# Patient Record
Sex: Female | Born: 1935 | Race: White | Hispanic: No | Marital: Married | Smoking: Never smoker
Health system: Southern US, Community
[De-identification: ages and names within clinical notes are randomized; demographics above are authoritative.]

## PROBLEM LIST (undated history)

## (undated) DIAGNOSIS — R51 Headache: Secondary | ICD-10-CM

## (undated) DIAGNOSIS — K219 Gastro-esophageal reflux disease without esophagitis: Secondary | ICD-10-CM

## (undated) DIAGNOSIS — IMO0001 Reserved for inherently not codable concepts without codable children: Secondary | ICD-10-CM

## (undated) DIAGNOSIS — E872 Acidosis: Secondary | ICD-10-CM

## (undated) DIAGNOSIS — H3530 Unspecified macular degeneration: Secondary | ICD-10-CM

## (undated) DIAGNOSIS — Z8719 Personal history of other diseases of the digestive system: Secondary | ICD-10-CM

## (undated) DIAGNOSIS — E162 Hypoglycemia, unspecified: Secondary | ICD-10-CM

## (undated) DIAGNOSIS — I10 Essential (primary) hypertension: Secondary | ICD-10-CM

## (undated) DIAGNOSIS — E039 Hypothyroidism, unspecified: Secondary | ICD-10-CM

## (undated) DIAGNOSIS — C73 Malignant neoplasm of thyroid gland: Secondary | ICD-10-CM

## (undated) DIAGNOSIS — G8929 Other chronic pain: Secondary | ICD-10-CM

## (undated) DIAGNOSIS — D649 Anemia, unspecified: Secondary | ICD-10-CM

## (undated) DIAGNOSIS — Z5189 Encounter for other specified aftercare: Secondary | ICD-10-CM

## (undated) DIAGNOSIS — I209 Angina pectoris, unspecified: Secondary | ICD-10-CM

## (undated) DIAGNOSIS — M1990 Unspecified osteoarthritis, unspecified site: Secondary | ICD-10-CM

## (undated) DIAGNOSIS — R0609 Other forms of dyspnea: Secondary | ICD-10-CM

## (undated) DIAGNOSIS — R0602 Shortness of breath: Secondary | ICD-10-CM

## (undated) DIAGNOSIS — T148XXA Other injury of unspecified body region, initial encounter: Secondary | ICD-10-CM

## (undated) DIAGNOSIS — M549 Dorsalgia, unspecified: Secondary | ICD-10-CM

## (undated) DIAGNOSIS — J189 Pneumonia, unspecified organism: Secondary | ICD-10-CM

## (undated) DIAGNOSIS — M869 Osteomyelitis, unspecified: Secondary | ICD-10-CM

## (undated) HISTORY — PX: TRIGGER FINGER RELEASE: SHX641

## (undated) HISTORY — PX: SHOULDER ARTHROSCOPY W/ ROTATOR CUFF REPAIR: SHX2400

## (undated) HISTORY — PX: BACK SURGERY: SHX140

## (undated) HISTORY — PX: FRACTURE SURGERY: SHX138

## (undated) HISTORY — PX: APPENDECTOMY: SHX54

## (undated) HISTORY — PX: DILATION AND CURETTAGE OF UTERUS: SHX78

## (undated) HISTORY — PX: CARPAL TUNNEL RELEASE: SHX101

## (undated) HISTORY — PX: CATARACT EXTRACTION W/ INTRAOCULAR LENS  IMPLANT, BILATERAL: SHX1307

## (undated) HISTORY — PX: OTHER SURGICAL HISTORY: SHX169

## (undated) HISTORY — PX: JOINT REPLACEMENT: SHX530

## (undated) HISTORY — PX: TONSILLECTOMY AND ADENOIDECTOMY: SUR1326

---

## 1961-09-03 HISTORY — PX: ABDOMINAL HYSTERECTOMY: SHX81

## 1984-09-03 DIAGNOSIS — C73 Malignant neoplasm of thyroid gland: Secondary | ICD-10-CM

## 1984-09-03 HISTORY — DX: Malignant neoplasm of thyroid gland: C73

## 1984-09-03 HISTORY — PX: THYROIDECTOMY: SHX17

## 1998-08-10 ENCOUNTER — Encounter: Admission: RE | Admit: 1998-08-10 | Discharge: 1998-09-08 | Payer: Self-pay | Admitting: Orthopedic Surgery

## 1999-03-30 ENCOUNTER — Inpatient Hospital Stay (HOSPITAL_COMMUNITY): Admission: EM | Admit: 1999-03-30 | Discharge: 1999-04-07 | Payer: Self-pay | Admitting: Internal Medicine

## 1999-04-01 ENCOUNTER — Encounter: Payer: Self-pay | Admitting: Endocrinology

## 1999-04-24 ENCOUNTER — Encounter: Admission: RE | Admit: 1999-04-24 | Discharge: 1999-07-23 | Payer: Self-pay | Admitting: Anesthesiology

## 2000-07-05 ENCOUNTER — Encounter: Admission: RE | Admit: 2000-07-05 | Discharge: 2000-07-05 | Payer: Self-pay | Admitting: Orthopedic Surgery

## 2000-07-05 ENCOUNTER — Encounter: Payer: Self-pay | Admitting: Orthopedic Surgery

## 2000-09-25 ENCOUNTER — Ambulatory Visit (HOSPITAL_COMMUNITY): Admission: RE | Admit: 2000-09-25 | Discharge: 2000-09-25 | Payer: Self-pay | Admitting: Neurosurgery

## 2000-09-25 ENCOUNTER — Encounter: Payer: Self-pay | Admitting: Neurosurgery

## 2004-03-10 ENCOUNTER — Inpatient Hospital Stay (HOSPITAL_COMMUNITY): Admission: RE | Admit: 2004-03-10 | Discharge: 2004-03-14 | Payer: Self-pay | Admitting: Orthopedic Surgery

## 2004-03-19 ENCOUNTER — Emergency Department (HOSPITAL_COMMUNITY): Admission: EM | Admit: 2004-03-19 | Discharge: 2004-03-19 | Payer: Self-pay | Admitting: Emergency Medicine

## 2004-04-24 ENCOUNTER — Encounter: Admission: RE | Admit: 2004-04-24 | Discharge: 2004-05-18 | Payer: Self-pay | Admitting: Orthopedic Surgery

## 2005-02-12 ENCOUNTER — Encounter: Admission: RE | Admit: 2005-02-12 | Discharge: 2005-02-12 | Payer: Self-pay | Admitting: Orthopedic Surgery

## 2005-03-09 ENCOUNTER — Ambulatory Visit: Payer: Self-pay | Admitting: Internal Medicine

## 2005-03-09 ENCOUNTER — Inpatient Hospital Stay (HOSPITAL_COMMUNITY): Admission: AD | Admit: 2005-03-09 | Discharge: 2005-03-13 | Payer: Self-pay | Admitting: Orthopedic Surgery

## 2005-03-10 ENCOUNTER — Encounter (INDEPENDENT_AMBULATORY_CARE_PROVIDER_SITE_OTHER): Payer: Self-pay | Admitting: *Deleted

## 2005-04-13 ENCOUNTER — Ambulatory Visit: Payer: Self-pay | Admitting: Infectious Diseases

## 2005-05-17 ENCOUNTER — Encounter: Admission: RE | Admit: 2005-05-17 | Discharge: 2005-05-17 | Payer: Self-pay | Admitting: Orthopedic Surgery

## 2005-06-06 ENCOUNTER — Encounter: Admission: RE | Admit: 2005-06-06 | Discharge: 2005-06-06 | Payer: Self-pay | Admitting: Orthopedic Surgery

## 2005-06-14 ENCOUNTER — Ambulatory Visit: Payer: Self-pay | Admitting: Infectious Diseases

## 2005-12-28 ENCOUNTER — Inpatient Hospital Stay (HOSPITAL_COMMUNITY): Admission: RE | Admit: 2005-12-28 | Discharge: 2005-12-29 | Payer: Self-pay | Admitting: Neurosurgery

## 2006-03-09 ENCOUNTER — Encounter: Admission: RE | Admit: 2006-03-09 | Discharge: 2006-03-09 | Payer: Self-pay | Admitting: Neurosurgery

## 2006-09-03 DIAGNOSIS — T148XXA Other injury of unspecified body region, initial encounter: Secondary | ICD-10-CM

## 2006-09-03 HISTORY — DX: Other injury of unspecified body region, initial encounter: T14.8XXA

## 2006-09-23 ENCOUNTER — Encounter: Admission: RE | Admit: 2006-09-23 | Discharge: 2006-09-23 | Payer: Self-pay | Admitting: Orthopedic Surgery

## 2006-11-18 ENCOUNTER — Encounter: Admission: RE | Admit: 2006-11-18 | Discharge: 2006-12-17 | Payer: Self-pay | Admitting: Orthopedic Surgery

## 2007-01-28 ENCOUNTER — Ambulatory Visit: Payer: Self-pay | Admitting: Physical Medicine & Rehabilitation

## 2007-01-28 ENCOUNTER — Encounter
Admission: RE | Admit: 2007-01-28 | Discharge: 2007-04-28 | Payer: Self-pay | Admitting: Physical Medicine & Rehabilitation

## 2007-02-10 ENCOUNTER — Encounter: Admission: RE | Admit: 2007-02-10 | Discharge: 2007-02-10 | Payer: Self-pay | Admitting: Cardiology

## 2007-02-17 ENCOUNTER — Inpatient Hospital Stay (HOSPITAL_COMMUNITY): Admission: AD | Admit: 2007-02-17 | Discharge: 2007-02-18 | Payer: Self-pay | Admitting: Cardiology

## 2007-02-20 ENCOUNTER — Encounter: Admission: RE | Admit: 2007-02-20 | Discharge: 2007-02-20 | Payer: Self-pay | Admitting: Neurosurgery

## 2007-03-14 ENCOUNTER — Ambulatory Visit: Payer: Self-pay | Admitting: Physical Medicine & Rehabilitation

## 2007-04-29 ENCOUNTER — Inpatient Hospital Stay (HOSPITAL_COMMUNITY): Admission: RE | Admit: 2007-04-29 | Discharge: 2007-05-02 | Payer: Self-pay | Admitting: Neurosurgery

## 2007-06-02 ENCOUNTER — Encounter
Admission: RE | Admit: 2007-06-02 | Discharge: 2007-06-04 | Payer: Self-pay | Admitting: Physical Medicine & Rehabilitation

## 2007-06-02 ENCOUNTER — Ambulatory Visit: Payer: Self-pay | Admitting: Physical Medicine & Rehabilitation

## 2007-08-21 ENCOUNTER — Encounter
Admission: RE | Admit: 2007-08-21 | Discharge: 2007-08-22 | Payer: Self-pay | Admitting: Physical Medicine & Rehabilitation

## 2007-08-21 ENCOUNTER — Ambulatory Visit: Payer: Self-pay | Admitting: Physical Medicine & Rehabilitation

## 2007-10-06 ENCOUNTER — Encounter: Admission: RE | Admit: 2007-10-06 | Discharge: 2007-10-06 | Payer: Self-pay | Admitting: Neurosurgery

## 2007-10-16 ENCOUNTER — Ambulatory Visit (HOSPITAL_COMMUNITY): Admission: RE | Admit: 2007-10-16 | Discharge: 2007-10-16 | Payer: Self-pay | Admitting: General Surgery

## 2007-10-22 ENCOUNTER — Ambulatory Visit (HOSPITAL_COMMUNITY): Admission: RE | Admit: 2007-10-22 | Discharge: 2007-10-22 | Payer: Self-pay | Admitting: Surgery

## 2007-11-21 ENCOUNTER — Ambulatory Visit: Payer: Self-pay | Admitting: Vascular Surgery

## 2007-12-10 ENCOUNTER — Encounter
Admission: RE | Admit: 2007-12-10 | Discharge: 2007-12-15 | Payer: Self-pay | Admitting: Physical Medicine & Rehabilitation

## 2007-12-15 ENCOUNTER — Ambulatory Visit: Payer: Self-pay | Admitting: Physical Medicine & Rehabilitation

## 2008-04-08 ENCOUNTER — Encounter
Admission: RE | Admit: 2008-04-08 | Discharge: 2008-07-05 | Payer: Self-pay | Admitting: Physical Medicine & Rehabilitation

## 2008-04-12 ENCOUNTER — Ambulatory Visit: Payer: Self-pay | Admitting: Physical Medicine & Rehabilitation

## 2008-07-05 ENCOUNTER — Ambulatory Visit: Payer: Self-pay | Admitting: Physical Medicine & Rehabilitation

## 2008-09-24 ENCOUNTER — Encounter
Admission: RE | Admit: 2008-09-24 | Discharge: 2008-10-20 | Payer: Self-pay | Admitting: Physical Medicine & Rehabilitation

## 2008-09-27 ENCOUNTER — Ambulatory Visit: Payer: Self-pay | Admitting: Physical Medicine & Rehabilitation

## 2008-10-20 ENCOUNTER — Encounter
Admission: RE | Admit: 2008-10-20 | Discharge: 2008-10-27 | Payer: Self-pay | Admitting: Physical Medicine & Rehabilitation

## 2008-10-27 ENCOUNTER — Ambulatory Visit: Payer: Self-pay | Admitting: Physical Medicine & Rehabilitation

## 2008-12-01 ENCOUNTER — Encounter: Admission: RE | Admit: 2008-12-01 | Discharge: 2008-12-01 | Payer: Self-pay | Admitting: Orthopaedic Surgery

## 2010-01-01 DIAGNOSIS — J189 Pneumonia, unspecified organism: Secondary | ICD-10-CM

## 2010-01-01 HISTORY — DX: Pneumonia, unspecified organism: J18.9

## 2011-01-01 ENCOUNTER — Telehealth: Payer: Self-pay | Admitting: Cardiology

## 2011-01-01 NOTE — Telephone Encounter (Signed)
Patient is going to have total knee replacement and her orthopaedic surgeon wants her to have pre-surgical/cardiac clearance.  Dr. Deborah Chalk does not have any openings prior to 01/12/11 and she says that she needs it sooner than that.

## 2011-01-01 NOTE — Telephone Encounter (Signed)
RN set pt up to see Dr. Deborah Chalk on 01/04/11 for cardiac clearance for surgery.  Pt notified.

## 2011-01-04 ENCOUNTER — Encounter: Payer: Self-pay | Admitting: Cardiology

## 2011-01-04 ENCOUNTER — Ambulatory Visit (INDEPENDENT_AMBULATORY_CARE_PROVIDER_SITE_OTHER): Payer: Medicare Other | Admitting: Cardiology

## 2011-01-04 VITALS — BP 116/56 | HR 58 | Ht 63.5 in | Wt 145.0 lb

## 2011-01-04 DIAGNOSIS — Z01818 Encounter for other preprocedural examination: Secondary | ICD-10-CM

## 2011-01-04 DIAGNOSIS — I10 Essential (primary) hypertension: Secondary | ICD-10-CM

## 2011-01-04 NOTE — Progress Notes (Signed)
Subjective:   [name] seen today for preoperative visit. She has a planned right knee surgery by Dr. Madelon Lips. In general she has been doing well otherwise. She had cardiac catheterization in June of 2008 which showed normal coronary arteries and normal left ventricular function. She's had a history of hypertension as been well-controlled. She has history of chronic lower extremity edema, previous thyroidectomy in 1963 and 68, and previous abdominal hysterectomy. She has had other orthopedic procedures. In general, she is doing well. She did have back surgery in 2000 at 12 and tolerated that surgery well. EKG today is normal  No current outpatient prescriptions on file.    Not on File  There is no problem list on file for this patient.   History  Smoking status  . Never Smoker   Smokeless tobacco  . Never Used    History  Alcohol Use No    No family history on file.  Review of Systems:   The patient denies any heat or cold intolerance.  No weight gain or weight loss.  The patient denies headaches or blurry vision.  There is no cough or sputum production.  The patient denies dizziness.  There is no hematuria or hematochezia.  The patient denies any muscle aches or arthritis.  The patient denies any rash.  The patient denies frequent falling or instability.  There is no history of depression or anxiety.  All other systems were reviewed and are negative.   Physical Exam:   Weight is 145. Blood pressure is 116/56 sitting, heart rate 58.The head is normocephalic and atraumatic.  Pupils are equally round and reactive to light.  Sclerae nonicteric.  Conjunctiva is clear.  Oropharynx is unremarkable.  There's adequate oral airway.  Neck is supple there are no masses.  Thyroid is not enlarged.  There is no lymphadenopathy.  Lungs are clear.  Chest is symmetric.  Heart shows a regular rate and rhythm.  S1 and S2 are normal.  There is no murmur click or gallop.  Abdomen is soft normal bowel  sounds.  There is no organomegaly.  Genital and rectal deferred.  Extremities are without edema.  Peripheral pulses are adequate.  Neurologically intact.  Full range of motion.  The patient is not depressed.  Skin is warm and dry.  Assessment / Plan:

## 2011-01-05 ENCOUNTER — Encounter: Payer: Self-pay | Admitting: Cardiology

## 2011-01-05 DIAGNOSIS — I10 Essential (primary) hypertension: Secondary | ICD-10-CM | POA: Insufficient documentation

## 2011-01-05 NOTE — Assessment & Plan Note (Signed)
Blood pressure readings are well controlled. She should be a good candidate for planned knee surgery. Forms were filled out and will be faxed to Dr. Madelon Lips . Surgical clearance for anesthesia is given.

## 2011-01-16 NOTE — Group Therapy Note (Signed)
REFERRAL:  Dr. Madelon Lips.   PURPOSE OF EVALUATION:  Evaluating to treat chronic back and leg pain.   HISTORY OF PRESENT ILLNESS:  Ms. Hilyer is a 77 year old female referred  to this office by Dr. Madelon Lips for chronic pain management, mostly  regarding her back and leg pain.  The patient has a significant history  of full thickness tear of her right rotator cuff.  She has had  arthroscopic surgery for that in March 2008 and that apparently went  well.   Her problems involving her back and legs started after a left total knee  replacement in 2005.  She reports that she has developed pain in the  back side of her left leg after surgery and a repeat surgery was done in  2006 when she had her knee cleaned out.  She reports being on  antibiotics for 4 months at that time.   The patient followed up with Dr. Channing Mutters, her neurosurgeon in 2007.  At that  time a MRI scan had shown spinal stenosis and Dr. Channing Mutters did spinal surgery  for the stenosis, but did not do a fusion and that was in 2007.  She  reports that this surgery for her back did help, but she has had  followup with Dr. Channing Mutters and has had persistent back pain along with the  left leg pain as noted above.  She reports that those are the main two  areas of her pain at the present time.   The patient reports having had extensive therapy for her back and leg  pain along with injections without improvement.  She reports that she  had been rotating Vicodin and Percocet in the past and was getting  relief with the Percocet.  She reports that she uses that occasionally  more than the Vicodin that she has had in the past.  She reports that  she takes the Percocet at approximately once in the morning and possibly  1 or 2 more times throughout the day.  She reports that no surgery is  planned for her back.  She complains of the pain medicine giving her  some relieve used approximately 2-4 tablets per day.  She complains of  spasms of her leg along with  pain across the low back.  She reports  stretching gets her no benefit.  She is unable to use muscle relaxants  related to glaucoma, especially taking Flexeril.  She reports some  constipation with the narcotic medication.   MEDICATIONS:  1. Synthroid 100 mcg daily.  2. Inderal 20 mg daily.  3. Maxzide 25 mg daily.  4. Potassium 10 mEq daily.  5. Percocet 5/325 mg 1 tablet 2-3 times a day.  6. Laxatives p.r.n.  7. Nitrostat 0.4 mg p.r.n.  8. Xalatan eye drops daily.   REVIEW OF SYSTEMS:  Positive for low blood sugar, weight gain,  constipation, limb swelling, urinary retention and abdominal pain.   PAST MEDICAL HISTORY:  1. History of left total knee replacement 2005.  2. History of lumbar spinal stenosis surgery in 2007.  3. Advanced glaucoma.  4. Hypertension.  5. Hypothyroidism after thyroid surgery for a thyroid cancer.  6. History of hypoglycemia.   ALLERGIES:  PENICILLIN, TETRACYCLINE, ATIVAN, DEMEROL.   SOCIAL HISTORY:  The patient is married with one grown daughter.  She  does not use alcohol or tobacco.  She had previously worked in a [employer] until 1969 and has been retired for several years.   PHYSICAL EXAMINATION:  A well appearing elderly adult female in mild to  moderate acute discomfort. Blood pressure 154/63 with a pulse of 63,  respiratory rate 18 and a O2 saturation 99% on room air.  Height was 5  feet 5 inches, weight 150 pounds.  The patient is able to ambulate  without any assisted device.  Upper extremity range of motion was full  with only mild complaints of pain in the right shoulder.  Examination of  her lumbar spine showed decreased lumbar flexion, extension and lateral  bending and rotation.  She has a well healed scar on her right shoulder.  Upper extremity exam showed 4+/5 strength throughout.  Bulk and tone  were normal.  Lower extremity exam showed 4+/5 strength in hip flexion,  knee extension and ankle dorsiflexion.  In the supine  position, straight  leg raise was negative bilaterally.  Hip range of motion was normal  bilaterally.   IMPRESSION:  1. History of spinal stenosis with subsequent lumbar surgery.  2. History of left total knee replacement with persistent left leg      pain.   In the office today we did decide to continue her Percocet and actually  allow her to use 10/325 mg strength one tablet q.i.d. p.r.n.  She will  continue to use it only as absolutely necessary.  Hopefully she will get  better relief with the 10 mg strength compared to the 5 mg strength.  I  feel confident that she will not abuse the medication.  She understands  that all medicines need to be prescribed through this office.  We will  plan on seeing her in followup in approximately one months time and  adjust medicines as necessary at that point.  We have also given her  samples of Lidoderm patches 5% to be applied to the back on 12 hours and  off 12 hours daily.  We will see how she responds to the samples and  then we will refill those if she gets benefit.           ______________________________  Ellwood Dense, M.D.     DC/MedQ  D:  01/30/2007 09:47:48  T:  01/30/2007 10:25:16  Job #:  [id number]   cc:   Dyke Brackett, M.D.  Fax: [phone]

## 2011-01-16 NOTE — Discharge Summary (Signed)
[name], [name]               ACCOUNT NO.:  [id number]   MEDICAL RECORD NO.:  [id number]          PATIENT TYPE:  INP   LOCATION:  5153                         FACILITY:  [facility]   PHYSICIAN:  Payton Doughty, M.D.      DATE OF BIRTH:  1925/04/19   DATE OF ADMISSION:  04/29/2007  DATE OF DISCHARGE:  05/02/2007                               DISCHARGE SUMMARY   ADMITTING DIAGNOSIS:  Spondylosis, L3-4.   DISCHARGE DIAGNOSES:  1. Spondylosis, L3-4.  2. Bilateral pars fractures at L3.   COMPLICATION:  None.   PROCEDURE:  L3-4 fusion.   SERVICE:  Neurosurgery.   BODY OF TEXT:  This is a 77 year old lady who has had a prior  decompression at 3-4, did well for a while and after a fall, had  increasing pain down both legs.  She was admitted for fusion.   MEDICAL HISTORY:  Benign.   PHYSICAL EXAMINATION:  General exam was intact.  Neurologic exam was  intact with a lot of pain when she got up.   HOSPITAL COURSE:  She was admitted after ascertainment of normal  laboratory values and underwent a lumbar fusion at 3-4.  Intraoperatively it was found that she had bilateral fracture of the  pars interarticularis.  Following complete decompression and  stabilization postoperatively, her lower extremities pain was gone.  She  had an appropriate amount of incisional back pain.  She spent 2 days on  a PCA.  Once the PCA was stopped, she was on oral medications, eating  and voiding normally, doing well in physical therapy.  She is being  discharged home to the care of her family.   FOLLOWUP:  Her followup will be [facility] in a week for sutures.   .           ______________________________  Payton Doughty, M.D.     MWR/MEDQ  D:  05/02/2007  T:  05/03/2007  Job:  [id number]

## 2011-01-16 NOTE — Assessment & Plan Note (Signed)
Ms. Bollen returns to the clinic today for followup evaluation.  She  reports that she still is not satisfied with the pain relief that she is  getting from the oxycodone.  She has been using generally 5 times per  day 2 tablets at a time.  She has talked over other pain medicines with  her pharmacist and we have discussed use of fentanyl patch in the past.  She is willing to consider a trial of fentanyl patches at 25 mcg  strength, change q.72 h., in addition to her oxycodone.  She still is  not interested in a spinal stimulator that had been discussed with Dr.  Channing Mutters in the past.  She reports that she is only able to stand  approximately 20 minutes or so before she needs to sit due to her back  pain.   MEDICATIONS:  1. Synthroid 100 mcg p.o. daily.  2. Inderal 20 mg daily.  3. Maxzide 25 mg daily.  4. Potassium chloride 10 mEq daily.  5. Oxycodone 5 mg 1-2 tablets 5 times per day p.r.n. (8-10 per day).  6. Nitrostat 0.4 mg p.r.n.  7. Xalatan eye drops daily.  8. Valium 5 mg one-half tablet 1 tablet nightly p.r.n.   REVIEW OF SYSTEMS:  Positive for constipation, limb swelling, low blood  sugar, and weight gain.   PHYSICAL EXAMINATION:  GENERAL:  Elderly, well-appearing adult female in  moderate acute discomfort.  VITAL SIGNS:  Blood pressure was 129/64 with a pulse of 60, respiratory  rate 18, and O2 saturation 97% on room air.  EXTREMITIES:  She has 4+/5 strength throughout.  She ambulates without  any assisted device.   IMPRESSION:  1. Status post recent open reduction and internal fixation of lumbar      spine for lumbar fracture x2.  2. History of spinal stenosis with subsequent lumbar surgery in 2007.  3. Left total knee replacement with persistent left leg pain.   In the office today, we did start the patient on fentanyl patch 25 mcg  per hour, change q.72 h.  She has a good understanding of how the  medicine works and how she is to change the patch.  We also refilled  her  oxycodone in the office today.  We will plan on seeing her in followup  in approximately 3 months' time and she will call this office if she is  unable to tolerate the fentanyl patch.           ______________________________  Ellwood Dense, M.D.     DC/MedQ  D:  07/05/2008 11:18:50  T:  07/06/2008 01:35:06  Job #:  [id number]

## 2011-01-16 NOTE — H&P (Signed)
[name], [name]               ACCOUNT NO.:  [id number]   MEDICAL RECORD NO.:  [id number]          PATIENT TYPE:  INP   LOCATION:  3172                         FACILITY:  [facility]   PHYSICIAN:  Payton Doughty, M.D.      DATE OF BIRTH:  1924/11/11   DATE OF ADMISSION:  04/29/2007  DATE OF DISCHARGE:                              HISTORY & PHYSICAL   ADMISSION DIAGNOSIS:  Spondylosis L3-4.   A very nice now 77 year old right-handed white lady who has had 3-4 and  4-5 laminotomy, foraminotomy done about a year and one-half ago. Did  reasonably well with that for awhile.  She has had increasing pain in  her back and down her legs.  Repeat MR shows significant degenerative  change and slip at 3-4, and she is admitted for a 3-4 facetectomy and  fusion.   MEDICAL HISTORY:  Benign.   MEDICATIONS:  She is on Synthroid, Maxzide and Vicodin.   ALLERGIES:  She is allergic to PENICILLIN, TETRACYCLINE, DEMEROL, and  ATIVAN.   SURGICAL HISTORY:  Spine operation in the past and a thyroid operation  in 1986.   SOCIAL HISTORY:  She does not smoke or drink and is retired.  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   FAMILY HISTORY:  Parents are deceased; history is not given.  She has a  brother with diabetes, bladder cancer and Parkinson's disease.   REVIEW OF SYSTEMS:  Remarkable for glasses, glaucoma, chest pain, leg  pain, back pain, arthritis, abdominal pain.   PHYSICAL EXAMINATION:  HEENT:  Exam normal limits.  NECK:  She has good range of motion of the neck.  CHEST:  Clear.  CARDIAC:  Regular rate and rhythm.  ABDOMEN:  Nontender with no hepatosplenomegaly.  EXTREMITIES:  Without clubbing, cyanosis.  GU:  Exam is deferred.  PULSES:  Peripheral pulses are good.  NEUROLOGIC:  She is awake, alert and oriented.  Cranial nerves are  intact.  Motor exam shows 5/5 strength throughout the upper and lower  extremities.  Sensory dysesthesias Described in L4-L5 distribution. Deep  tendon reflexes are  absent at knees and ankles.  Straight leg is  negative.   MR results have been reviewed above.  Basically show spondylosis at 3-4  and 4-5, much worse at 3-4 with compressive pathology at that level.   CLINICAL IMPRESSION:  L3-4 spondylosis with neurogenic claudication.   PLAN:  The plan is for laminectomy, diskectomy, posterior lumbar  interbody fusion, threaded fusion cages, and nonsegmental pedicle screws  in 3-4. The risks and benefits of this approach have been discussed with  her, and she wishes to proceed.    .           ______________________________  Payton Doughty, M.D.     MWR/MEDQ  D:  04/29/2007  T:  04/29/2007  Job:  [id number]

## 2011-01-16 NOTE — Op Note (Signed)
Sheila [name], [name]               ACCOUNT NO.:  [id number]   MEDICAL RECORD NO.:  [id number]          PATIENT TYPE:  AMB   LOCATION:  DAY                          FACILITY:  [facility]   PHYSICIAN:  Thomas A. Cornett, M.D.DATE OF BIRTH:  1925/03/04   DATE OF PROCEDURE:  10/22/2007  DATE OF DISCHARGE:  10/22/2007                               OPERATIVE REPORT   PREOPERATIVE DIAGNOSES:  Anal stenosis.   POSTOPERATIVE DIAGNOSES:  Anal stenosis.   PROCEDURE.:  1. Exam under anesthesia with dilation of anal canal.  2. Closed lateral internal sphincterotomy.   SURGEON:  Harriette Bouillon, MD.   ANESTHESIA:  LMA with 10 mL of 0.25% Sensorcaine perianal block.   ESTIMATED BLOOD LOSS:  10  mL.   SPECIMEN:  None.   INDICATIONS FOR PROCEDURE:  The patient is an 76 year old female whose  had problems with chronic constipation and anal stenosis.  She was seen  by Dr. Kendrick Ranch who had scheduled her for anal dilation in the  operating room but unfortunately he became ill, this had to be canceled  and I was asked to see her in his absence.  I saw her and concur with  his diagnosis.  She presents today for anal dilation due to chronic  constipation.  She had a history of hemorrhoid disease in the past  undergoing hemorrhoidectomy many years ago and this is apparently what  this stems from.   We discussed complications of bleeding, infection, incontinence and  recurrence.  She also takes chronic pain medicine which will make her  constipation worse. This was all discussed with her and she agreed to  proceed.   DESCRIPTION OF PROCEDURE:  The patient was brought to the operating  room, placed supine.  After LMA anesthesia, she was placed in lithotomy,  appropriately padded and perineum was prepped and draped in a sterile  fashion.  Initially I could not insert a finger on digital examination.  I then used sequential Hagar dilators and the largest dilator passed  easily through her anal canal.  I  was then able to put my index finger  through the anal much easier.  We then introduced a proctoscope and  stretched the anal canal with that. I then used a larger proctoscope,  placed the proctoscope in the anal canal and dilated this up to about 2  fingers without difficulty.  I then was able to examine her with the  proctoscope.  She had some mild internal hemorrhoids.  There was a tear  in the mucosa in the posterior midline from our stretching it. The  internal sphincter was quite rigid.  There was a large conglomerate of  stool in the distal rectum that I was able to pull out of my finger. It  was very hard and almost concrete like. Given the fact that her  sphincter was quite spastic, I performed a right lateral closed internal  sphincterotomy.  The intersphincteric groove was then identified. I was  then able to slide an 11 blade into the anterior intersphincteric  groove, turn the blade toward the internal sphincter and cut  toward the  internal opening releasing the internal sphincter. This relaxed the anal  canal even more so. I left the anoscope in for about 5 minutes to  thoroughly stretch this out. This was stretched so I could easily insert  two fingers. We then irrigated out the rectum to remove any fecal  contents.  I did not see any evidence of a stricture or tumor above the  proctoscope.  Hemostasis was achieved with pressure.  I used 10 mL of  0.25% Sensorcaine, injected in a perianal arrangement.  I placed Gelfoam  in the anal canal and dry dressings were applied.  All final counts of  sponge, needle and instruments were found to be correct at this portion  of the case.  The patient was awoke, taken out of lithotomy and taken to  recovery in satisfactory condition.      Thomas A. Cornett, M.D.  Electronically Signed     TAC/MEDQ  D:  10/22/2007  T:  10/23/2007  Job:  [id number]

## 2011-01-16 NOTE — H&P (Signed)
NAMESOMMER, SPICKARD               ACCOUNT NO.:  [id number]   MEDICAL RECORD NO.:  [id number]          PATIENT TYPE:  INP   LOCATION:  2013                         FACILITY:  [facility]   PHYSICIAN:  Colleen Can. Deborah Chalk, M.D.DATE OF BIRTH:  1924/12/09   DATE OF PROCEDURE:  DATE OF DISCHARGE:                    STAT - MUST CHANGE TO CORRECT WORK TYPE   CHIEF COMPLAINT:  Chest pain.   HISTORY OF PRESENT ILLNESS:  Ms. Counihan is a very pleasant 77 year old  female who was referred for admission and subsequent cardiac  catheterization.  She was first seen in our office towards the earlier  part of this month for evaluation of chest pain.  She has had a chest  discomfort that tends to radiate to the left jaw and has had a history  of this really over the past 25 years or so.  She had a negative  Cardiolite study in 2005 as well as a previous study in 2003 that served  as preoperative clearance.  She was referred for a repeat Cardiolite  study, and adenosine Cardiolite was carried out on June 6.  With this,  her ejection fraction was 81%.  There was no evidence of ischemia. She  had no wall motion abnormality.   Today on February 17, 2007, she comes to the office as a work-in  appointment.  She had a prolonged episode of chest discomfort last  evening described as a pressure-like sensation.  Once again, it radiates  up into the jaw.  She has had significant nausea but no actual vomiting.  She was not short of breath.  She took nitroglycerin with prompt  resolution.  Today she had recurrent episodes and has taken a total of 3  nitroglycerin.  She is now referred for admission with plans for cardiac  catheterization.   PAST MEDICAL HISTORY:  1. Longstanding history of chest pain.  2. Recent URI.  3. Hypertension.  4. Thyroidectomy in 1963 and 1966.  5. History of abdominal hysterectomy in 1960.  6. Appendectomy.  7. Hemorrhoidectomy.  8. Bilateral carpal tunnel surgery.  9. Left rotator cuff  surgery.  10.Arthroscopy of the right knee in 2300.  11.Childbirth x1.   ALLERGIES:  PENICILLIN, TETRACYCLINE, DEMEROL, ATIVAN.   CURRENT MEDICATIONS:  1. Synthroid 100 mcg a day.  2. Inderal 20 mg b.i.d.  3. Ocuvite 2 times a day.  4. Maxzide, dose unknown.  5. Nitroglycerin p.r.n.   FAMILY HISTORY:  Father died at age 37 of a stroke.  Mother died at 47  with cerebral hemorrhage.   SOCIAL HISTORY:  She is married.  She has no alcohol or tobacco use.   REVIEW OF SYSTEMS:  As noted above, otherwise unremarkable.   PHYSICAL EXAMINATION:  GENERAL:  She is a pleasant white female who  appears somewhat younger than her stated age.  VITAL SIGNS:  Blood pressure 120/68, heart rate 60.  Her weight is 151  pounds.  SKIN:  Warm and dry.  Color is unremarkable.  LUNGS: Clear.  HEART:  Regular rhythm.  ABDOMEN:  Soft.  EXTREMITIES:  Without edema.  NEUROLOGIC:  No gross focal deficits.  LABORATORY DATA:  Pertinent labs are pending.   EKG shows sinus rhythm with no acute changes.   OVERALL IMPRESSION:  1. Prolonged episode of chest pain.  2. Recent negative Cardiolite study.  3. Hypertension.  4. Hypothyroidism.   PLAN:  1. Will proceed on with admission to the hospital.  2. Cardiac panels will be drawn in a serial fashion.  3. She will be placed on IV nitroglycerin and IV heparin.  4. We will proceed on with cardiac catheterization in the morning.      Procedure, risks, and benefits have all been explained, and she is      willing to proceed on February 18, 2007.      Sharlee Blew, N.P.      Colleen Can. Deborah Chalk, M.D.  Electronically Signed    LC/MEDQ  D:  02/17/2007  T:  02/17/2007  Job:  [id number]   cc:   Alfonse Alpers. Dagoberto Ligas, M.D.

## 2011-01-16 NOTE — Assessment & Plan Note (Signed)
[name] returns to clinic today for followup evaluation.  We last saw  her in this office on July 05, 2008.  At that time, she was not  getting much relief from her oxycodone, and we decided to start her on a  fentanyl patch at 25 mcg per hour, change q.72 h., in addition, to her  p.r.n. oxycodone.  She reports that after using the patch for  approximately a week, she noticed that she could not breathe.  Her  pharmacist told her that may be a side effect as she has reported  overheat to Dilaudid and there is some type of overlap.  The patient  reports that she stopped the fentanyl completely and has been using the  oxycodone approximately 10 tablets per day at a 5 mg strength.  She  reports only fair-to-minimal relief whatsoever for approximately a  couple of hours after each dose.  She reports spasms of her legs  especially at night and reports that she does use the Valium  periodically.  She does report that it helps to lean on a grocery cart  when she goes to the store.  She has tolerated IV morphine when she was  hospitalized in the past.  Dr. Channing Mutters has discussed possible surgery to  look at the hardware to see if it is pressing on her nerve, but she is  feeling not interested in any repeat surgery at this time.  She has had  2 friends who died after a spinal cord stimulator placement and she is  reluctant to even consider that option.  Dr. Channing Mutters apparently has given  her a 50:50% chance of that would be beneficial to her.   MEDICATIONS:  1. Synthroid 100 mcg p.o. daily.  2. Inderal 20 mg daily.  3. Maxzide 25 mg daily.  4. Potassium chloride 10 mEq daily.  5. Oxycodone 5 mg 1-2 tablets 5 times per day p.r.n. (8-10 per day).  6. Nitrostat 0.4 mg p.r.n.  7. Xalatan eye drops daily.  8. Valium 5 mg one-half tablet to 1 tablet nightly p.r.n.   REVIEW OF SYSTEMS:  Positive for weight gain, low blood sugar,  constipation, nausea, urinary retention, limb swelling, and shortness of  breath.   The patient reports the pain is interfering with her homemaking,  traveling, social life, sleeping, standing, sitting, walking, lifting  and personal care.   PHYSICAL EXAMINATION:  GENERAL:  Elderly well-appearing adult female in  moderate acute discomfort involving her back and bilateral legs.  VITAL SIGNS:  Blood pressure is 138/66 with a pulse of 60, respiratory  rate 18, and O2 saturation 95% on room air.  EXTREMITIES:  She has 4+/5 strength throughout.  She ambulates without  any assistive device.   IMPRESSION:  1. Status post recent open reduction and internal fixation of lumbar      spine, lumbar fracture x2.  2. History of spinal stenosis with subsequent lumbar surgery in 2007.  3. Left total knee replacement with persistent left leg pain.   In the office today, we did have the patient discontinue her oxycodone  and instead try morphine sulfate immediate release 30 mg b.i.d.  We will  plan on seeing the patient in followup in this office in approximately 1  month's time either with myself or with the nursing staff.           ______________________________  Ellwood Dense, M.D.     DC/MedQ  D:  09/27/2008 10:21:33  T:  09/27/2008 23:31:39  Job #:  [id number]

## 2011-01-16 NOTE — Op Note (Signed)
NAMEGRACYNN, [name]               ACCOUNT NO.:  [id number]   MEDICAL RECORD NO.:  [id number]          PATIENT TYPE:  INP   LOCATION:  3315                         FACILITY:  [facility]   PHYSICIAN:  Payton Doughty, M.D.      DATE OF BIRTH:  [birth year]   DATE OF PROCEDURE:  04/29/2007  DATE OF DISCHARGE:                               OPERATIVE REPORT   PREOPERATIVE DIAGNOSIS:  Spondylosis L3-4.   POSTOPERATIVE DIAGNOSIS:  Bilateral pars fractures L3 with spondylosis  L3-4.   PROCEDURE:  L3-4 laminectomy, facetectomy, nonsegmental pedicle screw  fixation and posterolateral arthrodesis.   SURGEON:  Payton Doughty, M.D.   SERVICE:  Neurosurgery.   ANESTHESIA:  General endotracheal.   PREP:  Betadine prep with alcohol wipe.   COMPLICATIONS:  None.   ASSISTANT:  Jenkins.   This is a 77 year old lady who had a prior decompression in March 2004  and now has a lot of back pain and pain in both legs.  Taken to  operating room and smoothly anesthetized and intubated, placed prone on  the operating table.  Following shave, prep and drape in the usual  sterile fashion, skin was incised over the lamina of L3, and the lamina  of L3 was isolated in the subperiosteal plane as well as the transverse  process of L3 and L4.  Intraoperative x-ray confirmed correctness of  level.  Dissecting to the scar, it was evident that the pars were  fractured bilaterally.  The loose inferior fragment was removed from the  facet joint at 3-4, and the superior fragment was drilled down along  with the superior facet of L4 to allow decompression of the lateral  recess as well as the 3 root as it traversed the area.  The 3 root came  off at a fairly acute angle, and it was not really feasible to place an  interbody device, so wide decompression was undertaken bilaterally.  After decompression, pedicle screws were placed in 3 and 4 and attached  to the rod and capped.  The transverse processes were decorticated with  a high-speed drill and packed with BMP on the extender matrix.  Intraoperative x-ray showed good placement of pedicle screws and rods.  Successive layers of 0 Vicryl, 2-0 Vicryl and 3-0 nylon were used to  close.  Betadine and Telfa dressing were applied and made occlusive with  OpSite.  The patient returned to recovery room in good condition.    .           ______________________________  Payton Doughty, M.D.     MWR/MEDQ  D:  04/29/2007  T:  04/30/2007  Job:  [id number]

## 2011-01-16 NOTE — Discharge Summary (Signed)
[name], [name]               ACCOUNT NO.:  [id number]   MEDICAL RECORD NO.:  [id number]          PATIENT TYPE:  INP   LOCATION:  2013                         FACILITY:  [facility]   PHYSICIAN:  Colleen Can. Deborah Chalk, M.D.DATE OF BIRTH:  1925-07-02   DATE OF ADMISSION:  02/17/2007  DATE OF DISCHARGE:  02/18/2007                               DISCHARGE SUMMARY   PRIMARY DISCHARGE DIAGNOSES:  Chest pain with subsequent elective  cardiac catheterization, with normal LV function and normal coronary  arteries documented.  Her chest discomfort is not felt to be cardiac in  origin.   SECONDARY DISCHARGE DIAGNOSES:  1. Recent upper respiratory infection.  2. Hypertension.  3. History of thyroidectomy.  4. Chronic chest pain syndrome.   HISTORY OF PRESENT ILLNESS:  The patient is very pleasant 77 year old  female who was referred for admission and subsequent cardiac  catheterization.  She had been seen in our office earlier in the month  for evaluation of chest pain.  She reports a longstanding history of  chest discomfort over the past 25 years or so, and intermittently she  has had negative Cardiolite studies.  She was referred for repeat  Cardiolite study which was performed on June 6.  This was unremarkable.  On the day of admission, she came to the office as a work-in  appointment.  She once again complained of a prolonged episode of chest  pain that basically kept her up the evening before.  She was not able to  sleep in her bed.  It was described as a pressure-like sensation, and it  radiated up into the jaw.  She had no significant nausea.  She did have  significant nausea but no actual vomiting.  She took nitroglycerin with  prompt resolution.  She had recurrence of her discomfort prior to her  office visit and had already taken a total of 3 nitroglycerin, prior to  her examination.  She was subsequent admitted from the office, with  plans for cardiac catheterization.   Please  see the dictated history and physical for further patient  presentation and profile.   LABORATORY DATA:  Her troponins were all negative.  She did have a peak  MB of  7.1.  Her CBC was normal.  TSH was low at 0.153.  CMET was  basically unremarkable.  Her BUN was 9, creatinine was 0.8.  BNP was 45.   HOSPITAL COURSE:  The patient was admitted electively.  She was placed  on IV nitroglycerin and IV heparin.  We proceeded on with cardiac  catheterization the following morning.  That procedure was tolerated  well, without any known complications.  The left main coronary was  normal.  The left circumflex is a dominant system and is somewhat  tortuous but has no atherosclerotic changes.  The left circumflex is  normal.  The LAD is a large vessel the crosses the apex.  There is a  large diagonal vessel in the mid-portion.  The distal portion of the LAD  and diagonal are tortuous, but there are no atherosclerotic changes  present.  The right coronary  artery is a moderate size but non-dominant  vessel, and it was normal as well.  Her ejection fraction was 70%.  Regional wall motion was normal.  Postprocedure, she was transferred  back to 2000.  IV nitroglycerin and IV heparin were discontinued.  Bedrest was completed, and she was subsequently discharged in the  evening, with further outpatient follow-up to occur.   DISCHARGE CONDITION:  Is stable.   DISCHARGE MEDICINES:  1. Synthroid 100 mcg a day.  2. Enteral 20 mg b.i.d.  3. Ocuvite two times a day.  4. Maxzide as she was taking before.   Will plan on a follow-up visit in the office.  She is to call if any  problems would arise in the interim.      Sharlee Blew, N.P.      Colleen Can. Deborah Chalk, M.D.  Electronically Signed    LC/MEDQ  D:  02/19/2007  T:  02/19/2007  Job:  [id number]   cc:   Alfonse Alpers. Dagoberto Ligas, M.D.  Colleen Can. Deborah Chalk, M.D.

## 2011-01-16 NOTE — Assessment & Plan Note (Signed)
Ms. Meditz returns to the clinic today for followup evaluation.  I first  and last saw the patient in this office Jan 29, 2007, for evaluation of  chronic back and leg pain.  At that time, we had started her on Percocet  10/325 one tablet q.i.d.  She reports that the expense is fairly  significant compared to the 5 mg strength that she was on previously.  She was getting a total of 60 from Dr. Madelon Lips prior to our office  visit.  She also reports that she has had a followup MRI scan with Dr.  Channing Mutters and that they are planning a possible fusion surgery.  She has to  follow up with him to discuss the surgery that he is recommending.   In terms of her drug screen, she forgot to report that she was taking  Valium on a sporadic basis.  She had turned up positive for  benzodiazepines and that explains that abnormality.   The patient would like to continue using the Oxycodone but would like to  have a strength that is less costly compared to the 10 mg that she has  been prescribed through this office.  I would also like to avoid the  excess Tylenol if at all possible.   MEDICATIONS:  1. Synthroid 100 mcg daily.  2. Inderal 20 mg daily.  3. Maxzide 25 mg daily.  4. Potassium chloride 10 mEq daily.  5. Oxycodone 10/325 one tablet q.i.d.  6. Nitro-Stat 0.4 mg p.r.n.  7. Xalatan eye drops daily.  8. Valium 5 mg one-half tablet p.o. q.h.s. p.r.n.   REVIEW OF SYSTEMS:  Positive for constipation, limb swelling, weight  gain, and low blood sugar.   PHYSICAL EXAMINATION:  GENERAL:  A well-appearing, elderly, adult female  in mild acute discomfort.  VITAL SIGNS:  Blood pressure 114/68 with a pulse of 15, respiratory rate  16, and O2 saturation 96% on room air.   She is 4+/5 strength throughout.  She ambulates without any assistive  device.   IMPRESSION:  1. History of spinal stenosis with subsequent lumbar surgery, 2007.  2. History of left total knee replacement with persistent left leg  pain.   In the office today, we did try a new script for her, specifically  Oxycodone 5 mg two tablets p.o. q.i.d.  This hopefully will be less  expensive than the 10 mg Oxycodone that we had prescribed previously and  also will be avoiding the excess Tylenol.  I doubt that she gets much  benefit from the Tylenol compared to the Oxycodone.  We will see how she  does on this medication and see her in followup in approximately two  months time.  She will follow up with Dr. Channing Mutters as noted above.           ______________________________  Ellwood Dense, M.D.     DC/MedQ  D:  03/17/2007 11:42:11  T:  03/17/2007 18:06:29  Job #:  [id number]

## 2011-01-16 NOTE — Assessment & Plan Note (Signed)
[name] returns to clinic today for followup evaluation.  Unfortunately, she is still getting inadequate relief from her oxycodone  used 1-2 approximately 5 times per day.  She still reports severe pain  in the back of her legs and her low back.  She reports that Dr. Channing Mutters, her  neurosurgeon, has referred her to a psychiatrist in [facility] to  determine if she has true pain, with possible use of a spinal stimulator  in the future.  The patient has cancelled that appointment and is not  willing to undergo that procedure at this point.  She does plan to  follow up with Dr. Madelon Lips to see if the prior left total knee  replacement has any part in the ongoing pain.  She is also limited by  expense and is unable to afford some medications.  She still reports  significant medication costs from her eye drops for her glaucoma.   MEDICATIONS:  1. Synthroid 100 mcg p.o. daily.  2. Inderal 20 mg daily.  3. Maxzide 25 mg daily.  4. Potassium chloride 10 mEq daily.  5. Oxycodone 5 mg 1 to 2 tablets 5 times per day p.r.n.  6. Nitrostat 0.4 mg p.r.n.  7. Xalatan eye drops daily.  8. Valium 5 mg one-half to one tablet p.o. nightly p.r.n.   REVIEW OF SYSTEMS:  Positive for weight gain, low blood sugar, and limb  swelling.   PHYSICAL EXAMINATION:  Well-appearing elderly adult female in mild-to-  moderate acute discomfort.  Vitals were not obtained.  She has 4+/5  strength throughout.  She ambulates without any assistive device.   IMPRESSION:  1. Status post recent open reduction and internal fixation of lumbar      spine for lumbar fracture x2.  2. History of spinal stenosis with subsequent lumbar surgery in 2007.  3. Left total knee replacement with persistent left leg pain.   In the office today, we did have a discussion regarding her pain  medicines and the expense of various medications.  Unfortunately, she  would probably not be able to afford fentanyl patches.  Likewise, she  would not be  able to afford OxyContin.  We have been left with  continuation of oxycodone but increased the dose to 10 mg 1 tablet 5  times per day p.r.n.  She is comfortable with that plan at the present  time.  She has undergone vascular studies of the lower extremities,  which ruled out blood clots.  She also reports that she is not  interested in the spinal stimulator suggested by Dr. Channing Mutters.   We will plan on seeing the patient in followup in this office in  approximately 3-4 months' time, with refills prior to that appointment.           ______________________________  Ellwood Dense, M.D.     DC/MedQ  D:  04/12/2008 11:41:21  T:  04/13/2008 01:50:32  Job #:  [id number]

## 2011-01-16 NOTE — Cardiovascular Report (Signed)
NAMECRISTIAN, GRIEVES               ACCOUNT NO.:  [id number]   MEDICAL RECORD NO.:  [id number]          PATIENT TYPE:  INP   LOCATION:  2013                         FACILITY:  [facility]   PHYSICIAN:  Colleen Can. Deborah Chalk, M.D.DATE OF BIRTH:  [birth year]   DATE OF PROCEDURE:  02/18/2007  DATE OF DISCHARGE:                            CARDIAC CATHETERIZATION   PROCEDURE:  Left heart catheterization with selective coronary  angiography and left ventricular angiography.   TYPE AND SITE OF ENTRY:  Percutaneous; right femoral artery.   CATHETERS:  The 6-French 4-curved Judkins right and left coronary  catheters and 6-French pigtail ventriculographic catheter.   CONTRAST MATERIAL:  Omnipaque.   MEDICATIONS GIVEN PRIOR PROCEDURE:  Valium 10 mg p.o.   MEDICATIONS GIVEN DURING PROCEDURE:  Versed 2 mg IV.   COMMENTS:  The patient tolerated the procedure well.   HEMODYNAMIC DATA:  The aortic pressure was 123/63.  LV was 154/6-19.  There was no aortic valve gradient noted on pullback.   ANGIOGRAPHIC DATA:  1. Left main coronary artery is normal.  2. Left circumflex:  The left circumflex is a dominant system.  It is      somewhat tortuous, but there are no atherosclerotic changes.  The      left circumflex is normal.  3. Left anterior descending:  The left anterior descending is a large      vessel that crosses the apex.  There is a large diagonal vessel in      the midportion.  The distal portion of the left anterior descending      and diagonal vessel are tortuous, but there are no atherosclerotic      changes present.  4. Right coronary artery:  The right coronary artery is a moderate-      sized, but nondominant vessel.  It is normal.   LEFT VENTRICULAR ANGIOGRAM:  Performed in the RAO position.  Overall  cardiac size and silhouette are normal.  The global ejection fraction is  estimated to be 70%.  Regional wall motion is normal.   OVERALL IMPRESSION:  1. Normal left ventricular  function.  2. Normal coronary arteries.      Colleen Can. Deborah Chalk, M.D.  Electronically Signed     SNT/MEDQ  D:  02/18/2007  T:  02/18/2007  Job:  [id number]   cc:   Alfonse Alpers. Dagoberto Ligas, M.D.

## 2011-01-16 NOTE — Assessment & Plan Note (Signed)
[name] returns to clinic today for follow-up evaluation.  She still  reports severe pain in her legs and back.  She has been back to see Dr.  Channing Mutters 08/21/07 and he reports that she has damaged nerves and is not  likely to get much better.  He did start her on Neurontin 300 mg q. day  and she reports that she started that medication but is concerned about  weight gain.  She has had side effects including weight gain with  numerous medication in the past such as Skelaxin and soma.  She was  unable to tolerate those medicines for that reason.  She continues to  take her oxycodone approximately 6-8 tablets per day but occasionally  has increased pain especially after 6 p.m. in the night when her pain is  worse.  She would like to have ability to use extra amounts of oxycodone  as needed.   MEDICATIONS:  1. Synthroid 100 mcg p.o. q. day.  2. Inderal 20 mg q. day.  3. Maxzide 25 mg q. day.  4. Potassium chloride 10 mEq q. day.  5. Oxycodone 5 mg 2 tablets q.i.d. p.r.n.  6. Nitrostat 0.4 mg p.r.n.  7. Xalatan eye drops daily.  8. Valium 5 mg 1/2 tablet p.o. q.h.s. p.r.n.   REVIEW OF SYSTEMS:  Positive for weight gain along with constipation.   PHYSICAL EXAMINATION:  A reasonably well appearing middle aged elderly  adult female seen in her regular chair with a lumbar corset brace in  place.  Blood pressure is 135/68 with pulse of 66, respiratory rate 18  and O2 saturation 98% on room air.  She has 4+/5 strength throughout.  She ambulates without any assistive device.   IMPRESSION:  1. Status post recent open reduction and internal fixation of lumbar      spine for lumbar fracture x2.  2. History of spinal stenosis with subsequent lumbar surgery in 2007.  3. Left total knee replacement with persistent left leg pain.  4. In the office today we did increase the patient's oxycodone to 5 mg      1-2 tablets p.o. 5 x per day which will give her some extra      flexibility during the day.   She tends to have most of her pain in      the evening but has been unable to tolerate some of the muscle      relaxing medications that have been tried.  She is also concerned      about weight gain on the Neurontin but is willing to try that for      at least the time being.  Will plan on seeing her in follow-up in      approximately 3 months time with refills prior to that appointment      as necessary.           ______________________________  Ellwood Dense, M.D.     DC/MedQ  D:  08/22/2007 13:46:25  T:  08/23/2007 13:47:06  Job #:  [id number]

## 2011-01-16 NOTE — Assessment & Plan Note (Signed)
HISTORY OF PRESENT ILLNESS:  Ms. [name] returns to the clinic today for  followup evaluation.  She reports an MRI scan was done of her back by  Dr. Channing Mutters recently.  They did not find any acute abnormality, although the  patient reports that Dr. Channing Mutters told her that she probably has some  arthritis and probably some nerve root compression, but they can not  find the source.  In any event, she does report some benefit from using  the hydrocodone, approximately 6-10 tabs per day.  She still has 6  remaining from her script filled 11/13/2007.  She also takes Neurontin  300 mg daily, along with use of a TENS unit and heating pad on her low  back.  Most of her pain is in her lumbar spine with radiation into the  back of her legs bilaterally.   MEDICATIONS:  1. Synthroid 100 mcg p.o. daily.  2. Inderal 20 mg daily.  3. Maxzide 25 mg daily.  4. Potassium chloride 10 mEq daily.  5. Oxycodone 5 mg 1-2 tabs 5 times per day p.r.n.  6. Nitrostat 0.4 mg p.r.n.  7. Xalatan eyedrops daily.  8. Valium 5 mg 1/2 to 1 tabs p.o. every night p.r.n.   REVIEW OF SYSTEMS:  Positive for weight gain, low blood sugar,  constipation, and limb swelling.   PHYSICAL EXAMINATION:  GENERAL:  A well-appearing, elderly female in  mild acute discomfort.  VITAL SIGNS:  Not obtained in the office today.  NEUROLOGIC:  She has 4+/5+ strength throughout.  She ambulates without  any assisted device.   IMPRESSION:  1. Status post recent open reduction with internal fixation of the      lumbar spine for a lumbar fracture x2.  2. History of spinal stenosis with subsequent lumbar surgery in 2007.  3. Left total knee replacement with persistent left leg pain.   NOTE:  In the office today we did refill the patient's oxycodone of  12/22/2007.  She reports she has sufficient supply of Neurontin at this  point.  She reports she is concerned about over using the medication but  does report she gets benefit when she takes it as  prescribed.   PLAN:  We will plan to the patient in followup in approximately three to  four months' time with refills prior to that appointment as necessary.           ______________________________  Ellwood Dense, M.D.     DC/MedQ  D:  12/15/2007 12:49:18  T:  12/15/2007 13:04:46  Job #:  [id number]

## 2011-01-16 NOTE — Assessment & Plan Note (Signed)
[name] returns to clinic today for followup evaluation.  I last saw  her in this office March 17, 2007.  Since that time, she was diagnosed  with lumbar fracture x2, and underwent back surgery with Dr. Channing Mutters, April 29, 2007.  She reports that sutures were removed approximately 10 days  later, and she was placed in a brace which is to stay in place until,  hopefully, next week when she follows up with Dr. Channing Mutters June 12, 2007.  She reports that the first 2 weeks after surgery her leg pain resolved,  but she has had increased pain, especially of her bilateral calves with  low back pain and thigh pain to a lesser degree.   The patient had been prescribed oxycodone 5 mg 2 tablets q.i.d. through  this office.  After surgery, Dr. Channing Mutters placed her Percocet 10/325 one  tablet 4 times a day as needed.  The patient reports that she notices  only minimal change from the prescribed medicines, and actually, the  only difference appears to be the Tylenol.  She would like to stay on  the oxycodone at this time with a refill.   MEDICATIONS:  1. Synthroid 100 mcg daily.  2. Inderal 20 mg daily.  3. Maxzide 25 mg daily.  4. Potassium chloride 10 mEq daily.  5. Oxycodone 5 mg 2 tablets p.o. q.i.d. p.r.n.  6. Nitrostat 0.4 mg p.r.n.  7. Xalatan eye drops daily.  8. Valium 5 mg 1/2 tablet p.o. nightly p.r.n.   REVIEW OF SYSTEMS:  Positive for low blood sugars, weight gain,  constipation, and limb swelling.   PHYSICAL EXAMINATION:  A reasonably well-appearing middle-aged to  elderly adult female seated in a regular chair with a lumbar corset  brace in place.  Blood pressure is 125/69 with a pulse of 58, respiratory rate 18, and O2  saturation 96% on room air.  She has 4+/5 strength throughout.  She ambulates without any assistive  device.   IMPRESSION:  1. Status post recent open reduction internal fixation of the lumbar      spine for lumbar fracture x2.  2. History of spinal stenosis with  subsequent lumbar surgery, 2007.  3. Left total knee replacement with persistent left leg pain.   In the office today we did refill the patient's oxycodone at 5 mg 2  tablets q.i.d. p.r.n.  We also started her on Skelaxin 800 mg 1/2 tablet  to 1 tablet p.o. q.8 h. p.r.n. for muscle spasms.  Will plan on seeing  her in followup in approximately 3 months' time with refills prior to  that appointment as necessary.           ______________________________  Ellwood Dense, M.D.     DC/MedQ  D:  06/04/2007 11:54:45  T:  06/04/2007 16:27:13  Job #:  [id number]

## 2011-03-05 ENCOUNTER — Other Ambulatory Visit (HOSPITAL_COMMUNITY): Payer: Medicare Other

## 2011-03-09 ENCOUNTER — Inpatient Hospital Stay (HOSPITAL_COMMUNITY): Admission: RE | Admit: 2011-03-09 | Payer: Medicare Other | Source: Ambulatory Visit | Admitting: Orthopedic Surgery

## 2011-03-23 ENCOUNTER — Telehealth: Payer: Self-pay | Admitting: Cardiology

## 2011-03-23 ENCOUNTER — Encounter (HOSPITAL_COMMUNITY)
Admission: RE | Admit: 2011-03-23 | Discharge: 2011-03-23 | Disposition: A | Discharge status: Home / Self Care | Payer: Medicare Other | Source: Ambulatory Visit | Attending: Orthopedic Surgery | Admitting: Orthopedic Surgery

## 2011-03-23 LAB — COMPREHENSIVE METABOLIC PANEL
AST: 23 U/L (ref 0–37)
Albumin: 4.2 g/dL (ref 3.5–5.2)
Alkaline Phosphatase: 83 U/L (ref 39–117)
BUN: 10 mg/dL (ref 6–23)
CO2: 35 mEq/L — ABNORMAL HIGH (ref 19–32)
Chloride: 95 mEq/L — ABNORMAL LOW (ref 96–112)
GFR calc Af Amer: >60 mL/min (ref >60)
GFR calc non Af Amer: >60 mL/min (ref >60)

## 2011-03-23 LAB — URINALYSIS, ROUTINE W REFLEX MICROSCOPIC
Bilirubin Urine: NEGATIVE
Glucose, UA: NEGATIVE mg/dL
Hgb urine dipstick: NEGATIVE
Ketones, ur: NEGATIVE mg/dL
Nitrite: NEGATIVE
Protein, ur: NEGATIVE mg/dL
Urobilinogen, UA: 0.2 mg/dL (ref 0.0–1.0)

## 2011-03-23 LAB — CBC
Hemoglobin: 14.2 g/dL (ref 12.0–15.0)
MCH: 32.3 pg (ref 26.0–34.0)
MCV: 94.5 fL (ref 78.0–100.0)
RDW: 13.9 % (ref 11.5–15.5)

## 2011-03-23 LAB — PROTIME-INR
INR: 0.91 (ref 0.00–1.49)
Prothrombin Time: 12.4 seconds (ref 11.6–15.2)

## 2011-03-23 LAB — DIFFERENTIAL
Basophils Relative: 1 % (ref 0–1)
Eosinophils Absolute: 0.2 10*3/uL (ref 0.0–0.7)
Eosinophils Relative: 3 % (ref 0–5)
Lymphocytes Relative: 30 % (ref 12–46)
Monocytes Relative: 9 % (ref 3–12)
Neutro Abs: 3.4 10*3/uL (ref 1.7–7.7)
Neutrophils Relative %: 58 % (ref 43–77)

## 2011-03-23 NOTE — Telephone Encounter (Signed)
[phone] EKG, ECHO, STRESS, OV

## 2011-03-23 NOTE — Telephone Encounter (Signed)
JULIA WITH [facility] ASKING FOR LAST OV NOTE,EKG AND ANY CARDIAC TESTING AND OR PROCEDURE TO BE FAXED TO: [phone].

## 2011-03-24 LAB — URINE CULTURE: Culture  Setup Time: 201207201344

## 2011-03-30 ENCOUNTER — Other Ambulatory Visit (HOSPITAL_COMMUNITY): Payer: Self-pay | Admitting: Orthopedic Surgery

## 2011-03-30 ENCOUNTER — Inpatient Hospital Stay (HOSPITAL_COMMUNITY): Payer: Medicare Other

## 2011-03-30 ENCOUNTER — Ambulatory Visit (HOSPITAL_COMMUNITY)
Admission: RE | Admit: 2011-03-30 | Discharge: 2011-03-30 | Disposition: A | Discharge status: Home / Self Care | Payer: Medicare Other | Source: Ambulatory Visit | Attending: Orthopedic Surgery | Admitting: Orthopedic Surgery

## 2011-03-30 ENCOUNTER — Inpatient Hospital Stay (HOSPITAL_COMMUNITY)
Admission: RE | Admit: 2011-03-30 | Discharge: 2011-04-02 | DRG: 470 | Disposition: A | Discharge status: Home Health | Payer: Medicare Other | Source: Ambulatory Visit | Attending: Orthopedic Surgery | Admitting: Orthopedic Surgery

## 2011-03-30 DIAGNOSIS — Z01811 Encounter for preprocedural respiratory examination: Secondary | ICD-10-CM

## 2011-03-30 DIAGNOSIS — Z01818 Encounter for other preprocedural examination: Secondary | ICD-10-CM

## 2011-03-30 DIAGNOSIS — Z880 Allergy status to penicillin: Secondary | ICD-10-CM

## 2011-03-30 DIAGNOSIS — Z882 Allergy status to sulfonamides status: Secondary | ICD-10-CM

## 2011-03-30 DIAGNOSIS — D62 Acute posthemorrhagic anemia: Secondary | ICD-10-CM | POA: Diagnosis not present

## 2011-03-30 DIAGNOSIS — I10 Essential (primary) hypertension: Secondary | ICD-10-CM | POA: Diagnosis present

## 2011-03-30 DIAGNOSIS — M1710 Unilateral primary osteoarthritis, unspecified knee: Principal | ICD-10-CM | POA: Diagnosis present

## 2011-03-30 DIAGNOSIS — G8929 Other chronic pain: Secondary | ICD-10-CM | POA: Diagnosis present

## 2011-03-31 LAB — BASIC METABOLIC PANEL
BUN: 8 mg/dL (ref 6–23)
CO2: 32 mEq/L (ref 19–32)
Calcium: 7.8 mg/dL — ABNORMAL LOW (ref 8.4–10.5)
Chloride: 97 mEq/L (ref 96–112)
Creatinine, Ser: 0.61 mg/dL (ref 0.50–1.10)
GFR calc Af Amer: >60 mL/min (ref >60)

## 2011-03-31 LAB — CBC
HCT: 25 % — ABNORMAL LOW (ref 36.0–46.0)
MCH: 32.2 pg (ref 26.0–34.0)
MCV: 94.7 fL (ref 78.0–100.0)
RDW: 14 % (ref 11.5–15.5)
WBC: 6.6 10*3/uL (ref 4.0–10.5)

## 2011-04-01 LAB — CBC
HCT: 23.5 % — ABNORMAL LOW (ref 36.0–46.0)
MCHC: 34.5 g/dL (ref 30.0–36.0)
MCV: 94.8 fL (ref 78.0–100.0)
RDW: 14.3 % (ref 11.5–15.5)

## 2011-04-01 LAB — BASIC METABOLIC PANEL
BUN: 8 mg/dL (ref 6–23)
Creatinine, Ser: 0.65 mg/dL (ref 0.50–1.10)
GFR calc Af Amer: >60 mL/min (ref >60)
GFR calc non Af Amer: >60 mL/min (ref >60)

## 2011-04-02 LAB — CBC
MCH: 32 pg (ref 26.0–34.0)
MCHC: 34.6 g/dL (ref 30.0–36.0)
Platelets: 202 10*3/uL (ref 150–400)
RDW: 15.4 % (ref 11.5–15.5)

## 2011-04-02 LAB — TYPE AND SCREEN
ABO/RH(D): O POS
Antibody Screen: NEGATIVE

## 2011-04-02 LAB — COMPREHENSIVE METABOLIC PANEL
ALT: 14 U/L (ref 0–35)
AST: 16 U/L (ref 0–37)
Calcium: 8.4 mg/dL (ref 8.4–10.5)
GFR calc Af Amer: >60 mL/min (ref >60)
Sodium: 135 mEq/L (ref 135–145)
Total Protein: 5.6 g/dL — ABNORMAL LOW (ref 6.0–8.3)

## 2011-04-08 NOTE — Op Note (Signed)
NAMESHARIKA, [name] NO.:  [id number]  MEDICAL RECORD NO.:  [id number]  LOCATION:  XRAY                         FACILITY:  [facility]  PHYSICIAN:  Dyke Brackett, M.D.    DATE OF BIRTH:  1924/09/19  DATE OF PROCEDURE:  03/30/2011 DATE OF DISCHARGE:                              OPERATIVE REPORT   INDICATIONS:  This is an 77 year old with intractable right knee pain, thought to be amenable to hospitalization and total knee replacement.  PREOPERATIVE DIAGNOSIS:  Osteoarthritis, valgus inclination of right knee.  POSTOPERATIVE DIAGNOSIS:  Osteoarthritis, valgus inclination of right knee.  OPERATION:  Right total knee (Sigma cemented knee size 2.5 tibia, size 3 femur with 10-mm bearing, and 35-mm three peg all-poly patella).  SURGEON:  Dyke Brackett, MD.  ASSISTANTErskine Squibb B. Su Hilt, PA.  TOURNIQUET TIME:  1 hour.  PROCEDURE IN DETAIL:  Sterile prep and drape, exsanguination of legs, inflation 350.  Straight skin incision, medial parapatellar approach to the knee made.  We cut the distal femur with 5 degrees distal cut with a 10-mm resection followed by 10-mm cut of the least diseased medial compartment with appropriate valgus using external guide, then checked the extension gap at 10 mm.  We then sized the femur to be a 3, then placed two pins setting the rotation of the femur with a block in the flange through a rotational guide with pinning. Then, we placed a 5 and 1 cutting block on that and cut the anterior-posterior chamfers all from this.  We then matched the extension gap and the flexion gap at 10 mm. Posterior clean out was carried out with excess meniscus.  I removed as well as released the PCL taking small amount of osteophytes off the posterior aspect of the knee.  Attention was next directed to the tibia.  We cut the keel hole for the tibia sized it to be 2.5, placed a trial tibial bearing, trial femur, and then cut the patella leaving a 14-15 mm  of native patella.  We then placed the trial patella, placed the lug holes for the femur, and then placed all trials.  Zero range of motion, good stability.  No varus- valgus instability.  Anterior drawer minimal to 1+ that was noted.  The trial components were removed.  The bony surfaces were irrigated.  We then inserted the final components with cement coating the prosthesis tibia followed by femur patella.  We did not use the final bearing insert, however, trial bearing was placed back into the final components.  Cement was allowed to harden.  Excess cement was removed, removed the trial bearing, checked for excess cement.  None was noted and then we released the tourniquet.  Small bleeders were coagulated. No excess bleeding noted in the posterior aspect of the knee.  Then placed the final bearing and checked all.  Range of motion, stability, parameters to be acceptable.  Closure was effected with #1 Ethibond, 2-0 Vicryl skin clips, Marcaine 10 mL infiltrated into the capsule.  Also noted as addendum she had a preoperative femoral nerve block in addition to the general anesthetic.  Light compressive sterile dressing applied. Taken to recovery room in  stable condition.     Dyke Brackett, M.D.     WDC/MEDQ  D:  03/30/2011  T:  03/30/2011  Job:  [id number]  Electronically Signed by W. Koriana Stepien M.D. on 04/08/2011 02:28:33 PM

## 2011-04-24 NOTE — Discharge Summary (Signed)
NAMESAFIA, Green NO.:  [id number]  MEDICAL RECORD NO.:  [id number]  LOCATION:  5024                         FACILITY:  [facility]  PHYSICIAN:  [name], M.D.    DATE OF BIRTH:  1925-04-19  DATE OF ADMISSION:  03/30/2011 DATE OF DISCHARGE:  04/02/2011                              DISCHARGE SUMMARY   DIAGNOSIS:  End-stage arthritis of the right knee.  DISCHARGE SUMMARY:  HISTORY OF PRESENT ILLNESS:  The patient is an 77 year old woman with a many-year history of bilateral knee osteoarthritis.  She underwent a left total knee arthroplasty in November of 2010 and she now is complaining of increased pain in her right knee, it prevents sleep and activities of daily living as well as safe ambulation.  She wishes to undergo right total knee arthroplasty after discussing the risks versus benefits having failed conservative care.  Primary MD is Dr. Cleta Green, Green is Dr. Deborah Green, both of which gave medical clearance for the surgery to continue with relative risks.  The patient's medical history shows that she has had cardiac catheterization in 2008 which showed normal function, history of hypertension, chronic lower extremity edema, thyroidectomy in 1963 and 1966, and abdominal hysterectomy as well as other orthopedic procedures.  Other surgical history includes back surgery in 2000.  She tolerated all these procedures well without any difficulty with anesthesia.  SOCIAL HISTORY:  The patient does not use tobacco or alcohol.  FAMILY HISTORY:  Unremarkable to her current admission.  REVIEW OF SYSTEMS:  The patient denies any recent illness or complaint. Further review 14 systems is remarkable for glasses.  PHYSICAL EXAMINATION:  VITAL SIGNS:  On preoperative physical, temperature 97.5, pulse 52, respirations 18, blood pressure 120/65. HEENT:  Head is normocephalic, atraumatic.  Pupils equal, round, and reactive to light and accommodation.  Nose and throat  are clear.  The patient is 5 feet 345 pound woman. NECK:  Supple, full range of motion. CHEST:  Lungs are clear to auscultation. CARDIAC:  Regular rate and rhythm. ABDOMEN:  Soft, nontender. Neurovascularly intact with bilateral lower extremity tingling and pain after her back surgery. SKIN:  Shows no break or other current abnormality. MUSCULOSKELETAL:  Right knee range of motion 5-110 degrees, stable ligament, positive crepitus.  PREOPERATIVE LABS:  Including CBC, CMET, chest x-ray, EKG, PT and PTT were all within acceptable limits.  HOSPITAL COURSE:  On the day of admission, the patient was taken to the operating room where she underwent a right total knee arthroplasty utilizing DePuy Sigma components size 3 right femur, size 2.5 fit tibia, 10 mm bearing and 35 mm patella, all components cemented.  The patient was placed on perioperative antibiotics.  She was placed on postoperative Lovenox prophylaxis.  Physical therapy was begun in the PACU using CPM as well as physical therapy on the floor begriming the evening of the surgery.  Hemovac and Foley drains were placed.  On postoperative day #1, the patient was afebrile, but had a spike of temperature the previous evening of 101.5.  Hemovac output was only 50 over the last 24 hours was discontinued without difficulty.  The patient was neurovascularly intact and otherwise stable.  PCA was  discontinued. Foley was discontinued.  Physical therapy was continued in earnest. Postoperative day #2, the patient had some complain of being lightheaded and dizzy when up with ambulation.  T max 100.8, temperature current 99.9, pulse of 98.  Wound was clean and dry.  Hemoglobin 8.1.  Because of her symptomatic acute blood loss anemia, she was given 2 units packed red blood cells and physical therapy was continued afterwards. Postoperative day #3, the patient's pain was 2/10.  She is tolerating her diet well.  Hemoglobin had increased to 9.9 after  transfusion, WBC of 7.7.  She is alert and oriented x3, had no continued symptoms of dizziness, tolerating CPM 0-90, was otherwise medically stable, and was discharged home to the care of her family.  She will require home health physical therapy with home CPM.  She will continue to use a walker for weightbearing as tolerated, ambulation and 3 in 1 commode seat to both use as well as increased safety.  She should follow up with [name] in 10 days' time sooner if should she have any increase in temperature greater than 101, any drainage from the wound they look like pus or pain is not well controlled by oral pain medication.  At the time of her discharge, her medications were 1. Tylenol 325 mg 1-2 by mouth every 4 hours as needed for pain or     fever. 2. Colace 100 mg by mouth twice daily. 3. Lovenox 30 mg subcutaneously b.i.d. for the next 7 days. 4. Methocarbamol 500 mg by mouth every 6 hours as needed for spasm. 5. Percocet 5/325 one to two by mouth every 4 hours as needed for     pain. 6. Citracal plus D 1 tablet by mouth daily. 7. Dorzolamide ophthalmic 1 drop both eyes twice daily. 8. Gabapentin 300 mg 1 tablet by mouth daily. 9. Latanoprost 0.005% 1 drop both eyes daily at bedtime. 10.Maxzide 37.5/25 one half tablet by mouth daily. 11.Ocuvite 1 tablet by mouth daily. 12.Propranolol 20 mg by mouth daily. 13.Synthroid 100 mcg 1 tablet by mouth daily. 14.Valium 5 mg one half tablet by mouth daily at bedtime as needed.  WOUND CARE:  Dressing changes needed, to keep the wound clean, dry and covered.  ACTIVITIES:  Weightbearing as tolerated using a walker.  DIAGNOSIS:  End-stage osteoarthritis of the right knee.  PROCEDURE IN HOSPITAL:  Right total knee arthroplasty.     [name]. [name]   ______________________________ [name], M.D.    JBR/MEDQ  D:  04/11/2011  T:  04/11/2011  Job:  [id number]  Electronically Signed by [name] P.A. on 04/19/2011  09:16:45 AM Electronically Signed by [name]. [name] M.D. on 04/24/2011 12:38:48 PM

## 2011-05-15 ENCOUNTER — Other Ambulatory Visit: Payer: Self-pay | Admitting: Orthopedic Surgery

## 2011-05-15 DIAGNOSIS — M7989 Other specified soft tissue disorders: Secondary | ICD-10-CM

## 2011-05-16 ENCOUNTER — Ambulatory Visit
Admission: RE | Admit: 2011-05-16 | Discharge: 2011-05-16 | Disposition: A | Discharge status: Home / Self Care | Payer: Medicare Other | Source: Ambulatory Visit | Attending: Orthopedic Surgery | Admitting: Orthopedic Surgery

## 2011-05-16 DIAGNOSIS — M7989 Other specified soft tissue disorders: Secondary | ICD-10-CM

## 2011-05-25 LAB — BASIC METABOLIC PANEL
BUN: 16
Chloride: 101
Glucose, Bld: 86
Potassium: 5.6 — ABNORMAL HIGH

## 2011-05-25 LAB — HEMOGLOBIN AND HEMATOCRIT, BLOOD: Hemoglobin: 13.9

## 2011-06-15 LAB — COMPREHENSIVE METABOLIC PANEL
ALT: 25
AST: 27
Alkaline Phosphatase: 63
CO2: 32
Chloride: 96
Creatinine, Ser: 0.93
GFR calc Af Amer: >60
GFR calc non Af Amer: 58 — ABNORMAL LOW
Potassium: 4.2
Total Bilirubin: 0.6

## 2011-06-15 LAB — DIFFERENTIAL
Basophils Absolute: 0
Basophils Relative: 1
Eosinophils Absolute: 0.1
Eosinophils Relative: 2

## 2011-06-15 LAB — URINALYSIS, ROUTINE W REFLEX MICROSCOPIC
Glucose, UA: NEGATIVE
Hgb urine dipstick: NEGATIVE
Ketones, ur: NEGATIVE
Protein, ur: NEGATIVE

## 2011-06-15 LAB — CBC
MCV: 95.1
RBC: 4.49
WBC: 5.5

## 2011-06-15 LAB — TYPE AND SCREEN: ABO/RH(D): O POS

## 2011-06-15 LAB — ABO/RH: ABO/RH(D): O POS

## 2011-06-15 LAB — URINE MICROSCOPIC-ADD ON

## 2011-06-15 LAB — PROTIME-INR: Prothrombin Time: 12.2

## 2011-06-20 LAB — CBC
HCT: 37
HCT: 39.7
Hemoglobin: 12.4
MCHC: 33.7
MCV: 91.6
MCV: 91.7
Platelets: 237
Platelets: 258
RDW: 13.5
RDW: 13.5

## 2011-06-20 LAB — COMPREHENSIVE METABOLIC PANEL
Albumin: 3.7
BUN: 9
Calcium: 9
Creatinine, Ser: 0.86
Glucose, Bld: 109 — ABNORMAL HIGH
Total Protein: 6.1

## 2011-06-20 LAB — PROTIME-INR
INR: 0.9
Prothrombin Time: 12.3

## 2011-06-20 LAB — CARDIAC PANEL(CRET KIN+CKTOT+MB+TROPI)
CK, MB: 4
Relative Index: 3.9 — ABNORMAL HIGH
Total CK: 111
Troponin I: 0.01
Troponin I: 0.02

## 2011-06-20 LAB — APTT: aPTT: 31

## 2011-06-20 LAB — B-NATRIURETIC PEPTIDE (CONVERTED LAB): Pro B Natriuretic peptide (BNP): 45

## 2011-08-21 ENCOUNTER — Other Ambulatory Visit: Payer: Self-pay | Admitting: Orthopedic Surgery

## 2011-09-04 HISTORY — PX: OTHER SURGICAL HISTORY: SHX169

## 2011-09-06 ENCOUNTER — Ambulatory Visit
Admission: RE | Admit: 2011-09-06 | Discharge: 2011-09-06 | Disposition: A | Discharge status: Home / Self Care | Payer: Medicare Other | Source: Ambulatory Visit | Attending: Orthopedic Surgery | Admitting: Orthopedic Surgery

## 2011-09-06 ENCOUNTER — Other Ambulatory Visit: Payer: Self-pay | Admitting: Orthopedic Surgery

## 2011-09-06 DIAGNOSIS — M1710 Unilateral primary osteoarthritis, unspecified knee: Secondary | ICD-10-CM | POA: Diagnosis not present

## 2011-09-06 DIAGNOSIS — M7989 Other specified soft tissue disorders: Secondary | ICD-10-CM

## 2011-09-06 DIAGNOSIS — M79609 Pain in unspecified limb: Secondary | ICD-10-CM | POA: Diagnosis not present

## 2011-09-06 DIAGNOSIS — R609 Edema, unspecified: Secondary | ICD-10-CM | POA: Diagnosis not present

## 2011-09-11 ENCOUNTER — Encounter (HOSPITAL_BASED_OUTPATIENT_CLINIC_OR_DEPARTMENT_OTHER): Payer: Self-pay | Admitting: *Deleted

## 2011-09-11 DIAGNOSIS — IMO0002 Reserved for concepts with insufficient information to code with codable children: Secondary | ICD-10-CM | POA: Diagnosis not present

## 2011-09-11 DIAGNOSIS — M961 Postlaminectomy syndrome, not elsewhere classified: Secondary | ICD-10-CM | POA: Diagnosis not present

## 2011-09-11 DIAGNOSIS — M129 Arthropathy, unspecified: Secondary | ICD-10-CM | POA: Diagnosis not present

## 2011-09-11 DIAGNOSIS — M5137 Other intervertebral disc degeneration, lumbosacral region: Secondary | ICD-10-CM | POA: Diagnosis not present

## 2011-09-12 ENCOUNTER — Encounter (HOSPITAL_BASED_OUTPATIENT_CLINIC_OR_DEPARTMENT_OTHER)
Admission: RE | Admit: 2011-09-12 | Discharge: 2011-09-12 | Disposition: A | Discharge status: Home / Self Care | Payer: Medicare Other | Source: Ambulatory Visit | Attending: Orthopedic Surgery | Admitting: Orthopedic Surgery

## 2011-09-12 DIAGNOSIS — Z85850 Personal history of malignant neoplasm of thyroid: Secondary | ICD-10-CM | POA: Diagnosis not present

## 2011-09-12 DIAGNOSIS — G5600 Carpal tunnel syndrome, unspecified upper limb: Secondary | ICD-10-CM | POA: Diagnosis not present

## 2011-09-12 DIAGNOSIS — Z96659 Presence of unspecified artificial knee joint: Secondary | ICD-10-CM | POA: Diagnosis not present

## 2011-09-12 DIAGNOSIS — M19049 Primary osteoarthritis, unspecified hand: Secondary | ICD-10-CM | POA: Diagnosis not present

## 2011-09-12 LAB — BASIC METABOLIC PANEL
BUN: 10 mg/dL (ref 6–23)
Creatinine, Ser: 0.89 mg/dL (ref 0.50–1.10)
GFR calc non Af Amer: 58 mL/min — ABNORMAL LOW (ref >90)
Glucose, Bld: 87 mg/dL (ref 70–99)
Potassium: 3.2 mEq/L — ABNORMAL LOW (ref 3.5–5.1)

## 2011-09-13 ENCOUNTER — Ambulatory Visit (HOSPITAL_BASED_OUTPATIENT_CLINIC_OR_DEPARTMENT_OTHER)
Admission: RE | Admit: 2011-09-13 | Discharge: 2011-09-13 | Disposition: A | Discharge status: Home / Self Care | Payer: Medicare Other | Source: Ambulatory Visit | Attending: Orthopedic Surgery | Admitting: Orthopedic Surgery

## 2011-09-13 ENCOUNTER — Encounter (HOSPITAL_BASED_OUTPATIENT_CLINIC_OR_DEPARTMENT_OTHER): Payer: Self-pay | Admitting: Orthopedic Surgery

## 2011-09-13 ENCOUNTER — Ambulatory Visit (HOSPITAL_BASED_OUTPATIENT_CLINIC_OR_DEPARTMENT_OTHER): Payer: Medicare Other | Admitting: Anesthesiology

## 2011-09-13 ENCOUNTER — Encounter (HOSPITAL_BASED_OUTPATIENT_CLINIC_OR_DEPARTMENT_OTHER): Payer: Self-pay | Admitting: Anesthesiology

## 2011-09-13 ENCOUNTER — Encounter (HOSPITAL_BASED_OUTPATIENT_CLINIC_OR_DEPARTMENT_OTHER): Payer: Self-pay | Admitting: *Deleted

## 2011-09-13 ENCOUNTER — Encounter (HOSPITAL_BASED_OUTPATIENT_CLINIC_OR_DEPARTMENT_OTHER)
Admission: RE | Disposition: A | Discharge status: Home / Self Care | Payer: Self-pay | Source: Ambulatory Visit | Attending: Orthopedic Surgery

## 2011-09-13 DIAGNOSIS — Z96659 Presence of unspecified artificial knee joint: Secondary | ICD-10-CM | POA: Diagnosis not present

## 2011-09-13 DIAGNOSIS — M19049 Primary osteoarthritis, unspecified hand: Secondary | ICD-10-CM | POA: Insufficient documentation

## 2011-09-13 DIAGNOSIS — G5600 Carpal tunnel syndrome, unspecified upper limb: Secondary | ICD-10-CM | POA: Diagnosis not present

## 2011-09-13 DIAGNOSIS — Z85850 Personal history of malignant neoplasm of thyroid: Secondary | ICD-10-CM | POA: Insufficient documentation

## 2011-09-13 HISTORY — DX: Unspecified macular degeneration: H35.30

## 2011-09-13 HISTORY — DX: Other injury of unspecified body region, initial encounter: T14.8XXA

## 2011-09-13 HISTORY — DX: Angina pectoris, unspecified: I20.9

## 2011-09-13 HISTORY — DX: Hypoglycemia, unspecified: E16.2

## 2011-09-13 HISTORY — DX: Headache: R51

## 2011-09-13 SURGERY — CARPAL TUNNEL RELEASE
Anesthesia: General | Site: Wrist | Laterality: Left | Wound class: Clean

## 2011-09-13 MED ORDER — DEXAMETHASONE SODIUM PHOSPHATE 4 MG/ML IJ SOLN
INTRAMUSCULAR | Status: DC | PRN
  Administered 2011-09-13: 10 mg via INTRAVENOUS

## 2011-09-13 MED ORDER — PROPOFOL 10 MG/ML IV EMUL
INTRAVENOUS | Status: DC | PRN
  Administered 2011-09-13: 30 mg via INTRAVENOUS
  Administered 2011-09-13: 150 mg via INTRAVENOUS

## 2011-09-13 MED ORDER — LIDOCAINE HCL 2 % IJ SOLN
INTRAMUSCULAR | Status: DC | PRN
  Administered 2011-09-13: 4 mL

## 2011-09-13 MED ORDER — LACTATED RINGERS IV SOLN
INTRAVENOUS | Status: DC
  Administered 2011-09-13 (×3): via INTRAVENOUS

## 2011-09-13 MED ORDER — METOCLOPRAMIDE HCL 5 MG/ML IJ SOLN: 10.0000 mg | Freq: Once | INTRAMUSCULAR | Status: DC | PRN

## 2011-09-13 MED ORDER — MIDAZOLAM HCL 2 MG/2ML IJ SOLN: 0.5000 mg | INTRAMUSCULAR | Status: DC | PRN

## 2011-09-13 MED ORDER — CHLORHEXIDINE GLUCONATE 4 % EX LIQD
60.0000 mL | Freq: Once | CUTANEOUS | Status: AC
  Administered 2011-09-13: 4 via TOPICAL

## 2011-09-13 MED ORDER — HYDROCODONE-ACETAMINOPHEN 5-325 MG PO TABS
1.0000 | ORAL_TABLET | Freq: Once | ORAL | Status: AC | PRN
  Administered 2011-09-13: 1 via ORAL

## 2011-09-13 MED ORDER — MORPHINE SULFATE 2 MG/ML IJ SOLN: 0.0500 mg/kg | INTRAMUSCULAR | Status: DC | PRN

## 2011-09-13 MED ORDER — FENTANYL CITRATE 0.05 MG/ML IJ SOLN: 50.0000 ug | INTRAMUSCULAR | Status: DC | PRN

## 2011-09-13 MED ORDER — HYDROCODONE-ACETAMINOPHEN 5-325 MG PO TABS: ORAL_TABLET | ORAL | Status: AC

## 2011-09-13 MED ORDER — FENTANYL CITRATE 0.05 MG/ML IJ SOLN
25.0000 ug | INTRAMUSCULAR | Status: DC | PRN
  Administered 2011-09-13: 50 ug via INTRAVENOUS
  Administered 2011-09-13 (×2): 25 ug via INTRAVENOUS

## 2011-09-13 SURGICAL SUPPLY — 36 items
BANDAGE ADHESIVE 1X3 (GAUZE/BANDAGES/DRESSINGS) IMPLANT
BANDAGE ELASTIC 3 VELCRO ST LF (GAUZE/BANDAGES/DRESSINGS) ×2 IMPLANT
BLADE SURG 15 STRL LF DISP TIS (BLADE) ×1 IMPLANT
BLADE SURG 15 STRL SS (BLADE) ×1
BNDG ESMARK 4X9 LF (GAUZE/BANDAGES/DRESSINGS) ×2 IMPLANT
BRUSH SCRUB EZ PLAIN DRY (MISCELLANEOUS) ×2 IMPLANT
CLOTH BEACON ORANGE TIMEOUT ST (SAFETY) ×2 IMPLANT
CORDS BIPOLAR (ELECTRODE) ×2 IMPLANT
COVER MAYO STAND STRL (DRAPES) ×2 IMPLANT
COVER TABLE BACK 60X90 (DRAPES) ×2 IMPLANT
CUFF TOURNIQUET SINGLE 18IN (TOURNIQUET CUFF) ×2 IMPLANT
DECANTER SPIKE VIAL GLASS SM (MISCELLANEOUS) IMPLANT
DRAPE EXTREMITY T 121X128X90 (DRAPE) ×2 IMPLANT
DRAPE SURG 17X23 STRL (DRAPES) ×2 IMPLANT
GLOVE BIO SURGEON STRL SZ 6.5 (GLOVE) ×2 IMPLANT
GLOVE BIOGEL M STRL SZ7.5 (GLOVE) ×2 IMPLANT
GLOVE ORTHO TXT STRL SZ7.5 (GLOVE) ×2 IMPLANT
GOWN PREVENTION PLUS XLARGE (GOWN DISPOSABLE) ×2 IMPLANT
GOWN PREVENTION PLUS XXLARGE (GOWN DISPOSABLE) ×4 IMPLANT
NEEDLE 27GAX1X1/2 (NEEDLE) ×2 IMPLANT
PACK BASIN DAY SURGERY FS (CUSTOM PROCEDURE TRAY) ×2 IMPLANT
PAD CAST 3X4 CTTN HI CHSV (CAST SUPPLIES) ×1 IMPLANT
PADDING CAST ABS 4INX4YD NS (CAST SUPPLIES) ×1
PADDING CAST ABS COTTON 4X4 ST (CAST SUPPLIES) ×1 IMPLANT
PADDING CAST COTTON 3X4 STRL (CAST SUPPLIES) ×1
SPLINT PLASTER CAST XFAST 3X15 (CAST SUPPLIES) ×5 IMPLANT
SPLINT PLASTER XTRA FASTSET 3X (CAST SUPPLIES) ×5
SPONGE GAUZE 4X4 12PLY (GAUZE/BANDAGES/DRESSINGS) ×2 IMPLANT
STOCKINETTE 4X48 STRL (DRAPES) ×2 IMPLANT
STRIP CLOSURE SKIN 1/2X4 (GAUZE/BANDAGES/DRESSINGS) ×2 IMPLANT
SUT PROLENE 3 0 PS 2 (SUTURE) ×2 IMPLANT
SYR 3ML 23GX1 SAFETY (SYRINGE) IMPLANT
SYR CONTROL 10ML LL (SYRINGE) ×2 IMPLANT
TRAY DSU PREP LF (CUSTOM PROCEDURE TRAY) ×2 IMPLANT
UNDERPAD 30X30 INCONTINENT (UNDERPADS AND DIAPERS) ×2 IMPLANT
WATER STERILE IRR 1000ML POUR (IV SOLUTION) ×2 IMPLANT

## 2011-09-13 NOTE — Transfer of Care (Signed)
Immediate Anesthesia Transfer of Care Note  Patient: [name]  Procedure(s) Performed:  CARPAL TUNNEL RELEASE  Patient Location: PACU  Anesthesia Type: General  Level of Consciousness: sedated  Airway & Oxygen Therapy: Patient Spontanous Breathing and Patient connected to face mask oxygen  Post-op Assessment: Report given to PACU RN and Post -op Vital signs reviewed and stable  Post vital signs: Reviewed and stable Filed Vitals:   09/13/11 0937  BP: 153/70  Pulse: 62  Temp: 36.7 C  Resp: 20    Complications: No apparent anesthesia complications

## 2011-09-13 NOTE — Brief Op Note (Signed)
09/13/2011  12:15 PM  PATIENT:  [name]  78 y.o. female  PRE-OPERATIVE DIAGNOSIS:  left carpal tunnel syndrome  POST-OPERATIVE DIAGNOSIS:  left carpal tunnel syndrome  PROCEDURE:  Procedure(s): CARPAL TUNNEL RELEASE LEFT WRIST  SURGEON:  Surgeon(s): Wyn Forster., MD  PHYSICIAN ASSISTANT:   ASSISTANTS: Mallory Shirk.A-C    ANESTHESIA:   general  EBL:  Total I/O In: 200 [I.V.:200] Out: -   BLOOD ADMINISTERED:none  DRAINS: none   LOCAL MEDICATIONS USED:  XYLOCAINE 3 CC  SPECIMEN:  No Specimen  DISPOSITION OF SPECIMEN:  N/A  COUNTS:  YES  TOURNIQUET:   Total Tourniquet Time Documented: Upper Arm (Left) - 9 minutes  DICTATION: .Other Dictation: Dictation Number [id number]  PLAN OF CARE: Discharge to home after PACU  PATIENT DISPOSITION:  PACU - hemodynamically stable.   Delay start of Pharmacological VTE agent (>24hrs) due to surgical blood loss or risk of bleeding:  NOT APPLICABLE

## 2011-09-13 NOTE — Anesthesia Procedure Notes (Signed)
Procedure Name: LMA Insertion Date/Time: 09/13/2011 11:48 AM Performed by: Jearld Shines Pre-anesthesia Checklist: Patient identified, Emergency Drugs available, Suction available, Patient being monitored and Timeout performed Patient Re-evaluated:Patient Re-evaluated prior to inductionOxygen Delivery Method: Circle System Utilized Preoxygenation: Pre-oxygenation with 100% oxygen Intubation Type: IV induction Ventilation: Mask ventilation without difficulty LMA: LMA inserted LMA Size: 4.0 Number of attempts: 1 Airway Equipment and Method: bite block Placement Confirmation: positive ETCO2 Tube secured with: Tape Dental Injury: Teeth and Oropharynx as per pre-operative assessment

## 2011-09-13 NOTE — Anesthesia Postprocedure Evaluation (Signed)
Anesthesia Post Note  Patient: [name]  Procedure(s) Performed:  CARPAL TUNNEL RELEASE  Anesthesia type: General  Patient location: PACU  Post pain: Pain level controlled  Post assessment: Patient's Cardiovascular Status Stable  Last Vitals:  Filed Vitals:   09/13/11 1345  BP: 145/62  Pulse: 73  Temp:   Resp: 21    Post vital signs: Reviewed and stable  Level of consciousness: alert  Complications: No apparent anesthesia complications

## 2011-09-13 NOTE — Op Note (Signed)
Op note dictated 09/13/11 [id number]

## 2011-09-13 NOTE — Anesthesia Preprocedure Evaluation (Signed)
Anesthesia Evaluation  Patient identified by MRN, date of birth, ID band Patient awake    Reviewed: Allergy & Precautions, H&P , NPO status , Patient's Chart, lab work & pertinent test results, reviewed documented beta blocker date and time   Airway Mallampati: II TM Distance: >3 FB Neck ROM: full    Dental   Pulmonary neg pulmonary ROS,          Cardiovascular hypertension, On Medications and On Home Beta Blockers     Neuro/Psych  Headaches, Negative Psych ROS   GI/Hepatic negative GI ROS, Neg liver ROS,   Endo/Other  Negative Endocrine ROS  Renal/GU negative Renal ROS  Genitourinary negative   Musculoskeletal   Abdominal   Peds  Hematology negative hematology ROS (+)   Anesthesia Other Findings See surgeon's H&P   Reproductive/Obstetrics negative OB ROS                           Anesthesia Physical Anesthesia Plan  ASA: II  Anesthesia Plan: General   Post-op Pain Management:    Induction: Intravenous  Airway Management Planned: LMA  Additional Equipment:   Intra-op Plan:   Post-operative Plan: Extubation in OR  Informed Consent: I have reviewed the patients History and Physical, chart, labs and discussed the procedure including the risks, benefits and alternatives for the proposed anesthesia with the patient or authorized representative who has indicated his/her understanding and acceptance.     Plan Discussed with: CRNA and Surgeon  Anesthesia Plan Comments:         Anesthesia Quick Evaluation  

## 2011-09-13 NOTE — H&P (Signed)
[name] is an 78 y.o. female.   Chief Complaint: c/o persistent numbness and tingling l;eft hand  HPI:  [name] is a well known patient who is status post prior right carpal tunnel release. She has had chronic left carpal tunnel syndrome with electrodiagnostic studies documenting significant median neuropathy performed 07/12/10.  She has splinted and tried steroid injection without relief.  She would like to proceed with release of her left transverse carpal ligament at this time.   Past Medical History  Diagnosis Date  . Angina   . Headache   . Hypoglycemia   . Cancer 1986    thyroid  . Fracture 2008    back  . Macular degeneration of both eyes   . Glaucoma     Past Surgical History  Procedure Date  . Joint replacement 2005, 2006, 2012    bil knees  . Back surgery   . Fracture surgery   . Thyroidectomy   . Shoulder arthroscopy w/ rotator cuff repair   . Carpal tunnel release   . Trigger finger release   . Abdominal hysterectomy     History reviewed. No pertinent family history. Social History:  reports that she has never smoked. She has never used smokeless tobacco. She reports that she does not drink alcohol or use illicit drugs.  Allergies:  Allergies  Allergen Reactions  . Demerol Shortness Of Breath  . Penicillins Swelling  . Ativan Anxiety  . Tetracyclines & Related Rash    No current facility-administered medications on file as of .   Medications Prior to Admission  Medication Sig Dispense Refill  . aspirin 81 MG tablet Take 160 mg by mouth daily.        . dorzolamide-timolol (COSOPT) 22.3-6.8 MG/ML ophthalmic solution Place 1 drop into both eyes 2 (two) times daily.        Marland Kitchen gabapentin (NEURONTIN) 300 MG capsule Take 300 mg by mouth at bedtime.        Marland Kitchen latanoprost (XALATAN) 0.005 % ophthalmic solution Place 1 drop into both eyes at bedtime.        Marland Kitchen levothyroxine (SYNTHROID, LEVOTHROID) 100 MCG tablet Take 100 mcg by mouth daily.        .  Multiple Vitamin (MULTIVITAMIN) tablet Take 1 tablet by mouth daily.        Marland Kitchen oxyCODONE (ROXICODONE) 15 MG immediate release tablet Take 15 mg by mouth every 4 (four) hours as needed. For back pain        . propranolol (INDERAL) 20 MG tablet Take 20 mg by mouth daily. Takes for migraines.      . triamterene-hydrochlorothiazide (DYAZIDE) 37.5-25 MG per capsule Take 0.5 capsules by mouth every morning. For fluid         Results for orders placed during the hospital encounter of 09/13/11 (from the past 48 hour(s))  BASIC METABOLIC PANEL     Status: Abnormal   Collection Time   09/12/11 11:40 AM      Component Value Range Comment   Sodium 139  135 - 145 (mEq/L)    Potassium 3.2 (*) 3.5 - 5.1 (mEq/L)    Chloride 100  96 - 112 (mEq/L)    CO2 33 (*) 19 - 32 (mEq/L)    Glucose, Bld 87  70 - 99 (mg/dL)    BUN 10  6 - 23 (mg/dL)    Creatinine, Ser 1.61  0.50 - 1.10 (mg/dL)    Calcium 8.8  8.4 - 10.5 (mg/dL)  GFR calc non Af Amer 58 (*) >90 (mL/min)    GFR calc Af Amer 67 (*) >90 (mL/min)     No results found.   Pertinent items are noted in HPI.  There were no vitals taken for this visit.  General appearance: alert Head: Normocephalic, without obvious abnormality Neck: supple, symmetrical, trachea midline Resp: clear to auscultation bilaterally Cardio: regular rate and rhythm, S1, S2 normal, no murmur, click, rub or gallop GI: normal findings: bowel sounds normal Extremities:. Inspection of her hands and wrists reveals marked swelling over the dorsal radial aspect of her left wrist. She has impairment of wrist motion with palmar flexion 65 vs 80 on the right, dorsiflexion 45 vs nearly 70 on the right. She has pain with radial and ulnar deviation. She has palpable synovitis at the radiocarpal articulation. She has pain with stress of her scapholunate ligament. Her pulses and capillary refill are intact. She has signs of carpal tunnel syndrome including diminished sensibility in the median  distribution on the right and significant loss of sensibility in the median distribution on the left. Her pulses and capillary refill are intact. She has no sign of stenosing tenosynovitis of her fingers, thumb or wrist.  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 The procedure risks benefits and postoperative course were again discussed with the patient at length and she was in agreement with this plan.  DASNOIT,[name] 09/13/2011, 8:51 AM   H&P documentation: 09/13/2011  -History and Physical Reviewed  -Patient has been re-examined  -No change in the plan of care  Wyn Forster, MD

## 2011-09-14 ENCOUNTER — Encounter (HOSPITAL_BASED_OUTPATIENT_CLINIC_OR_DEPARTMENT_OTHER): Payer: Self-pay | Admitting: Orthopedic Surgery

## 2011-09-14 NOTE — Op Note (Signed)
NAMEMARGARETA, [name]               ACCOUNT NO.:  [id number]  MEDICAL RECORD NO.:  [id number]  LOCATION:                                 FACILITY:  PHYSICIAN:  Katy Fitch. Ayvion Kavanagh, M.D. DATE OF BIRTH:  19-Sep-1924  DATE OF PROCEDURE:  09/13/2011 DATE OF DISCHARGE:                              OPERATIVE REPORT   PREOPERATIVE DIAGNOSIS:  Chronic left carpal tunnel syndrome with underlying arthritis of carpus.  POSTOPERATIVE DIAGNOSIS:  Chronic left carpal tunnel syndrome with underlying arthritis carpus.  OPERATION:  Release of left transverse carpal ligament.  SURGEON:  Katy Fitch. Kirrah Mustin, MD  ASSISTANT:  Marveen Reeks Dasnoit, PA-C  ANESTHESIA:  General by LMA.  SUPERVISING ANESTHESIOLOGIST:  Janetta Hora. [place].  INDICATIONS:  [name] is an 78 year old woman referred by Dr. Drucie Opitz for evaluation and management of hand numbness.  Clinical examination revealed signs of very significant left carpal tunnel syndrome with underlying carpal arthrosis.  Electrodiagnostic studies confirmed very significant median neuropathy.  We advised to proceed with release of the transverse carpal ligament. At age 37, we will not address her carpal arthrosis.  Questions regarding the anticipated surgery were invited and answered in detail.  Preoperatively, she was noted to be allergic to Demerol, penicillin, Ativan, tetracycline-related products.  PROCEDURE:  [name] was brought to room 6 at the [facility] and placed in supine position on the operating table.  Following induction of general anesthesia by LMA technique under Dr. Thornton Dales strict supervision, the left arm was prepped with Betadine soap and solution, sterilely draped.  Following routine surgical time-out, the arm was exsanguinated with an Esmarch bandage and an arterial tourniquet at proximal brachium was inflated to 250 mmHg.  Procedure commenced with a short incision in the line of the ring finger in  the palm. Subcutaneous tissues were carefully divided revealing the palmar fascia. This was split longitudinally through the common sensory branch of the median nerve.  These were followed back to the median nerve proper which was gently isolated from the undersurface of the transverse carpal ligament with a Insurance risk surveyor.  The ulnar border of the transverse carpal ligament was released with scissors under direct vision.  The volar forearm fascia was released subcutaneously.  This widely opened carpal canal.  No mass or predicaments noted.  Bleeding points along the margin of the released ligament were electrocauterized with bipolar current, followed by repair of the skin with intradermal 3-0 Prolene suture.  Compressive dressing was applied with a volar plaster splint maintaining the wrist in 10 degrees of dorsiflexion.  For aftercare, she is provided with prescription of Percocet 5 mg 1 p.o. q.4-6 h p.r.n. pain, 20 tablets without refill.     Katy Fitch [name], M.D.     RVS/MEDQ  D:  09/13/2011  T:  09/14/2011  Job:  [id number]  cc:   Drucie Opitz

## 2011-09-20 DIAGNOSIS — M1710 Unilateral primary osteoarthritis, unspecified knee: Secondary | ICD-10-CM | POA: Diagnosis not present

## 2011-10-30 DIAGNOSIS — Z87311 Personal history of (healed) other pathological fracture: Secondary | ICD-10-CM | POA: Diagnosis not present

## 2011-10-30 DIAGNOSIS — C73 Malignant neoplasm of thyroid gland: Secondary | ICD-10-CM | POA: Diagnosis not present

## 2011-10-30 DIAGNOSIS — I10 Essential (primary) hypertension: Secondary | ICD-10-CM | POA: Diagnosis not present

## 2011-10-30 DIAGNOSIS — R609 Edema, unspecified: Secondary | ICD-10-CM | POA: Diagnosis not present

## 2011-10-30 DIAGNOSIS — E785 Hyperlipidemia, unspecified: Secondary | ICD-10-CM | POA: Diagnosis not present

## 2011-10-30 DIAGNOSIS — E890 Postprocedural hypothyroidism: Secondary | ICD-10-CM | POA: Diagnosis not present

## 2011-10-30 DIAGNOSIS — M810 Age-related osteoporosis without current pathological fracture: Secondary | ICD-10-CM | POA: Diagnosis not present

## 2011-10-30 DIAGNOSIS — R5383 Other fatigue: Secondary | ICD-10-CM | POA: Diagnosis not present

## 2011-10-30 DIAGNOSIS — R5381 Other malaise: Secondary | ICD-10-CM | POA: Diagnosis not present

## 2011-11-02 DIAGNOSIS — M869 Osteomyelitis, unspecified: Secondary | ICD-10-CM

## 2011-11-02 HISTORY — PX: PERIPHERALLY INSERTED CENTRAL CATHETER INSERTION: SHX2221

## 2011-11-02 HISTORY — DX: Osteomyelitis, unspecified: M86.9

## 2011-11-06 DIAGNOSIS — IMO0002 Reserved for concepts with insufficient information to code with codable children: Secondary | ICD-10-CM | POA: Diagnosis not present

## 2011-11-06 DIAGNOSIS — M5137 Other intervertebral disc degeneration, lumbosacral region: Secondary | ICD-10-CM | POA: Diagnosis not present

## 2011-11-06 DIAGNOSIS — M129 Arthropathy, unspecified: Secondary | ICD-10-CM | POA: Diagnosis not present

## 2011-11-06 DIAGNOSIS — M961 Postlaminectomy syndrome, not elsewhere classified: Secondary | ICD-10-CM | POA: Diagnosis not present

## 2011-11-08 ENCOUNTER — Other Ambulatory Visit: Payer: Self-pay | Admitting: Physician Assistant

## 2011-11-08 ENCOUNTER — Encounter (HOSPITAL_COMMUNITY): Payer: Self-pay | Admitting: General Practice

## 2011-11-08 ENCOUNTER — Encounter: Payer: Self-pay | Admitting: Physician Assistant

## 2011-11-08 ENCOUNTER — Inpatient Hospital Stay (HOSPITAL_COMMUNITY)
Admission: RE | Admit: 2011-11-08 | Discharge: 2011-11-14 | DRG: 467 | Disposition: A | Discharge status: Home / Self Care | Payer: Medicare Other | Source: Ambulatory Visit | Attending: Orthopedic Surgery | Admitting: Orthopedic Surgery

## 2011-11-08 DIAGNOSIS — Z01811 Encounter for preprocedural respiratory examination: Secondary | ICD-10-CM | POA: Diagnosis not present

## 2011-11-08 DIAGNOSIS — M25469 Effusion, unspecified knee: Secondary | ICD-10-CM | POA: Diagnosis present

## 2011-11-08 DIAGNOSIS — N390 Urinary tract infection, site not specified: Secondary | ICD-10-CM | POA: Diagnosis not present

## 2011-11-08 DIAGNOSIS — Z85850 Personal history of malignant neoplasm of thyroid: Secondary | ICD-10-CM | POA: Diagnosis not present

## 2011-11-08 DIAGNOSIS — H3530 Unspecified macular degeneration: Secondary | ICD-10-CM | POA: Diagnosis present

## 2011-11-08 DIAGNOSIS — G8918 Other acute postprocedural pain: Secondary | ICD-10-CM | POA: Diagnosis not present

## 2011-11-08 DIAGNOSIS — A498 Other bacterial infections of unspecified site: Secondary | ICD-10-CM | POA: Diagnosis present

## 2011-11-08 DIAGNOSIS — M7989 Other specified soft tissue disorders: Secondary | ICD-10-CM | POA: Diagnosis not present

## 2011-11-08 DIAGNOSIS — I10 Essential (primary) hypertension: Secondary | ICD-10-CM

## 2011-11-08 DIAGNOSIS — T8450XA Infection and inflammatory reaction due to unspecified internal joint prosthesis, initial encounter: Secondary | ICD-10-CM | POA: Diagnosis not present

## 2011-11-08 DIAGNOSIS — E871 Hypo-osmolality and hyponatremia: Secondary | ICD-10-CM | POA: Diagnosis present

## 2011-11-08 DIAGNOSIS — Y849 Medical procedure, unspecified as the cause of abnormal reaction of the patient, or of later complication, without mention of misadventure at the time of the procedure: Secondary | ICD-10-CM | POA: Diagnosis present

## 2011-11-08 DIAGNOSIS — Z96659 Presence of unspecified artificial knee joint: Secondary | ICD-10-CM

## 2011-11-08 DIAGNOSIS — Y831 Surgical operation with implant of artificial internal device as the cause of abnormal reaction of the patient, or of later complication, without mention of misadventure at the time of the procedure: Secondary | ICD-10-CM | POA: Diagnosis not present

## 2011-11-08 DIAGNOSIS — T847XXA Infection and inflammatory reaction due to other internal orthopedic prosthetic devices, implants and grafts, initial encounter: Secondary | ICD-10-CM | POA: Diagnosis not present

## 2011-11-08 DIAGNOSIS — M1710 Unilateral primary osteoarthritis, unspecified knee: Secondary | ICD-10-CM | POA: Diagnosis not present

## 2011-11-08 DIAGNOSIS — H409 Unspecified glaucoma: Secondary | ICD-10-CM | POA: Diagnosis present

## 2011-11-08 DIAGNOSIS — R0602 Shortness of breath: Secondary | ICD-10-CM

## 2011-11-08 DIAGNOSIS — J9819 Other pulmonary collapse: Secondary | ICD-10-CM | POA: Diagnosis not present

## 2011-11-08 HISTORY — DX: Pneumonia, unspecified organism: J18.9

## 2011-11-08 HISTORY — DX: Dorsalgia, unspecified: M54.9

## 2011-11-08 HISTORY — DX: Malignant neoplasm of thyroid gland: C73

## 2011-11-08 HISTORY — DX: Anemia, unspecified: D64.9

## 2011-11-08 HISTORY — DX: Personal history of other diseases of the digestive system: Z87.19

## 2011-11-08 HISTORY — DX: Shortness of breath: R06.02

## 2011-11-08 HISTORY — DX: Unspecified osteoarthritis, unspecified site: M19.90

## 2011-11-08 HISTORY — DX: Hypothyroidism, unspecified: E03.9

## 2011-11-08 HISTORY — DX: Reserved for inherently not codable concepts without codable children: IMO0001

## 2011-11-08 HISTORY — DX: Encounter for other specified aftercare: Z51.89

## 2011-11-08 HISTORY — DX: Gastro-esophageal reflux disease without esophagitis: K21.9

## 2011-11-08 HISTORY — DX: Other chronic pain: G89.29

## 2011-11-08 LAB — COMPREHENSIVE METABOLIC PANEL
ALT: 27 U/L (ref 0–35)
AST: 34 U/L (ref 0–37)
Alkaline Phosphatase: 88 U/L (ref 39–117)
CO2: 32 mEq/L (ref 19–32)
Chloride: 90 mEq/L — ABNORMAL LOW (ref 96–112)
Creatinine, Ser: 0.94 mg/dL (ref 0.50–1.10)
GFR calc non Af Amer: 54 mL/min — ABNORMAL LOW (ref >90)
Sodium: 129 mEq/L — ABNORMAL LOW (ref 135–145)
Total Bilirubin: 0.5 mg/dL (ref 0.3–1.2)

## 2011-11-08 LAB — DIFFERENTIAL
Basophils Absolute: 0 10*3/uL (ref 0.0–0.1)
Eosinophils Absolute: 0 10*3/uL (ref 0.0–0.7)
Eosinophils Relative: 0 % (ref 0–5)

## 2011-11-08 LAB — PROTIME-INR: Prothrombin Time: 15.9 seconds — ABNORMAL HIGH (ref 11.6–15.2)

## 2011-11-08 LAB — CBC
MCV: 91.8 fL (ref 78.0–100.0)
Platelets: 359 10*3/uL (ref 150–400)
RBC: 3.68 MIL/uL — ABNORMAL LOW (ref 3.87–5.11)
WBC: 8.1 10*3/uL (ref 4.0–10.5)

## 2011-11-08 MED ORDER — ACETAMINOPHEN 650 MG RE SUPP: 650.0000 mg | Freq: Four times a day (QID) | RECTAL | Status: DC | PRN

## 2011-11-08 MED ORDER — ONDANSETRON HCL 4 MG/2ML IJ SOLN: 4.0000 mg | Freq: Four times a day (QID) | INTRAMUSCULAR | Status: DC | PRN

## 2011-11-08 MED ORDER — OXYCODONE HCL 5 MG PO TABS
15.0000 mg | ORAL_TABLET | ORAL | Status: DC | PRN
  Administered 2011-11-09 – 2011-11-11 (×7): 15 mg via ORAL
  Filled 2011-11-08 (×7): qty 3

## 2011-11-08 MED ORDER — FLEET ENEMA 7-19 GM/118ML RE ENEM: 1.0000 | ENEMA | Freq: Once | RECTAL | Status: AC | PRN

## 2011-11-08 MED ORDER — DOCUSATE SODIUM 100 MG PO CAPS
100.0000 mg | ORAL_CAPSULE | Freq: Two times a day (BID) | ORAL | Status: DC
  Administered 2011-11-08 – 2011-11-14 (×11): 100 mg via ORAL
  Filled 2011-11-08 (×14): qty 1

## 2011-11-08 MED ORDER — LEVOTHYROXINE SODIUM 100 MCG PO TABS
100.0000 ug | ORAL_TABLET | Freq: Every day | ORAL | Status: DC
  Administered 2011-11-09 – 2011-11-14 (×5): 100 ug via ORAL
  Filled 2011-11-08 (×7): qty 1

## 2011-11-08 MED ORDER — WHITE PETROLATUM GEL
Status: AC
  Filled 2011-11-08: qty 5

## 2011-11-08 MED ORDER — ACETAMINOPHEN 325 MG PO TABS
650.0000 mg | ORAL_TABLET | Freq: Four times a day (QID) | ORAL | Status: DC | PRN
  Administered 2011-11-10 – 2011-11-11 (×3): 650 mg via ORAL
  Filled 2011-11-08 (×4): qty 2

## 2011-11-08 MED ORDER — HYDROMORPHONE HCL PF 1 MG/ML IJ SOLN
1.0000 mg | INTRAMUSCULAR | Status: DC | PRN
  Administered 2011-11-08: 1 mg via INTRAVENOUS
  Filled 2011-11-08: qty 1

## 2011-11-08 MED ORDER — ENOXAPARIN SODIUM 30 MG/0.3ML ~~LOC~~ SOLN
30.0000 mg | Freq: Two times a day (BID) | SUBCUTANEOUS | Status: DC
  Administered 2011-11-08 – 2011-11-09 (×3): 30 mg via SUBCUTANEOUS
  Filled 2011-11-08 (×4): qty 0.3

## 2011-11-08 MED ORDER — VANCOMYCIN HCL 1000 MG IV SOLR
1250.0000 mg | INTRAVENOUS | Status: DC
  Administered 2011-11-08 – 2011-11-12 (×5): 1250 mg via INTRAVENOUS
  Filled 2011-11-08 (×6): qty 1250

## 2011-11-08 MED ORDER — PROPRANOLOL HCL 20 MG PO TABS
20.0000 mg | ORAL_TABLET | Freq: Every day | ORAL | Status: DC
  Administered 2011-11-09 – 2011-11-14 (×5): 20 mg via ORAL
  Filled 2011-11-08 (×6): qty 1

## 2011-11-08 MED ORDER — POLYETHYLENE GLYCOL 3350 17 G PO PACK: 17.0000 g | PACK | Freq: Every day | ORAL | Status: DC | PRN

## 2011-11-08 NOTE — H&P (Signed)
[name] is an 78 y.o. female.   Chief Complaint: swollen painful right knee, fatigue, "feeling ill" HPI: 78yo female, hx of bilateral total knee arthroplasty, right knee done 03/2011, patient has had a few weeks of RLE swelling, increased pain, fevers, overall feeling ill.  Labs were drawn back in January negative for infection.  She presented to the outpatient office today with large knee effusion, RLE edema, we aspirated approximately 60cc of grossly infected pus, sent to lab for culture.    Past Medical History  Diagnosis Date  . Angina   . Headache   . Hypoglycemia   . Cancer 1986    thyroid  . Fracture 2008    back  . Macular degeneration of both eyes   . Glaucoma     Past Surgical History  Procedure Date  . Joint replacement 2005, 2006, 2012    bil knees  . Back surgery   . Fracture surgery   . Thyroidectomy   . Shoulder arthroscopy w/ rotator cuff repair   . Carpal tunnel release   . Trigger finger release   . Abdominal hysterectomy   . Carpal tunnel release 09/13/2011    Procedure: CARPAL TUNNEL RELEASE;  Surgeon: Robert V Sypher Jr., MD;  Location: [facility];  Service: Orthopedics;  Laterality: Left;    History reviewed. No pertinent family history. Social History:  reports that she has never smoked. She has never used smokeless tobacco. She reports that she does not drink alcohol or use illicit drugs.  Allergies:  Allergies  Allergen Reactions  . Demerol Shortness Of Breath  . Penicillins Swelling  . Ativan Anxiety  . Tetracyclines & Related Rash    No current facility-administered medications on file as of 11/08/2011.   Medications Prior to Admission  Medication Sig Dispense Refill  . aspirin 81 MG tablet Take 160 mg by mouth daily.        . dorzolamide-timolol (COSOPT) 22.3-6.8 MG/ML ophthalmic solution Place 1 drop into both eyes 2 (two) times daily.        . gabapentin (NEURONTIN) 300 MG capsule Take 300 mg by mouth at bedtime.         . latanoprost (XALATAN) 0.005 % ophthalmic solution Place 1 drop into both eyes at bedtime.        . levothyroxine (SYNTHROID, LEVOTHROID) 100 MCG tablet Take 100 mcg by mouth daily.        . Multiple Vitamin (MULTIVITAMIN) tablet Take 1 tablet by mouth daily.        . oxyCODONE (ROXICODONE) 15 MG immediate release tablet Take 15 mg by mouth every 4 (four) hours as needed. For back pain        . propranolol (INDERAL) 20 MG tablet Take 20 mg by mouth daily. Takes for migraines.      . triamterene-hydrochlorothiazide (DYAZIDE) 37.5-25 MG per capsule Take 0.5 capsules by mouth every morning. For fluid         No results found for this or any previous visit (from the past 48 hour(s)). No results found.  Review of Systems  Constitutional: Positive for fever, chills and malaise/fatigue.  HENT: Negative for sore throat.   Eyes:       Diminished vision, hx glaucoma and macular degeneration  Respiratory: Negative for cough, hemoptysis, sputum production, shortness of breath and wheezing.   Cardiovascular: Positive for leg swelling. Negative for chest pain and palpitations.  Gastrointestinal: Positive for nausea and constipation. Negative for vomiting, abdominal pain and diarrhea.    Genitourinary: Negative for dysuria.  Musculoskeletal: Positive for back pain and joint pain.  Skin: Negative.   Neurological: Positive for dizziness. Negative for tingling.  Endo/Heme/Allergies: Does not bruise/bleed easily.    There were no vitals taken for this visit. Physical Exam  Constitutional: She is oriented to person, place, and time. She is cooperative. She appears ill.  HENT:  Head: Normocephalic and atraumatic.  Nose: Nose normal.  Eyes: EOM are normal. Pupils are equal, round, and reactive to light.  Neck: Normal range of motion. Neck supple.  Cardiovascular: Normal rate, regular rhythm, normal heart sounds and intact distal pulses.   No murmur heard. Respiratory: Effort normal and breath sounds  normal. No respiratory distress. She has no wheezes. She exhibits no tenderness.  GI: Soft. Bowel sounds are normal. There is no tenderness. There is no guarding.  Musculoskeletal: She exhibits edema and tenderness.       Right shoulder: She exhibits decreased range of motion and tenderness.       Right hip: She exhibits swelling.       Right knee: She exhibits decreased range of motion, swelling and effusion.       Right ankle: She exhibits swelling.  Neurological: She is alert and oriented to person, place, and time.  Skin: Skin is warm. No erythema. There is pallor.  Psychiatric: Her behavior is normal.   Assessment/Plan Direct admit from outpatient clinic, ID consult, surgical I&D Sat.  IV abx per ID rec's and pharmacy dosing.  Lovenox DVT proph, hx of chronic low back pain and pain management-manage pain.    Bonnetta Allbee 11/08/2011, 3:50 PM    

## 2011-11-08 NOTE — Progress Notes (Addendum)
ANTIBIOTIC CONSULT NOTE - INITIAL  Pharmacy Consult for Vancomycin and Cefepime Indication: grossly infected right total knee, planned surgery 11/10/11  Allergies  Allergen Reactions  . Demerol Shortness Of Breath  . Penicillins Swelling  . Ativan Anxiety  . Tetracyclines & Related Rash    Patient Measurements: Height: 5\' 4"  (162.6 cm) (from anesthesia records 09/13/11) Weight: 145 lb 4.8 oz (65.908 kg) IBW/kg (Calculated) : 54.7    Vital Signs:   Intake/Output from previous day:   Intake/Output from this shift:    Labs: No results found for this basename: WBC:3,HGB:3,PLT:3,LABCREA:3,CREATININE:3 in the last 72 hours Estimated Creatinine Clearance: 44 ml/min (by C-G formula based on Cr of 0.89). on 09/12/11 No results found for this basename: VANCOTROUGH:2,VANCOPEAK:2,VANCORANDOM:2,GENTTROUGH:2,GENTPEAK:2,GENTRANDOM:2,TOBRATROUGH:2,TOBRAPEAK:2,TOBRARND:2,AMIKACINPEAK:2,AMIKACINTROU:2,AMIKACIN:2, in the last 72 hours   Microbiology: No results found for this or any previous visit (from the past 720 hour(s)).  Medical History: Past Medical History  Diagnosis Date  . Angina   . Headache   . Hypoglycemia   . Cancer 1986    thyroid  . Fracture 2008    back  . Macular degeneration of both eyes   . Glaucoma     Medications:  Prescriptions prior to admission  Medication Sig Dispense Refill  . aspirin 81 MG tablet Take 160 mg by mouth daily.        . dorzolamide-timolol (COSOPT) 22.3-6.8 MG/ML ophthalmic solution Place 1 drop into both eyes 2 (two) times daily.        Marland Kitchen gabapentin (NEURONTIN) 300 MG capsule Take 300 mg by mouth at bedtime.        Marland Kitchen latanoprost (XALATAN) 0.005 % ophthalmic solution Place 1 drop into both eyes at bedtime.        Marland Kitchen levothyroxine (SYNTHROID, LEVOTHROID) 100 MCG tablet Take 100 mcg by mouth daily.        . Multiple Vitamin (MULTIVITAMIN) tablet Take 1 tablet by mouth daily.        Marland Kitchen oxyCODONE (ROXICODONE) 15 MG immediate release tablet Take 15  mg by mouth every 4 (four) hours as needed. For back pain        . propranolol (INDERAL) 20 MG tablet Take 20 mg by mouth daily. Takes for migraines.      . triamterene-hydrochlorothiazide (DYAZIDE) 37.5-25 MG per capsule Take 0.5 capsules by mouth every morning. For fluid        Scheduled:    . docusate sodium  100 mg Oral BID  . enoxaparin  30 mg Subcutaneous Q12H   Infusions:   Anti-infectives    None     Assessment: 78 y.o. Female s/p right knee done 03/2011.  Labs in Jan. 2012 were negative for infection. Presented at outpatient office today with large knee effusion, RLE edema; aspirated ~60cc grossly infected pus, sent to lab for culture. Now to start on IV Vanc/Cefepime for right knee infection with I&D surgery planned on 11/10/11. Labs pending. Last SCR = 9.89 on 09/12/11. CrCL ~ 44 ml/min.   Allergy noted to PCN; discussed with patient. Her lips swelled with PCN when she was young. I reviewed EPIC for past antibiotic use (only found Vancomycin documented). I called [facility] who reported she had RX's for  Z-pack and Clindamycin in the past   Goal of Therapy:  Vancomycin trough level 10-15 mcg/ml  Plan:  Vancomycin 1250 mg IV q24h. Cancelled Cefepime per telephone order from Dr. Alveda Reasons due to patient is allergic to PCN, causes lip/facial swelling.   F/u cultures, renal function. Monitor steady state  Vanc trough if to continue >5 days or if renal impairment.   Arman Filter , RPh 11/08/2011,4:39 PM

## 2011-11-09 ENCOUNTER — Inpatient Hospital Stay (HOSPITAL_COMMUNITY): Payer: Medicare Other

## 2011-11-09 DIAGNOSIS — M7989 Other specified soft tissue disorders: Secondary | ICD-10-CM

## 2011-11-09 DIAGNOSIS — Y831 Surgical operation with implant of artificial internal device as the cause of abnormal reaction of the patient, or of later complication, without mention of misadventure at the time of the procedure: Secondary | ICD-10-CM

## 2011-11-09 DIAGNOSIS — T8450XA Infection and inflammatory reaction due to unspecified internal joint prosthesis, initial encounter: Secondary | ICD-10-CM

## 2011-11-09 HISTORY — PX: OTHER SURGICAL HISTORY: SHX169

## 2011-11-09 LAB — BASIC METABOLIC PANEL
Chloride: 94 mEq/L — ABNORMAL LOW (ref 96–112)
Creatinine, Ser: 0.88 mg/dL (ref 0.50–1.10)
GFR calc Af Amer: 68 mL/min — ABNORMAL LOW (ref >90)
GFR calc non Af Amer: 59 mL/min — ABNORMAL LOW (ref >90)
Potassium: 3.5 mEq/L (ref 3.5–5.1)

## 2011-11-09 LAB — CBC
Hemoglobin: 10.3 g/dL — ABNORMAL LOW (ref 12.0–15.0)
MCH: 30 pg (ref 26.0–34.0)
MCHC: 32.6 g/dL (ref 30.0–36.0)
MCV: 92.1 fL (ref 78.0–100.0)
Platelets: 312 10*3/uL (ref 150–400)
RBC: 3.43 MIL/uL — ABNORMAL LOW (ref 3.87–5.11)
WBC: 5.5 10*3/uL (ref 4.0–10.5)

## 2011-11-09 MED ORDER — SODIUM CHLORIDE 0.9 % IV SOLN
1.0000 g | INTRAVENOUS | Status: DC
  Administered 2011-11-09 – 2011-11-12 (×4): 1 g via INTRAVENOUS
  Filled 2011-11-09 (×5): qty 1

## 2011-11-09 MED ORDER — DORZOLAMIDE HCL-TIMOLOL MAL 2-0.5 % OP SOLN
1.0000 [drp] | Freq: Two times a day (BID) | OPHTHALMIC | Status: DC
  Administered 2011-11-09 (×2): 1 [drp] via OPHTHALMIC
  Filled 2011-11-09: qty 10

## 2011-11-09 MED ORDER — LATANOPROST 0.005 % OP SOLN
1.0000 [drp] | Freq: Every day | OPHTHALMIC | Status: DC
  Administered 2011-11-09 – 2011-11-13 (×6): 1 [drp] via OPHTHALMIC
  Filled 2011-11-09: qty 2.5

## 2011-11-09 MED ORDER — DORZOLAMIDE HCL-TIMOLOL MAL 2-0.5 % OP SOLN
1.0000 [drp] | OPHTHALMIC | Status: DC
  Administered 2011-11-09 – 2011-11-14 (×9): 1 [drp] via OPHTHALMIC

## 2011-11-09 MED ORDER — DIAZEPAM 2 MG PO TABS
2.0000 mg | ORAL_TABLET | Freq: Two times a day (BID) | ORAL | Status: DC | PRN
  Administered 2011-11-09 – 2011-11-13 (×5): 2 mg via ORAL
  Filled 2011-11-09 (×5): qty 1

## 2011-11-09 NOTE — Progress Notes (Signed)
VASCULAR LAB PRELIMINARY  PRELIMINARY  PRELIMINARY  PRELIMINARY  Right lower extremity venous duplex has been completed.    Preliminary report: Right leg negative for deep and superficial vein thrombosis. [name], 11/09/2011, 8:03 PM

## 2011-11-09 NOTE — Progress Notes (Signed)
Subjective:   Procedure(s) (LRB): IRRIGATION AND DEBRIDEMENT KNEE WITH POLY EXCHANGE (Right) Patient reports pain as mild and moderate.    Objective: Vital signs in last 24 hours: Temp:  [99.1 F (37.3 C)-99.8 F (37.7 C)] 99.7 F (37.6 C) (03/08 0449) Pulse Rate:  [72-79] 72  (03/08 0449) Resp:  [16-18] 18  (03/08 0449) BP: (110-116)/(47-61) 110/47 mmHg (03/08 0449) SpO2:  [94 %-100 %] 95 % (03/08 0449) Weight:  [65.908 kg (145 lb 4.8 oz)] 65.908 kg (145 lb 4.8 oz) (03/07 1500)  Intake/Output from previous day: 03/07 0701 - 03/08 0700 In: -  Out: 600 [Urine:600] Intake/Output this shift:     Basename 11/09/11 0535 11/08/11 1641  HGB 10.3* 11.3*    Basename 11/09/11 0535 11/08/11 1641  WBC 5.5 8.1  RBC 3.43* 3.68*  HCT 31.6* 33.8*  PLT 312 359    Basename 11/09/11 0535 11/08/11 1641  NA 133* 129*  K 3.5 4.2  CL 94* 90*  CO2 31 32  BUN 11 15  CREATININE 0.88 0.94  GLUCOSE 117* 124*  CALCIUM 7.8* 8.4    Basename 11/08/11 1641  LABPT --  INR 1.24    Sensation intact distally Dorsiflexion/Plantar flexion intact No cellulitis present  Assessment/Plan:   Procedure(s) (LRB): IRRIGATION AND DEBRIDEMENT KNEE WITH POLY EXCHANGE (Right) Planned OR tomorrow I&D right knee NPO after midnight tonight Hold lovenox tomorrow morning ID to see pt today appreciate antibiotic rec's (preliminary results knee aspiration done yesterday cell count WBC 76,950, Neutrophils 98%, no crystals.      Timica Marcom, Ivin Booty 11/09/2011, 7:22 AM

## 2011-11-09 NOTE — Consult Note (Signed)
ID consult note  Reason for consult: Antibiotic choice and duration  HPI: Mrs Amaker is an 78 yo patient s/p bilateral knee replacement, right knee done 03/2011. Over the past few weeks she had right leg swelling and tenderness, fevers, malaise. Labs in [place] of 2012 were negative for infection. Yesterday she presented in the clinic with large R knee effusion and right leg edema. 60 cc were asprated and sent fror Gram stain and culture. Gram stain was negative for organisms,no crystal, 76,000 WBCs.    ROS: positive for fevers, chills, fatigue, malaise, leg swelling, nausea constipation, back pain, loint pain, dizziness, diminished vision due to glaucoma and macular degeneration Negative for other symptoms  Past Medical History  Diagnosis Date  . Angina   . Headache   . Hypoglycemia   . Fracture 2008    back  . Macular degeneration of both eyes   . Glaucoma   . Pneumonia   . Shortness of breath 11/08/11    "here lately, all the time"  . Hypothyroidism   . Blood transfusion   . Anemia   . GERD (gastroesophageal reflux disease)   . H/O hiatal hernia   . Arthritis   . Chronic back pain   . Thyroid cancer 1986   Past Surgical History  Procedure Date  . Thyroidectomy   . Shoulder arthroscopy w/ rotator cuff repair   . Trigger finger release     right  thumb  . Abdominal hysterectomy   . Carpal tunnel release     bilaterally  . Tonsillectomy and adenoidectomy   . Appendectomy   . Dilation and curettage of uterus   . Cataract extraction w/ intraocular lens  implant, bilateral   . Joint replacement 2005, 2006, 2012, 11/10/11    left; left; right; right; right  . Goiter removed     "when I was young"  . Fracture surgery     "broke back"  . Back surgery     "3 back OR's"  . Hand or 09/2011    "left hand; bones were coming together"   FH: non contributory  Allergies: penicillin (facial swelling), tetracyclins  Social hx: negative for smoking or substance abuse, occ  alcohol  Objective: Vital signs in last 24 hours: Temp:  [99.1 F (37.3 C)-99.8 F (37.7 C)] 99.7 F (37.6 C) (03/08 0449) Pulse Rate:  [72-79] 74  (03/08 0938) Resp:  [16-18] 18  (03/08 0449) BP: (110-124)/(47-61) 124/58 mmHg (03/08 0938) SpO2:  [94 %-100 %] 95 % (03/08 0449) Weight:  [65.908 kg (145 lb 4.8 oz)] 65.908 kg (145 lb 4.8 oz) (03/07 1500)  Intake/Output from previous day: 03/07 0701 - 03/08 0700 In: 120 [P.O.:120] Out: 600 [Urine:600] Intake/Output this shift: Total I/O In: -  Out: 100 [Urine:100]  Physical exam  Constitutional: Alert, oriented, cooperative HEENT: EOMI CV: normal s1 s2, RRR, no m/r/g LUNGS: CTA bilaterally MUSC: right leg 2+ pitting edema , right knee tenderness without erythema, decreased ROM, arthroplasty incisions on both knees, shoulder is nontender and normal ROM  Lab Results  Basename 11/09/11 0535 11/08/11 1641  WBC 5.5 8.1  HGB 10.3* 11.3*  HCT 31.6* 33.8*  NA 133* 129*  K 3.5 4.2  CL 94* 90*  CO2 31 32  BUN 11 15  CREATININE 0.88 0.94  GLU -- --   Liver Panel  Basename 11/08/11 1641  PROT 6.3  ALBUMIN 3.0*  AST 34  ALT 27  ALKPHOS 88  BILITOT 0.5  BILIDIR --  IBILI --  Blood cultures and urine cultures show no growth to date.   Preliminary gram stain of the synovial fluid from the outpatient office: 77.000 WBCs, 98% neutrophils, no crystals, no organisms  Studies/Results: Dg Chest Port 1 View  11/09/2011  *RADIOLOGY REPORT*  Clinical Data: Preoperative chest radiograph.  PORTABLE CHEST - 1 VIEW  Comparison: 03/30/2011.  Findings: Low lung volumes.  Low volumes accentuate the cardiopericardial silhouette.  Left greater than right basilar atelectasis.  Prominent skin fold is present in the right upper chest.  Lung markings are visualized peripheral to this.  No effusion is identified. No airspace disease or edema is identified.  IMPRESSION: Low volume chest with left greater than right basilar atelectasis.  Original  Report Authenticated By: Andreas Newport, M.D.    Medications: vancomycin (VANCOCIN) 1,250 mg in sodium chloride 0.9 % 250 mL IVPB  Assessment/Plan:  1. Knee infection: Preliminary analysis of the synovial fluid is c/w septic joint.  Given allergy hx to penicillin and tetracyclines we will add ertapenem to vancomycin for extra coverage of gram negatives. Cefepime held due to severe allergy.  Plan to complete a 6 wk regimen of IV therapy minimum. Will narrow regimen if cultures grow a specific organism. A/w I and D tomorrow in the am.  Will also need long term/ permanent suppressive oral therapy likely.   Thanks for this consult, will follow along.    LOS: 1 day    Christopulos, Georgios 11/09/2011, 10:32 AM

## 2011-11-09 NOTE — Clinical Documentation Improvement (Signed)
Abnormal Labs Clarification  THIS DOCUMENT IS NOT A PERMANENT PART OF THE MEDICAL RECORD  TO RESPOND TO THE THIS QUERY, FOLLOW THE INSTRUCTIONS BELOW:  1. If needed, update documentation for the patient's encounter via the notes activity.  2. Access this query again and click edit on the [employer].  3. After updating, or not, click F2 to complete all highlighted (required) fields concerning your review. Select "additional documentation in the medical record" OR "no additional documentation provided".  4. Click Sign note button.  5. The deficiency will fall out of your InBasket *Please let us know if you are not able to complete this workflow by phone or e-mail (listed below).  Please update your documentation within the medical record to reflect your response to this query.                                                                                   11/09/11  Dear Margart Sickles PA-[facility]  In a better effort to capture your patient's severity of illness, reflect appropriate length of stay and utilization of resources, a review of the medical record has revealed the following indicators.    Based on your clinical judgment, please clarify and document in a progress note and/or discharge summary the clinical condition associated with the following supporting information:  In responding to this query please exercise your independent judgment.  The fact that a query is asked, does not imply that any particular answer is desired or expected.  Abnormal findings (laboratory, x-ray, pathologic, and other diagnostic results) are not coded and reported unless the physician indicates their clinical significance.   The medical record reflects the following clinical findings, please clarify the diagnostic and/or clinical significance:      Noted patient's NA level 129 (3/7), 133 (3/8) , and 139 (09/12/11) . Please clarify secondary diagnosis if appropriate.  Thank you    Possible  Clinical Conditions?                                            Hyponatremia  Mild Hyponatremia  Hyponatremia resolved   Other Condition___________________                   Cannot Clinically Determine_________    Treatment: no continuous  IVF noted however both IV antibiotics were mixed  in NS solution  Reviewed: additional documentation provided in discharge summary as mild hyponatremia, untreated, not included as secondary diagnosis   Thank You,  Leonette Most Addison  Clinical Documentation Specialist RN, BSN:  Pager [phone]//HIM off [phone]  [facility] [place]

## 2011-11-10 ENCOUNTER — Encounter (HOSPITAL_COMMUNITY): Payer: Self-pay | Admitting: Anesthesiology

## 2011-11-10 ENCOUNTER — Inpatient Hospital Stay (HOSPITAL_COMMUNITY): Payer: Medicare Other | Admitting: Anesthesiology

## 2011-11-10 ENCOUNTER — Encounter (HOSPITAL_COMMUNITY)
Admission: RE | Disposition: A | Discharge status: Home / Self Care | Payer: Self-pay | Source: Ambulatory Visit | Attending: Orthopedic Surgery

## 2011-11-10 HISTORY — PX: I&D KNEE WITH POLY EXCHANGE: SHX5024

## 2011-11-10 LAB — CBC
MCH: 30.3 pg (ref 26.0–34.0)
MCV: 93.7 fL (ref 78.0–100.0)
Platelets: 376 10*3/uL (ref 150–400)
RDW: 13.5 % (ref 11.5–15.5)

## 2011-11-10 LAB — URINE CULTURE: Culture  Setup Time: 201303072232

## 2011-11-10 LAB — BASIC METABOLIC PANEL
CO2: 33 mEq/L — ABNORMAL HIGH (ref 19–32)
Calcium: 8.1 mg/dL — ABNORMAL LOW (ref 8.4–10.5)
Creatinine, Ser: 0.77 mg/dL (ref 0.50–1.10)
Glucose, Bld: 100 mg/dL — ABNORMAL HIGH (ref 70–99)

## 2011-11-10 SURGERY — IRRIGATION AND DEBRIDEMENT KNEE WITH POLY EXCHANGE
Anesthesia: General | Site: Knee | Laterality: Right | Wound class: Dirty or Infected

## 2011-11-10 MED ORDER — NEOSTIGMINE METHYLSULFATE 1 MG/ML IJ SOLN
INTRAMUSCULAR | Status: DC | PRN
  Administered 2011-11-10: 4 mg via INTRAVENOUS

## 2011-11-10 MED ORDER — ONDANSETRON HCL 4 MG/2ML IJ SOLN
INTRAMUSCULAR | Status: DC | PRN
  Administered 2011-11-10: 4 mg via INTRAVENOUS

## 2011-11-10 MED ORDER — FENTANYL CITRATE 0.05 MG/ML IJ SOLN
INTRAMUSCULAR | Status: DC | PRN
  Administered 2011-11-10: 50 ug via INTRAVENOUS
  Administered 2011-11-10 (×2): 100 ug via INTRAVENOUS
  Administered 2011-11-10 (×4): 50 ug via INTRAVENOUS

## 2011-11-10 MED ORDER — TOBRAMYCIN SULFATE 1.2 G IJ SOLR
INTRAMUSCULAR | Status: DC | PRN
  Administered 2011-11-10: 2.4 g

## 2011-11-10 MED ORDER — DROPERIDOL 2.5 MG/ML IJ SOLN
0.6250 mg | INTRAMUSCULAR | Status: DC | PRN
  Filled 2011-11-10: qty 0.25

## 2011-11-10 MED ORDER — BUPIVACAINE-EPINEPHRINE PF 0.5-1:200000 % IJ SOLN
INTRAMUSCULAR | Status: DC | PRN
  Administered 2011-11-10: 150 mg

## 2011-11-10 MED ORDER — PROPOFOL 10 MG/ML IV BOLUS
INTRAVENOUS | Status: DC | PRN
  Administered 2011-11-10: 140 mg via INTRAVENOUS

## 2011-11-10 MED ORDER — TOBRAMYCIN SULFATE 1.2 G IJ SOLR
2.4000 g | INTRAMUSCULAR | Status: DC
  Filled 2011-11-10: qty 2.4

## 2011-11-10 MED ORDER — HYDROMORPHONE HCL PF 1 MG/ML IJ SOLN
0.2500 mg | INTRAMUSCULAR | Status: DC | PRN
  Administered 2011-11-10 (×2): 0.5 mg via INTRAVENOUS

## 2011-11-10 MED ORDER — HYDROMORPHONE HCL PF 1 MG/ML IJ SOLN
1.0000 mg | INTRAMUSCULAR | Status: DC | PRN
  Administered 2011-11-10 – 2011-11-11 (×3): 1 mg via INTRAVENOUS
  Filled 2011-11-10 (×3): qty 1

## 2011-11-10 MED ORDER — SODIUM CHLORIDE 0.9 % IR SOLN
Status: DC | PRN
  Administered 2011-11-10: 6000 mL

## 2011-11-10 MED ORDER — ROCURONIUM BROMIDE 100 MG/10ML IV SOLN
INTRAVENOUS | Status: DC | PRN
  Administered 2011-11-10: 40 mg via INTRAVENOUS

## 2011-11-10 MED ORDER — VANCOMYCIN HCL 1000 MG IV SOLR
2000.0000 mg | INTRAVENOUS | Status: DC
  Filled 2011-11-10: qty 2000

## 2011-11-10 MED ORDER — GLYCOPYRROLATE 0.2 MG/ML IJ SOLN
INTRAMUSCULAR | Status: DC | PRN
  Administered 2011-11-10: .5 mg via INTRAVENOUS

## 2011-11-10 MED ORDER — LACTATED RINGERS IV SOLN
INTRAVENOUS | Status: DC | PRN
  Administered 2011-11-10 (×2): via INTRAVENOUS

## 2011-11-10 MED ORDER — VANCOMYCIN HCL 1000 MG IV SOLR
INTRAVENOUS | Status: DC | PRN
  Administered 2011-11-10: 2 g

## 2011-11-10 SURGICAL SUPPLY — 56 items
BANDAGE ELASTIC 4 VELCRO ST LF (GAUZE/BANDAGES/DRESSINGS) ×2 IMPLANT
BANDAGE ELASTIC 6 VELCRO ST LF (GAUZE/BANDAGES/DRESSINGS) ×2 IMPLANT
BANDAGE ESMARK 6X9 LF (GAUZE/BANDAGES/DRESSINGS) ×1 IMPLANT
BANDAGE GAUZE ELAST BULKY 4 IN (GAUZE/BANDAGES/DRESSINGS) ×2 IMPLANT
BLADE SURG ROTATE 9660 (MISCELLANEOUS) IMPLANT
BNDG ESMARK 6X9 LF (GAUZE/BANDAGES/DRESSINGS) ×2
BOWL SMART MIX CTS (DISPOSABLE) IMPLANT
CLOTH BEACON ORANGE TIMEOUT ST (SAFETY) ×2 IMPLANT
COVER BACK TABLE 24X17X13 BIG (DRAPES) IMPLANT
COVER SURGICAL LIGHT HANDLE (MISCELLANEOUS) ×2 IMPLANT
CUFF TOURNIQUET SINGLE 34IN LL (TOURNIQUET CUFF) ×2 IMPLANT
CUFF TOURNIQUET SINGLE 44IN (TOURNIQUET CUFF) IMPLANT
DRAPE EXTREMITY T 121X128X90 (DRAPE) ×2 IMPLANT
DRAPE INCISE IOBAN 66X45 STRL (DRAPES) IMPLANT
DRAPE U-SHAPE 47X51 STRL (DRAPES) ×2 IMPLANT
DRSG ADAPTIC 3X8 NADH LF (GAUZE/BANDAGES/DRESSINGS) ×2 IMPLANT
DRSG PAD ABDOMINAL 8X10 ST (GAUZE/BANDAGES/DRESSINGS) ×4 IMPLANT
DURAPREP 26ML APPLICATOR (WOUND CARE) ×2 IMPLANT
ELECT REM PT RETURN 9FT ADLT (ELECTROSURGICAL) ×2
ELECTRODE REM PT RTRN 9FT ADLT (ELECTROSURGICAL) ×1 IMPLANT
EVACUATOR 1/8 PVC DRAIN (DRAIN) ×2 IMPLANT
FACESHIELD LNG OPTICON STERILE (SAFETY) ×4 IMPLANT
GLOVE BIO SURGEON STRL SZ7.5 (GLOVE) ×4 IMPLANT
GLOVE BIOGEL M 8.0 STRL (GLOVE) ×2 IMPLANT
GLOVE BIOGEL PI IND STRL 8 (GLOVE) ×2 IMPLANT
GLOVE BIOGEL PI INDICATOR 8 (GLOVE) ×2
GLOVE NEODERM STER SZ 7 (GLOVE) ×2 IMPLANT
GLOVE ORTHO TXT STRL SZ7.5 (GLOVE) ×2 IMPLANT
GLOVE SURG ORTHO 8.0 STRL STRW (GLOVE) ×6 IMPLANT
GOWN PREVENTION PLUS XLARGE (GOWN DISPOSABLE) ×4 IMPLANT
GOWN STRL NON-REIN LRG LVL3 (GOWN DISPOSABLE) ×2 IMPLANT
HANDPIECE INTERPULSE COAX TIP (DISPOSABLE) ×1
INSERT TIBIAL PFC SIG SZ3 10MM (Knees) ×2 IMPLANT
KIT BASIN OR (CUSTOM PROCEDURE TRAY) ×2 IMPLANT
KIT ROOM TURNOVER OR (KITS) ×2 IMPLANT
KIT STIMULAN RAPID CURE  10CC (Orthopedic Implant) ×2 IMPLANT
KIT STIMULAN RAPID CURE 10CC (Orthopedic Implant) ×2 IMPLANT
MANIFOLD NEPTUNE II (INSTRUMENTS) ×2 IMPLANT
NS IRRIG 1000ML POUR BTL (IV SOLUTION) ×2 IMPLANT
PACK TOTAL JOINT (CUSTOM PROCEDURE TRAY) ×2 IMPLANT
PAD ARMBOARD 7.5X6 YLW CONV (MISCELLANEOUS) ×4 IMPLANT
PAD CAST 4YDX4 CTTN HI CHSV (CAST SUPPLIES) IMPLANT
PADDING CAST COTTON 4X4 STRL (CAST SUPPLIES)
SET HNDPC FAN SPRY TIP SCT (DISPOSABLE) ×1 IMPLANT
SPONGE GAUZE 4X4 12PLY (GAUZE/BANDAGES/DRESSINGS) ×2 IMPLANT
STAPLER VISISTAT 35W (STAPLE) ×2 IMPLANT
STRIP CLOSURE SKIN 1/2X4 (GAUZE/BANDAGES/DRESSINGS) ×4 IMPLANT
SUCTION FRAZIER TIP 10 FR DISP (SUCTIONS) IMPLANT
SUT MNCRL AB 3-0 PS2 18 (SUTURE) IMPLANT
SUT PDS AB 0 CT 36 (SUTURE) ×2 IMPLANT
SUT VIC AB 0 CTB1 27 (SUTURE) ×2 IMPLANT
SUT VIC AB 2-0 CTB1 (SUTURE) ×4 IMPLANT
TOWEL OR 17X24 6PK STRL BLUE (TOWEL DISPOSABLE) ×2 IMPLANT
TOWEL OR 17X26 10 PK STRL BLUE (TOWEL DISPOSABLE) ×2 IMPLANT
TRAY FOLEY CATH 14FR (SET/KITS/TRAYS/PACK) IMPLANT
WATER STERILE IRR 1000ML POUR (IV SOLUTION) IMPLANT

## 2011-11-10 NOTE — Anesthesia Procedure Notes (Signed)
Anesthesia Regional Block:  Femoral nerve block  Pre-Anesthetic Checklist: ,, timeout performed, Correct Patient, Correct Site, Correct Laterality, Correct Procedure,, site marked, risks and benefits discussed, Surgical consent,  Pre-op evaluation,  At surgeon's request and post-op pain management  Laterality: Right  Prep: chloraprep       Needles:  Injection technique: Single-shot  Needle Type: Echogenic Stimulator Needle     Needle Length: 5cm 5 cm Needle Gauge: 22 and 22 G    Additional Needles:  Procedures: ultrasound guided and nerve stimulator Femoral nerve block  Nerve Stimulator or Paresthesia:  Response: quadraceps contraction, 0.45 mA,   Additional Responses:   Narrative:  Start time: 11/10/2011 7:12 AM End time: 11/10/2011 7:22 AM Injection made incrementally with aspirations every 5 mL.  Performed by: Personally  Anesthesiologist: Halford Decamp, MD  Additional Notes: Functioning IV was confirmed and monitors were applied.  A 50mm 22ga Arrow echogenic stimulator needle was used. Sterile prep and drape,hand hygiene and sterile gloves were used. Ultrasound guidance: relevent anatomy identified, needle position confirmed, local anesthetic spread visualized around nerve(s)., vascular puncture avoided.  Image printed for medical record. Negative aspiration and negative test dose prior to incremental administration of local anesthetic. The patient tolerated the procedure well.    Femoral nerve block

## 2011-11-10 NOTE — Progress Notes (Signed)
Orthopedic Tech Progress Note Patient Details:  [name] [birth year] [id number]  CPM Right Knee CPM Right Knee: On Right Knee Flexion (Degrees): 30  Right Knee Extension (Degrees): 0  Additional Comments: 10:20a   Nithin Demeo T 11/10/2011, 10:28 AM

## 2011-11-10 NOTE — H&P (View-Only) (Signed)
[name] is an 78 y.o. female.   Chief Complaint: swollen painful right knee, fatigue, "feeling ill" HPI: 78yo female, hx of bilateral total knee arthroplasty, right knee done 03/2011, patient has had a few weeks of RLE swelling, increased pain, fevers, overall feeling ill.  Labs were drawn back in January negative for infection.  She presented to the outpatient office today with large knee effusion, RLE edema, we aspirated approximately 60cc of grossly infected pus, sent to lab for culture.    Past Medical History  Diagnosis Date  . Angina   . Headache   . Hypoglycemia   . Cancer 1986    thyroid  . Fracture 2008    back  . Macular degeneration of both eyes   . Glaucoma     Past Surgical History  Procedure Date  . Joint replacement 2005, 2006, 2012    bil knees  . Back surgery   . Fracture surgery   . Thyroidectomy   . Shoulder arthroscopy w/ rotator cuff repair   . Carpal tunnel release   . Trigger finger release   . Abdominal hysterectomy   . Carpal tunnel release 09/13/2011    Procedure: CARPAL TUNNEL RELEASE;  Surgeon: Wyn Forster., MD;  Location: [facility];  Service: Orthopedics;  Laterality: Left;    History reviewed. No pertinent family history. Social History:  reports that she has never smoked. She has never used smokeless tobacco. She reports that she does not drink alcohol or use illicit drugs.  Allergies:  Allergies  Allergen Reactions  . Demerol Shortness Of Breath  . Penicillins Swelling  . Ativan Anxiety  . Tetracyclines & Related Rash    No current facility-administered medications on file as of 11/08/2011.   Medications Prior to Admission  Medication Sig Dispense Refill  . aspirin 81 MG tablet Take 160 mg by mouth daily.        . dorzolamide-timolol (COSOPT) 22.3-6.8 MG/ML ophthalmic solution Place 1 drop into both eyes 2 (two) times daily.        Marland Kitchen gabapentin (NEURONTIN) 300 MG capsule Take 300 mg by mouth at bedtime.         Marland Kitchen latanoprost (XALATAN) 0.005 % ophthalmic solution Place 1 drop into both eyes at bedtime.        Marland Kitchen levothyroxine (SYNTHROID, LEVOTHROID) 100 MCG tablet Take 100 mcg by mouth daily.        . Multiple Vitamin (MULTIVITAMIN) tablet Take 1 tablet by mouth daily.        Marland Kitchen oxyCODONE (ROXICODONE) 15 MG immediate release tablet Take 15 mg by mouth every 4 (four) hours as needed. For back pain        . propranolol (INDERAL) 20 MG tablet Take 20 mg by mouth daily. Takes for migraines.      . triamterene-hydrochlorothiazide (DYAZIDE) 37.5-25 MG per capsule Take 0.5 capsules by mouth every morning. For fluid         No results found for this or any previous visit (from the past 48 hour(s)). No results found.  Review of Systems  Constitutional: Positive for fever, chills and malaise/fatigue.  HENT: Negative for sore throat.   Eyes:       Diminished vision, hx glaucoma and macular degeneration  Respiratory: Negative for cough, hemoptysis, sputum production, shortness of breath and wheezing.   Cardiovascular: Positive for leg swelling. Negative for chest pain and palpitations.  Gastrointestinal: Positive for nausea and constipation. Negative for vomiting, abdominal pain and diarrhea.  Genitourinary: Negative for dysuria.  Musculoskeletal: Positive for back pain and joint pain.  Skin: Negative.   Neurological: Positive for dizziness. Negative for tingling.  Endo/Heme/Allergies: Does not bruise/bleed easily.    There were no vitals taken for this visit. Physical Exam  Constitutional: She is oriented to person, place, and time. She is cooperative. She appears ill.  HENT:  Head: Normocephalic and atraumatic.  Nose: Nose normal.  Eyes: EOM are normal. Pupils are equal, round, and reactive to light.  Neck: Normal range of motion. Neck supple.  Cardiovascular: Normal rate, regular rhythm, normal heart sounds and intact distal pulses.   No murmur heard. Respiratory: Effort normal and breath sounds  normal. No respiratory distress. She has no wheezes. She exhibits no tenderness.  GI: Soft. Bowel sounds are normal. There is no tenderness. There is no guarding.  Musculoskeletal: She exhibits edema and tenderness.       Right shoulder: She exhibits decreased range of motion and tenderness.       Right hip: She exhibits swelling.       Right knee: She exhibits decreased range of motion, swelling and effusion.       Right ankle: She exhibits swelling.  Neurological: She is alert and oriented to person, place, and time.  Skin: Skin is warm. No erythema. There is pallor.  Psychiatric: Her behavior is normal.   Assessment/Plan Direct admit from outpatient clinic, ID consult, surgical I&D Sat.  IV abx per ID rec's and pharmacy dosing.  Lovenox DVT proph, hx of chronic low back pain and pain management-manage pain.    Margart Sickles 11/08/2011, 3:50 PM

## 2011-11-10 NOTE — Anesthesia Postprocedure Evaluation (Signed)
Anesthesia Post Note  Patient: [name]  Procedure(s) Performed: Procedure(s) (LRB): IRRIGATION AND DEBRIDEMENT KNEE WITH POLY EXCHANGE (Right)  Anesthesia type: general  Patient location: PACU  Post pain: Pain level controlled  Post assessment: Patient's Cardiovascular Status Stable  Last Vitals:  Filed Vitals:   11/10/11 1031  BP:   Pulse: 59  Temp:   Resp: 17    Post vital signs: Reviewed and stable  Level of consciousness: sedated  Complications: No apparent anesthesia complications

## 2011-11-10 NOTE — Preoperative (Signed)
Beta Blockers   Reason not to administer Beta Blockers:BB given intraop (esmolol)

## 2011-11-10 NOTE — Interval H&P Note (Signed)
History and Physical Interval Note:  11/10/2011 7:38 AM  [name]  has presented today for surgery, with the diagnosis of INFECTED RIGHT TOTAL KNEE   The various methods of treatment have been discussed with the patient and family. After consideration of risks, benefits and other options for treatment, the patient has consented to  Procedure(s) (LRB): IRRIGATION AND DEBRIDEMENT KNEE WITH POLY EXCHANGE (Right) as a surgical intervention .  The patients' history has been reviewed, patient examined, no change in status, stable for surgery.  I have reviewed the patients' chart and labs.  Questions were answered to the patient's satisfaction.     Shea Kapur JR,W D

## 2011-11-10 NOTE — Op Note (Signed)
[name], [name]:  [id number]  MEDICAL RECORD NO.:  [id number]  LOCATION:  5001                         FACILITY:  [facility]  PHYSICIAN:  Dyke Brackett, M.D.    DATE OF BIRTH:  1925/04/01  DATE OF PROCEDURE:  11/10/2011 DATE OF DISCHARGE:                              OPERATIVE REPORT   INDICATIONS:  This is an 78 year old who presented to the office with pyarthrosis of her knee status post total knee, thought to be amenable to the irrigation and debridement, poly exchange.  PREOPERATIVE DIAGNOSIS:  Pyarthrosis, right total knee.  POSTOPERATIVE DIAGNOSIS:  Pyarthrosis, right total knee.  OPERATION: 1. Irrigation and debridement of right knee. 2. Tibial poly exchange. 3. Insertion of stimulon, calcium phosphate, calcium sulfate,     antibiotic-impregnated beads.  SURGEON:  Dyke Brackett, M.D.  ASSISTANT:  Margart Sickles, PA-C.  ANESTHESIA:  General anesthetic with femoral nerve block.  TOURNIQUET TIME:  Approximately 40 minutes.  PROCEDURE:  We passed in some calcium sulfate beads.  We did 2 batches of 10 mL beads; in each batch of 10 mL, we infused 1 g of vancomycin, 1 g of tobramycin greater local antibiotic delivery system into the knee. These were prepared with the appropriate size beads and allowed to harden on the back table.  Tourniquet was inflated after elevating the leg without exsanguination to 350.  We performed a medial parapatellar approach to the knee.  Encountered extensive purulent material, evacuated this, sent it for culture.  Once we exposed the knee, there was extensive synovial reaction.  We did an aggressive synovectomy with debridement of hyperemic inflamed infected tissue.  There was some softening of the bone, but there was no obvious gross loosening.  We truncated the poly on the tibial plateau, and then irrigated a total of about 7-8,000 mL of fluid.  We then did a poly exchange.  Then, these absorbable  antibiotic-impregnated beads were placed into the knee. Hemovac drain was placed.  Closure was effected with #1 and 0 PDS on the capsular tissues, and then 0 and 2-0 Vicryl in the subcutaneous tissues.  Staples were used on the skin, lightly pressure sterile dressing applied.  No local was used.  Dr. Krista Blue is doing a nerve block.     Dyke Brackett, M.D.     WDC/MEDQ  D:  11/10/2011  T:  11/10/2011  Job:  [id number]

## 2011-11-10 NOTE — Anesthesia Preprocedure Evaluation (Addendum)
Anesthesia Evaluation  Patient identified by MRN, date of birth, ID band Patient awake    Reviewed: Allergy & Precautions, H&P , NPO status , Patient's Chart, lab work & pertinent test results, reviewed documented beta blocker date and time   Airway Mallampati: II TM Distance: >3 FB Neck ROM: Full    Dental  (+) Teeth Intact and Dental Advisory Given   Pulmonary shortness of breath and with exertion,    Pulmonary exam normal       Cardiovascular hypertension, Pt. on medications and Pt. on home beta blockers + angina with exertion     Neuro/Psych  Headaches,    GI/Hepatic Neg liver ROS, hiatal hernia, GERD-  Controlled,  Endo/Other  Hypothyroidism   Renal/GU negative Renal ROS     Musculoskeletal   Abdominal   Peds  Hematology   Anesthesia Other Findings   Reproductive/Obstetrics                          Anesthesia Physical Anesthesia Plan  ASA: III  Anesthesia Plan: General   Post-op Pain Management: MAC Combined w/ Regional for Post-op pain   Induction:   Airway Management Planned: Oral ETT  Additional Equipment:   Intra-op Plan:   Post-operative Plan: Extubation in OR  Informed Consent: I have reviewed the patients History and Physical, chart, labs and discussed the procedure including the risks, benefits and alternatives for the proposed anesthesia with the patient or authorized representative who has indicated his/her understanding and acceptance.   Dental advisory given  Plan Discussed with: Anesthesiologist, Surgeon and CRNA  Anesthesia Plan Comments:        Anesthesia Quick Evaluation

## 2011-11-10 NOTE — Op Note (Signed)
Dictated [phone]

## 2011-11-10 NOTE — Transfer of Care (Signed)
Immediate Anesthesia Transfer of Care Note  Patient: [name]  Procedure(s) Performed: Procedure(s) (LRB): IRRIGATION AND DEBRIDEMENT KNEE WITH POLY EXCHANGE (Right)  Patient Location: PACU  Anesthesia Type: General  Level of Consciousness: awake and alert   Airway & Oxygen Therapy: Patient Spontanous Breathing and Patient connected to face mask oxygen  Post-op Assessment: Report given to PACU RN and Post -op Vital signs reviewed and stable  Post vital signs: Reviewed and stable  Complications: No apparent anesthesia complications

## 2011-11-11 LAB — CBC
HCT: 33.5 % — ABNORMAL LOW (ref 36.0–46.0)
MCHC: 31.9 g/dL (ref 30.0–36.0)
Platelets: 416 10*3/uL — ABNORMAL HIGH (ref 150–400)
RDW: 13.6 % (ref 11.5–15.5)
WBC: 7.3 10*3/uL (ref 4.0–10.5)

## 2011-11-11 LAB — BASIC METABOLIC PANEL
BUN: 9 mg/dL (ref 6–23)
Calcium: 8.6 mg/dL (ref 8.4–10.5)
Creatinine, Ser: 0.79 mg/dL (ref 0.50–1.10)
GFR calc Af Amer: 86 mL/min — ABNORMAL LOW (ref >90)
GFR calc non Af Amer: 74 mL/min — ABNORMAL LOW (ref >90)

## 2011-11-11 MED ORDER — OXYCODONE HCL 5 MG PO TABS
5.0000 mg | ORAL_TABLET | ORAL | Status: DC | PRN
  Administered 2011-11-11: 10 mg via ORAL
  Administered 2011-11-11: 5 mg via ORAL
  Administered 2011-11-12 – 2011-11-14 (×6): 10 mg via ORAL
  Administered 2011-11-14: 5 mg via ORAL
  Administered 2011-11-14 (×2): 10 mg via ORAL
  Filled 2011-11-11 (×4): qty 2
  Filled 2011-11-11: qty 1
  Filled 2011-11-11 (×3): qty 2
  Filled 2011-11-11: qty 1
  Filled 2011-11-11 (×2): qty 2

## 2011-11-11 MED ORDER — OXYCODONE HCL 15 MG PO TB12
30.0000 mg | ORAL_TABLET | Freq: Two times a day (BID) | ORAL | Status: DC
  Administered 2011-11-11 – 2011-11-12 (×3): 30 mg via ORAL
  Administered 2011-11-13 (×2): 15 mg via ORAL
  Administered 2011-11-14: 30 mg via ORAL
  Filled 2011-11-11: qty 2
  Filled 2011-11-11 (×2): qty 1
  Filled 2011-11-11: qty 2
  Filled 2011-11-11 (×3): qty 1
  Filled 2011-11-11: qty 2

## 2011-11-11 MED ORDER — OXYCODONE HCL 5 MG PO TABS
5.0000 mg | ORAL_TABLET | ORAL | Status: DC | PRN
  Administered 2011-11-11: 5 mg via ORAL
  Filled 2011-11-11: qty 1

## 2011-11-11 MED ORDER — GABAPENTIN 300 MG PO CAPS
300.0000 mg | ORAL_CAPSULE | Freq: Every day | ORAL | Status: DC
  Administered 2011-11-11 – 2011-11-13 (×3): 300 mg via ORAL
  Filled 2011-11-11 (×4): qty 1

## 2011-11-11 MED ORDER — ENOXAPARIN SODIUM 30 MG/0.3ML ~~LOC~~ SOLN
30.0000 mg | Freq: Two times a day (BID) | SUBCUTANEOUS | Status: DC
  Administered 2011-11-11 – 2011-11-14 (×7): 30 mg via SUBCUTANEOUS
  Filled 2011-11-11 (×9): qty 0.3

## 2011-11-11 MED ORDER — DIAZEPAM 5 MG PO TABS
5.0000 mg | ORAL_TABLET | Freq: Every evening | ORAL | Status: DC | PRN
  Administered 2011-11-11: 5 mg via ORAL
  Filled 2011-11-11 (×2): qty 1

## 2011-11-11 MED ORDER — OXYCODONE HCL 15 MG PO TB12
15.0000 mg | ORAL_TABLET | Freq: Two times a day (BID) | ORAL | Status: DC
  Administered 2011-11-11: 15 mg via ORAL
  Filled 2011-11-11: qty 1

## 2011-11-11 NOTE — Progress Notes (Signed)
Physical Therapy Evaluation Patient Details Name: [name] MRN: [id number] DOB: [birth year] Today's Date: 11/11/2011  Problem List:  Patient Active Problem List  Diagnoses  . Hypertension    Past Medical History:  Past Medical History  Diagnosis Date  . Angina   . Headache   . Hypoglycemia   . Fracture 2008    back  . Macular degeneration of both eyes   . Glaucoma   . Pneumonia   . Shortness of breath 11/08/11    "here lately, all the time"  . Hypothyroidism   . Blood transfusion   . Anemia   . GERD (gastroesophageal reflux disease)   . H/O hiatal hernia   . Arthritis   . Chronic back pain   . Thyroid cancer 1986   Past Surgical History:  Past Surgical History  Procedure Date  . Thyroidectomy   . Shoulder arthroscopy w/ rotator cuff repair   . Trigger finger release     right  thumb  . Abdominal hysterectomy   . Carpal tunnel release     bilaterally  . Tonsillectomy and adenoidectomy   . Appendectomy   . Dilation and curettage of uterus   . Cataract extraction w/ intraocular lens  implant, bilateral   . Joint replacement 2005, 2006, 2012, 11/10/11    left; left; right; right; right  . Goiter removed     "when I was young"  . Fracture surgery     "broke back"  . Back surgery     "3 back OR's"  . Hand or 09/2011    "left hand; bones were coming together"    PT Assessment/Plan/Recommendation PT Assessment Clinical Impression Statement: 78 yo female s/p I&D right knee with poly exchange presents to PT with decr functional mobility; will benefit from PT to maximize independence and safety with mobility, amb, steps to enable safe dc home PT Recommendation/Assessment: Patient will need skilled PT in the acute care venue PT Problem List: Decreased strength;Decreased range of motion;Decreased activity tolerance;Decreased balance;Decreased mobility;Decreased knowledge of use of DME;Pain PT Therapy Diagnosis : Difficulty walking;Acute pain PT Plan PT Frequency:  7X/week PT Treatment/Interventions: DME instruction;Gait training;Stair training;Functional mobility training;Therapeutic activities;Therapeutic exercise;Patient/family education PT Recommendation Recommendations for Other Services: OT consult Follow Up Recommendations: Home health PT Equipment Recommended: None recommended by PT (pretty well-equipped) PT Goals  Acute Rehab PT Goals PT Goal Formulation: With patient Time For Goal Achievement: 7 days Pt will go Supine/Side to Sit: with modified independence PT Goal: Supine/Side to Sit - Progress: Goal set today Pt will go Sit to Supine/Side: with modified independence PT Goal: Sit to Supine/Side - Progress: Goal set today Pt will go Sit to Stand: with modified independence PT Goal: Sit to Stand - Progress: Goal set today Pt will go Stand to Sit: with modified independence PT Goal: Stand to Sit - Progress: Goal set today Pt will Ambulate: >150 feet;with modified independence;with rolling walker PT Goal: Ambulate - Progress: Goal set today Pt will Go Up / Down Stairs: 1-2 stairs;with modified independence;with rail(s) PT Goal: Up/Down Stairs - Progress: Goal set today Pt will Perform Home Exercise Program: Independently PT Goal: Perform Home Exercise Program - Progress: Goal set today  PT Evaluation Precautions/Restrictions  Precautions Precautions: Knee Restrictions Weight Bearing Restrictions: Yes LLE Weight Bearing: Weight bearing as tolerated Prior Functioning  Home Living Lives With: Spouse Receives Help From: Family Type of Home: House Home Layout: One level Home Access: Stairs to enter Entrance Stairs-Rails: Right Entrance Stairs-Number of Steps: 2  Bathroom Shower/Tub: Insurance underwriter: Yes How Accessible: Accessible via walker Home Adaptive Equipment: Bedside commode/3-in-1;Built-in shower seat;Walker - rolling Additional Comments: Pt would like to be completely independent, and not need to  depend on husband for anything Prior Function Level of Independence: Independent with homemaking with ambulation Cognition Cognition Arousal/Alertness: Awake/alert Overall Cognitive Status: Appears within functional limits for tasks assessed Orientation Level: Oriented X4 Sensation/Coordination Sensation Light Touch: Appears Intact Coordination Gross Motor Movements are Fluid and Coordinated: Yes Extremity Assessment RUE Assessment RUE Assessment: Within Functional Limits LUE Assessment LUE Assessment: Within Functional Limits RLE Assessment RLE Assessment: Exceptions to [facility] RLE Strength RLE Overall Strength Comments: Grossly decr AROM and strength limited by pain; definite muscle guarding present, only able to flex to apporx 20deg LLE Assessment LLE Assessment: Within Functional Limits Mobility (including Balance) Bed Mobility Bed Mobility: Yes Supine to Sit: 4: Min assist;HOB flat;With rails Supine to Sit Details (indicate cue type and reason): min assist for LLE Transfers Transfers: Yes Sit to Stand: 4: Min assist;From bed Sit to Stand Details (indicate cue type and reason): cues for hand plaement and safe technique Stand to Sit: 4: Min assist;With upper extremity assist;To chair/3-in-1 Stand to Sit Details: pt showed very good control of descent with UE assist on armrests and cues Ambulation/Gait Ambulation/Gait: Yes Ambulation/Gait Assistance: 4: Min assist Ambulation/Gait Assistance Details (indicate cue type and reason): cues for sequence, and to monitor for L stance stability Ambulation Distance (Feet): 30 Feet Assistive device: Rolling walker Gait Pattern: Step-to pattern    Exercise  Total Joint Exercises Quad Sets: AROM;Right;5 reps;Supine Heel Slides: AAROM;Right;5 reps;Supine End of Session PT - End of Session Equipment Utilized During Treatment: Gait belt Activity Tolerance: Patient limited by pain;Patient tolerated treatment well Patient left: in  chair;with call bell in reach Nurse Communication: Mobility status for transfers;Mobility status for ambulation General Behavior During Session: [facility] for tasks performed Cognition: [facility] for tasks performed  Van Clines [facility] [place], [place] [phone]  11/11/2011, 5:34 PM

## 2011-11-11 NOTE — Progress Notes (Signed)
ANTIBIOTIC CONSULT NOTE - FOLLOW UP  Pharmacy Consult for Vancomycin Indication: Knee infection  Allergies  Allergen Reactions  . Ativan Anxiety  . Demerol Shortness Of Breath  . Penicillins Swelling    "in the face"  . Tetracyclines & Related Rash    Patient Measurements: Height: 5\' 4"  (162.6 cm) (from anesthesia records 09/13/11) Weight: 145 lb 4.8 oz (65.908 kg) IBW/kg (Calculated) : 54.7  Adjusted Body Weight:    Vital Signs: Temp: 99.1 F (37.3 C) (03/10 0613) BP: 115/51 mmHg (03/10 0613) Pulse Rate: 65  (03/10 0613) Intake/Output from previous day: 03/09 0701 - 03/10 0700 In: 1000 [I.V.:1000] Out: 75 [Blood:75] Intake/Output from this shift:    Labs:  Basename 11/11/11 0700 11/10/11 0630 11/09/11 0535  WBC 7.3 6.6 5.5  HGB 10.7* 10.5* 10.3*  PLT 416* 376 312  LABCREA -- -- --  CREATININE 0.79 0.77 0.88   Estimated Creatinine Clearance: 48.9 ml/min (by C-G formula based on Cr of 0.79). No results found for this basename: VANCOTROUGH:2,VANCOPEAK:2,VANCORANDOM:2,GENTTROUGH:2,GENTPEAK:2,GENTRANDOM:2,TOBRATROUGH:2,TOBRAPEAK:2,TOBRARND:2,AMIKACINPEAK:2,AMIKACINTROU:2,AMIKACIN:2, in the last 72 hours   Microbiology: Recent Results (from the past 720 hour(s))  CULTURE, BLOOD (ROUTINE X 2)     Status: Normal (Preliminary result)   Collection Time   11/08/11  4:30 PM      Component Value Range Status Comment   Specimen Description BLOOD HAND RIGHT   Final    Special Requests BOTTLES DRAWN AEROBIC ONLY 5CC   Final    Culture  Setup Time [id number]   Final    Culture     Final    Value:        BLOOD CULTURE RECEIVED NO GROWTH TO DATE CULTURE WILL BE HELD FOR 5 DAYS BEFORE ISSUING A FINAL NEGATIVE REPORT   Report Status PENDING   Incomplete   CULTURE, BLOOD (ROUTINE X 2)     Status: Normal (Preliminary result)   Collection Time   11/08/11  4:45 PM      Component Value Range Status Comment   Specimen Description BLOOD ARM LEFT   Final    Special Requests     Final      Value: BOTTLES DRAWN AEROBIC AND ANAEROBIC 8CC AER 6CC ANA   Culture  Setup Time [id number]   Final    Culture     Final    Value:        BLOOD CULTURE RECEIVED NO GROWTH TO DATE CULTURE WILL BE HELD FOR 5 DAYS BEFORE ISSUING A FINAL NEGATIVE REPORT   Report Status PENDING   Incomplete   URINE CULTURE     Status: Normal   Collection Time   11/08/11 10:21 PM      Component Value Range Status Comment   Specimen Description URINE, CLEAN CATCH   Final    Special Requests NONE   Final    Culture  Setup Time [id number]   Final    Colony Count >=100,000 COLONIES/ML   Final    Culture ESCHERICHIA COLI   Final    Report Status 11/10/2011 FINAL   Final    Organism ID, Bacteria ESCHERICHIA COLI   Final   ANAEROBIC CULTURE     Status: Normal (Preliminary result)   Collection Time   11/10/11  9:15 AM      Component Value Range Status Comment   Specimen Description ABSCESS RIGHT KNEE   Final    Special Requests NONE   Final    Gram Stain PENDING   Incomplete    Culture  Final    Value: NO ANAEROBES ISOLATED; CULTURE IN PROGRESS FOR 5 DAYS   Report Status PENDING   Incomplete   CULTURE, ROUTINE-ABSCESS     Status: Normal (Preliminary result)   Collection Time   11/10/11  9:15 AM      Component Value Range Status Comment   Specimen Description ABSCESS RIGHT KNEE   Final    Special Requests NONE   Final    Gram Stain PENDING   Incomplete    Culture NO GROWTH 1 DAY   Final    Report Status PENDING   Incomplete     Anti-infectives     Start     Dose/Rate Route Frequency Ordered Stop   11/10/11 0849   vancomycin (VANCOCIN) powder  Status:  Discontinued          As needed 11/10/11 0849 11/10/11 0943   11/10/11 0848   tobramycin (NEBCIN) powder  Status:  Discontinued          As needed 11/10/11 0849 11/10/11 0943   11/10/11 0730   vancomycin (VANCOCIN) powder 2,000 mg  Status:  Discontinued        2,000 mg Other To Surgery 11/10/11 0719 11/10/11 0956   11/10/11 0730   tobramycin  (NEBCIN) powder 2.4 g  Status:  Discontinued        2.4 g Topical To Surgery 11/10/11 0720 11/10/11 0956   11/09/11 1300   ertapenem (INVANZ) 1 g in sodium chloride 0.9 % 50 mL IVPB        1 g 100 mL/hr over 30 Minutes Intravenous Every 24 hours 11/09/11 1125     11/08/11 1815   vancomycin (VANCOCIN) 1,250 mg in sodium chloride 0.9 % 250 mL IVPB        1,250 mg 166.7 mL/hr over 90 Minutes Intravenous Every 24 hours 11/08/11 1713            Assessment: Admit Complaint: swollen painful rt. knee, fatigue, "feeling ill". 78 y.o. F s/p right knee done 03/2011. Presented at outpatient office with large knee effusion, RLE edema; aspirated ~60cc grossly infected pus, sent to lab for culture.   ID-Vanc #4, rt. Knee infection, Knee fluid cx at MD office. Tmax 99.1. WBC 7.3. Scr 0.79 (CrCl 49). Urine culture growing EColi pan sensitive. Pincus Sanes should cover) Plan: Vancomycin 1250mg  /24h.  Invanz 1g/24h.   Goal of Therapy:  Vancomycin trough level 10-15 mcg/ml  Plan:  Vanco 1250mg  IV q24h. Check trough Mond/Tues if drug continued.  Merilynn Finland, [employer] 11/11/2011,11:28 AM

## 2011-11-11 NOTE — Progress Notes (Signed)
Subjective: 1 Day Post-Op Procedure(s) (LRB): IRRIGATION AND DEBRIDEMENT KNEE WITH POLY EXCHANGE (Right) Patient reports pain as moderate and severe.    Objective: Vital signs in last 24 hours: Temp:  [97.7 F (36.5 C)-99.1 F (37.3 C)] 99.1 F (37.3 C) (03/10 0613) Pulse Rate:  [56-73] 65  (03/10 0613) Resp:  [10-20] 18  (03/10 0613) BP: (112-134)/(42-117) 115/51 mmHg (03/10 0613) SpO2:  [98 %-100 %] 100 % (03/10 0613)  Intake/Output from previous day: 03/09 0701 - 03/10 0700 In: 1000 [I.V.:1000] Out: 75 [Blood:75] Intake/Output this shift:     Basename 11/11/11 0700 11/10/11 0630 11/09/11 0535 11/08/11 1641  HGB 10.7* 10.5* 10.3* 11.3*    Basename 11/11/11 0700 11/10/11 0630  WBC 7.3 6.6  RBC 3.54* 3.47*  HCT 33.5* 32.5*  PLT 416* 376    Basename 11/11/11 0700 11/10/11 0630  NA 137 139  K 3.7 3.2*  CL 98 98  CO2 32 33*  BUN 9 10  CREATININE 0.79 0.77  GLUCOSE 115* 100*  CALCIUM 8.6 8.1*    Basename 11/08/11 1641  LABPT --  INR 1.24    Neurovascular intact Dorsiflexion/Plantar flexion intact Incision: dressing C/D/I  Assessment/Plan: 1 Day Post-Op Procedure(s) (LRB): IRRIGATION AND DEBRIDEMENT KNEE WITH POLY EXCHANGE (Right) Up with therapy Continue ABX therapy due to Culture taken of surgical site during joint revision Discussed pain management with Dr. Madelon Lips, pt does have a tolerance to narcotics due to chronic high dose use at home.  We will convert to 30mg  bid ER oxycontin with added 5-10mg  oxycodone IR for breakthrough.    Margart Sickles 11/11/2011, 10:08 AM

## 2011-11-12 ENCOUNTER — Encounter (HOSPITAL_COMMUNITY): Payer: Self-pay | Admitting: Orthopedic Surgery

## 2011-11-12 LAB — CBC
MCH: 30.5 pg (ref 26.0–34.0)
MCHC: 32.4 g/dL (ref 30.0–36.0)
RDW: 13.7 % (ref 11.5–15.5)

## 2011-11-12 LAB — BASIC METABOLIC PANEL
BUN: 8 mg/dL (ref 6–23)
Calcium: 8.4 mg/dL (ref 8.4–10.5)
Creatinine, Ser: 0.78 mg/dL (ref 0.50–1.10)
GFR calc Af Amer: 87 mL/min — ABNORMAL LOW (ref >90)
GFR calc non Af Amer: 75 mL/min — ABNORMAL LOW (ref >90)
Glucose, Bld: 92 mg/dL (ref 70–99)

## 2011-11-12 LAB — VANCOMYCIN, TROUGH: Vancomycin Tr: 11.4 ug/mL (ref 10.0–20.0)

## 2011-11-12 NOTE — Clinical Documentation Improvement (Signed)
Abnormal Labs Clarification  THIS DOCUMENT IS NOT A PERMANENT PART OF THE MEDICAL RECORD  TO RESPOND TO THE THIS QUERY, FOLLOW THE INSTRUCTIONS BELOW:  1. If needed, update documentation for the patient's encounter via the notes activity.  2. Access this query again and click edit on the [employer].  3. After updating, or not, click F2 to complete all highlighted (required) fields concerning your review. Select "additional documentation in the medical record" OR "no additional documentation provided".  4. Click Sign note button.  5. The deficiency will fall out of your InBasket *Please let us know if you are not able to complete this workflow by phone or e-mail (listed below).  Please update your documentation within the medical record to reflect your response to this query.                                                                                   11/12/11 Dear Margart Sickles PA-C/Associates  In a better effort to capture your patient's severity of illness, reflect appropriate length of stay and utilization of resources, a review of the medical record has revealed the following indicators.    Based on your clinical judgment, please clarify and document in a progress note and/or discharge summary the clinical condition associated with the following supporting information:  In responding to this query please exercise your independent judgment.  The fact that a query is asked, does not imply that any particular answer is desired or expected.  Abnormal findings (laboratory, x-ray, pathologic, and other diagnostic results) are not coded and reported unless the physician indicates their clinical significance.   The medical record reflects the following clinical findings, please clarify the diagnostic and/or clinical significance:      Noted pre op  urine culture grew > 100,000 colonies of Ecoli.  Please clarify the secondary diagnosis if appropriate and   if condition treated  during this hospital stay.  Thank you                                       Supporting Information:  urine culture grew > 100,000 colonies of Ecoli.  Risk Factors: s/p I + D of rt knee, age                                Treatment:  Patient currently on IV  Vancomycin and Invanz for treatment of knee infection    Possible Conditions?  Ecoli Uti  Bacturia  Other Condition               Cannot Clinically Determine   Reviewed: additional documentation in the medical record   Thank You,  Leonette Most Addison  Clinical Documentation Specialist RN, BSN:  Pager [phone] HIM off [phone]  Health Information Management [place]

## 2011-11-12 NOTE — Progress Notes (Signed)
ANTIBIOTIC CONSULT NOTE - FOLLOW UP  Pharmacy Consult for Vancomycin Indication: knee infection  Allergies  Allergen Reactions  . Ativan Anxiety  . Demerol Shortness Of Breath  . Penicillins Swelling    "in the face"  . Tetracyclines & Related Rash    Patient Measurements: Height: 5\' 4"  (162.6 cm) (from anesthesia records 09/13/11) Weight: 145 lb 4.8 oz (65.908 kg) IBW/kg (Calculated) : 54.7  Adjusted Body Weight:   Vital Signs: Temp: 97.9 F (36.6 C) (03/11 1334) Temp src: Oral (03/11 1334) BP: 105/42 mmHg (03/11 1334) Pulse Rate: 65  (03/11 1334) Intake/Output from previous day: 03/10 0701 - 03/11 0700 In: 360 [P.O.:360] Out: -  Intake/Output from this shift:    Labs:  Basename 11/12/11 0449 11/11/11 0700 11/10/11 0630  WBC 6.0 7.3 6.6  HGB 9.9* 10.7* 10.5*  PLT 396 416* 376  LABCREA -- -- --  CREATININE 0.78 0.79 0.77   Estimated Creatinine Clearance: 48.9 ml/min (by C-G formula based on Cr of 0.78).  Basename 11/12/11 1702  VANCOTROUGH 11.4  VANCOPEAK --  VANCORANDOM --  GENTTROUGH --  GENTPEAK --  GENTRANDOM --  TOBRATROUGH --  TOBRAPEAK --  TOBRARND --  AMIKACINPEAK --  AMIKACINTROU --  AMIKACIN --     Microbiology: Recent Results (from the past 720 hour(s))  CULTURE, BLOOD (ROUTINE X 2)     Status: Normal (Preliminary result)   Collection Time   11/08/11  4:30 PM      Component Value Range Status Comment   Specimen Description BLOOD HAND RIGHT   Final    Special Requests BOTTLES DRAWN AEROBIC ONLY 5CC   Final    Culture  Setup Time [id number]   Final    Culture     Final    Value:        BLOOD CULTURE RECEIVED NO GROWTH TO DATE CULTURE WILL BE HELD FOR 5 DAYS BEFORE ISSUING A FINAL NEGATIVE REPORT   Report Status PENDING   Incomplete   CULTURE, BLOOD (ROUTINE X 2)     Status: Normal (Preliminary result)   Collection Time   11/08/11  4:45 PM      Component Value Range Status Comment   Specimen Description BLOOD ARM LEFT   Final    Special  Requests     Final    Value: BOTTLES DRAWN AEROBIC AND ANAEROBIC 8CC AER 6CC ANA   Culture  Setup Time [id number]   Final    Culture     Final    Value:        BLOOD CULTURE RECEIVED NO GROWTH TO DATE CULTURE WILL BE HELD FOR 5 DAYS BEFORE ISSUING A FINAL NEGATIVE REPORT   Report Status PENDING   Incomplete   URINE CULTURE     Status: Normal   Collection Time   11/08/11 10:21 PM      Component Value Range Status Comment   Specimen Description URINE, CLEAN CATCH   Final    Special Requests NONE   Final    Culture  Setup Time [id number]   Final    Colony Count >=100,000 COLONIES/ML   Final    Culture ESCHERICHIA COLI   Final    Report Status 11/10/2011 FINAL   Final    Organism ID, Bacteria ESCHERICHIA COLI   Final   ANAEROBIC CULTURE     Status: Normal (Preliminary result)   Collection Time   11/10/11  9:15 AM      Component Value Range Status Comment  Specimen Description ABSCESS RIGHT KNEE   Final    Special Requests NONE   Final    Gram Stain     Final    Value: ABUNDANT WBC PRESENT,BOTH PMN AND MONONUCLEAR     NO SQUAMOUS EPITHELIAL CELLS SEEN     NO ORGANISMS SEEN   Culture     Final    Value: NO ANAEROBES ISOLATED; CULTURE IN PROGRESS FOR 5 DAYS   Report Status PENDING   Incomplete   AFB CULTURE WITH SMEAR     Status: Normal (Preliminary result)   Collection Time   11/10/11  9:15 AM      Component Value Range Status Comment   Specimen Description ABSCESS RIGHT KNEE   Final    Special Requests NONE   Final    ACID FAST SMEAR NO ACID FAST BACILLI SEEN   Final    Culture     Final    Value: CULTURE WILL BE EXAMINED FOR 6 WEEKS BEFORE ISSUING A FINAL REPORT   Report Status PENDING   Incomplete   CULTURE, ROUTINE-ABSCESS     Status: Normal (Preliminary result)   Collection Time   11/10/11  9:15 AM      Component Value Range Status Comment   Specimen Description ABSCESS RIGHT KNEE   Final    Special Requests NONE   Final    Gram Stain     Final    Value: ABUNDANT WBC  PRESENT,BOTH PMN AND MONONUCLEAR     NO SQUAMOUS EPITHELIAL CELLS SEEN     NO ORGANISMS SEEN   Culture     Final    Value: RARE GROUP B STREP(S.AGALACTIAE)ISOLATED     Note: TESTING AGAINST S. AGALACTIAE NOT ROUTINELY PERFORMED DUE TO PREDICTABILITY OF AMP/PEN/VAN SUSCEPTIBILITY.   Report Status PENDING   Incomplete     Anti-infectives     Start     Dose/Rate Route Frequency Ordered Stop   11/10/11 0849   vancomycin (VANCOCIN) powder  Status:  Discontinued          As needed 11/10/11 0849 11/10/11 0943   11/10/11 0848   tobramycin (NEBCIN) powder  Status:  Discontinued          As needed 11/10/11 0849 11/10/11 0943   11/10/11 0730   vancomycin (VANCOCIN) powder 2,000 mg  Status:  Discontinued        2,000 mg Other To Surgery 11/10/11 0719 11/10/11 0956   11/10/11 0730   tobramycin (NEBCIN) powder 2.4 g  Status:  Discontinued        2.4 g Topical To Surgery 11/10/11 0720 11/10/11 0956   11/09/11 1300   ertapenem (INVANZ) 1 g in sodium chloride 0.9 % 50 mL IVPB  Status:  Discontinued        1 g 100 mL/hr over 30 Minutes Intravenous Every 24 hours 11/09/11 1125 11/12/11 1450   11/08/11 1815   vancomycin (VANCOCIN) 1,250 mg in sodium chloride 0.9 % 250 mL IVPB        1,250 mg 166.7 mL/hr over 90 Minutes Intravenous Every 24 hours 11/08/11 1713            Assessment: 86yof on Vancomycin Day 5 for knee infection. Vancomycin trough was therapeutic at 11.4 this evening. Patient is afebrile, WBC wnl, see cultures above. Renal function has remained stable (SCr ~ 0.8). ID has been consulted - recommended to continue Vancomycin for 6 weeks (due to PCN allergy).   Goal of Therapy:  Vancomycin trough 10-20  Plan:  1. Continue Vancomycin 1.25g IV q24h 2. Continue to monitor renal function, vitals, cultures and adjust as indicated  Cleon Dew [phone] 11/12/2011,7:31 PM

## 2011-11-12 NOTE — Progress Notes (Signed)
INFECTIOUS DISEASE PROGRESS NOTE  ID: [name] is a 78 y.o. female with bilateral knee replacement, right knee done 03/2011. Over the past few weeks she had right leg swelling and tenderness, fevers, malaise. Labs in [place] of 2012 were negative for infection. Yesterday she presented in the clinic with large R knee effusion and right leg edema. 60 cc were asprated and sent fror Gram stain and culture. Gram stain was negative for organisms,no crystal, 76,000 WBCs. Now with GBS in wound.     Subjective: Feels much better with her pain than prior to surgery.   Abtx:  Anti-infectives     Start     Dose/Rate Route Frequency Ordered Stop   11/10/11 0849   vancomycin (VANCOCIN) powder  Status:  Discontinued          As needed 11/10/11 0849 11/10/11 0943   11/10/11 0848   tobramycin (NEBCIN) powder  Status:  Discontinued          As needed 11/10/11 0849 11/10/11 0943   11/10/11 0730   vancomycin (VANCOCIN) powder 2,000 mg  Status:  Discontinued        2,000 mg Other To Surgery 11/10/11 0719 11/10/11 0956   11/10/11 0730   tobramycin (NEBCIN) powder 2.4 g  Status:  Discontinued        2.4 g Topical To Surgery 11/10/11 0720 11/10/11 0956   11/09/11 1300   ertapenem (INVANZ) 1 g in sodium chloride 0.9 % 50 mL IVPB        1 g 100 mL/hr over 30 Minutes Intravenous Every 24 hours 11/09/11 1125     11/08/11 1815   vancomycin (VANCOCIN) 1,250 mg in sodium chloride 0.9 % 250 mL IVPB        1,250 mg 166.7 mL/hr over 90 Minutes Intravenous Every 24 hours 11/08/11 1713            Medications: I have reviewed the patient's current medications.  Objective: Vital signs in last 24 hours: Temp:  [97.9 F (36.6 C)-98.7 F (37.1 C)] 97.9 F (36.6 C) (03/11 1334) Pulse Rate:  [64-72] 65  (03/11 1334) Resp:  [18] 18  (03/11 1334) BP: (105-140)/(42-63) 105/42 mmHg (03/11 1334) SpO2:  [99 %-100 %] 100 % (03/11 1334)   General appearance: alert, cooperative and no distress Extremities: +  wrapped, drain  Lab Results  Basename 11/12/11 0449 11/11/11 0700  WBC 6.0 7.3  HGB 9.9* 10.7*  HCT 30.6* 33.5*  NA 137 137  K 3.6 3.7  CL 99 98  CO2 30 32  BUN 8 9  CREATININE 0.78 0.79  GLU -- --   Liver Panel No results found for this basename: PROT:2,ALBUMIN:2,AST:2,ALT:2,ALKPHOS:2,BILITOT:2,BILIDIR:2,IBILI:2 in the last 72 hours Sedimentation Rate No results found for this basename: ESRSEDRATE in the last 72 hours C-Reactive Protein No results found for this basename: CRP:2 in the last 72 hours  Microbiology: Recent Results (from the past 240 hour(s))  CULTURE, BLOOD (ROUTINE X 2)     Status: Normal (Preliminary result)   Collection Time   11/08/11  4:30 PM      Component Value Range Status Comment   Specimen Description BLOOD HAND RIGHT   Final    Special Requests BOTTLES DRAWN AEROBIC ONLY 5CC   Final    Culture  Setup Time [id number]   Final    Culture     Final    Value:        BLOOD CULTURE RECEIVED NO GROWTH TO DATE CULTURE WILL  BE HELD FOR 5 DAYS BEFORE ISSUING A FINAL NEGATIVE REPORT   Report Status PENDING   Incomplete   CULTURE, BLOOD (ROUTINE X 2)     Status: Normal (Preliminary result)   Collection Time   11/08/11  4:45 PM      Component Value Range Status Comment   Specimen Description BLOOD ARM LEFT   Final    Special Requests     Final    Value: BOTTLES DRAWN AEROBIC AND ANAEROBIC 8CC AER 6CC ANA   Culture  Setup Time [id number]   Final    Culture     Final    Value:        BLOOD CULTURE RECEIVED NO GROWTH TO DATE CULTURE WILL BE HELD FOR 5 DAYS BEFORE ISSUING A FINAL NEGATIVE REPORT   Report Status PENDING   Incomplete   URINE CULTURE     Status: Normal   Collection Time   11/08/11 10:21 PM      Component Value Range Status Comment   Specimen Description URINE, CLEAN CATCH   Final    Special Requests NONE   Final    Culture  Setup Time [id number]   Final    Colony Count >=100,000 COLONIES/ML   Final    Culture ESCHERICHIA COLI   Final     Report Status 11/10/2011 FINAL   Final    Organism ID, Bacteria ESCHERICHIA COLI   Final   ANAEROBIC CULTURE     Status: Normal (Preliminary result)   Collection Time   11/10/11  9:15 AM      Component Value Range Status Comment   Specimen Description ABSCESS RIGHT KNEE   Final    Special Requests NONE   Final    Gram Stain     Final    Value: ABUNDANT WBC PRESENT,BOTH PMN AND MONONUCLEAR     NO SQUAMOUS EPITHELIAL CELLS SEEN     NO ORGANISMS SEEN   Culture     Final    Value: NO ANAEROBES ISOLATED; CULTURE IN PROGRESS FOR 5 DAYS   Report Status PENDING   Incomplete   AFB CULTURE WITH SMEAR     Status: Normal (Preliminary result)   Collection Time   11/10/11  9:15 AM      Component Value Range Status Comment   Specimen Description ABSCESS RIGHT KNEE   Final    Special Requests NONE   Final    ACID FAST SMEAR NO ACID FAST BACILLI SEEN   Final    Culture     Final    Value: CULTURE WILL BE EXAMINED FOR 6 WEEKS BEFORE ISSUING A FINAL REPORT   Report Status PENDING   Incomplete   CULTURE, ROUTINE-ABSCESS     Status: Normal (Preliminary result)   Collection Time   11/10/11  9:15 AM      Component Value Range Status Comment   Specimen Description ABSCESS RIGHT KNEE   Final    Special Requests NONE   Final    Gram Stain     Final    Value: ABUNDANT WBC PRESENT,BOTH PMN AND MONONUCLEAR     NO SQUAMOUS EPITHELIAL CELLS SEEN     NO ORGANISMS SEEN   Culture     Final    Value: RARE GROUP B STREP(S.AGALACTIAE)ISOLATED     Note: TESTING AGAINST S. AGALACTIAE NOT ROUTINELY PERFORMED DUE TO PREDICTABILITY OF AMP/PEN/VAN SUSCEPTIBILITY.   Report Status PENDING   Incomplete     Studies/Results: No results found.  Assessment/Plan: 1) PJI with polyexchage - Will continue with vancomycin.  Since she has a severe penicillin allergy, will avoid ceftriaxone. She should continue for 6 weeks and follow up with the ID clinic at that time.  At that time, continuation po therapy can be considered and  may need to be lifelong.      She should have a weekly CBC, CMP, ESR, CRP and vanco trough done and follow up with ID in 6 weeks.  Thanks and call with any questions.   COMER, ROBERT Infectious Diseases 11/12/2011, 2:39 PM

## 2011-11-12 NOTE — Progress Notes (Signed)
Subjective: 2 Days Post-Op Procedure(s) (LRB): IRRIGATION AND DEBRIDEMENT KNEE WITH POLY EXCHANGE (Right) Patient reports pain as mild.  Pain under better control.    Objective: Vital signs in last 24 hours: Temp:  [98.1 F (36.7 C)-98.7 F (37.1 C)] 98.1 F (36.7 C) (03/11 0651) Pulse Rate:  [64-72] 72  (03/11 1032) Resp:  [18] 18  (03/11 0651) BP: (124-140)/(52-63) 140/52 mmHg (03/11 1032) SpO2:  [99 %] 99 % (03/11 0651)  Intake/Output from previous day: 03/10 0701 - 03/11 0700 In: 360 [P.O.:360] Out: -  Intake/Output this shift: Total I/O In: -  Out: 150 [Drains:150]   Basename 11/12/11 0449 11/11/11 0700 11/10/11 0630  HGB 9.9* 10.7* 10.5*    Basename 11/12/11 0449 11/11/11 0700  WBC 6.0 7.3  RBC 3.25* 3.54*  HCT 30.6* 33.5*  PLT 396 416*    Basename 11/12/11 0449 11/11/11 0700  NA 137 137  K 3.6 3.7  CL 99 98  CO2 30 32  BUN 8 9  CREATININE 0.78 0.79  GLUCOSE 92 115*  CALCIUM 8.4 8.6   No results found for this basename: LABPT:2,INR:2 in the last 72 hours  Neurovascular intact Sensation intact distally Intact pulses distally Dorsiflexion/Plantar flexion intact Incision: dressing C/D/I  Assessment/Plan: 2 Days Post-Op Procedure(s) (LRB): IRRIGATION AND DEBRIDEMENT KNEE WITH POLY EXCHANGE (Right) Up with therapy Continue ABX therapy due to Post-op infection Planning for d/c most likely home with home health. Appreciate home antibiotic rec's for her RARE GROUP B STREP(S.AGALACTIAE)ISOLATED Monitor q8hr output from HV drain, likely d/c drain tomorrow if low output    [name] 11/12/2011, 1:30 PM

## 2011-11-12 NOTE — Progress Notes (Signed)
Occupational Therapy Evaluation Patient Details Name: [name] MRN: [id number] DOB: 1925-06-23 Today's Date: 11/12/2011  Problem List:  Patient Active Problem List  Diagnoses  . Hypertension    Past Medical History:  Past Medical History  Diagnosis Date  . Angina   . Headache   . Hypoglycemia   . Fracture 2008    back  . Macular degeneration of both eyes   . Glaucoma   . Pneumonia   . Shortness of breath 11/08/11    "here lately, all the time"  . Hypothyroidism   . Blood transfusion   . Anemia   . GERD (gastroesophageal reflux disease)   . H/O hiatal hernia   . Arthritis   . Chronic back pain   . Thyroid cancer 1986   Past Surgical History:  Past Surgical History  Procedure Date  . Thyroidectomy   . Shoulder arthroscopy w/ rotator cuff repair   . Trigger finger release     right  thumb  . Abdominal hysterectomy   . Carpal tunnel release     bilaterally  . Tonsillectomy and adenoidectomy   . Appendectomy   . Dilation and curettage of uterus   . Cataract extraction w/ intraocular lens  implant, bilateral   . Joint replacement 2005, 2006, 2012, 11/10/11    left; left; right; right; right  . Goiter removed     "when I was young"  . Fracture surgery     "broke back"  . Back surgery     "3 back OR's"  . Hand or 09/2011    "left hand; bones were coming together"    OT Assessment/Plan/Recommendation OT Assessment Clinical Impression Statement: 78 yo s/p I & D of R knee with poly exchange. All education regarding ADL retraining with available AE was completed. Pt has all nec DME and states that her husband will do everything for her that she is unable to do. No further services required. Pt plans to D/C home with husband and will continue with HHPT. OT signing off. thanks OT Recommendation/Assessment: Patient does not need any further OT services OT Recommendation Follow Up Recommendations: No OT follow up Equipment Recommended: None recommended by OT OT  Goals Acute Rehab OT Goals OT Goal Formulation:  (eval only)  OT Evaluation Precautions/Restrictions  Precautions Precautions: Knee Restrictions Weight Bearing Restrictions: No LLE Weight Bearing: Weight bearing as tolerated Prior Functioning Home Living Lives With: Spouse Receives Help From: Family Type of Home: House Home Layout: One level Home Access: Stairs to enter Entrance Stairs-Rails: Right Entrance Stairs-Number of Steps: 2 Bathroom Shower/Tub: Heritage manager Accessibility: Yes How Accessible: Accessible via walker Home Adaptive Equipment: Bedside commode/3-in-1;Built-in shower seat;Walker - rolling Additional Comments: Pt states husband will do everything for her that she is unable to do for herself. Prior Function Level of Independence: Independent with homemaking with ambulation;Independent with homemaking with wheelchair;Independent with gait Able to Take Stairs?: Yes ADL ADL Eating/Feeding: Simulated;Independent Where Assessed - Eating/Feeding: Chair Grooming: Simulated;Modified independent Where Assessed - Grooming: Sitting, chair Upper Body Bathing: Simulated;Set up Where Assessed - Upper Body Bathing: Sitting, chair Lower Body Bathing: Simulated;Minimal assistance Where Assessed - Lower Body Bathing: Sit to stand from chair Upper Body Dressing: Simulated;Set up Where Assessed - Upper Body Dressing: Sitting, chair Lower Body Dressing: Simulated;Minimal assistance Where Assessed - Lower Body Dressing: Sit to stand from chair;Unsupported Toilet Transfer: Simulated;Supervision/safety Toilet Transfer Method: Proofreader: Bedside commode Toileting - Clothing Manipulation: Independent Where Assessed - Toileting Clothing Manipulation: Standing  Toileting - Hygiene: Independent Where Assessed - Toileting Hygiene: Standing Tub/Shower Transfer: Simulated;Minimal assistance (steady A) Tub/Shower Transfer Method:  Science writer: Walk in shower Equipment Used: Sock aid;Long-handled shoe horn;Long-handled sponge;Reacher Ambulation Related to ADLs: supervision ADL Comments: A for LB ADL only. Husband will assist. Vision/Perception  Vision - History Baseline Vision: Wears glasses all the time Visual History: Glaucoma;Macular degeneration Patient Visual Report: No change from baseline Perception Perception: Within Functional Limits Praxis Praxis: Intact Cognition Cognition Arousal/Alertness: Awake/alert Overall Cognitive Status: Appears within functional limits for tasks assessed Orientation Level: Oriented X4 Sensation/Coordination Coordination Gross Motor Movements are Fluid and Coordinated: Yes Fine Motor Movements are Fluid and Coordinated: Yes Extremity Assessment RUE Assessment RUE Assessment: Within Functional Limits LUE Assessment LUE Assessment: Within Functional Limits Mobility  Bed Mobility Bed Mobility: No Transfers Transfers: Yes Sit to Stand: From chair/3-in-1;5: Supervision Sit to Stand Details (indicate cue type and reason): cues for hand placement Stand to Sit: 5: Supervision;To chair/3-in-1;With upper extremity assist Exercises   End of Session OT - End of Session Equipment Utilized During Treatment: Gait belt Activity Tolerance: Patient tolerated treatment well Patient left: in chair;with call bell in reach Nurse Communication: Mobility status for transfers General Behavior During Session: [facility] for tasks performed Cognition: [facility] for tasks performed   [facility] 11/12/2011, 10:11 AM  [facility], OTR/L  [phone] 11/12/2011

## 2011-11-12 NOTE — Progress Notes (Signed)
Physical Therapy Treatment Patient Details Name: [name] MRN: [id number] DOB: 02-09-1925 Today's Date: 11/12/2011  PT Assessment/Plan  PT - Assessment/Plan Comments on Treatment Session: Making good progress PT Plan: Discharge plan remains appropriate PT Frequency: 7X/week Follow Up Recommendations: Home health PT Equipment Recommended: None recommended by PT PT Goals  Acute Rehab PT Goals Time For Goal Achievement: 7 days Pt will go Sit to Stand: with modified independence PT Goal: Sit to Stand - Progress: Progressing toward goal Pt will go Stand to Sit: with modified independence PT Goal: Stand to Sit - Progress: Progressing toward goal Pt will Ambulate: >150 feet;with modified independence;with rolling walker PT Goal: Ambulate - Progress: Progressing toward goal  PT Treatment Precautions/Restrictions  Precautions Precautions: Knee Restrictions Weight Bearing Restrictions: No LLE Weight Bearing: Weight bearing as tolerated Mobility (including Balance) Bed Mobility Bed Mobility: No Transfers Sit to Stand: 4: Min assist;With upper extremity assist;From chair/3-in-1 (Guard assist without physical contact) Sit to Stand Details (indicate cue type and reason): cues fro safety; progressing well (will likely be supervision level soon) Stand to Sit: 5: Supervision;To chair/3-in-1;With upper extremity assist Stand to Sit Details: Excellent control of descent Ambulation/Gait Ambulation/Gait: Yes Ambulation/Gait Assistance: 4: Min assist (Guard assist with and without physical contact) Ambulation/Gait Assistance Details (indicate cue type and reason): cues for sequence and posture; consistent progress (will likely be supervision level next session) Ambulation Distance (Feet): 6 Feet Assistive device: Rolling walker Gait Pattern: Step-to pattern    Exercise Pt politely declined therex   End of Session PT - End of Session Equipment Utilized During Treatment: Gait  belt Activity Tolerance: Patient tolerated treatment well (pt reports very tired, but still participating well) Patient left: in chair;with call bell in reach Nurse Communication: Mobility status for transfers;Mobility status for ambulation General Behavior During Session: [facility] for tasks performed Cognition: [facility] for tasks performed  [place] [facility] [place], [place] [phone]  11/12/2011, 11:44 AM

## 2011-11-13 LAB — CULTURE, ROUTINE-ABSCESS

## 2011-11-13 MED ORDER — VANCOMYCIN HCL 1000 MG IV SOLR
1500.0000 mg | INTRAVENOUS | Status: DC
  Administered 2011-11-13 – 2011-11-14 (×2): 1500 mg via INTRAVENOUS
  Filled 2011-11-13 (×2): qty 1500

## 2011-11-13 NOTE — Progress Notes (Signed)
Physical Therapy Treatment Patient Details Name: [name] MRN: [id number] DOB: 10-16-1924 Today's Date: 11/13/2011  PT Assessment/Plan  PT - Assessment/Plan Comments on Treatment Session: pt. is making good progress towards goals. minimally limited by pain. practiced stair training today (2 steps x 2). ambulated to and from ortho gym. req rest break before starting stairs and before ambulation back to her room.  PT Plan: Discharge plan remains appropriate PT Frequency: 7X/week Follow Up Recommendations: Home health PT Equipment Recommended: None recommended by PT PT Goals  Acute Rehab PT Goals PT Goal Formulation: With patient Time For Goal Achievement: 7 days PT Goal: Sit to Stand - Progress: Progressing toward goal PT Goal: Stand to Sit - Progress: Progressing toward goal PT Goal: Ambulate - Progress: Progressing toward goal PT Goal: Up/Down Stairs - Progress: Not met  PT Treatment Precautions/Restrictions  Precautions Precautions: Knee Restrictions Weight Bearing Restrictions: Yes LLE Weight Bearing: Weight bearing as tolerated Mobility (including Balance) Transfers Transfers: Yes Sit to Stand: With upper extremity assist;With armrests;From chair/3-in-1 (Min Gaurd A) Sit to Stand Details (indicate cue type and reason): Progressing well, demonstrated correct hand placement. reported "a little" dizziness upon standing but resolved quickly Stand to Sit: 5: Supervision;To chair/3-in-1;With upper extremity assist Stand to Sit Details: performed sit><stand x 3. demonstrated good controlled descent and correct hand placement. Ambulation/Gait Ambulation/Gait: Yes Ambulation/Gait Assistance:  (min Guard A) Ambulation/Gait Assistance Details (indicate cue type and reason): amb ~100 feet. req vc cues for safety(stand up tall, look up). cues to stay inside RW.  Ambulation Distance (Feet): 100 Feet Assistive device: Rolling walker Gait Pattern: Step-to pattern;Decreased step  length - left;Decreased stance time - right Stairs: Yes Stairs Assistance: 4: Min assist Stairs Assistance Details (indicate cue type and reason): rail on right & HHA on Left.  Able to recall sequencing & technique from last knee sx (2012).   demonstrated good sequencing Stair Management Technique: One rail Left;Step to pattern;Forwards (HHA) Number of Stairs: 2 (2x's) Height of Stairs: 6     Exercise  Total Joint Exercises Ankle Circles/Pumps: AROM;Both;15 reps;Seated Heel Slides: AROM;Right;10 reps;Seated End of Session PT - End of Session Equipment Utilized During Treatment: Gait belt Activity Tolerance: Patient tolerated treatment well;Patient limited by pain Patient left: in chair;with call bell in reach Nurse Communication: Other (comment) (pt. requested pain medication) General Behavior During Session: [facility] for tasks performed Cognition: [facility] for tasks performed  Ardyth Gal, SPTA 11/13/2011, 10:39 AM    Verdell Face, PTA [phone] 11/13/2011

## 2011-11-13 NOTE — Progress Notes (Signed)
Subjective: 3 Days Post-Op Procedure(s) (LRB): IRRIGATION AND DEBRIDEMENT KNEE WITH POLY EXCHANGE (Right) Patient reports pain as mild.    Objective: Vital signs in last 24 hours: Temp:  [97.5 F (36.4 C)-98.6 F (37 C)] 97.5 F (36.4 C) (03/12 0459) Pulse Rate:  [65-68] 68  (03/12 1027) Resp:  [16-18] 18  (03/12 0459) BP: (104-118)/(42-60) 114/60 mmHg (03/12 1027) SpO2:  [96 %-100 %] 96 % (03/12 0459)  Intake/Output from previous day: 03/11 0701 - 03/12 0700 In: 480 [P.O.:480] Out: 150 [Drains:150] Intake/Output this shift: Total I/O In: 240 [P.O.:240] Out: 300 [Urine:300]   Basename 11/12/11 0449 11/11/11 0700  HGB 9.9* 10.7*    Basename 11/12/11 0449 11/11/11 0700  WBC 6.0 7.3  RBC 3.25* 3.54*  HCT 30.6* 33.5*  PLT 396 416*    Basename 11/12/11 0449 11/11/11 0700  NA 137 137  K 3.6 3.7  CL 99 98  CO2 30 32  BUN 8 9  CREATININE 0.78 0.79  GLUCOSE 92 115*  CALCIUM 8.4 8.6   No results found for this basename: LABPT:2,INR:2 in the last 72 hours  Neurovascular intact Sensation intact distally Intact pulses distally Dorsiflexion/Plantar flexion intact Incision: dressing C/D/I and no drainage No cellulitis present  Assessment/Plan: 3 Days Post-Op Procedure(s) (LRB): IRRIGATION AND DEBRIDEMENT KNEE WITH POLY EXCHANGE (Right) Up with therapy Continue ABX therapy due to Post-op infection Discharge home with home health  [facility] PICC line placement for home IV abx per ID rec's x6wks Weekly lab draw with results to ID office (CBC, CMP, CRP, ESR, vanc trough)  Tomie Spizzirri 11/13/2011, 12:58 PM

## 2011-11-13 NOTE — Progress Notes (Signed)
ANTIBIOTIC CONSULT NOTE - FOLLOW UP  Pharmacy Consult for Vancomycin Indication: knee infection  Allergies  Allergen Reactions  . Ativan Anxiety  . Demerol Shortness Of Breath  . Penicillins Swelling    "in the face"  . Tetracyclines & Related Rash    Patient Measurements: Height: 5\' 4"  (162.6 cm) (from anesthesia records 09/13/11) Weight: 145 lb 4.8 oz (65.908 kg) IBW/kg (Calculated) : 54.7  Adjusted Body Weight:   Vital Signs: Temp: 97.5 F (36.4 C) (03/12 0459) Temp src: Oral (03/11 2112) BP: 118/48 mmHg (03/12 0459) Pulse Rate: 65  (03/12 0459) Intake/Output from previous day: 03/11 0701 - 03/12 0700 In: 480 [P.O.:480] Out: 150 [Drains:150] Intake/Output from this shift:    Labs:  Basename 11/12/11 0449 11/11/11 0700  WBC 6.0 7.3  HGB 9.9* 10.7*  PLT 396 416*  LABCREA -- --  CREATININE 0.78 0.79   Estimated Creatinine Clearance: 48.9 ml/min (by C-G formula based on Cr of 0.78).  Basename 11/12/11 1702  VANCOTROUGH 11.4  VANCOPEAK --  VANCORANDOM --  GENTTROUGH --  GENTPEAK --  GENTRANDOM --  TOBRATROUGH --  TOBRAPEAK --  TOBRARND --  AMIKACINPEAK --  AMIKACINTROU --  AMIKACIN --     Microbiology: Recent Results (from the past 720 hour(s))  CULTURE, BLOOD (ROUTINE X 2)     Status: Normal (Preliminary result)   Collection Time   11/08/11  4:30 PM      Component Value Range Status Comment   Specimen Description BLOOD HAND RIGHT   Final    Special Requests BOTTLES DRAWN AEROBIC ONLY 5CC   Final    Culture  Setup Time [id number]   Final    Culture     Final    Value:        BLOOD CULTURE RECEIVED NO GROWTH TO DATE CULTURE WILL BE HELD FOR 5 DAYS BEFORE ISSUING A FINAL NEGATIVE REPORT   Report Status PENDING   Incomplete   CULTURE, BLOOD (ROUTINE X 2)     Status: Normal (Preliminary result)   Collection Time   11/08/11  4:45 PM      Component Value Range Status Comment   Specimen Description BLOOD ARM LEFT   Final    Special Requests     Final      Value: BOTTLES DRAWN AEROBIC AND ANAEROBIC 8CC AER 6CC ANA   Culture  Setup Time [id number]   Final    Culture     Final    Value:        BLOOD CULTURE RECEIVED NO GROWTH TO DATE CULTURE WILL BE HELD FOR 5 DAYS BEFORE ISSUING A FINAL NEGATIVE REPORT   Report Status PENDING   Incomplete   URINE CULTURE     Status: Normal   Collection Time   11/08/11 10:21 PM      Component Value Range Status Comment   Specimen Description URINE, CLEAN CATCH   Final    Special Requests NONE   Final    Culture  Setup Time [id number]   Final    Colony Count >=100,000 COLONIES/ML   Final    Culture ESCHERICHIA COLI   Final    Report Status 11/10/2011 FINAL   Final    Organism ID, Bacteria ESCHERICHIA COLI   Final   ANAEROBIC CULTURE     Status: Normal (Preliminary result)   Collection Time   11/10/11  9:15 AM      Component Value Range Status Comment   Specimen Description ABSCESS RIGHT KNEE  Final    Special Requests NONE   Final    Gram Stain     Final    Value: ABUNDANT WBC PRESENT,BOTH PMN AND MONONUCLEAR     NO SQUAMOUS EPITHELIAL CELLS SEEN     NO ORGANISMS SEEN   Culture     Final    Value: NO ANAEROBES ISOLATED; CULTURE IN PROGRESS FOR 5 DAYS   Report Status PENDING   Incomplete   AFB CULTURE WITH SMEAR     Status: Normal (Preliminary result)   Collection Time   11/10/11  9:15 AM      Component Value Range Status Comment   Specimen Description ABSCESS RIGHT KNEE   Final    Special Requests NONE   Final    ACID FAST SMEAR NO ACID FAST BACILLI SEEN   Final    Culture     Final    Value: CULTURE WILL BE EXAMINED FOR 6 WEEKS BEFORE ISSUING A FINAL REPORT   Report Status PENDING   Incomplete   CULTURE, ROUTINE-ABSCESS     Status: Normal (Preliminary result)   Collection Time   11/10/11  9:15 AM      Component Value Range Status Comment   Specimen Description ABSCESS RIGHT KNEE   Final    Special Requests NONE   Final    Gram Stain     Final    Value: ABUNDANT WBC PRESENT,BOTH PMN AND  MONONUCLEAR     NO SQUAMOUS EPITHELIAL CELLS SEEN     NO ORGANISMS SEEN   Culture     Final    Value: RARE GROUP B STREP(S.AGALACTIAE)ISOLATED     Note: TESTING AGAINST S. AGALACTIAE NOT ROUTINELY PERFORMED DUE TO PREDICTABILITY OF AMP/PEN/VAN SUSCEPTIBILITY.   Report Status PENDING   Incomplete     Anti-infectives     Start     Dose/Rate Route Frequency Ordered Stop   11/10/11 0849   vancomycin (VANCOCIN) powder  Status:  Discontinued          As needed 11/10/11 0849 11/10/11 0943   11/10/11 0848   tobramycin (NEBCIN) powder  Status:  Discontinued          As needed 11/10/11 0849 11/10/11 0943   11/10/11 0730   vancomycin (VANCOCIN) powder 2,000 mg  Status:  Discontinued        2,000 mg Other To Surgery 11/10/11 0719 11/10/11 0956   11/10/11 0730   tobramycin (NEBCIN) powder 2.4 g  Status:  Discontinued        2.4 g Topical To Surgery 11/10/11 0720 11/10/11 0956   11/09/11 1300   ertapenem (INVANZ) 1 g in sodium chloride 0.9 % 50 mL IVPB  Status:  Discontinued        1 g 100 mL/hr over 30 Minutes Intravenous Every 24 hours 11/09/11 1125 11/12/11 1450   11/08/11 1815   vancomycin (VANCOCIN) 1,250 mg in sodium chloride 0.9 % 250 mL IVPB        1,250 mg 166.7 mL/hr over 90 Minutes Intravenous Every 24 hours 11/08/11 1713            Assessment: Pt on Vancomycin Day #6 for R knee abscess which is growing Group B strep (pt with PCN allergy). Afeb. WBC wnl. Renal function stable. Noted 3/11 Vanc trough 11.4 (will aim for goal trough 15-20 after discussing with ID pharmacist). Per ID plan for 6 weeks treatment IV abx.  Goal of Therapy:  Vancomycin trough 15-20  Plan:  1. Change Vancomycin  to 1.5g IV q24h 2. Will check trough at [facility] on new dose. Will continue to follow cultures and renal function  Christoper Fabian, PharmD, BCPS Clinical pharmacist, pager [phone] 11/13/2011,8:50 AM

## 2011-11-13 NOTE — Progress Notes (Signed)
CARE MANAGEMENT NOTE 11/13/2011  Action/Plan:   Spoke with patient, offered choice for HH. Patient has used [place] in the past. Entered request in [facility]. She will get PICC line for IV antibiotics. Has rolling walker and 3in1.   Anticipated DC Date:  11/14/2011   Anticipated DC Plan:  HOME W HOME HEALTH SERVICES      DC Planning Services  CM consult      [facility] Choice  HOME HEALTH   Choice offered to / List presented to:  C-1 Patient   DME arranged  NA      DME agency  NA     HH arranged  HH-1 RN  HH-2 PT      [facility] agency  [facility]   Status of service:  Completed, signed off Medicare Important Message given?   (If response is "NO", the following Medicare IM given date fields will be blank) Date Medicare IM given:   Date Additional Medicare IM given:    Discharge Disposition:  HOME W HOME HEALTH SERVICES

## 2011-11-14 MED ORDER — VANCOMYCIN HCL 1000 MG IV SOLR: 1500.0000 mg | INTRAVENOUS | Status: DC

## 2011-11-14 MED ORDER — OXYCODONE HCL 5 MG PO TABS: 5.0000 mg | ORAL_TABLET | ORAL | Status: AC | PRN

## 2011-11-14 MED ORDER — ENOXAPARIN SODIUM 30 MG/0.3ML ~~LOC~~ SOLN: 30.0000 mg | Freq: Two times a day (BID) | SUBCUTANEOUS | Status: DC

## 2011-11-14 MED ORDER — SODIUM CHLORIDE 0.9 % IJ SOLN: 10.0000 mL | INTRAMUSCULAR | Status: DC | PRN

## 2011-11-14 MED ORDER — OXYCODONE HCL 20 MG PO TB12
20.0000 mg | ORAL_TABLET | Freq: Two times a day (BID) | ORAL | Status: AC
Start: 2011-11-14 — End: 2011-11-28

## 2011-11-14 NOTE — Progress Notes (Signed)
Physical Therapy Treatment Patient Details Name: [name] MRN: [id number] DOB: [birth year] Today's Date: 11/14/2011  PT Assessment/Plan  PT - Assessment/Plan Comments on Treatment Session: Pt. continues to make progress. Plans to go home today.  Did not require frequent cues to look up today verses previous session during ambulation. Pt. declined practicing stairs today, stated she feels confident with them and husband will be there to assist her. Patient performed R knee flexion seated on EOB with pillow case under foot, only able to tolerate 5 reps due to pain. Amb ~ 100 feet, did not require rest break.  PT Frequency: 7X/week Follow Up Recommendations: Home health PT Equipment Recommended: None recommended by PT PT Goals  Acute Rehab PT Goals PT Goal Formulation: With patient Time For Goal Achievement: 7 days PT Goal: Supine/Side to Sit - Progress: Progressing toward goal PT Goal: Sit to Stand - Progress: Progressing toward goal PT Goal: Stand to Sit - Progress: Progressing toward goal PT Goal: Ambulate - Progress: Progressing toward goal PT Goal: Up/Down Stairs - Progress: Not met  PT Treatment Precautions/Restrictions  Precautions Precautions: Knee Restrictions Weight Bearing Restrictions: Yes RLE Weight Bearing: Weight bearing as tolerated LLE Weight Bearing: Weight bearing as tolerated Mobility (including Balance) Bed Mobility Bed Mobility: Yes Supine to Sit: HOB elevated (Comment degrees);5: Supervision Supine to Sit Details (indicate cue type and reason): HOB was elevated, Pt. scooted around to EOB using bed rail. cues to scoot hips forward and place feet flat on floor. Transfers Transfers: Yes Sit to Stand: With upper extremity assist;From bed (Min Guard A) Sit to Stand Details (indicate cue type and reason): sit to stand x 2. demonstrated safety awareness and proper hand placement.  Stand to Sit: 5: Supervision;With upper extremity assist;With armrests;To bed;To  chair/3-in-1 Stand to Sit Details: pt. demonstrated control with descent and proper managment of R LE when stand to sit Ambulation/Gait Ambulation/Gait: Yes Ambulation/Gait Assistance:  (Min Guard A) Ambulation/Gait Assistance Details (indicate cue type and reason): Only needed to be cued once for posture and to look up and mainitained during most of ambulation.  Ambulation Distance (Feet): 100 Feet Assistive device: Rolling walker Gait Pattern: Step-to pattern;Decreased step length - left;Decreased stance time - right Stairs: No    Exercise  Total Joint Exercises Knee Flexion: AROM;Strengthening;Right;Seated;5 reps End of Session PT - End of Session Equipment Utilized During Treatment: Gait belt Activity Tolerance: Patient tolerated treatment well;Patient limited by pain Patient left: in chair;with call bell in reach;with family/visitor present General Behavior During Session: [facility] for tasks performed Cognition: [facility] for tasks performed  Ardyth Gal SPTA 11/14/2011, 12:49 PM

## 2011-11-14 NOTE — Progress Notes (Signed)
[name], [place] [phone] 11/14/2011

## 2011-11-14 NOTE — Discharge Summary (Signed)
PATIENT ID:      [name]  MRN:     [id number] DOB/AGE:    1924/10/28 / 78 y.o.     DISCHARGE SUMMARY  ADMISSION DATE:    11/08/2011 DISCHARGE DATE:   11/14/2011   ADMISSION DIAGNOSIS: INFECTED RIGHT TOTAL KNEE   (INFECTED RIGHT TOTAL KNEE )  DISCHARGE DIAGNOSIS:  INFECTED RIGHT TOTAL KNEE     Secondary Diagnosis:  UTI- cx showing E. Coli   ADDITIONAL DIAGNOSIS: Active Problems:  * No active hospital problems. *   Past Medical History  Diagnosis Date  . Angina   . Headache   . Hypoglycemia   . Fracture 2008    back  . Macular degeneration of both eyes   . Glaucoma   . Pneumonia   . Shortness of breath 11/08/11    "here lately, all the time"  . Hypothyroidism   . Blood transfusion   . Anemia   . GERD (gastroesophageal reflux disease)   . H/O hiatal hernia   . Arthritis   . Chronic back pain   . Thyroid cancer 1986    PROCEDURE: Procedure(s): IRRIGATION AND DEBRIDEMENT KNEE WITH POLY EXCHANGE on 11/08/2011 - 11/10/2011  CONSULTS:   Infectious Disease for antibiotic recommendations  HISTORY:  See H&P in chart  HOSPITAL COURSE:  [name] is a 78 y.o. admitted on 11/08/2011 and found to have a diagnosis of INFECTED RIGHT TOTAL KNEE .  After appropriate laboratory studies were obtained  they were taken to the operating room on 11/08/2011 - 11/10/2011 and underwent Procedure(s): IRRIGATION AND DEBRIDEMENT KNEE WITH POLY EXCHANGE.   They were given perioperative antibiotics:  Anti-infectives     Start     Dose/Rate Route Frequency Ordered Stop   11/13/11 1800   vancomycin (VANCOCIN) 1,500 mg in sodium chloride 0.9 % 500 mL IVPB        1,500 mg 250 mL/hr over 120 Minutes Intravenous Every 24 hours 11/13/11 0853     11/10/11 0849   vancomycin (VANCOCIN) powder  Status:  Discontinued          As needed 11/10/11 0849 11/10/11 0943   11/10/11 0848   tobramycin (NEBCIN) powder  Status:  Discontinued          As needed 11/10/11 0849 11/10/11 0943   11/10/11 0730    vancomycin (VANCOCIN) powder 2,000 mg  Status:  Discontinued        2,000 mg Other To Surgery 11/10/11 0719 11/10/11 0956   11/10/11 0730   tobramycin (NEBCIN) powder 2.4 g  Status:  Discontinued        2.4 g Topical To Surgery 11/10/11 0720 11/10/11 0956   11/09/11 1300   ertapenem (INVANZ) 1 g in sodium chloride 0.9 % 50 mL IVPB  Status:  Discontinued        1 g 100 mL/hr over 30 Minutes Intravenous Every 24 hours 11/09/11 1125 11/12/11 1450   11/08/11 1815   vancomycin (VANCOCIN) 1,250 mg in sodium chloride 0.9 % 250 mL IVPB  Status:  Discontinued        1,250 mg 166.7 mL/hr over 90 Minutes Intravenous Every 24 hours 11/08/11 1713 11/13/11 0853        . Blood products given:none  Patient was placed on orthopaedic nursing unit.  She received IV vancomycin which pharmacy and ID adjusted dose for desired vanc trough.  She was also found to have UTI from urine culture.  Documented by pharmacy as follows regarding treatment:  Urine culture growing EColi pan sensitive. Pincus Sanes should cover) Plan: Vancomycin 1250mg  /24h.  Invanz 1g/24h. She was also found to have mild hyponatrimia which was mild and not treated.   A PICC line was placed for home antibiotics. The remainder of the hospital course was dedicated to ambulation and strengthening.   The patient was discharged on 4 Days Post-Op in  Stable condition.   DIAGNOSTIC STUDIES: Recent vital signs: Patient Vitals for the past 24 hrs:  BP Temp Pulse Resp SpO2  11/14/11 0516 130/65 mmHg 99.1 F (37.3 C) 75  18  94 %  11/13/11 2137 126/62 mmHg 99.6 F (37.6 C) 69  18  98 %  11/13/11 1450 109/42 mmHg 98.4 F (36.9 C) 65  16  100 %  11/13/11 1027 114/60 mmHg - 68  - -       Recent laboratory studies:  Basename 11/12/11 0449 11-28-11 0700 11/10/11 0630 11/26/11 0535 11/08/11 1641  WBC 6.0 7.3 6.6 5.5 8.1  HGB 9.9* 10.7* 10.5* 10.3* 11.3*  HCT 30.6* 33.5* 32.5* 31.6* 33.8*  PLT 396 416* 376 312 359    Basename 11/12/11 0449  11-28-11 0700 11/10/11 0630 2011/11/26 0535 11/08/11 1641  NA 137 137 139 133* 129*  K 3.6 3.7 3.2* 3.5 4.2  CL 99 98 98 94* 90*  CO2 30 32 33* 31 32  BUN 8 9 10 11 15   CREATININE 0.78 0.79 0.77 0.88 0.94  GLUCOSE 92 115* 100* 117* 124*  CALCIUM 8.4 8.6 8.1* 7.8* 8.4   Lab Results  Component Value Date   INR 1.24 11/08/2011   INR 0.91 03/23/2011   INR 0.9 04/23/2007     Recent Radiographic Studies :  Dg Chest Port 1 View  11-26-2011  *RADIOLOGY REPORT*  Clinical Data: Preoperative chest radiograph.  PORTABLE CHEST - 1 VIEW  Comparison: 03/30/2011.  Findings: Low lung volumes.  Low volumes accentuate the cardiopericardial silhouette.  Left greater than right basilar atelectasis.  Prominent skin fold is present in the right upper chest.  Lung markings are visualized peripheral to this.  No effusion is identified. No airspace disease or edema is identified.  IMPRESSION: Low volume chest with left greater than right basilar atelectasis.  Original Report Authenticated By: Andreas Newport, M.D.    DISCHARGE INSTRUCTIONS: Discharge Orders    Future Appointments: Provider: Department: Dept Phone: Center:   12/25/2011 11:15 AM Gardiner Barefoot, MD [facility] [phone] [facility]      DISCHARGE MEDICATIONS:   Medication List  As of 11/14/2011  8:37 AM   ASK your doctor about these medications         aspirin 81 MG tablet   Take 81 mg by mouth daily.      dorzolamide-timolol 22.3-6.8 MG/ML ophthalmic solution   Commonly known as: COSOPT   Place 1 drop into both eyes 2 (two) times daily.      gabapentin 300 MG capsule   Commonly known as: NEURONTIN   Take 300 mg by mouth at bedtime.      latanoprost 0.005 % ophthalmic solution   Commonly known as: XALATAN   Place 1 drop into both eyes at bedtime.      levothyroxine 100 MCG tablet   Commonly known as: SYNTHROID, LEVOTHROID   Take 100 mcg by mouth daily.      multivitamin tablet   Take 1 tablet by mouth daily.      oxyCODONE 15 MG  immediate release tablet   Commonly known as:  ROXICODONE   Take 15 mg by mouth every 4 (four) hours as needed. For back pain        propranolol 20 MG tablet   Commonly known as: INDERAL   Take 20 mg by mouth daily. Takes for migraines.      triamterene-hydrochlorothiazide 37.5-25 MG per capsule   Commonly known as: DYAZIDE   Take 0.5 capsules by mouth every morning. For fluid            FOLLOW UP VISIT:   Follow-up Information    Follow up with Staci Righter, MD in 6 weeks. (call to make appointment)    Contact information:   [address] [place] [address] [phone]       Follow up with CAFFREY JR,W D, MD. (follow up on 11/22/11, call to make appt)    Contact information:   [facility], [facility] [address] [place] [address] [phone]          DISPOSITION:  Home with home health  01-Home or Self Care  CONDITION:  Stable   Margart Sickles 11/14/2011, 8:37 AM

## 2011-11-14 NOTE — Discharge Instructions (Signed)
Diet: As you were doing prior to hospitalization  Activity:  Increase activity slowly as tolerated                  No lifting or driving for 6 weeks  Pain Medication:  You were given a long acting and short acting pain medicine, do not mix with home dose, take only as prescribed and if needed.   Antibiotics/Labs:  You will be getting daily IV antibiotics, and labs drawn weekly, lab results will be sent to infectious disease office for review                  Instruction for Home Health:  Pt to get weekly labs drawn starting 3/17 or 3/18 with results sent to the infectious disease office for review. (CBC, CMP, CRP, ESR, vanc trough)  Shower:  May shower starting Thursday wash soap and water gently over incisions, pat dry, NO SOAKING in tubs.  Dressing:  You may change your dressing on Wednesday                    Then change the dressing daily with sterile 4"x4"s gauze dressing                    Weight Bearing:   weight bearing as taught in physical therapy.  Use a walker or                    Crutches as instructed.  To prevent constipation: you may use a stool softener such as -               Colace ( over the counter) 100 mg by mouth twice a day                Drink plenty of fluids ( prune juice may be helpful) and high fiber foods                Miralax ( over the counter) for constipation as needed.    Precautions:  If you experience chest pain or shortness of breath - call 911 immediately               For transfer to the hospital emergency department!!               If you develop a fever greater that 101 F, purulent drainage from wound,                             increased redness or drainage from wound, or calf pain -- Call the office at                                                 [phone].  Follow- Up Appointment:  Please call for an appointment to be seen on 11/22/11               [facility] - [phone]               [facility] - [phone]  [place] - [phone]    Home Health RN and Physical Therapist to be provided by [facility]- [phone]

## 2011-11-14 NOTE — Progress Notes (Signed)
D/c instructions reviewed with pt and family.  Rx x 3 given.  Home Health set up with [facility].  Left upper arm PICC in place.  Pt received today's dose of Vancomycin early before discharge to home.  Patient will receive next dose tomorrow by home health RN.  Dressing changed.  All questions answered.

## 2011-11-15 DIAGNOSIS — T8450XA Infection and inflammatory reaction due to unspecified internal joint prosthesis, initial encounter: Secondary | ICD-10-CM | POA: Diagnosis not present

## 2011-11-15 DIAGNOSIS — Z792 Long term (current) use of antibiotics: Secondary | ICD-10-CM | POA: Diagnosis not present

## 2011-11-15 DIAGNOSIS — M130 Polyarthritis, unspecified: Secondary | ICD-10-CM | POA: Diagnosis not present

## 2011-11-15 DIAGNOSIS — Z96659 Presence of unspecified artificial knee joint: Secondary | ICD-10-CM | POA: Diagnosis not present

## 2011-11-15 DIAGNOSIS — Z452 Encounter for adjustment and management of vascular access device: Secondary | ICD-10-CM | POA: Diagnosis not present

## 2011-11-15 DIAGNOSIS — T847XXA Infection and inflammatory reaction due to other internal orthopedic prosthetic devices, implants and grafts, initial encounter: Secondary | ICD-10-CM | POA: Diagnosis not present

## 2011-11-15 LAB — CULTURE, BLOOD (ROUTINE X 2)
Culture  Setup Time: 201303080123
Culture: NO GROWTH

## 2011-11-16 DIAGNOSIS — Z452 Encounter for adjustment and management of vascular access device: Secondary | ICD-10-CM | POA: Diagnosis not present

## 2011-11-16 DIAGNOSIS — M130 Polyarthritis, unspecified: Secondary | ICD-10-CM | POA: Diagnosis not present

## 2011-11-16 DIAGNOSIS — Z96659 Presence of unspecified artificial knee joint: Secondary | ICD-10-CM | POA: Diagnosis not present

## 2011-11-16 DIAGNOSIS — Z792 Long term (current) use of antibiotics: Secondary | ICD-10-CM | POA: Diagnosis not present

## 2011-11-16 DIAGNOSIS — T8450XA Infection and inflammatory reaction due to unspecified internal joint prosthesis, initial encounter: Secondary | ICD-10-CM | POA: Diagnosis not present

## 2011-11-16 LAB — ANAEROBIC CULTURE

## 2011-11-19 DIAGNOSIS — Z96659 Presence of unspecified artificial knee joint: Secondary | ICD-10-CM | POA: Diagnosis not present

## 2011-11-19 DIAGNOSIS — Z792 Long term (current) use of antibiotics: Secondary | ICD-10-CM | POA: Diagnosis not present

## 2011-11-19 DIAGNOSIS — M130 Polyarthritis, unspecified: Secondary | ICD-10-CM | POA: Diagnosis not present

## 2011-11-19 DIAGNOSIS — M009 Pyogenic arthritis, unspecified: Secondary | ICD-10-CM | POA: Diagnosis not present

## 2011-11-19 DIAGNOSIS — T8450XA Infection and inflammatory reaction due to unspecified internal joint prosthesis, initial encounter: Secondary | ICD-10-CM | POA: Diagnosis not present

## 2011-11-19 DIAGNOSIS — Z452 Encounter for adjustment and management of vascular access device: Secondary | ICD-10-CM | POA: Diagnosis not present

## 2011-11-21 DIAGNOSIS — M130 Polyarthritis, unspecified: Secondary | ICD-10-CM | POA: Diagnosis not present

## 2011-11-21 DIAGNOSIS — Z96659 Presence of unspecified artificial knee joint: Secondary | ICD-10-CM | POA: Diagnosis not present

## 2011-11-21 DIAGNOSIS — T8450XA Infection and inflammatory reaction due to unspecified internal joint prosthesis, initial encounter: Secondary | ICD-10-CM | POA: Diagnosis not present

## 2011-11-21 DIAGNOSIS — Z792 Long term (current) use of antibiotics: Secondary | ICD-10-CM | POA: Diagnosis not present

## 2011-11-21 DIAGNOSIS — Z452 Encounter for adjustment and management of vascular access device: Secondary | ICD-10-CM | POA: Diagnosis not present

## 2011-11-22 DIAGNOSIS — T847XXA Infection and inflammatory reaction due to other internal orthopedic prosthetic devices, implants and grafts, initial encounter: Secondary | ICD-10-CM | POA: Diagnosis not present

## 2011-11-23 DIAGNOSIS — T8450XA Infection and inflammatory reaction due to unspecified internal joint prosthesis, initial encounter: Secondary | ICD-10-CM | POA: Diagnosis not present

## 2011-11-23 DIAGNOSIS — M130 Polyarthritis, unspecified: Secondary | ICD-10-CM | POA: Diagnosis not present

## 2011-11-23 DIAGNOSIS — Z96659 Presence of unspecified artificial knee joint: Secondary | ICD-10-CM | POA: Diagnosis not present

## 2011-11-23 DIAGNOSIS — Z452 Encounter for adjustment and management of vascular access device: Secondary | ICD-10-CM | POA: Diagnosis not present

## 2011-11-23 DIAGNOSIS — Z792 Long term (current) use of antibiotics: Secondary | ICD-10-CM | POA: Diagnosis not present

## 2011-11-26 DIAGNOSIS — Z792 Long term (current) use of antibiotics: Secondary | ICD-10-CM | POA: Diagnosis not present

## 2011-11-26 DIAGNOSIS — Z452 Encounter for adjustment and management of vascular access device: Secondary | ICD-10-CM | POA: Diagnosis not present

## 2011-11-26 DIAGNOSIS — Z96659 Presence of unspecified artificial knee joint: Secondary | ICD-10-CM | POA: Diagnosis not present

## 2011-11-26 DIAGNOSIS — M130 Polyarthritis, unspecified: Secondary | ICD-10-CM | POA: Diagnosis not present

## 2011-11-26 DIAGNOSIS — T8450XA Infection and inflammatory reaction due to unspecified internal joint prosthesis, initial encounter: Secondary | ICD-10-CM | POA: Diagnosis not present

## 2011-11-27 DIAGNOSIS — M130 Polyarthritis, unspecified: Secondary | ICD-10-CM | POA: Diagnosis not present

## 2011-11-27 DIAGNOSIS — T8450XA Infection and inflammatory reaction due to unspecified internal joint prosthesis, initial encounter: Secondary | ICD-10-CM | POA: Diagnosis not present

## 2011-11-27 DIAGNOSIS — Z96659 Presence of unspecified artificial knee joint: Secondary | ICD-10-CM | POA: Diagnosis not present

## 2011-11-27 DIAGNOSIS — Z452 Encounter for adjustment and management of vascular access device: Secondary | ICD-10-CM | POA: Diagnosis not present

## 2011-11-27 DIAGNOSIS — Z792 Long term (current) use of antibiotics: Secondary | ICD-10-CM | POA: Diagnosis not present

## 2011-11-28 DIAGNOSIS — T8450XA Infection and inflammatory reaction due to unspecified internal joint prosthesis, initial encounter: Secondary | ICD-10-CM | POA: Diagnosis not present

## 2011-11-28 DIAGNOSIS — Z792 Long term (current) use of antibiotics: Secondary | ICD-10-CM | POA: Diagnosis not present

## 2011-11-28 DIAGNOSIS — M130 Polyarthritis, unspecified: Secondary | ICD-10-CM | POA: Diagnosis not present

## 2011-11-28 DIAGNOSIS — Z452 Encounter for adjustment and management of vascular access device: Secondary | ICD-10-CM | POA: Diagnosis not present

## 2011-11-28 DIAGNOSIS — Z96659 Presence of unspecified artificial knee joint: Secondary | ICD-10-CM | POA: Diagnosis not present

## 2011-11-29 ENCOUNTER — Telehealth: Payer: Self-pay | Admitting: *Deleted

## 2011-11-29 NOTE — Telephone Encounter (Signed)
Her dtr [facility] called asking for a different time for her mom's f/u appt.  States she gives her the IV meds at 11am & is done by 1:30. She is unable to alter her schedule to give the meds at a different time. The pt's husband will bring her on the 16th at 2pm. To office manager to change the appt & delete the one she had 12/25/11.

## 2011-11-30 DIAGNOSIS — Z452 Encounter for adjustment and management of vascular access device: Secondary | ICD-10-CM | POA: Diagnosis not present

## 2011-11-30 DIAGNOSIS — T8450XA Infection and inflammatory reaction due to unspecified internal joint prosthesis, initial encounter: Secondary | ICD-10-CM | POA: Diagnosis not present

## 2011-11-30 DIAGNOSIS — Z96659 Presence of unspecified artificial knee joint: Secondary | ICD-10-CM | POA: Diagnosis not present

## 2011-11-30 DIAGNOSIS — Z792 Long term (current) use of antibiotics: Secondary | ICD-10-CM | POA: Diagnosis not present

## 2011-11-30 DIAGNOSIS — M130 Polyarthritis, unspecified: Secondary | ICD-10-CM | POA: Diagnosis not present

## 2011-12-02 DIAGNOSIS — M869 Osteomyelitis, unspecified: Secondary | ICD-10-CM | POA: Diagnosis not present

## 2011-12-02 DIAGNOSIS — Z79899 Other long term (current) drug therapy: Secondary | ICD-10-CM | POA: Diagnosis not present

## 2011-12-02 DIAGNOSIS — Z792 Long term (current) use of antibiotics: Secondary | ICD-10-CM | POA: Diagnosis not present

## 2011-12-03 DIAGNOSIS — Z96659 Presence of unspecified artificial knee joint: Secondary | ICD-10-CM | POA: Diagnosis not present

## 2011-12-03 DIAGNOSIS — M130 Polyarthritis, unspecified: Secondary | ICD-10-CM | POA: Diagnosis not present

## 2011-12-03 DIAGNOSIS — Z452 Encounter for adjustment and management of vascular access device: Secondary | ICD-10-CM | POA: Diagnosis not present

## 2011-12-03 DIAGNOSIS — Z792 Long term (current) use of antibiotics: Secondary | ICD-10-CM | POA: Diagnosis not present

## 2011-12-03 DIAGNOSIS — T8450XA Infection and inflammatory reaction due to unspecified internal joint prosthesis, initial encounter: Secondary | ICD-10-CM | POA: Diagnosis not present

## 2011-12-05 DIAGNOSIS — M130 Polyarthritis, unspecified: Secondary | ICD-10-CM | POA: Diagnosis not present

## 2011-12-05 DIAGNOSIS — Z792 Long term (current) use of antibiotics: Secondary | ICD-10-CM | POA: Diagnosis not present

## 2011-12-05 DIAGNOSIS — T8450XA Infection and inflammatory reaction due to unspecified internal joint prosthesis, initial encounter: Secondary | ICD-10-CM | POA: Diagnosis not present

## 2011-12-05 DIAGNOSIS — Z96659 Presence of unspecified artificial knee joint: Secondary | ICD-10-CM | POA: Diagnosis not present

## 2011-12-05 DIAGNOSIS — Z452 Encounter for adjustment and management of vascular access device: Secondary | ICD-10-CM | POA: Diagnosis not present

## 2011-12-06 ENCOUNTER — Other Ambulatory Visit: Payer: Self-pay | Admitting: Orthopedic Surgery

## 2011-12-06 ENCOUNTER — Ambulatory Visit
Admission: RE | Admit: 2011-12-06 | Discharge: 2011-12-06 | Disposition: A | Discharge status: Home / Self Care | Payer: Medicare Other | Source: Ambulatory Visit | Attending: Orthopedic Surgery | Admitting: Orthopedic Surgery

## 2011-12-06 DIAGNOSIS — M79606 Pain in leg, unspecified: Secondary | ICD-10-CM

## 2011-12-06 DIAGNOSIS — M79609 Pain in unspecified limb: Secondary | ICD-10-CM | POA: Diagnosis not present

## 2011-12-10 DIAGNOSIS — L089 Local infection of the skin and subcutaneous tissue, unspecified: Secondary | ICD-10-CM | POA: Diagnosis not present

## 2011-12-10 DIAGNOSIS — M130 Polyarthritis, unspecified: Secondary | ICD-10-CM | POA: Diagnosis not present

## 2011-12-10 DIAGNOSIS — Z452 Encounter for adjustment and management of vascular access device: Secondary | ICD-10-CM | POA: Diagnosis not present

## 2011-12-10 DIAGNOSIS — Z792 Long term (current) use of antibiotics: Secondary | ICD-10-CM | POA: Diagnosis not present

## 2011-12-10 DIAGNOSIS — Z96659 Presence of unspecified artificial knee joint: Secondary | ICD-10-CM | POA: Diagnosis not present

## 2011-12-10 DIAGNOSIS — T8450XA Infection and inflammatory reaction due to unspecified internal joint prosthesis, initial encounter: Secondary | ICD-10-CM | POA: Diagnosis not present

## 2011-12-11 ENCOUNTER — Telehealth: Payer: Self-pay | Admitting: *Deleted

## 2011-12-11 NOTE — Telephone Encounter (Signed)
Call from [place], nurse with [facility], patient has hospital f/u 12/18/11 with Dr. Luciana Axe.  She is on Vancomycin 1.5 g once daily for six weeks, she has been on this for four weeks.  She has developed a rash and they would like orders to either increase fluid, give Benadryl, or change antibiotic. Her Vanc level has been in the normal range. Call back number [phone]. Wendall Mola CMA

## 2011-12-11 NOTE — Telephone Encounter (Signed)
Sandy with [facility] notified. Wendall Mola CMA

## 2011-12-11 NOTE — Telephone Encounter (Signed)
Please ask the patient to use as needed Benadryl for the next 24-48 hours. If this is not helping have them call back for further instructions.

## 2011-12-12 ENCOUNTER — Telehealth: Payer: Self-pay | Admitting: *Deleted

## 2011-12-12 DIAGNOSIS — Z452 Encounter for adjustment and management of vascular access device: Secondary | ICD-10-CM | POA: Diagnosis not present

## 2011-12-12 DIAGNOSIS — Z96659 Presence of unspecified artificial knee joint: Secondary | ICD-10-CM | POA: Diagnosis not present

## 2011-12-12 DIAGNOSIS — T8450XA Infection and inflammatory reaction due to unspecified internal joint prosthesis, initial encounter: Secondary | ICD-10-CM | POA: Diagnosis not present

## 2011-12-12 DIAGNOSIS — Z792 Long term (current) use of antibiotics: Secondary | ICD-10-CM | POA: Diagnosis not present

## 2011-12-12 DIAGNOSIS — M130 Polyarthritis, unspecified: Secondary | ICD-10-CM | POA: Diagnosis not present

## 2011-12-12 NOTE — Telephone Encounter (Signed)
Patient daughter called advised that patient has a thick, red, itchy rash on her stomach, chest and back. That started yesterday and they think it is from the IV Vancomycin. She has not seen our provider in the clinic yet but was given orders by Dr Luciana Axe upon leaving the hospital. She uses [place] [facility] [phone]) and [facility] [phone]) will call them and hold the Vanc, per [place], until she see a provider which is scheduled for 12/14/11 at 1045 with [place]. At which time we will call the pharmacy and give med orders per provider. She sees [place] [facility] only on Mondays so will call them also after visit on Friday 12/14/11. Advised daughter of all this and she is on board with plan.

## 2011-12-14 ENCOUNTER — Encounter: Payer: Self-pay | Admitting: Internal Medicine

## 2011-12-14 ENCOUNTER — Ambulatory Visit (INDEPENDENT_AMBULATORY_CARE_PROVIDER_SITE_OTHER): Payer: Medicare Other | Admitting: Internal Medicine

## 2011-12-14 VITALS — BP 158/72 | HR 69 | Temp 97.6°F | Wt 167.0 lb

## 2011-12-14 DIAGNOSIS — T50904A Poisoning by unspecified drugs, medicaments and biological substances, undetermined, initial encounter: Secondary | ICD-10-CM

## 2011-12-14 DIAGNOSIS — Z85850 Personal history of malignant neoplasm of thyroid: Secondary | ICD-10-CM | POA: Insufficient documentation

## 2011-12-14 DIAGNOSIS — L270 Generalized skin eruption due to drugs and medicaments taken internally: Secondary | ICD-10-CM | POA: Diagnosis not present

## 2011-12-14 DIAGNOSIS — B951 Streptococcus, group B, as the cause of diseases classified elsewhere: Secondary | ICD-10-CM

## 2011-12-14 DIAGNOSIS — R600 Localized edema: Secondary | ICD-10-CM | POA: Insufficient documentation

## 2011-12-14 DIAGNOSIS — R609 Edema, unspecified: Secondary | ICD-10-CM | POA: Diagnosis not present

## 2011-12-14 DIAGNOSIS — T8450XA Infection and inflammatory reaction due to unspecified internal joint prosthesis, initial encounter: Secondary | ICD-10-CM | POA: Diagnosis not present

## 2011-12-14 DIAGNOSIS — A491 Streptococcal infection, unspecified site: Secondary | ICD-10-CM | POA: Insufficient documentation

## 2011-12-14 LAB — BASIC METABOLIC PANEL
BUN: 7 mg/dL (ref 6–23)
CO2: 26 mEq/L (ref 19–32)
Calcium: 7.3 mg/dL — ABNORMAL LOW (ref 8.4–10.5)
Glucose, Bld: 111 mg/dL — ABNORMAL HIGH (ref 70–99)
Potassium: 4.1 mEq/L (ref 3.5–5.3)
Sodium: 134 mEq/L — ABNORMAL LOW (ref 135–145)

## 2011-12-14 LAB — CBC WITH DIFFERENTIAL/PLATELET
Basophils Relative: 1 % (ref 0–1)
Eosinophils Absolute: 0.8 10*3/uL — ABNORMAL HIGH (ref 0.0–0.7)
HCT: 32.6 % — ABNORMAL LOW (ref 36.0–46.0)
Hemoglobin: 9.9 g/dL — ABNORMAL LOW (ref 12.0–15.0)
Lymphs Abs: 0.9 10*3/uL (ref 0.7–4.0)
MCH: 28.4 pg (ref 26.0–34.0)
MCHC: 30.4 g/dL (ref 30.0–36.0)
Monocytes Absolute: 0.6 10*3/uL (ref 0.1–1.0)
Monocytes Relative: 11 % (ref 3–12)
Neutrophils Relative %: 60 % (ref 43–77)
RBC: 3.48 MIL/uL — ABNORMAL LOW (ref 3.87–5.11)

## 2011-12-14 MED ORDER — HYDROXYZINE HCL 10 MG PO TABS: 10.0000 mg | ORAL_TABLET | Freq: Three times a day (TID) | ORAL | Status: AC | PRN

## 2011-12-14 MED ORDER — HYDROCORTISONE 1 % EX CREA: TOPICAL_CREAM | CUTANEOUS | Status: DC

## 2011-12-14 MED ORDER — FUROSEMIDE 20 MG PO TABS: 20.0000 mg | ORAL_TABLET | Freq: Every day | ORAL | Status: DC

## 2011-12-14 NOTE — Progress Notes (Signed)
Labs drawn from PICC per order from Dr Drue Second.  PICC site unremarkable.  Blood sample was limited due to PICC flow being slow.  Laurell Josephs , RN

## 2011-12-14 NOTE — Progress Notes (Signed)
INFECTIOUS DISEASE CLINIC- HOSPITAL FOLLOW UP VISIT  RFV: follow up for IV antibiotics for PJI, now with rash  Subjective:    Patient ID: [name], female    DOB: [birth year], 78 y.o.   MRN: [id number]  HPI [name] is a 78 y.o. female with bilateral knee replacement, right knee done 03/2011. Over the past few weeks she had right leg swelling and tenderness, fevers, malaise. Labs in [place] of 2012 were negative for infection. Yesterday she presented in the clinic with large R knee effusion and right leg edema. 60 cc were asprated and sent fror Gram stain and culture. Gram stain was negative for organisms,no crystal, 76,000 WBCs. Now with GBS in wound. The patient underwent polyexchange and washout on March 9th. She was discharged to home to take vancomycin for group b streptococcus infection of deep wound within thigh but not in the synovial fluid. The patient has been doing well on vancomycin up until the last 7-10 days. She started to have a rash initial on torso but now spread to legs and arms. She reports the rash intensely pruritic. She started to take benadryl but had noticeable side effect of feeling intoxicated. She has stopped doing her vanco infusion 3 days prior to visit, but rash is still present. She denies any fever/chills/nightsweats/n/v/d/but does have significant lower extremity swelling, gaining roughly 20 lbs over the last 3 wks. She feels her legs being tight including her knees but denies warmth or erythema coming from her knee.  Allergies  Allergen Reactions  . Ativan Anxiety  . Demerol Shortness Of Breath  . Penicillins Swelling    "in the face"  . Tetracyclines & Related Rash    Prior to Admission medications   Medication Sig Start Date End Date Taking? Authorizing Provider  dorzolamide-timolol (COSOPT) 22.3-6.8 MG/ML ophthalmic solution Place 1 drop into both eyes 2 (two) times daily.     Yes Historical Provider, MD  enoxaparin (LOVENOX) 30 MG/0.3ML SOLN Inject  0.3 mLs (30 mg total) into the skin every 12 (twelve) hours. 11/14/11  Yes Joshua Chadwell, PA-C  gabapentin (NEURONTIN) 300 MG capsule Take 300 mg by mouth at bedtime.     Yes Historical Provider, MD  latanoprost (XALATAN) 0.005 % ophthalmic solution Place 1 drop into both eyes at bedtime.     Yes Historical Provider, MD  levothyroxine (SYNTHROID, LEVOTHROID) 100 MCG tablet Take 100 mcg by mouth daily.     Yes Historical Provider, MD  Multiple Vitamin (MULTIVITAMIN) tablet Take 1 tablet by mouth daily.     Yes Historical Provider, MD  propranolol (INDERAL) 20 MG tablet Take 20 mg by mouth daily. Takes for migraines.   Yes Historical Provider, MD  triamterene-hydrochlorothiazide (DYAZIDE) 37.5-25 MG per capsule Take 0.5 capsules by mouth every morning. For fluid    Yes Historical Provider, MD  furosemide (LASIX) 20 MG tablet Take 1 tablet (20 mg total) by mouth daily. 12/14/11 12/13/12  Judyann Munson, MD  hydrocortisone cream 1 % Apply to affected area 2 times daily; 12/14/11 12/13/12  Judyann Munson, MD  hydrOXYzine (ATARAX/VISTARIL) 10 MG tablet Take 1 tablet (10 mg total) by mouth 3 (three) times daily as needed for itching. 12/14/11 12/24/11  Judyann Munson, MD  sodium chloride 0.9 % SOLN 500 mL with vancomycin 1000 MG SOLR 1,500 mg Inject 1,500 mg into the vein daily. 11/14/11   Margart Sickles, PA-C   Active Ambulatory Problems    Diagnosis Date Noted  . Hypertension 01/05/2011   Resolved Ambulatory  Problems    Diagnosis Date Noted  . No Resolved Ambulatory Problems   Past Medical History  Diagnosis Date  . Angina   . Headache   . Hypoglycemia   . Fracture 2008  . Macular degeneration of both eyes   . Glaucoma   . Pneumonia   . Shortness of breath 11/08/11  . Hypothyroidism   . Blood transfusion   . Anemia   . GERD (gastroesophageal reflux disease)   . H/O hiatal hernia   . Arthritis   . Chronic back pain   . Thyroid cancer 24   Fh: family history is not on file. Sh:  History    Substance Use Topics  . Smoking status: Never Smoker   . Smokeless tobacco: Never Used  . Alcohol Use: No    Review of Systems Constitutional: Negative for fever, chills, diaphoresis, activity change, appetite change, fatigue and unexpected weight gain of #20 over 3 wks. HENT: Negative for congestion, sore throat, rhinorrhea, sneezing, trouble swallowing and sinus pressure.  Eyes: Negative for photophobia and visual disturbance.  Respiratory: Negative for cough, chest tightness, shortness of breath, wheezing and stridor.  Cardiovascular: Negative for chest pain, palpitations and leg swelling.  Gastrointestinal: Negative for nausea, vomiting, abdominal pain, diarrhea, constipation, blood in stool, abdominal distention and anal bleeding.  Genitourinary: Negative for dysuria, hematuria, flank pain and difficulty urinating.  Musculoskeletal: Negative for myalgias, back pain, joint swelling, arthralgias and gait problem.  Skin: pruritic diffuse rash throughout torso, back extremities spares face  Neurological: Negative for dizziness, tremors, weakness and light-headedness.  Hematological: Negative for adenopathy. Does not bruise/bleed easily.  Psychiatric/Behavioral: Negative for behavioral problems, confusion, sleep disturbance, dysphoric mood, decreased concentration and agitation.       Objective:   Physical Exam  BP 158/72  Pulse 69  Temp(Src) 97.6 F (36.4 C) (Oral)  Wt 167 lb (75.751 kg)  General Appearance:    Alert, cooperative, no distress, appears stated age  Head:    Normocephalic, without obvious abnormality, atraumatic  Eyes:    PERRL, conjunctiva/corneas clear, EOM's intact,     Nose:   Nares normal, septum midline, mucosa normal, no drainage    or sinus tenderness  Throat:   Lips, mucosa, and tongue normal; teeth and gums normal  Neck:   Supple, symmetrical, trachea midline, no adenopathy;      Back:     Symmetric, no curvature, ROM normal, no CVA tenderness   Lungs:     Clear to auscultation bilaterally, respirations unlabored      Heart:    Regular rate and rhythm, S1 and S2 normal, no murmur, rub   or gallop     Abdomen:     Soft, non-tender, bowel sounds active all four quadrants,    no masses, no organomegaly        Extremities:   Right knee incision is intact, no erythema or exudate  Pulses:   2+ and symmetric all extremities  Skin:   Diffuse macular papular rash throughout torso, back, belly, arms, and legs, spares face.  Lymph nodes:   Cervical, supraclavicular, and axillary nodes normal  Neurologic:   CNII-XII intact, moves all 4 extemities.        Assessment & Plan:  78yo F with Group B strep deep tissue infection/PJI on vancomycin since she has significant penicillin allergy but now has developed allergic related rash while on her 4th week of vancomycin.  Drug allergy = will check CBC with diff to see how significant her  eosinophiia is on differential. Will recommend hydrocortisone cream, hydroxyzine as needed for itching. Will wait on initiating oral steroids since we want to avoid medications that would cause fluid retention.  Lower extremity edema= unclear if it is related to her drug allergy, we will do gentle diuresis with lasix 40mg  daily. Have asked patient to weigh herself daily. We will see her back on Tuesday to repeat bmp and cbc to see if need to increase diuretics.  pcp = we will have the patient see Dr. Clent Ridges at [place]. She has NP appt on Friday the 19th as an addn follow up.  pji vs. Osteo = will have the patient switch to daptomycin. We will coordinate with home health how to have her first dose either at home or infusion center.  rtc on tuesday

## 2011-12-15 DIAGNOSIS — Z792 Long term (current) use of antibiotics: Secondary | ICD-10-CM | POA: Diagnosis not present

## 2011-12-15 DIAGNOSIS — Z96659 Presence of unspecified artificial knee joint: Secondary | ICD-10-CM | POA: Diagnosis not present

## 2011-12-15 DIAGNOSIS — M130 Polyarthritis, unspecified: Secondary | ICD-10-CM | POA: Diagnosis not present

## 2011-12-15 DIAGNOSIS — Z452 Encounter for adjustment and management of vascular access device: Secondary | ICD-10-CM | POA: Diagnosis not present

## 2011-12-15 DIAGNOSIS — T8450XA Infection and inflammatory reaction due to unspecified internal joint prosthesis, initial encounter: Secondary | ICD-10-CM | POA: Diagnosis not present

## 2011-12-17 ENCOUNTER — Other Ambulatory Visit (HOSPITAL_COMMUNITY): Payer: Self-pay | Admitting: *Deleted

## 2011-12-17 ENCOUNTER — Telehealth: Payer: Self-pay | Admitting: Licensed Clinical Social Worker

## 2011-12-17 ENCOUNTER — Telehealth: Payer: Self-pay | Admitting: *Deleted

## 2011-12-17 ENCOUNTER — Other Ambulatory Visit: Payer: Self-pay | Admitting: Internal Medicine

## 2011-12-17 DIAGNOSIS — Z96659 Presence of unspecified artificial knee joint: Secondary | ICD-10-CM | POA: Diagnosis not present

## 2011-12-17 DIAGNOSIS — Z452 Encounter for adjustment and management of vascular access device: Secondary | ICD-10-CM | POA: Diagnosis not present

## 2011-12-17 DIAGNOSIS — L089 Local infection of the skin and subcutaneous tissue, unspecified: Secondary | ICD-10-CM | POA: Diagnosis not present

## 2011-12-17 DIAGNOSIS — M130 Polyarthritis, unspecified: Secondary | ICD-10-CM | POA: Diagnosis not present

## 2011-12-17 DIAGNOSIS — T8450XA Infection and inflammatory reaction due to unspecified internal joint prosthesis, initial encounter: Secondary | ICD-10-CM

## 2011-12-17 DIAGNOSIS — Z79899 Other long term (current) drug therapy: Secondary | ICD-10-CM | POA: Diagnosis not present

## 2011-12-17 DIAGNOSIS — Z792 Long term (current) use of antibiotics: Secondary | ICD-10-CM | POA: Diagnosis not present

## 2011-12-17 NOTE — Telephone Encounter (Signed)
Nurse from [place] called and asked if she is still to draw labs on the patient since Vancomycin was stopped. Advised yes as patient is set up for new IV medication Daptomicyn which she will have her first dose tomorrow 12/18/11 and they should get orders soon. [place] fax number is [phone]. She also asked for [place] to put a PRN on the order for extra visits or labs when needed.

## 2011-12-17 NOTE — Telephone Encounter (Signed)
Per Dr. Drue Second patient needed to have first dose set up at short stay of Daptomycin 6 mg/kg. Patient had a bad reaction to Vancomycin, and Genevieve Norlander will not administer first dose. I have the patient set up to have first dose tomorrow 12/18/2011 at 12:00noon after her appointment her with Dr. Drue Second.

## 2011-12-18 ENCOUNTER — Inpatient Hospital Stay: Payer: Medicare Other | Admitting: Internal Medicine

## 2011-12-18 ENCOUNTER — Encounter (HOSPITAL_COMMUNITY)
Admission: RE | Admit: 2011-12-18 | Discharge: 2011-12-18 | Disposition: A | Discharge status: Home / Self Care | Payer: Medicare Other | Source: Ambulatory Visit | Attending: Internal Medicine | Admitting: Internal Medicine

## 2011-12-18 ENCOUNTER — Encounter: Payer: Self-pay | Admitting: Internal Medicine

## 2011-12-18 ENCOUNTER — Ambulatory Visit (INDEPENDENT_AMBULATORY_CARE_PROVIDER_SITE_OTHER): Payer: Medicare Other | Admitting: Internal Medicine

## 2011-12-18 VITALS — BP 134/61 | HR 75 | Temp 97.9°F | Wt 166.0 lb

## 2011-12-18 DIAGNOSIS — Y831 Surgical operation with implant of artificial internal device as the cause of abnormal reaction of the patient, or of later complication, without mention of misadventure at the time of the procedure: Secondary | ICD-10-CM | POA: Insufficient documentation

## 2011-12-18 DIAGNOSIS — R609 Edema, unspecified: Secondary | ICD-10-CM | POA: Diagnosis not present

## 2011-12-18 DIAGNOSIS — R600 Localized edema: Secondary | ICD-10-CM

## 2011-12-18 DIAGNOSIS — T8450XA Infection and inflammatory reaction due to unspecified internal joint prosthesis, initial encounter: Secondary | ICD-10-CM | POA: Insufficient documentation

## 2011-12-18 LAB — BASIC METABOLIC PANEL
BUN: 12 mg/dL (ref 6–23)
Calcium: 6.9 mg/dL — ABNORMAL LOW (ref 8.4–10.5)
Creat: 0.85 mg/dL (ref 0.50–1.10)

## 2011-12-18 LAB — CBC WITH DIFFERENTIAL/PLATELET
Basophils Absolute: 0.1 10*3/uL (ref 0.0–0.1)
Basophils Relative: 1 % (ref 0–1)
Eosinophils Relative: 15 % — ABNORMAL HIGH (ref 0–5)
HCT: 32.9 % — ABNORMAL LOW (ref 36.0–46.0)
MCHC: 30.4 g/dL (ref 30.0–36.0)
MCV: 92.9 fL (ref 78.0–100.0)
Monocytes Absolute: 0.9 10*3/uL (ref 0.1–1.0)
Platelets: 334 10*3/uL (ref 150–400)
RDW: 15.6 % — ABNORMAL HIGH (ref 11.5–15.5)

## 2011-12-18 LAB — PHOSPHORUS: Phosphorus: 3.7 mg/dL (ref 2.3–4.6)

## 2011-12-18 LAB — MAGNESIUM: Magnesium: 1.7 mg/dL (ref 1.5–2.5)

## 2011-12-18 MED ORDER — HEPARIN SOD (PORK) LOCK FLUSH 100 UNIT/ML IV SOLN
500.0000 [IU] | Freq: Once | INTRAVENOUS | Status: DC
  Filled 2011-12-18: qty 5

## 2011-12-18 MED ORDER — SODIUM CHLORIDE 0.9 % IV SOLN
6.0000 mg/kg | Freq: Once | INTRAVENOUS | Status: AC
  Administered 2011-12-18: 395 mg via INTRAVENOUS
  Filled 2011-12-18: qty 7.9

## 2011-12-18 NOTE — Progress Notes (Signed)
INFECTIOUS DISEASES CLINIC  RFV: follow up to vancomycin related drug rash and diuresis  Subjective:    Patient ID: [name], female    DOB: Jan 06, 1925, 78 y.o.   MRN: [id number]  HPI [name] is an 78yo Female who is status post polyethelene exchange 11/09/11 being treated for group b streptococcus prosthetic joint infection and deep wound/presumed osteomyelitis. She was placed on vancomycin due to significant penicillin allergy, however, 4 wks into her therapy, she develops a diffuse macular papular rash which is intensely pruritic. She stopped taking vancomycin on April 10th. We evaluated her on the 12th and started her on hydrocortisone cream and atarax. She states that her rash is somewhat improved although still significantly pruritic. Medicines improving her symptoms somewhat. She did start taking lasix 20mg  BID on Friday, and has only lost 2 lbs per our clinic scales. She states that she has been urinating a lot.  The patient doesn't feel significant improvement in her lower extremity edema. She states that she usually wears ted hose, but has not done so for the doctor's visit. Her lower extremities feel very tight, and she also notices her hands having edema. She reports many years ago, needing to take lasix to get "fluid off her body"  Today, the patient is scheduled to go to the infusion center at short stay clinic -> to get 1st dose of daptomycin under observation. The patient states that she is worried that she will also get another drug allergy.  Allergies  Allergen Reactions  . Ativan Anxiety  . Demerol Shortness Of Breath  . Penicillins Swelling    "in the face"  . Tetracyclines & Related Rash   Prior to Admission medications   Medication Sig Start Date End Date Taking? Authorizing Provider  dorzolamide-timolol (COSOPT) 22.3-6.8 MG/ML ophthalmic solution Place 1 drop into both eyes 2 (two) times daily.      Historical Provider, MD  enoxaparin (LOVENOX) 30 MG/0.3ML SOLN  Inject 0.3 mLs (30 mg total) into the skin every 12 (twelve) hours. 11/14/11   Margart Sickles, PA-C  furosemide (LASIX) 20 MG tablet Take 1 tablet (20 mg total) by mouth daily. 12/14/11 12/13/12  Judyann Munson, MD  gabapentin (NEURONTIN) 300 MG capsule Take 300 mg by mouth at bedtime.      Historical Provider, MD  hydrocortisone cream 1 % Apply to affected area 2 times daily; 12/14/11 12/13/12  Judyann Munson, MD  hydrOXYzine (ATARAX/VISTARIL) 10 MG tablet Take 1 tablet (10 mg total) by mouth 3 (three) times daily as needed for itching. 12/14/11 12/24/11  Judyann Munson, MD  latanoprost (XALATAN) 0.005 % ophthalmic solution Place 1 drop into both eyes at bedtime.      Historical Provider, MD  levothyroxine (SYNTHROID, LEVOTHROID) 100 MCG tablet Take 100 mcg by mouth daily.      Historical Provider, MD  Multiple Vitamin (MULTIVITAMIN) tablet Take 1 tablet by mouth daily.      Historical Provider, MD  propranolol (INDERAL) 20 MG tablet Take 20 mg by mouth daily. Takes for migraines.    Historical Provider, MD  sodium chloride 0.9 % SOLN 500 mL with vancomycin 1000 MG SOLR 1,500 mg Inject 1,500 mg into the vein daily. 11/14/11   Margart Sickles, PA-C  triamterene-hydrochlorothiazide (DYAZIDE) 37.5-25 MG per capsule Take 0.5 capsules by mouth every morning. For fluid     Historical Provider, MD   Social hx = married, lives with her husband in [place]. No alcohol or smoking  Family hx = family history  is not on file.  Review of Systems .10 point review of systems have been reviewed, and non-contributory other than what is mentioned in HPI.    Objective:   Physical Exam  BP 134/61  Pulse 75  Temp(Src) 97.9 F (36.6 C) (Oral)  Wt 166 lb (75.297 kg)   General Appearance:    Alert, cooperative, no distress, appears stated age  Head:    Normocephalic, without obvious abnormality, atraumatic  Eyes:    PERRL, conjunctiva/corneas clear, EOM's intact,  Ears:    Normal TM's and external ear canals,  both ears  Nose:   Nares normal, septum midline, mucosa normal, no drainage    or sinus tenderness  Throat:   Lips, mucosa, and tongue normal; teeth and gums normal  Neck:   Supple, symmetrical, trachea midline, no adenopathy;         Lungs:     Clear to auscultation bilaterally, respirations unlabored  Chest Wall:    Blanching rash more coalesced than noted on 4/12; less erythema. Less raised.   Heart:    Regular rate and rhythm, S1 and S2 normal, no murmur, rub   or gallop     Abdomen:     Soft, non-tender, bowel sounds active all four quadrants,    no masses, no organomegaly; rash involves abdomen        Extremities:   +3 pitting edema of lower extremities R>L. Increasing erythema on shins bilaterally due to edema plus drug rash. +1 edema on hands/arms; surgical site on right knee healed, non-erythamatous, non tender; no warmth.  Pulses:   2+ and symmetric all extremities  Skin:   Diffuse rash to torso, chest, arms and legs. Spares her face  Lymph nodes:   Cervical, supraclavicular, and axillary nodes normal  Neurologic:   CNII-XII intact, moves all extremities, sensation intake.        Assessment & Plan:  Drug rash = will continue to use hydroxyzine TID prn and hydrocortisone cream BID. Will avoid steroids since it can make fluid retention worse. Secondly, patient reports reaction to prednisone. Will check cbc with diff to see that eosinophilia is decreasing since cessation of vancomycin on 4/10  Fluid retention= patient has gained roughly 20 lbs since her surgery. I have started the patient on lasix 40mg  daily but only having 2 lb weight loss in 4 days. We will increase it to lasix 40mg  BID. I will check bmp, mg, phos. To see if she needs electrolyte replacement.   Patient will be seen at [facility] internal medicine this Friday for 1st visit. Will ask them to check weight, and diuretics to see if needs to increase  Will also have [facility] go to the patient's home from  4/17-4/19 to do daily weights and they will draw bmp,mg,phos,ck on Friday, then weekly  Prosthetic joint infection/deep tissue infection vs. Osteo with group b strep = patient is to finish an additional 2 wks of antibiotics to finish out a 6 wks course of parental therapy. She is getting 1st dose of dapto (dapto 395mg  Iv daily) today at infusion center, then for 2 wks through home health. We will see her back in clinic to decide upon her oral regimen. It maybe limited given her numerous drug allergies.   rtc in 2 wks

## 2011-12-18 NOTE — Progress Notes (Signed)
PICC line left arm accessed per protocol 10 cc blood waste, then Normal Saline 10 cc flush.   Post antibiotic infusion PICC flushed with 10 cc Normal Saline then Heparin instilled to dwell.

## 2011-12-19 ENCOUNTER — Encounter (HOSPITAL_COMMUNITY): Payer: Self-pay | Admitting: *Deleted

## 2011-12-19 ENCOUNTER — Telehealth: Payer: Self-pay | Admitting: *Deleted

## 2011-12-19 ENCOUNTER — Inpatient Hospital Stay (HOSPITAL_COMMUNITY)
Admission: EM | Admit: 2011-12-19 | Discharge: 2011-12-20 | DRG: 641 | Disposition: A | Discharge status: Home / Self Care | Payer: Medicare Other | Source: Ambulatory Visit | Attending: Family Medicine | Admitting: Family Medicine

## 2011-12-19 ENCOUNTER — Emergency Department (HOSPITAL_COMMUNITY): Payer: Medicare Other

## 2011-12-19 ENCOUNTER — Other Ambulatory Visit: Payer: Self-pay | Admitting: Internal Medicine

## 2011-12-19 DIAGNOSIS — L298 Other pruritus: Secondary | ICD-10-CM | POA: Diagnosis present

## 2011-12-19 DIAGNOSIS — Z85850 Personal history of malignant neoplasm of thyroid: Secondary | ICD-10-CM | POA: Diagnosis not present

## 2011-12-19 DIAGNOSIS — M79609 Pain in unspecified limb: Secondary | ICD-10-CM | POA: Diagnosis present

## 2011-12-19 DIAGNOSIS — G8929 Other chronic pain: Secondary | ICD-10-CM | POA: Diagnosis present

## 2011-12-19 DIAGNOSIS — L2989 Other pruritus: Secondary | ICD-10-CM | POA: Diagnosis present

## 2011-12-19 DIAGNOSIS — Z96659 Presence of unspecified artificial knee joint: Secondary | ICD-10-CM

## 2011-12-19 DIAGNOSIS — I10 Essential (primary) hypertension: Secondary | ICD-10-CM | POA: Diagnosis present

## 2011-12-19 DIAGNOSIS — R609 Edema, unspecified: Secondary | ICD-10-CM | POA: Diagnosis present

## 2011-12-19 DIAGNOSIS — H409 Unspecified glaucoma: Secondary | ICD-10-CM | POA: Diagnosis present

## 2011-12-19 DIAGNOSIS — E876 Hypokalemia: Principal | ICD-10-CM

## 2011-12-19 DIAGNOSIS — T368X5A Adverse effect of other systemic antibiotics, initial encounter: Secondary | ICD-10-CM | POA: Diagnosis present

## 2011-12-19 DIAGNOSIS — E039 Hypothyroidism, unspecified: Secondary | ICD-10-CM | POA: Diagnosis present

## 2011-12-19 DIAGNOSIS — I369 Nonrheumatic tricuspid valve disorder, unspecified: Secondary | ICD-10-CM | POA: Diagnosis not present

## 2011-12-19 DIAGNOSIS — I872 Venous insufficiency (chronic) (peripheral): Secondary | ICD-10-CM | POA: Diagnosis not present

## 2011-12-19 DIAGNOSIS — L270 Generalized skin eruption due to drugs and medicaments taken internally: Secondary | ICD-10-CM

## 2011-12-19 DIAGNOSIS — R600 Localized edema: Secondary | ICD-10-CM

## 2011-12-19 DIAGNOSIS — A491 Streptococcal infection, unspecified site: Secondary | ICD-10-CM | POA: Diagnosis present

## 2011-12-19 DIAGNOSIS — T8450XA Infection and inflammatory reaction due to unspecified internal joint prosthesis, initial encounter: Secondary | ICD-10-CM | POA: Diagnosis present

## 2011-12-19 LAB — BASIC METABOLIC PANEL
BUN: 9 mg/dL (ref 6–23)
BUN: 9 mg/dL (ref 6–23)
CO2: 31 mEq/L (ref 19–32)
Calcium: 7.5 mg/dL — ABNORMAL LOW (ref 8.4–10.5)
Calcium: 7.2 mg/dL — ABNORMAL LOW (ref 8.4–10.5)
Chloride: 96 mEq/L (ref 96–112)
Chloride: 97 mEq/L (ref 96–112)
Creatinine, Ser: 0.88 mg/dL (ref 0.50–1.10)
Creatinine, Ser: 0.82 mg/dL (ref 0.50–1.10)
Creatinine, Ser: 0.84 mg/dL (ref 0.50–1.10)
GFR calc Af Amer: 68 mL/min — ABNORMAL LOW (ref >90)
GFR calc Af Amer: 74 mL/min — ABNORMAL LOW (ref >90)
GFR calc non Af Amer: 59 mL/min — ABNORMAL LOW (ref >90)
Glucose, Bld: 147 mg/dL — ABNORMAL HIGH (ref 70–99)
Glucose, Bld: 116 mg/dL — ABNORMAL HIGH (ref 70–99)

## 2011-12-19 LAB — DIFFERENTIAL
Basophils Absolute: 0 10*3/uL (ref 0.0–0.1)
Lymphs Abs: 2.5 10*3/uL (ref 0.7–4.0)
Monocytes Relative: 8 % (ref 3–12)
Neutrophils Relative %: 50 % (ref 43–77)

## 2011-12-19 LAB — CBC
MCV: 89.5 fL (ref 78.0–100.0)
Platelets: 277 10*3/uL (ref 150–400)
RDW: 15.6 % — ABNORMAL HIGH (ref 11.5–15.5)
WBC: 8.8 10*3/uL (ref 4.0–10.5)

## 2011-12-19 MED ORDER — SODIUM CHLORIDE 0.9 % IJ SOLN: 3.0000 mL | INTRAMUSCULAR | Status: DC | PRN

## 2011-12-19 MED ORDER — DORZOLAMIDE HCL-TIMOLOL MAL 2-0.5 % OP SOLN
1.0000 [drp] | Freq: Two times a day (BID) | OPHTHALMIC | Status: DC
  Administered 2011-12-19 – 2011-12-20 (×2): 1 [drp] via OPHTHALMIC
  Filled 2011-12-19: qty 10

## 2011-12-19 MED ORDER — SODIUM CHLORIDE 0.9 % IJ SOLN: 3.0000 mL | Freq: Two times a day (BID) | INTRAMUSCULAR | Status: DC

## 2011-12-19 MED ORDER — SODIUM CHLORIDE 0.9 % IV SOLN: 250.0000 mL | INTRAVENOUS | Status: DC | PRN

## 2011-12-19 MED ORDER — HYDROCORTISONE 1 % EX CREA
TOPICAL_CREAM | Freq: Two times a day (BID) | CUTANEOUS | Status: DC
  Administered 2011-12-20: 11:00:00 via TOPICAL
  Filled 2011-12-19 (×2): qty 28

## 2011-12-19 MED ORDER — OXYCODONE HCL 5 MG PO TABS
15.0000 mg | ORAL_TABLET | Freq: Four times a day (QID) | ORAL | Status: DC | PRN
  Administered 2011-12-19: 15 mg via ORAL
  Administered 2011-12-20: 10 mg via ORAL
  Administered 2011-12-20: 5 mg via ORAL
  Filled 2011-12-19: qty 3
  Filled 2011-12-19: qty 1
  Filled 2011-12-19: qty 2

## 2011-12-19 MED ORDER — SODIUM CHLORIDE 0.9 % IJ SOLN: 10.0000 mL | Freq: Two times a day (BID) | INTRAMUSCULAR | Status: DC

## 2011-12-19 MED ORDER — POTASSIUM CHLORIDE 10 MEQ/100ML IV SOLN
10.0000 meq | INTRAVENOUS | Status: AC
Start: 2011-12-19 — End: 2011-12-19
  Administered 2011-12-19 (×2): 10 meq via INTRAVENOUS
  Filled 2011-12-19 (×2): qty 100

## 2011-12-19 MED ORDER — GABAPENTIN 300 MG PO CAPS
300.0000 mg | ORAL_CAPSULE | Freq: Every day | ORAL | Status: DC
  Administered 2011-12-19: 300 mg via ORAL
  Filled 2011-12-19 (×2): qty 1

## 2011-12-19 MED ORDER — PROPRANOLOL HCL 20 MG PO TABS
20.0000 mg | ORAL_TABLET | Freq: Every day | ORAL | Status: DC
  Administered 2011-12-20: 20 mg via ORAL
  Filled 2011-12-19: qty 1

## 2011-12-19 MED ORDER — HEPARIN SODIUM (PORCINE) 5000 UNIT/ML IJ SOLN
5000.0000 [IU] | Freq: Three times a day (TID) | INTRAMUSCULAR | Status: DC
  Administered 2011-12-19 – 2011-12-20 (×3): 5000 [IU] via SUBCUTANEOUS
  Filled 2011-12-19 (×5): qty 1

## 2011-12-19 MED ORDER — POTASSIUM CHLORIDE 10 MEQ/100ML IV SOLN
10.0000 meq | Freq: Once | INTRAVENOUS | Status: AC
  Administered 2011-12-19: 10 meq via INTRAVENOUS
  Filled 2011-12-19: qty 100

## 2011-12-19 MED ORDER — LEVOFLOXACIN IN D5W 500 MG/100ML IV SOLN
500.0000 mg | INTRAVENOUS | Status: DC
  Administered 2011-12-19 – 2011-12-20 (×2): 500 mg via INTRAVENOUS
  Filled 2011-12-19 (×3): qty 100

## 2011-12-19 MED ORDER — SODIUM CHLORIDE 0.9 % IJ SOLN
10.0000 mL | INTRAMUSCULAR | Status: DC | PRN
  Administered 2011-12-19 – 2011-12-20 (×3): 10 mL

## 2011-12-19 MED ORDER — TRIAMTERENE-HCTZ 37.5-25 MG PO TABS
0.5000 | ORAL_TABLET | Freq: Every day | ORAL | Status: DC
  Filled 2011-12-19: qty 0.5

## 2011-12-19 MED ORDER — ACETAMINOPHEN 325 MG PO TABS: 650.0000 mg | ORAL_TABLET | Freq: Four times a day (QID) | ORAL | Status: DC | PRN

## 2011-12-19 MED ORDER — DIAZEPAM 5 MG PO TABS
5.0000 mg | ORAL_TABLET | Freq: Three times a day (TID) | ORAL | Status: DC | PRN
  Administered 2011-12-20 (×2): 5 mg via ORAL
  Filled 2011-12-19 (×2): qty 1

## 2011-12-19 MED ORDER — POTASSIUM CHLORIDE CRYS ER 20 MEQ PO TBCR
40.0000 meq | EXTENDED_RELEASE_TABLET | Freq: Once | ORAL | Status: AC
  Administered 2011-12-19: 40 meq via ORAL
  Filled 2011-12-19: qty 2

## 2011-12-19 MED ORDER — LEVOTHYROXINE SODIUM 100 MCG PO TABS
100.0000 ug | ORAL_TABLET | Freq: Every day | ORAL | Status: DC
  Administered 2011-12-20: 100 ug via ORAL
  Filled 2011-12-19 (×2): qty 1

## 2011-12-19 MED ORDER — LATANOPROST 0.005 % OP SOLN
1.0000 [drp] | Freq: Every day | OPHTHALMIC | Status: DC
  Administered 2011-12-19: 1 [drp] via OPHTHALMIC
  Filled 2011-12-19: qty 2.5

## 2011-12-19 MED ORDER — OXYCODONE HCL 5 MG PO TABS: 15.0000 mg | ORAL_TABLET | Freq: Four times a day (QID) | ORAL | Status: DC | PRN

## 2011-12-19 MED ORDER — FAMOTIDINE 20 MG PO TABS
20.0000 mg | ORAL_TABLET | Freq: Every day | ORAL | Status: DC
  Administered 2011-12-19 – 2011-12-20 (×2): 20 mg via ORAL
  Filled 2011-12-19 (×2): qty 1

## 2011-12-19 MED ORDER — HYDROXYZINE HCL 10 MG PO TABS
10.0000 mg | ORAL_TABLET | Freq: Three times a day (TID) | ORAL | Status: DC | PRN
  Administered 2011-12-19: 10 mg via ORAL
  Filled 2011-12-19: qty 1

## 2011-12-19 MED ORDER — ACETAMINOPHEN 650 MG RE SUPP: 650.0000 mg | Freq: Four times a day (QID) | RECTAL | Status: DC | PRN

## 2011-12-19 NOTE — ED Notes (Signed)
MD at bedside. 

## 2011-12-19 NOTE — ED Notes (Signed)
Pt has been on abx for one month for redness and swelling to right leg.  Pt started having bad reaction to vancomycin.  They changed her anbx yesterday and woke up with face and eyes itching.  Md want her to come in because her potassium level is low too

## 2011-12-19 NOTE — ED Notes (Signed)
Patient transported to X-ray 

## 2011-12-19 NOTE — H&P (Signed)
Family Practice Teaching Service Pager: [phone]  Chief Complaint: allergic reaction, swelling in legs HPI: [name] is an 78 y.o. Female with PMH significant for HTN and complications s/p right knee replacement.  Patient reports issues with swelling in her R knee in 03/2011, but recently had an infection 11/05/11 which required surgery, local infection control with antibiotic pellets (tibial poly exchange and I&D done 11/10/11), and IV antibiotics via PICC for Group B Strep bacteremia.  She was receiving IV vancomycin (patient with PCN allergy of swelling) until last week (4/10) when she developed a lacy, puritic rash all over her body; she had completed 4 weeks of her 6 week course.  She feels like this rash is resolving and is significantly better, although still present.  She was seen by ID and changed to IV daptomycin yesterday (4/16), which she received her first dose of yesterday at Short Stay.  Initially, she tolerated this medication well, however she woke up this morning at 4:30am with swollen eye lids and an itchy face.  She does not think that she had a new rash on her face, and did not have any erythema over her eye lids, only swelling.  She took hydroxyzine, which seemed to help the itching and swelling.  She spoke with her ID office who suggested she come to the ED, not only for this allergic reaction, but also because she has been having significant LE edema, a 20# weight gain in 2-3 weeks despite initiation of lasix therapy, and was found to have hypokalemia on her labs drawn at short stay yesterday (K of 3.2).  Her lasix dose was increased from 20mg  BID (which was started last Friday, 4/12) to 40mg  BID when she was seen by ID yesterday.  She denies any history of heart failure, although did report a history of needing lasix one other time for fluid overload.     Past Medical History  Diagnosis Date  . Angina   . Headache   . Hypoglycemia   . Fracture 2008    back  . Macular  degeneration of both eyes   . Glaucoma   . Pneumonia   . Shortness of breath 11/08/11    "here lately, all the time"  . Hypothyroidism   . Blood transfusion   . Anemia   . GERD (gastroesophageal reflux disease)   . H/O hiatal hernia   . Arthritis   . Chronic back pain   . Thyroid cancer 1986    Past Surgical History  Procedure Date  . Thyroidectomy   . Shoulder arthroscopy w/ rotator cuff repair   . Trigger finger release     right  thumb  . Abdominal hysterectomy   . Carpal tunnel release     bilaterally  . Tonsillectomy and adenoidectomy   . Appendectomy   . Dilation and curettage of uterus   . Cataract extraction w/ intraocular lens  implant, bilateral   . Joint replacement 2005, 2006, 2012, 11/10/11    left; left; right; right; right  . Goiter removed     "when I was young"  . Fracture surgery     "broke back"  . Back surgery     "3 back OR's"  . Hand or 09/2011    "left hand; bones were coming together"  . I&d knee with poly exchange 11/10/2011    Procedure: IRRIGATION AND DEBRIDEMENT KNEE WITH POLY EXCHANGE;  Surgeon: Thera Flake., MD;  Location: [facility] OR;  Service: Orthopedics;  Laterality: Right;  .  Post polyethelene exchange 11/09/11    RLE    History reviewed. No pertinent family history. Social History:  reports that she has never smoked. She has never used smokeless tobacco. She reports that she does not drink alcohol or use illicit drugs.  Allergies:  Allergies  Allergen Reactions  . Ativan Anxiety  . Demerol Shortness Of Breath  . Penicillins Swelling    "in the face"  . Tetracyclines & Related Rash  . Vancomycin Rash    Medications Prior to Admission  Medication Dose Route Frequency Provider Last Rate Last Dose  . DAPTOmycin (CUBICIN) 395 mg in sodium chloride 0.9 % IVPB  6 mg/kg (Order-Specific) Intravenous Once Judyann Munson, MD   395 mg at 12/18/11 1301  . potassium chloride 10 mEq in 100 mL IVPB  10 mEq Intravenous Once Thomasene Lot, PA-C    10 mEq at 12/19/11 1552  . potassium chloride SA (K-DUR,KLOR-CON) CR tablet 40 mEq  40 mEq Oral Once Thomasene Lot, PA-C   40 mEq at 12/19/11 1546  . DISCONTD: heparin lock flush 100 unit/mL  500 Units Intravenous Once Judyann Munson, MD       Medications Prior to Admission  Medication Sig Dispense Refill  . diazepam (VALIUM) 5 MG tablet Take 1 tablet by mouth At bedtime as needed. For sleep      . dorzolamide-timolol (COSOPT) 22.3-6.8 MG/ML ophthalmic solution Place 1 drop into both eyes 2 (two) times daily.        . furosemide (LASIX) 20 MG tablet Take 40 mg by mouth 2 (two) times daily.      Marland Kitchen gabapentin (NEURONTIN) 300 MG capsule Take 300 mg by mouth at bedtime.        . hydrocortisone cream 1 % Apply to affected area 2 times daily;  30 g  1  . hydrOXYzine (ATARAX/VISTARIL) 10 MG tablet Take 1 tablet (10 mg total) by mouth 3 (three) times daily as needed for itching.  60 tablet  1  . latanoprost (XALATAN) 0.005 % ophthalmic solution Place 1 drop into both eyes at bedtime.        Marland Kitchen levothyroxine (SYNTHROID, LEVOTHROID) 100 MCG tablet Take 100 mcg by mouth daily.        Marland Kitchen oxyCODONE (ROXICODONE) 15 MG immediate release tablet Take 1 tablet by mouth Every 6 hours as needed. For pain      . propranolol (INDERAL) 20 MG tablet Take 20 mg by mouth daily. Takes for migraines.      Marland Kitchen DISCONTD: furosemide (LASIX) 20 MG tablet Take 1 tablet (20 mg total) by mouth daily.  30 tablet  11    Results for orders placed during the hospital encounter of 12/19/11 (from the past 48 hour(s))  BASIC METABOLIC PANEL     Status: Abnormal   Collection Time   12/19/11  1:41 PM      Component Value Range Comment   Sodium 134 (*) 135 - 145 (mEq/L)    Potassium 2.4 (*) 3.5 - 5.1 (mEq/L)    Chloride 94 (*) 96 - 112 (mEq/L)    CO2 30  19 - 32 (mEq/L)    Glucose, Bld 113 (*) 70 - 99 (mg/dL)    BUN 9  6 - 23 (mg/dL)    Creatinine, Ser 1.61  0.50 - 1.10 (mg/dL)    Calcium 7.5 (*) 8.4 - 10.5 (mg/dL)    GFR calc non  Af Amer 59 (*) >90 (mL/min)    GFR calc Af Amer 68 (*) >90 (  mL/min)   CBC     Status: Abnormal   Collection Time   12/19/11  1:41 PM      Component Value Range Comment   WBC 8.8  4.0 - 10.5 (K/uL)    RBC 3.24 (*) 3.87 - 5.11 (MIL/uL)    Hemoglobin 9.5 (*) 12.0 - 15.0 (g/dL)    HCT 40.9 (*) 81.1 - 46.0 (%)    MCV 89.5  78.0 - 100.0 (fL)    MCH 29.3  26.0 - 34.0 (pg)    MCHC 32.8  30.0 - 36.0 (g/dL)    RDW 91.4 (*) 78.2 - 15.5 (%)    Platelets 277  150 - 400 (K/uL)   DIFFERENTIAL     Status: Abnormal   Collection Time   12/19/11  1:41 PM      Component Value Range Comment   Neutrophils Relative 50  43 - 77 (%)    Lymphocytes Relative 28  12 - 46 (%)    Monocytes Relative 8  3 - 12 (%)    Eosinophils Relative 14 (*) 0 - 5 (%)    Basophils Relative 0  0 - 1 (%)    Neutro Abs 4.4  1.7 - 7.7 (K/uL)    Lymphs Abs 2.5  0.7 - 4.0 (K/uL)    Monocytes Absolute 0.7  0.1 - 1.0 (K/uL)    Eosinophils Absolute 1.2 (*) 0.0 - 0.7 (K/uL)    Basophils Absolute 0.0  0.0 - 0.1 (K/uL)    WBC Morphology ATYPICAL LYMPHOCYTES      Dg Chest 2 View  12/19/2011  *RADIOLOGY REPORT*  Clinical Data: Leg swelling, evaluate for CHF  CHEST - 2 VIEW  Comparison: 11/09/2011  Findings: Lungs are essentially clear. No pleural effusion or pneumothorax.  The heart is normal in size.  Left arm PICC.  Degenerative changes of the visualized thoracolumbar spine.  IMPRESSION: No evidence of acute cardiopulmonary disease.  Original Report Authenticated By: Charline Bills, M.D.    Review of Systems  Constitutional: Negative for fever, chills and weight loss.  HENT: Negative for sore throat.   Eyes: Negative for blurred vision.  Respiratory: Negative for cough, shortness of breath, wheezing and stridor.   Cardiovascular: Positive for leg swelling. Negative for chest pain.  Gastrointestinal: Negative for nausea, vomiting, abdominal pain, diarrhea and constipation.  Musculoskeletal: Positive for back pain and joint pain.    Skin: Positive for itching and rash.  Neurological: Negative for dizziness, focal weakness, weakness and headaches.    Blood pressure 121/49, pulse 77, temperature 98.1 F (36.7 C), temperature source Oral, resp. rate 14, SpO2 100.00%. Physical Exam  Constitutional: She is oriented to person, place, and time. She appears well-developed and well-nourished. No distress.  HENT:  Head: Normocephalic and atraumatic.  Mouth/Throat: No oropharyngeal exudate.  Eyes: Conjunctivae and EOM are normal. Pupils are equal, round, and reactive to light. Right eye exhibits no discharge. Left eye exhibits no discharge.  Neck: Normal range of motion. Neck supple.  Cardiovascular: Normal rate, regular rhythm, normal heart sounds and intact distal pulses.   No murmur heard. Respiratory: Effort normal and breath sounds normal. No stridor. No respiratory distress. She has no wheezes.  GI: Soft. Bowel sounds are normal. She exhibits no distension. There is no tenderness.  Musculoskeletal: She exhibits edema.  Neurological: She is alert and oriented to person, place, and time.  Skin: Skin is warm and dry. Rash noted. Rash is macular. She is not diaphoretic. Nails show no clubbing.  Bilateral upper ext: macular, erythematous, lacy rash over arms and chest Bilateral lower ext: very warm, beefy red rash over BLE with 3-4 small blisters over left lateral calf; 3-4+ bilateral pitting edema      Assessment/Plan 78 yo F with PMH significant for Group B Strep bacteremia 2/2 right knee prosthetic joint infection with recent allergic reactions to vanc ~1 week ago and now dapto presents with facial itching due to drug reaction as well as significant BLE edema.  1. ID/allergic reaction: last dose of abx (dapto) yesterday at short stay.  Has many drug allergies, and she has been a challenging case; has been seeing ID as an outpatient -ID consult called, discussed case with Dr. Synthia Innocent; per ID recs, will switch to po  Levaquin, as pt has a PCN allergy (swelling) but no recorded intolerances to flouroquinolones.  -rashes appear to be improving, no facial swelling, itching has resolved; pt believes they called her reaction to vanc Red Man Syndrome, but since rash is persistent, this is unlikely the only reaction she has to vanco  -continue hydroxyzine and hydrocortisone PRN  2. LE swelling: was on 40 mg po lasix at home, which was increased to 40mg  BID yesterday -will check BNP and ECHO for possible heart failure -beefy red rash most likely venous stasis dermatitis; will monitor; has blisters, some of which are actively weeping  -once K has been repleted, will give lasix 80mg  IV and monitor output overnight with strict Is/Os, daily weights    3. Hypokalemia: likely due to initiation of/ increased lasix dose -received of K in ED (40 po, 10 IV); BMET showing recheck of K to have improved from 2.4 to 2.5 -discussed with pharmacy; patient received po once K of 2.5 was discovered; will also give additional K IV and recheck BMET in ~5-6 hours after po dose.  Will continue giving 40-43mEq po at a time until K >3.5 so that diuresis can be started; would like to try to maximize giving po since every 10 mEq of IV K is in 100cc of NS and patient is not yet being diuresed.  -then, will continuing giving K prophylactically while also giving high dose diuretics   4. HTN: BPs currently stable -continue home triamterene/HCTZ (patient also on propranolol for HAs) -will monitor closely since we will be giving high doses of diuretics for edema   5. Chronic pain: patient reports being followed by pain clinic for chronic back pain s/p "broken back" requiring 3 surgeries -continue home oxycodone   6. Hypothyroidism: 2/2 thyroid removal s/p cancer -continue home synthroid  7. Glaucoma: continue home eye drops  8. FEN/GI: SLIV, minimize IVF; heart healthy/reduced sodium diet  9. Ppx: subQ heaprin for DVT, H2  blocker for GI given recent allergic reactions  10. Dispo: pending clinical improvement, effective diuresis, and potassium repletion   Delsie Amador 12/19/2011, 4:51 PM

## 2011-12-19 NOTE — ED Provider Notes (Signed)
History     CSN: [id number]  Arrival date & time 12/19/11  1205   First MD Initiated Contact with Patient 12/19/11 1305   HPI Patient reports she was advised to come to the ED by her infectious disease doctor do to her rash from vancomycin and low potassium. Patient states she feels fine. Dr. Jerolyn Center, infectious disease M.D. has called me and states that she recommended patient come to the emergency department do to a 20 pound weight gain in the past week. States she recommends diuresis. Reports increased fluid on legs is hampering improvement of bilateral lower Exttremity cellulitis. States patient had a right-sided knee replacement on 11/09/2011. After that developed a cellulitis of her right lower extremity. States symptoms have not improved as she had hoped when she was placed on one month of vancomycin. States she was DC'd off of vancomycin and started on daptomycin which she received one dose of yesterday. States her plan and setting her to the emergency department was for admission.  The history is provided by the patient and a caregiver.    Past Medical History  Diagnosis Date  . Angina   . Headache   . Hypoglycemia   . Fracture 2008    back  . Macular degeneration of both eyes   . Glaucoma   . Pneumonia   . Shortness of breath 11/08/11    "here lately, all the time"  . Hypothyroidism   . Blood transfusion   . Anemia   . GERD (gastroesophageal reflux disease)   . H/O hiatal hernia   . Arthritis   . Chronic back pain   . Thyroid cancer 1986    Past Surgical History  Procedure Date  . Thyroidectomy   . Shoulder arthroscopy w/ rotator cuff repair   . Trigger finger release     right  thumb  . Abdominal hysterectomy   . Carpal tunnel release     bilaterally  . Tonsillectomy and adenoidectomy   . Appendectomy   . Dilation and curettage of uterus   . Cataract extraction w/ intraocular lens  implant, bilateral   . Joint replacement 2005, 2006, 2012, 11/10/11   left; left; right; right; right  . Goiter removed     "when I was young"  . Fracture surgery     "broke back"  . Back surgery     "3 back OR's"  . Hand or 09/2011    "left hand; bones were coming together"  . I&d knee with poly exchange 11/10/2011    Procedure: IRRIGATION AND DEBRIDEMENT KNEE WITH POLY EXCHANGE;  Surgeon: Thera Flake., MD;  Location: [facility] OR;  Service: Orthopedics;  Laterality: Right;    No family history on file.  History  Substance Use Topics  . Smoking status: Never Smoker   . Smokeless tobacco: Never Used  . Alcohol Use: No    OB History    Grav Para Term Preterm Abortions TAB SAB Ect Mult Living                  Review of Systems  Respiratory: Negative for shortness of breath.   Cardiovascular: Negative for chest pain.  Musculoskeletal:       Lower extremity edema, and erythema  All other systems reviewed and are negative.    Allergies  Ativan; Demerol; Penicillins; Tetracyclines & related; and Vancomycin  Home Medications   Current Outpatient Rx  Name Route Sig Dispense Refill  . DIAZEPAM 5 MG PO TABS Oral  Take 1 tablet by mouth At bedtime as needed. For sleep    . DORZOLAMIDE HCL-TIMOLOL MAL 22.3-6.8 MG/ML OP SOLN Both Eyes Place 1 drop into both eyes 2 (two) times daily.      . FUROSEMIDE 20 MG PO TABS Oral Take 40 mg by mouth 2 (two) times daily.    Marland Kitchen GABAPENTIN 300 MG PO CAPS Oral Take 300 mg by mouth at bedtime.      Marland Kitchen HYDROCORTISONE 1 % EX CREA  Apply to affected area 2 times daily; 30 g 1  . HYDROXYZINE HCL 10 MG PO TABS Oral Take 1 tablet (10 mg total) by mouth 3 (three) times daily as needed for itching. 60 tablet 1  . LATANOPROST 0.005 % OP SOLN Both Eyes Place 1 drop into both eyes at bedtime.      Marland Kitchen LEVOTHYROXINE SODIUM 100 MCG PO TABS Oral Take 100 mcg by mouth daily.      . OXYCODONE HCL 15 MG PO TABS Oral Take 1 tablet by mouth Every 6 hours as needed. For pain    . PROPRANOLOL HCL 20 MG PO TABS Oral Take 20 mg by mouth daily.  Takes for migraines.    . TRIAMTERENE-HCTZ 37.5-25 MG PO TABS Oral Take 0.5 tablets by mouth daily.      BP 121/49  Pulse 77  Temp(Src) 98.1 F (36.7 C) (Oral)  Resp 14  SpO2 100%  Physical Exam  Vitals reviewed. Constitutional: She is oriented to person, place, and time. Vital signs are normal. She appears well-developed and well-nourished.  HENT:  Head: Normocephalic and atraumatic.  Eyes: Conjunctivae are normal. Pupils are equal, round, and reactive to light.  Neck: Normal range of motion. Neck supple.  Cardiovascular: Normal rate, regular rhythm and normal heart sounds.  Exam reveals no friction rub.   No murmur heard. Pulmonary/Chest: Effort normal and breath sounds normal. She has no wheezes. She has no rhonchi. She has no rales. She exhibits no tenderness.  Musculoskeletal: Normal range of motion.       Bilateral lower extremity insufficiency. +2 pitting edema. Erythema up entire lower extremities. no ulcerations or wounds  Neurological: She is alert and oriented to person, place, and time. Coordination normal.  Skin: Skin is warm and dry. No rash noted. No erythema. No pallor.    ED Course  Procedures  Results for orders placed during the hospital encounter of 12/19/11  BASIC METABOLIC PANEL      Component Value Range   Sodium 134 (*) 135 - 145 (mEq/L)   Potassium 2.4 (*) 3.5 - 5.1 (mEq/L)   Chloride 94 (*) 96 - 112 (mEq/L)   CO2 30  19 - 32 (mEq/L)   Glucose, Bld 113 (*) 70 - 99 (mg/dL)   BUN 9  6 - 23 (mg/dL)   Creatinine, Ser 4.09  0.50 - 1.10 (mg/dL)   Calcium 7.5 (*) 8.4 - 10.5 (mg/dL)   GFR calc non Af Amer 59 (*) >90 (mL/min)   GFR calc Af Amer 68 (*) >90 (mL/min)  CBC      Component Value Range   WBC 8.8  4.0 - 10.5 (K/uL)   RBC 3.24 (*) 3.87 - 5.11 (MIL/uL)   Hemoglobin 9.5 (*) 12.0 - 15.0 (g/dL)   HCT 81.1 (*) 91.4 - 46.0 (%)   MCV 89.5  78.0 - 100.0 (fL)   MCH 29.3  26.0 - 34.0 (pg)   MCHC 32.8  30.0 - 36.0 (g/dL)   RDW 78.2 (*) 95.6 -  15.5 (%)     Platelets 277  150 - 400 (K/uL)  DIFFERENTIAL      Component Value Range   Neutrophils Relative 50  43 - 77 (%)   Lymphocytes Relative 28  12 - 46 (%)   Monocytes Relative 8  3 - 12 (%)   Eosinophils Relative 14 (*) 0 - 5 (%)   Basophils Relative 0  0 - 1 (%)   Neutro Abs 4.4  1.7 - 7.7 (K/uL)   Lymphs Abs 2.5  0.7 - 4.0 (K/uL)   Monocytes Absolute 0.7  0.1 - 1.0 (K/uL)   Eosinophils Absolute 1.2 (*) 0.0 - 0.7 (K/uL)   Basophils Absolute 0.0  0.0 - 0.1 (K/uL)   WBC Morphology ATYPICAL LYMPHOCYTES     Dg Chest 2 View  12/19/2011  *RADIOLOGY REPORT*  Clinical Data: Leg swelling, evaluate for CHF  CHEST - 2 VIEW  Comparison: 11/09/2011  Findings: Lungs are essentially clear. No pleural effusion or pneumothorax.  The heart is normal in size.  Left arm PICC.  Degenerative changes of the visualized thoracolumbar spine.  IMPRESSION: No evidence of acute cardiopulmonary disease.  Original Report Authenticated By: Charline Bills, M.D.   US Venous Img Lower Unilateral Right  12/06/2011  *RADIOLOGY REPORT*  Clinical Data: RIGHT LEG PAIN, SWELLING; ? DVT;;  RIGHT LOWER EXTREMITY VENOUS DUPLEX ULTRASOUND  Technique: Gray-scale sonography with compression, as well as color and duplex ultrasound, were performed to evaluate the deep venous system from the level of the common femoral vein through the popliteal and proximal calf veins.  Comparison: None  Findings:  Normal compressibility and normal Doppler signal within the common femoral, superficial femoral and popliteal veins, down to the proximal calf veins.  No grayscale filling defects to suggest DVT.  Mildly prominent right thigh/inguinal lymph nodes.  Recommend clinical follow-up.  IMPRESSION: No evidence of right lower extremity deep vein thrombosis.  Prominent right inguinal lymph nodes.  Recommend clinical follow- up.  Original Report Authenticated By: Cyndie Chime, M.D.    MDM   Discussed labs with patient and advised that Dr. Ilsa Iha wanted  patient admitted for hypokalemia and peripheral edema. Spoke with family practice. They will come see the patient for admission.      Thomasene Lot, PA-C 12/19/11 1612

## 2011-12-19 NOTE — ED Notes (Signed)
Hold potassium PO until BMP returns. If BMP <4.0 give PO Potassium.

## 2011-12-19 NOTE — Progress Notes (Signed)
CRITICAL VALUE ALERT  Critical value received:  Potassium 2.5  Date of notification:  12/19/11  Time of notification:  1830  Critical value read back:yes  Nurse who received alert:  Mervin Hack rn  MD notified (1st page):dr mcgill    Time of first page:  1845  MD notified (2nd page):  Time of second page:  Responding MD:  mcgill  Time MD responded: [phone]

## 2011-12-19 NOTE — Telephone Encounter (Signed)
Called patient to follow up with her following her IV antibiotic change on yesterday. She advised that it went fine while she was at the short stay. However she awoke this morning at 4 am to swollen eyes, a rash on her face and itchy swollen hands that had a burning sensation. She took 2, 10 mg hydroxyzine tabs and still is very itchy. Advised her will call provider to see what she would like for patient to do and call her back.  After speaking with provider who was trying to get patient also. She advised patient to go to the ED to be evaulated. Also advise her that the labs drawn yesterday showed that her potassium is low. Called the patient and advised her to go to the ED as soon as she can and told her that her potassium is low. She communicated that she understands and will go now.

## 2011-12-19 NOTE — H&P (Signed)
[facility] Attending Admission Note: Sheila Levy MD [phone] pager office [phone] I  have seen and examined this patient this afternoon in the ED, reviewed their chart. I have discussed this patient with the residents, Drs Physiological scientist and [place]. I agree with the Dr. Myrla Halsted findings, assessment and care plan. Appreciate ID consult via phone and they will see her in the AM. She is stable. Agree with repletion of potassium before we start significant dieresis. Agree with telemetry tonight given  Her hypokalemia. Will try to get wedge for lower extremity elevation as her LE are fairly tight with fluid.

## 2011-12-19 NOTE — ED Notes (Signed)
Potassium 2.4 per Lab Tech.

## 2011-12-20 ENCOUNTER — Telehealth: Payer: Self-pay | Admitting: *Deleted

## 2011-12-20 ENCOUNTER — Encounter (HOSPITAL_COMMUNITY): Payer: Self-pay | Admitting: Family Medicine

## 2011-12-20 DIAGNOSIS — I369 Nonrheumatic tricuspid valve disorder, unspecified: Secondary | ICD-10-CM

## 2011-12-20 LAB — BASIC METABOLIC PANEL
BUN: 8 mg/dL (ref 6–23)
CO2: 32 mEq/L (ref 19–32)
Chloride: 99 mEq/L (ref 96–112)
Creatinine, Ser: 0.85 mg/dL (ref 0.50–1.10)
GFR calc Af Amer: 71 mL/min — ABNORMAL LOW (ref >90)
Glucose, Bld: 101 mg/dL — ABNORMAL HIGH (ref 70–99)

## 2011-12-20 LAB — URINE MICROSCOPIC-ADD ON

## 2011-12-20 LAB — URINALYSIS, ROUTINE W REFLEX MICROSCOPIC
Glucose, UA: NEGATIVE mg/dL
Ketones, ur: NEGATIVE mg/dL
Nitrite: NEGATIVE
Protein, ur: NEGATIVE mg/dL
Urobilinogen, UA: 0.2 mg/dL (ref 0.0–1.0)

## 2011-12-20 LAB — MAGNESIUM: Magnesium: 1.5 mg/dL (ref 1.5–2.5)

## 2011-12-20 LAB — PHOSPHORUS: Phosphorus: 3 mg/dL (ref 2.3–4.6)

## 2011-12-20 MED ORDER — LEVOFLOXACIN 500 MG PO TABS: 500.0000 mg | ORAL_TABLET | Freq: Every day | ORAL | Status: AC

## 2011-12-20 MED ORDER — POTASSIUM CHLORIDE CRYS ER 20 MEQ PO TBCR: 40.0000 meq | EXTENDED_RELEASE_TABLET | Freq: Two times a day (BID) | ORAL | Status: DC

## 2011-12-20 MED ORDER — FUROSEMIDE 10 MG/ML IJ SOLN
80.0000 mg | Freq: Once | INTRAMUSCULAR | Status: AC
  Administered 2011-12-20: 80 mg via INTRAVENOUS
  Filled 2011-12-20: qty 8

## 2011-12-20 MED ORDER — HEPARIN SOD (PORK) LOCK FLUSH 100 UNIT/ML IV SOLN
250.0000 [IU] | INTRAVENOUS | Status: AC | PRN
  Administered 2011-12-20: 500 [IU]

## 2011-12-20 MED ORDER — TORSEMIDE 20 MG PO TABS: 20.0000 mg | ORAL_TABLET | Freq: Two times a day (BID) | ORAL | Status: DC

## 2011-12-20 MED ORDER — TORSEMIDE 20 MG PO TABS: 40.0000 mg | ORAL_TABLET | Freq: Every day | ORAL | Status: DC

## 2011-12-20 MED ORDER — POTASSIUM CHLORIDE CRYS ER 20 MEQ PO TBCR
60.0000 meq | EXTENDED_RELEASE_TABLET | Freq: Once | ORAL | Status: AC
  Administered 2011-12-20: 60 meq via ORAL
  Filled 2011-12-20: qty 3

## 2011-12-20 MED ORDER — POTASSIUM CHLORIDE CRYS ER 20 MEQ PO TBCR
40.0000 meq | EXTENDED_RELEASE_TABLET | Freq: Once | ORAL | Status: DC
  Filled 2011-12-20: qty 2

## 2011-12-20 MED ORDER — MAGNESIUM SULFATE 40 MG/ML IJ SOLN
2.0000 g | Freq: Once | INTRAMUSCULAR | Status: AC
  Administered 2011-12-20: 2 g via INTRAVENOUS
  Filled 2011-12-20: qty 50

## 2011-12-20 NOTE — Telephone Encounter (Signed)
Dr. Dessa Phi called to let md here know that she reacted to the dapsone & is now on levaquin. Wanted to know if we set that up with home care rns. I gave this message to Dr. Drue Second. She wants to speak with this md. I obtained her pager number [phone] & entered Dr. Pearson Grippe number [phone]

## 2011-12-20 NOTE — Discharge Summary (Signed)
I have reviewed this discharge summary and agree.    

## 2011-12-20 NOTE — Progress Notes (Signed)
Daily Progress Note [name]. [name], M.D., M.B.A  Family Medicine PGY-1 Pager [phone]  Subjective: Mild pain in lower exteremities; no SOB; would like to go home  Objective: Vital signs in last 24 hours: Temp:  [98 F (36.7 C)-98.2 F (36.8 C)] 98.1 F (36.7 C) (04/18 0603) Pulse Rate:  [71-81] 71  (04/18 0603) Resp:  [14-20] 18  (04/18 0603) BP: (99-131)/(46-61) 109/46 mmHg (04/18 0603) SpO2:  [92 %-100 %] 99 % (04/18 0603) Weight:  [160 lb (72.576 kg)-160 lb 0.9 oz (72.6 kg)] 160 lb 0.9 oz (72.6 kg) (04/17 2115) Weight change:  Last BM Date: 12/19/11  Intake/Output from previous day: 04/17 0701 - 04/18 0700 In: 708 [P.O.:360; I.V.:240; IV Piggyback:108] Out: 3050 [Urine:3050] Intake/Output this shift:    Gen: alert, oriented, very pleasant HEENT: NCAT, OP clear and moist, EOMI, PERRLA Lungs: CTA-B Cardiac: RRR Extremities: venous stasis dermatitis bilaterally, improving from admission  Lab Results:  [facility] 12/19/11 1341 12/18/11 1111  WBC 8.8 8.3  HGB 9.5* 10.0*  HCT 29.0* 32.9*  PLT 277 334   BMET  Basename 12/20/11 0520 12/19/11 2315  NA 139 135  K 3.2* 3.8  CL 99 97  CO2 32 31  GLUCOSE 101* 116*  BUN 8 9  CREATININE 0.85 0.84  CALCIUM 7.5* 7.2*   Mag: 1.7  Studies/Results: Dg Chest 2 View  12/19/2011  *RADIOLOGY REPORT*  Clinical Data: Leg swelling, evaluate for CHF  CHEST - 2 VIEW  Comparison: 11/09/2011  Findings: Lungs are essentially clear. No pleural effusion or pneumothorax.  The heart is normal in size.  Left arm PICC.  Degenerative changes of the visualized thoracolumbar spine.  IMPRESSION: No evidence of acute cardiopulmonary disease.  Original Report Authenticated By: Charline Bills, M.D.    Medications: I have reviewed the patient's current medications.  Assessment/Plan: 78 yo F with PMH significant for Group B Strep bacteremia 2/2 right knee prosthetic joint infection with recent allergic reactions to vanc ~1 week ago and now  dapto presents with facial itching due to drug reaction as well as significant BLE edema.  1. ID - Levaquin 500 mg IV daily unless directed otherwise by ID  2. BLE - likely venous stasis; checking Echo to r/o CHF, but not likely  - add torsemide after 1:00 PM BMET if K+ repleted  3. Hypokalemia - likely secondary to loop diuretics, also low mag  - replete mag  - replete K+ orally  - order urinalysis to look for evidence of nephritis  4. HTN: BPs currently stable  -continue home triamterene/HCTZ (patient also on propranolol for HAs)  -will monitor closely since we will be giving high doses of diuretics for edema   5. Chronic pain: patient reports being followed by pain clinic for chronic back pain s/p "broken back" requiring 3 surgeries  -continue home oxycodone   6. Hypothyroidism: 2/2 thyroid removal s/p cancer  -continue home synthroid   7. Glaucoma: continue home eye drops   8. FEN/GI: SLIV, minimize IVF; heart healthy/reduced sodium diet   9. Ppx: subQ heaprin for DVT, H2 blocker for GI given recent allergic reactions   10. Dispo: pending clinical improvement, effective diuresis, and potassium repletion     LOS: 1 day   Mat Carne 12/20/2011, 8:41 AM

## 2011-12-20 NOTE — Progress Notes (Signed)
DC instructions provided. Foley removed. picc line hep locked by iv team. Pt under no s/s distress.

## 2011-12-20 NOTE — Progress Notes (Signed)
I stopped in to check up on the patient. She had a total abdominal hysterectomy in 1969. She is ready to go home. There was a delay in giving her potassium. She will receive 60 mEq by mouth now. UA is positive for trace LE and small blood otherwise negative. She received IV Levaquin for her recent GBS sepsis. Plan is to discharge to home on Torsemide 20 mg BID and KDUR 40 mEq BID with meals. With outpatient follow up with ID and her PCP.   Nanie Dunkleberger 12/20/11; 3:19 PM

## 2011-12-20 NOTE — Progress Notes (Signed)
Spoke with MD on call for family medicine. Pt to go home with picc. Order placed.

## 2011-12-20 NOTE — Discharge Instructions (Signed)
Mrs. Myron:  Please do the following: 1. Take torsemide 40 mg daily for swelling. Keep legs elevated and use your compression device.  2. Take potassium 40 mEq twice daily.  3. For antibiotic coverage: you are now taking Levaquin 500 mg by mouth daily for 12 more days. Dr. Drue Second at the ID clinic will arrange for [facility] to remove your PICC line early next week.   Dr. Armen Pickup

## 2011-12-20 NOTE — Discharge Summary (Signed)
Physician Discharge Summary  Patient ID: [name] MRN: [id number] DOB/AGE: 07-Dec-1924 78 y.o.  Admit date: 12/19/2011 Discharge date: 12/20/2011  Admission Diagnoses: Hypokalemia  Discharge Diagnoses:  Active Problems:  Hypertension  Drug rash  Prosthetic joint infection  GBS (group B streptococcus) infection  Bilateral lower extremity edema   Discharged Condition: stable  Hospital Course: [name] is an 78 y.o. Female with PMH significant for HTN and complications s/p right knee replacement. Patient reports issues with swelling in her R knee in 03/2011, but recently had an infection 11/05/11 which required surgery, local infection control with antibiotic pellets (tibial poly exchange and I&D done 11/10/11), and IV antibiotics via PICC for Group B Strep bacteremia. She was receiving IV vancomycin (patient with PCN allergy of swelling) until last week (4/10) when she developed a lacy, puritic rash all over her body; she had completed 4 weeks of her 6 week course. She feels like this rash is resolving and is significantly better, although still present. She was seen by ID and changed to IV daptomycin on 4/16, which she received her first dose of at Short Stay. Initially, she tolerated this medication well, however she woke up at 4:30am on day of admission with swollen eye lids and an itchy face. She does not think that she had a new rash on her face, and did not have any erythema over her eye lids, only swelling. She took hydroxyzine, which seemed to help the itching and swelling. She spoke with her ID office who suggested she come to the ED, not only for this allergic reaction, but also because she has been having significant LE edema, a 20# weight gain in 2-3 weeks despite initiation of lasix therapy, and was found to have hypokalemia on her labs drawn at short stay yesterday (K of 3.2).  During admission, patient's potassium was repleted with both IV and oral K+. She had good response  and her potassium was stable at discharge. For antibiotic coverage given her extensive history, ID was contacted at admission. She has multiple antibiotic allergies, including recent reaction to Vanc and Dapto. Therefore she was started on Levaquin 500mg  PO daily. She still needs two weeks of coverage. Dr. Drue Second was contacted once again for discharge planning, and recommend she continue on Levaquin 500mg  daily to complete her course. She will follow up with ID clinic as scheduled for this. She also had significant lower extremity edema. She has been on Lasix at home, but was given Lasix IV and had excellent response with 3L diuresis. Patient will be sent home on Torsemide 40mg  daily. Would recommend to continue to monitor potassium levels as an outpatient.  Patient has an appointment scheduled to establish a PCP at [facility] tomorrow. She will keep this appointment, and then follow up with them again on May 1. Patient stable at discharge.  Recommendations for follow up: -Monitor weights and make adjustments to Torsemide as needed -Monitor potassium levels -Monitor for drug reaction, as well as appropriate response with change in antibiotics.  Consults: ID  Significant Diagnostic Studies:    Ref. Range 12/18/2011 11:11 12/19/2011 13:41 12/19/2011 15:23 12/19/2011 17:19 12/19/2011 23:15 12/20/2011 05:20  Potassium 3.5-5.1 mEq/L 3.2 (L) 2.4 (LL)  2.5 (LL) 3.8 3.2 (L)   CBC    Component Value Date/Time   WBC 8.8 12/19/2011 1341   RBC 3.24* 12/19/2011 1341   HGB 9.5* 12/19/2011 1341   HCT 29.0* 12/19/2011 1341   PLT 277 12/19/2011 1341   MCV 89.5 12/19/2011  1341   MCH 29.3 12/19/2011 1341   MCHC 32.8 12/19/2011 1341   RDW 15.6* 12/19/2011 1341   LYMPHSABS 2.5 12/19/2011 1341   MONOABS 0.7 12/19/2011 1341   EOSABS 1.2* 12/19/2011 1341   BASOSABS 0.0 12/19/2011 1341    Dg Chest 2 View  12/19/2011  *RADIOLOGY REPORT*  Clinical Data: Leg swelling, evaluate for CHF  CHEST - 2 VIEW  Comparison: 11/09/2011   Findings: Lungs are essentially clear. No pleural effusion or pneumothorax.  The heart is normal in size.  Left arm PICC.  Degenerative changes of the visualized thoracolumbar spine.  IMPRESSION: No evidence of acute cardiopulmonary disease.  Original Report Authenticated By: Charline Bills, M.D.   Treatments: antibiotics: Levaquin and diuresis: Lasix IV and Torsemide  Discharge Exam: Blood pressure 91/44, pulse 65, temperature 97.9 F (36.6 C), temperature source Oral, resp. rate 20, height 5\' 4"  (1.626 m), weight 160 lb 0.9 oz (72.6 kg), SpO2 100.00%.  Gen: Well-appearing. Alert, oriented, very pleasant  HEENT: NCAT, OP clear and moist, EOMI, PERRLA  Lungs: CTA-B  Cardiac: RRR  Extremities: venous stasis dermatitis bilaterally with redness to 1" below the knee, improving from admission. Area is non-pruritic.  2+ pitting edema of lower extremities bilaterally  Disposition: 01-Home or Self Care  Discharge Orders    Future Orders Please Complete By Expires   Diet - low sodium heart healthy      Increase activity slowly        Medication List  As of 12/20/2011  4:30 PM   STOP taking these medications         furosemide 20 MG tablet         TAKE these medications         diazepam 5 MG tablet   Commonly known as: VALIUM   Take 1 tablet by mouth At bedtime as needed. For sleep      dorzolamide-timolol 22.3-6.8 MG/ML ophthalmic solution   Commonly known as: COSOPT   Place 1 drop into both eyes 2 (two) times daily.      gabapentin 300 MG capsule   Commonly known as: NEURONTIN   Take 300 mg by mouth at bedtime.      hydrocortisone cream 1 %   Apply to affected area 2 times daily;      hydrOXYzine 10 MG tablet   Commonly known as: ATARAX/VISTARIL   Take 1 tablet (10 mg total) by mouth 3 (three) times daily as needed for itching.      latanoprost 0.005 % ophthalmic solution   Commonly known as: XALATAN   Place 1 drop into both eyes at bedtime.      levofloxacin 500 MG  tablet   Commonly known as: LEVAQUIN   Take 1 tablet (500 mg total) by mouth daily.      levothyroxine 100 MCG tablet   Commonly known as: SYNTHROID, LEVOTHROID   Take 100 mcg by mouth daily.      oxyCODONE 15 MG immediate release tablet   Commonly known as: ROXICODONE   Take 1 tablet by mouth Every 6 hours as needed. For pain      potassium chloride SA 20 MEQ tablet   Commonly known as: K-DUR,KLOR-CON   Take 2 tablets (40 mEq total) by mouth 2 (two) times daily.      propranolol 20 MG tablet   Commonly known as: INDERAL   Take 20 mg by mouth daily. Takes for migraines.      torsemide 20 MG tablet  Commonly known as: DEMADEX   Take 2 tablets (40 mg total) by mouth daily.      triamterene-hydrochlorothiazide 37.5-25 MG per tablet   Commonly known as: MAXZIDE-25   Take 0.5 tablets by mouth daily.           Follow-up Information    Call Judyann Munson, MD.   Contact information:   [address] [place] [address] [phone]       Follow up with Jacklynn Barnacle, NP on 12/21/2011. (At 1:30 PM arrive at 1:15 PM.  Please f/u K+ and  LE edema.)    Contact information:   [facility] [address] [place] [address] [phone]       Follow up with Elby Showers, MD on 01/02/2012.   Contact information:   [address] [place] [address] [phone]          Signed: Rodman Pickle 12/20/2011, 4:30 PM

## 2011-12-20 NOTE — Progress Notes (Signed)
Family Medicine Teaching Service Attending Note  I interviewed and examined patient [name] and reviewed their tests and x-rays.  I discussed with Dr. Ree Edman and reviewed their note for today.  I agree with their assessment and plan.     Additionally  Edema Improved Unsure of cause likely post surgical knee replacements Would check UA to look for neprhritis If does not respond well may need pelvic evaluation for possible mass Suggest elevation and compression stockings at home

## 2011-12-20 NOTE — Progress Notes (Signed)
*  PRELIMINARY RESULTS* Echocardiogram 2D Echocardiogram has been performed.  [name] 12/20/2011, 10:39 AM

## 2011-12-20 NOTE — Progress Notes (Signed)
Pt. BP low, MD made aware while she was in rm with pt.. Held morning BP med. Will continue to monitor pt for s/s distress. Currently under no s/s distress.

## 2011-12-21 NOTE — ED Provider Notes (Signed)
Medical screening examination/treatment/procedure(s) were performed by non-physician practitioner and as supervising physician I was immediately available for consultation/collaboration.   Ala Kratz L Tameria Patti, MD 12/21/11 1642 

## 2011-12-23 LAB — AFB CULTURE WITH SMEAR (NOT AT ARMC)

## 2011-12-24 ENCOUNTER — Telehealth: Payer: Self-pay | Admitting: *Deleted

## 2011-12-24 NOTE — Telephone Encounter (Signed)
I spoke with Dr. Drue Second. [facility] may pull the PICC. I called Misty Stanley at [facility] back & gave the order. I called the pt & told her to expect a call & visit from nurse with [facility]. She was pleased

## 2011-12-24 NOTE — Telephone Encounter (Signed)
Pt states she has waited all day for the [facility] nurse to come pull her PICC. She had gotten out of the hospital last Friday & Dr. Armen Pickup spoke with Dr. Drue Second.  I called [facility] [phone] & spoke with Misty Stanley. They have not gotten a resumption of care order. I paged Dr. Drue Second.to find out what meds pt is on & if she is getting the PICC out.

## 2011-12-25 ENCOUNTER — Inpatient Hospital Stay: Payer: Medicare Other | Admitting: Internal Medicine

## 2011-12-25 DIAGNOSIS — Z792 Long term (current) use of antibiotics: Secondary | ICD-10-CM | POA: Diagnosis not present

## 2011-12-25 DIAGNOSIS — M130 Polyarthritis, unspecified: Secondary | ICD-10-CM | POA: Diagnosis not present

## 2011-12-25 DIAGNOSIS — T8450XA Infection and inflammatory reaction due to unspecified internal joint prosthesis, initial encounter: Secondary | ICD-10-CM | POA: Diagnosis not present

## 2011-12-25 DIAGNOSIS — Z452 Encounter for adjustment and management of vascular access device: Secondary | ICD-10-CM | POA: Diagnosis not present

## 2011-12-25 DIAGNOSIS — Z96659 Presence of unspecified artificial knee joint: Secondary | ICD-10-CM | POA: Diagnosis not present

## 2011-12-26 ENCOUNTER — Telehealth: Payer: Self-pay | Admitting: *Deleted

## 2011-12-26 NOTE — Telephone Encounter (Signed)
Nurse with [facility] called to advise she has seen patient and D/C the PICC. She wanted [place] to know that she saw the patient once.

## 2012-01-01 DIAGNOSIS — M5137 Other intervertebral disc degeneration, lumbosacral region: Secondary | ICD-10-CM | POA: Diagnosis not present

## 2012-01-01 DIAGNOSIS — IMO0002 Reserved for concepts with insufficient information to code with codable children: Secondary | ICD-10-CM | POA: Diagnosis not present

## 2012-01-01 DIAGNOSIS — M961 Postlaminectomy syndrome, not elsewhere classified: Secondary | ICD-10-CM | POA: Diagnosis not present

## 2012-01-01 DIAGNOSIS — M129 Arthropathy, unspecified: Secondary | ICD-10-CM | POA: Diagnosis not present

## 2012-01-04 DIAGNOSIS — R609 Edema, unspecified: Secondary | ICD-10-CM | POA: Diagnosis not present

## 2012-01-04 DIAGNOSIS — M8440XA Pathological fracture, unspecified site, initial encounter for fracture: Secondary | ICD-10-CM | POA: Diagnosis not present

## 2012-01-04 DIAGNOSIS — E785 Hyperlipidemia, unspecified: Secondary | ICD-10-CM | POA: Diagnosis not present

## 2012-01-14 ENCOUNTER — Telehealth: Payer: Self-pay | Admitting: *Deleted

## 2012-01-15 ENCOUNTER — Telehealth: Payer: Self-pay | Admitting: *Deleted

## 2012-01-15 NOTE — Telephone Encounter (Signed)
Called Dr Claris Che office and had to leave a message so will advise Dr Drue Second of this and wait for a return call from Dr Clent Ridges nurse. RN Cathrine called back and advised that Dr Clent Ridges is out of the office today and will be back tomorrow or she can see another provider in the office. Called the patient and advised her of this and she said she perfer to see Dr Clent Ridges. Gave her the number [phone]) to call and get and appt for tomorrow with Dr Clent Ridges.

## 2012-01-15 NOTE — Telephone Encounter (Signed)
Patient called and advised she wants to be seen. She reports that her knee is swollen, red sore and warm to the touch. She advised that she has not had a fever or chills. She reports she can not bend the knee and the skin around the knee is splitting. She is in pain and worried she wants to be seen. After speaking with the patient called the provider and was told to call the patient PCP Dr Clent Ridges and see if they can see her today.

## 2012-01-16 DIAGNOSIS — R609 Edema, unspecified: Secondary | ICD-10-CM | POA: Diagnosis not present

## 2012-01-16 DIAGNOSIS — L0291 Cutaneous abscess, unspecified: Secondary | ICD-10-CM | POA: Diagnosis not present

## 2012-01-16 DIAGNOSIS — E876 Hypokalemia: Secondary | ICD-10-CM | POA: Diagnosis not present

## 2012-01-16 DIAGNOSIS — M25569 Pain in unspecified knee: Secondary | ICD-10-CM | POA: Diagnosis not present

## 2012-01-16 DIAGNOSIS — L0390 Cellulitis, unspecified: Secondary | ICD-10-CM | POA: Diagnosis not present

## 2012-01-16 DIAGNOSIS — D649 Anemia, unspecified: Secondary | ICD-10-CM | POA: Diagnosis not present

## 2012-01-17 ENCOUNTER — Inpatient Hospital Stay (HOSPITAL_COMMUNITY)
Admission: AD | Admit: 2012-01-17 | Discharge: 2012-01-22 | DRG: 464 | Disposition: A | Discharge status: Home Health | Payer: Medicare Other | Source: Ambulatory Visit | Attending: Orthopedic Surgery | Admitting: Orthopedic Surgery

## 2012-01-17 ENCOUNTER — Other Ambulatory Visit: Payer: Self-pay

## 2012-01-17 ENCOUNTER — Encounter: Payer: Self-pay | Admitting: Physician Assistant

## 2012-01-17 ENCOUNTER — Encounter (HOSPITAL_COMMUNITY): Payer: Self-pay | Admitting: General Practice

## 2012-01-17 ENCOUNTER — Inpatient Hospital Stay (HOSPITAL_COMMUNITY): Payer: Medicare Other

## 2012-01-17 ENCOUNTER — Other Ambulatory Visit: Payer: Self-pay | Admitting: Physician Assistant

## 2012-01-17 ENCOUNTER — Encounter (HOSPITAL_COMMUNITY): Payer: Self-pay | Admitting: Pharmacy Technician

## 2012-01-17 DIAGNOSIS — Z85850 Personal history of malignant neoplasm of thyroid: Secondary | ICD-10-CM | POA: Diagnosis not present

## 2012-01-17 DIAGNOSIS — R609 Edema, unspecified: Secondary | ICD-10-CM | POA: Diagnosis present

## 2012-01-17 DIAGNOSIS — M009 Pyogenic arthritis, unspecified: Secondary | ICD-10-CM | POA: Diagnosis present

## 2012-01-17 DIAGNOSIS — T8459XA Infection and inflammatory reaction due to other internal joint prosthesis, initial encounter: Secondary | ICD-10-CM

## 2012-01-17 DIAGNOSIS — K219 Gastro-esophageal reflux disease without esophagitis: Secondary | ICD-10-CM | POA: Diagnosis present

## 2012-01-17 DIAGNOSIS — R600 Localized edema: Secondary | ICD-10-CM | POA: Diagnosis present

## 2012-01-17 DIAGNOSIS — D649 Anemia, unspecified: Secondary | ICD-10-CM | POA: Diagnosis present

## 2012-01-17 DIAGNOSIS — H3530 Unspecified macular degeneration: Secondary | ICD-10-CM | POA: Diagnosis present

## 2012-01-17 DIAGNOSIS — E876 Hypokalemia: Secondary | ICD-10-CM | POA: Diagnosis present

## 2012-01-17 DIAGNOSIS — K5900 Constipation, unspecified: Secondary | ICD-10-CM | POA: Diagnosis not present

## 2012-01-17 DIAGNOSIS — J984 Other disorders of lung: Secondary | ICD-10-CM | POA: Diagnosis not present

## 2012-01-17 DIAGNOSIS — I10 Essential (primary) hypertension: Secondary | ICD-10-CM | POA: Diagnosis present

## 2012-01-17 DIAGNOSIS — I209 Angina pectoris, unspecified: Secondary | ICD-10-CM | POA: Diagnosis not present

## 2012-01-17 DIAGNOSIS — T502X5A Adverse effect of carbonic-anhydrase inhibitors, benzothiadiazides and other diuretics, initial encounter: Secondary | ICD-10-CM | POA: Diagnosis present

## 2012-01-17 DIAGNOSIS — M7989 Other specified soft tissue disorders: Secondary | ICD-10-CM | POA: Diagnosis not present

## 2012-01-17 DIAGNOSIS — H409 Unspecified glaucoma: Secondary | ICD-10-CM | POA: Diagnosis present

## 2012-01-17 DIAGNOSIS — Z96659 Presence of unspecified artificial knee joint: Secondary | ICD-10-CM

## 2012-01-17 DIAGNOSIS — T8450XA Infection and inflammatory reaction due to unspecified internal joint prosthesis, initial encounter: Secondary | ICD-10-CM | POA: Diagnosis not present

## 2012-01-17 DIAGNOSIS — E890 Postprocedural hypothyroidism: Secondary | ICD-10-CM | POA: Diagnosis present

## 2012-01-17 DIAGNOSIS — Z9849 Cataract extraction status, unspecified eye: Secondary | ICD-10-CM | POA: Diagnosis not present

## 2012-01-17 DIAGNOSIS — B951 Streptococcus, group B, as the cause of diseases classified elsewhere: Secondary | ICD-10-CM | POA: Diagnosis present

## 2012-01-17 DIAGNOSIS — R0609 Other forms of dyspnea: Secondary | ICD-10-CM

## 2012-01-17 DIAGNOSIS — T847XXA Infection and inflammatory reaction due to other internal orthopedic prosthetic devices, implants and grafts, initial encounter: Secondary | ICD-10-CM | POA: Diagnosis not present

## 2012-01-17 DIAGNOSIS — I959 Hypotension, unspecified: Secondary | ICD-10-CM | POA: Diagnosis not present

## 2012-01-17 DIAGNOSIS — Z961 Presence of intraocular lens: Secondary | ICD-10-CM

## 2012-01-17 DIAGNOSIS — Y831 Surgical operation with implant of artificial internal device as the cause of abnormal reaction of the patient, or of later complication, without mention of misadventure at the time of the procedure: Secondary | ICD-10-CM | POA: Diagnosis present

## 2012-01-17 DIAGNOSIS — M129 Arthropathy, unspecified: Secondary | ICD-10-CM | POA: Diagnosis present

## 2012-01-17 DIAGNOSIS — M1710 Unilateral primary osteoarthritis, unspecified knee: Secondary | ICD-10-CM | POA: Diagnosis not present

## 2012-01-17 DIAGNOSIS — Z01811 Encounter for preprocedural respiratory examination: Secondary | ICD-10-CM | POA: Diagnosis not present

## 2012-01-17 DIAGNOSIS — D62 Acute posthemorrhagic anemia: Secondary | ICD-10-CM | POA: Diagnosis not present

## 2012-01-17 DIAGNOSIS — G8929 Other chronic pain: Secondary | ICD-10-CM | POA: Diagnosis present

## 2012-01-17 DIAGNOSIS — R112 Nausea with vomiting, unspecified: Secondary | ICD-10-CM | POA: Diagnosis not present

## 2012-01-17 DIAGNOSIS — M549 Dorsalgia, unspecified: Secondary | ICD-10-CM | POA: Diagnosis present

## 2012-01-17 DIAGNOSIS — R0600 Dyspnea, unspecified: Secondary | ICD-10-CM

## 2012-01-17 HISTORY — DX: Osteomyelitis, unspecified: M86.9

## 2012-01-17 HISTORY — DX: Other forms of dyspnea: R06.09

## 2012-01-17 HISTORY — DX: Dyspnea, unspecified: R06.00

## 2012-01-17 LAB — COMPREHENSIVE METABOLIC PANEL
ALT: 10 U/L (ref 0–35)
CO2: 38 mEq/L — ABNORMAL HIGH (ref 19–32)
Calcium: 8.5 mg/dL (ref 8.4–10.5)
Chloride: 85 mEq/L — ABNORMAL LOW (ref 96–112)
Creatinine, Ser: 1 mg/dL (ref 0.50–1.10)
GFR calc Af Amer: 58 mL/min — ABNORMAL LOW (ref >90)
GFR calc non Af Amer: 50 mL/min — ABNORMAL LOW (ref >90)
Glucose, Bld: 128 mg/dL — ABNORMAL HIGH (ref 70–99)
Total Bilirubin: 0.4 mg/dL (ref 0.3–1.2)

## 2012-01-17 LAB — DIFFERENTIAL
Basophils Absolute: 0 10*3/uL (ref 0.0–0.1)
Basophils Relative: 0 % (ref 0–1)
Eosinophils Relative: 2 % (ref 0–5)
Lymphocytes Relative: 15 % (ref 12–46)
Monocytes Absolute: 1.3 10*3/uL — ABNORMAL HIGH (ref 0.1–1.0)

## 2012-01-17 LAB — CBC
Hemoglobin: 8.5 g/dL — ABNORMAL LOW (ref 12.0–15.0)
MCH: 28.6 pg (ref 26.0–34.0)
MCV: 89.2 fL (ref 78.0–100.0)
RBC: 2.97 MIL/uL — ABNORMAL LOW (ref 3.87–5.11)
WBC: 7 10*3/uL (ref 4.0–10.5)

## 2012-01-17 LAB — PROTIME-INR: Prothrombin Time: 13.9 seconds (ref 11.6–15.2)

## 2012-01-17 MED ORDER — POTASSIUM CHLORIDE 10 MEQ/100ML IV SOLN
10.0000 meq | INTRAVENOUS | Status: AC
  Administered 2012-01-17 – 2012-01-18 (×3): 10 meq via INTRAVENOUS
  Filled 2012-01-17 (×3): qty 100

## 2012-01-17 MED ORDER — POTASSIUM CHLORIDE CRYS ER 20 MEQ PO TBCR
60.0000 meq | EXTENDED_RELEASE_TABLET | Freq: Once | ORAL | Status: AC
  Administered 2012-01-17: 60 meq via ORAL
  Filled 2012-01-17: qty 3

## 2012-01-17 MED ORDER — CHLORHEXIDINE GLUCONATE 4 % EX LIQD
60.0000 mL | Freq: Once | CUTANEOUS | Status: DC
  Filled 2012-01-17: qty 60

## 2012-01-17 MED ORDER — DIAZEPAM 5 MG PO TABS
5.0000 mg | ORAL_TABLET | Freq: Every evening | ORAL | Status: DC | PRN
  Administered 2012-01-17 – 2012-01-21 (×3): 5 mg via ORAL
  Filled 2012-01-17 (×3): qty 1

## 2012-01-17 MED ORDER — LATANOPROST 0.005 % OP SOLN
1.0000 [drp] | Freq: Every day | OPHTHALMIC | Status: DC
  Administered 2012-01-17 – 2012-01-21 (×5): 1 [drp] via OPHTHALMIC
  Filled 2012-01-17 (×3): qty 2.5

## 2012-01-17 MED ORDER — DORZOLAMIDE HCL-TIMOLOL MAL 2-0.5 % OP SOLN
1.0000 [drp] | Freq: Two times a day (BID) | OPHTHALMIC | Status: DC
  Administered 2012-01-17 – 2012-01-22 (×9): 1 [drp] via OPHTHALMIC
  Filled 2012-01-17 (×4): qty 10

## 2012-01-17 MED ORDER — LEVOTHYROXINE SODIUM 100 MCG PO TABS
100.0000 ug | ORAL_TABLET | Freq: Every day | ORAL | Status: DC
  Administered 2012-01-19 – 2012-01-22 (×4): 100 ug via ORAL
  Filled 2012-01-17 (×7): qty 1

## 2012-01-17 MED ORDER — HYDROXYZINE HCL 10 MG PO TABS
10.0000 mg | ORAL_TABLET | Freq: Three times a day (TID) | ORAL | Status: DC | PRN
  Filled 2012-01-17: qty 1

## 2012-01-17 MED ORDER — PROPRANOLOL HCL 20 MG PO TABS
20.0000 mg | ORAL_TABLET | Freq: Every day | ORAL | Status: DC
  Administered 2012-01-17: 20 mg via ORAL
  Filled 2012-01-17 (×3): qty 1

## 2012-01-17 MED ORDER — FUROSEMIDE 20 MG PO TABS
20.0000 mg | ORAL_TABLET | Freq: Every day | ORAL | Status: DC
  Administered 2012-01-17: 20 mg via ORAL
  Filled 2012-01-17 (×2): qty 1

## 2012-01-17 MED ORDER — OXYCODONE HCL 5 MG PO TABS
15.0000 mg | ORAL_TABLET | Freq: Four times a day (QID) | ORAL | Status: DC | PRN
  Administered 2012-01-17 – 2012-01-18 (×2): 15 mg via ORAL
  Filled 2012-01-17: qty 3

## 2012-01-17 MED ORDER — GABAPENTIN 300 MG PO CAPS
300.0000 mg | ORAL_CAPSULE | Freq: Every day | ORAL | Status: DC
  Administered 2012-01-17 – 2012-01-21 (×4): 300 mg via ORAL
  Filled 2012-01-17 (×7): qty 1

## 2012-01-17 NOTE — H&P (Signed)
[name] is an 78 y.o. female.   Chief Complaint: Infected Right Total Knee Replacement HPI: 78yo female PMH HTN, thyroid cancer, anemia, chronic pain also with recent admission for hypokalemia and lower extremity edema and allergic reactions to abx, complications s/p right TKA requiring I&D with poly exchange and extended IV antibiotic treatment 11/10/2011. Last set of labwork received in office from 12/18/2011 showed normal sed rate, elevated CRP, normal WBC count.  She has had complications since the last surgical procedure including significant RLE edema, allergic reactions to IV abx (vancomycin) after completing 4 weeks of 6 week course, ID had followed and changed her to IV daptomycin until she had reaction of swollen eyelids and itchy face and it was stopped.  She presents to the outpatient office today with continued swelling and pain in the right knee. We aspirated knee in office today showing gross pus.  Patient not appearing septic and denies any fevers, chills, sweats, erythema.    Past Medical History  Diagnosis Date  . Angina   . Headache   . Hypoglycemia   . Fracture 2008    back  . Macular degeneration of both eyes   . Glaucoma   . Pneumonia   . Shortness of breath 11/08/11    "here lately, all the time"  . Hypothyroidism   . Blood transfusion   . Anemia   . GERD (gastroesophageal reflux disease)   . H/O hiatal hernia   . Arthritis   . Chronic back pain   . Thyroid cancer 1986    Past Surgical History  Procedure Date  . Thyroidectomy   . Shoulder arthroscopy w/ rotator cuff repair   . Trigger finger release     right  thumb  . Abdominal hysterectomy 1963  . Carpal tunnel release     bilaterally  . Tonsillectomy and adenoidectomy   . Appendectomy   . Dilation and curettage of uterus   . Cataract extraction w/ intraocular lens  implant, bilateral   . Joint replacement 2005, 2006, 2012, 11/10/11    left; left; right; right; right  . Goiter removed     "when I  was young"  . Fracture surgery     "broke back"  . Back surgery     "3 back OR's"  . Hand or 09/2011    "left hand; bones were coming together"  . I&d knee with poly exchange 11/10/2011    Procedure: IRRIGATION AND DEBRIDEMENT KNEE WITH POLY EXCHANGE;  Surgeon: Thera Flake., MD;  Location: [facility] OR;  Service: Orthopedics;  Laterality: Right;  . Post polyethelene exchange 11/09/11    RLE    No family history on file. Social History:  reports that she has never smoked. She has never used smokeless tobacco. She reports that she does not drink alcohol or use illicit drugs.  Allergies:  Allergies  Allergen Reactions  . Demerol Shortness Of Breath  . Lorazepam Anxiety  . Penicillins Swelling    "in the face"  . Tetracyclines & Related Rash  . Vancomycin Rash     (Not in a hospital admission)  No results found for this or any previous visit (from the past 48 hour(s)). No results found.  Review of Systems  Constitutional: Negative for fever, chills and weight loss.  HENT: Negative for congestion and sore throat.   Eyes: Negative for blurred vision and pain.  Respiratory: Negative for cough, hemoptysis, sputum production, shortness of breath and wheezing.   Cardiovascular: Positive for leg  swelling. Negative for chest pain, palpitations and claudication.  Gastrointestinal: Negative for heartburn, nausea, vomiting, abdominal pain, diarrhea and constipation.  Genitourinary: Negative for dysuria.  Musculoskeletal: Positive for myalgias, back pain and joint pain. Negative for falls.  Skin: Negative for itching and rash.  Neurological: Positive for headaches. Negative for dizziness, sensory change and focal weakness.    There were no vitals taken for this visit. Physical Exam  Constitutional: She is oriented to person, place, and time. She appears well-developed. She is cooperative.  HENT:  Head: Normocephalic and atraumatic.  Eyes: Conjunctivae and EOM are normal. Pupils are equal,  round, and reactive to light. Right eye exhibits no discharge. Left eye exhibits no discharge.  Neck: Normal range of motion. Neck supple.  Cardiovascular: Normal rate, regular rhythm and normal heart sounds.        Distal pulses difficult to palpate RLE secondary to significant edema, otherwise distal pulses intact, capillary refill intact  Respiratory: Effort normal and breath sounds normal. No respiratory distress. She has no wheezes. She exhibits no tenderness.  GI: Soft. Bowel sounds are normal. She exhibits no distension. There is no tenderness. There is no guarding.  Musculoskeletal: She exhibits edema and tenderness.       Right hip: She exhibits swelling.       Right knee: She exhibits decreased range of motion, swelling and effusion. tenderness found.       Right ankle: She exhibits swelling.  Lymphadenopathy:    She has no cervical adenopathy.  Neurological: She is alert and oriented to person, place, and time. No sensory deficit.  Skin: Skin is intact. No rash noted.  Psychiatric: She has a normal mood and affect.     Assessment/Plan Infected Right Total Knee Replacement  We discussed in length the risks of continued infection to the right knee replacement including sepsis, or even possibility of osteomyelitis which if not treated could eventually lead to amputation or death.  The infection seems to have only been suppressed with poly exchange and IV abx and now that she is off abx presents with gross pus in the knee.  The knee was aspirated and fluid was sent for culture and gram stain, cell count.  We feel she is in need of repeat I&D, removal of total knee replacement, and placement of antibiotic spacer which could potentially be definitive treatment.  Risks and benefits of the procedure were discussed and the patient wishes to proceed.  She will be directly admitted to [facility] today to the orthopaedic service (Dr. Madelon Lips), we would like to consult ID as well as the  hospitalist for antibiotic recommendations as well as assistance with medical management.  We will obtain preoperative labs and studies as needed, plan for surgery tomorrow morning, NPO after midnight.  We would like to get her started on abx tonight but will need to discuss with ID first for recommendations due to multiple allergies.    Margart Sickles 01/17/2012, 12:34 PM

## 2012-01-17 NOTE — Progress Notes (Signed)
CRITICAL VALUE ALERT  Critical value received:  Potassium 2.5  Date of notification:  01/17/12  Time of notification:  1850  Critical value read back:yes  Nurse who received alert:  Mauro Kaufmann  MD notified (1st page):  Twana First PA  Time of first page:  1850  MD notified (2nd page): n/a  Time of second page: n/a  Responding MD:  Twana First PA  Time MD responded: [phone]

## 2012-01-17 NOTE — Consult Note (Addendum)
Reason for Consult:Hypokalemia and management of medical issues. Referring Physician: Dr.Caffery. PCP - Dr.Catherine Sherlyn Hay. [name] is an 78 y.o. female.  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 Medical consult is requested because of hypokalemia. Patient other than her right knee pain and swelling denies any chest pain, shortness of breath, and nausea, abdominal pain or diarrhea. Patient has been on diuretics for her lower extremity edema. And as per recent cardiology notes patient had a normal cardiac catheter in 2008 with normal LV function. Patient also had normal Dopplers of the right lower extremity in March 2013.  Past Medical History  Diagnosis Date  . Angina   . Headache   . Hypoglycemia   . Fracture 2008    back  . Macular degeneration of both eyes   . Glaucoma   . Pneumonia   . Hypothyroidism   . Blood transfusion   . Anemia   . GERD (gastroesophageal reflux disease)   . H/O hiatal hernia   . Arthritis   . Chronic back pain   . Thyroid cancer 1986  . Shortness of breath 11/08/11    "here lately, all the time"  . Exertional dyspnea 01/17/12  . Osteomyelitis 11/2011    right knee    Past Surgical History  Procedure Date  . Thyroidectomy 1986  . Shoulder arthroscopy w/ rotator cuff repair   . Trigger finger release     right  thumb  . Abdominal hysterectomy 1963  . Carpal tunnel release     bilaterally  . Tonsillectomy and adenoidectomy   . Appendectomy   . Dilation and curettage of uterus   . Cataract extraction w/ intraocular lens  implant, bilateral   . Joint replacement  2005, 2006, 2012, 11/10/11    left; left; right; right; right  . Goiter removed 1980's    "when I was young"  . Fracture surgery     "broke back"  . Hand or 09/2011    "left hand; bones were coming together"  . I&d knee with poly exchange 11/10/2011    Procedure: IRRIGATION AND DEBRIDEMENT KNEE WITH POLY EXCHANGE;  Surgeon: Thera Flake., MD;  Location: [facility] OR;  Service: Orthopedics;  Laterality: Right;  . Post polyethelene exchange 11/09/11    RLE  . Back surgery     "3 back OR's;"; S/P "fractured back 2008"  . Peripherally inserted central catheter insertion 11/2011    "removed 12/2011; LUE"    History reviewed. No pertinent family history.  Social History:  reports that she has never smoked. She has never used smokeless tobacco. She reports that she does not drink alcohol or use illicit drugs.  Allergies:  Allergies  Allergen Reactions  . Demerol Shortness Of Breath  . Lorazepam Anxiety  . Penicillins Swelling    "in the face"  . Tetracyclines & Related Rash  . Vancomycin Rash    Medications: I have reviewed the patient's current medications.  Results for orders placed during the hospital encounter of 01/17/12 (from the past 48 hour(s))  APTT     Status: Normal   Collection Time   01/17/12  5:19 PM      Component Value Range Comment   aPTT 37  24 - 37 (seconds)   CBC     Status: Abnormal   Collection Time   01/17/12  5:19 PM      Component Value Range Comment   WBC 7.0  4.0 - 10.5 (K/uL)    RBC 2.97 (*) 3.87 - 5.11 (MIL/uL)    Hemoglobin 8.5 (*) 12.0 - 15.0 (g/dL)    HCT 16.1 (*) 09.6 - 46.0 (%)    MCV 89.2  78.0 - 100.0 (fL)    MCH 28.6  26.0 - 34.0 (pg)    MCHC 32.1  30.0 - 36.0 (g/dL)    RDW 04.5  40.9 - 81.1 (%)    Platelets 368  150 - 400 (K/uL)   COMPREHENSIVE METABOLIC PANEL     Status: Abnormal   Collection Time   01/17/12  5:19 PM      Component Value Range Comment   Sodium 133 (*) 135 - 145 (mEq/L)    Potassium 2.5 (*) 3.5 - 5.1 (mEq/L)    Chloride 85 (*)  96 - 112 (mEq/L)    CO2 38 (*) 19 - 32 (mEq/L)    Glucose, Bld 128 (*) 70 - 99 (mg/dL)    BUN 17  6 - 23 (mg/dL)    Creatinine, Ser 9.14  0.50 - 1.10 (mg/dL)    Calcium 8.5  8.4 - 10.5 (mg/dL)    Total Protein 6.2  6.0 - 8.3 (g/dL)    Albumin 2.7 (*) 3.5 - 5.2 (g/dL)    AST 17  0 - 37 (U/L)    ALT 10  0 - 35 (U/L)    Alkaline Phosphatase 85  39 - 117 (U/L) REPEATED TO VERIFY   Total Bilirubin 0.4  0.3 - 1.2 (mg/dL)    GFR calc non Af Amer 50 (*) >90 (mL/min)    GFR calc Af Amer 58 (*) >90 (mL/min)   DIFFERENTIAL     Status: Abnormal   Collection Time   01/17/12  5:19 PM      Component Value Range Comment   Neutrophils Relative 64  43 - 77 (%)    Neutro Abs 4.5  1.7 - 7.7 (K/uL)    Lymphocytes Relative 15  12 - 46 (%)    Lymphs Abs 1.1  0.7 - 4.0 (K/uL)    Monocytes Relative 19 (*) 3 - 12 (%)    Monocytes Absolute 1.3 (*) 0.1 - 1.0 (K/uL)    Eosinophils Relative 2  0 - 5 (%)    Eosinophils Absolute 0.1  0.0 - 0.7 (K/uL)    Basophils Relative 0  0 - 1 (%)    Basophils Absolute 0.0  0.0 - 0.1 (K/uL)   PROTIME-INR     Status: Normal   Collection Time   01/17/12  5:19 PM      Component Value Range Comment   Prothrombin Time 13.9  11.6 - 15.2 (seconds)    INR 1.05  0.00 - 1.49    TYPE AND SCREEN     Status: Normal   Collection Time   01/17/12  5:20 PM      Component Value Range Comment   ABO/RH(D) O POS      Antibody Screen POS      Sample Expiration 01/20/2012       Dg Chest Portable 1 View  01/17/2012  *RADIOLOGY REPORT*  Clinical Data: Preoperative right knee assessment.  Prior thyroidectomy.  History of angina.  Chronic back pain.  PORTABLE CHEST - 1 VIEW  Comparison: 12/19/2011  Findings: The patient is rotated to the right on today's exam, resulting in reduced diagnostic sensitivity and specificity. Considerable thoracic spondylosis is present.  Scarring noted in the lingula and right middle lobe.  Heart size is within normal limits.  IMPRESSION:  1.  Thoracic spondylosis. 2.   Mild scarring in the lingula and right middle lobe.  Original Report Authenticated By: Dellia Cloud, M.D.    Review of Systems  Constitutional: Negative.   HENT: Negative.   Eyes: Negative.   Respiratory: Negative.   Cardiovascular: Negative.   Gastrointestinal: Negative.   Genitourinary: Negative.   Musculoskeletal:       Right knee pain and swelling.  Skin: Negative.   Neurological: Negative.   Endo/Heme/Allergies: Negative.   Psychiatric/Behavioral: Negative.    Blood pressure 116/42, pulse 86, temperature 99.2 F (37.3 C), resp. rate 16, SpO2 97.00%. Physical Exam  Constitutional: She is oriented to person, place, and time. She appears well-developed and well-nourished. No distress.  HENT:  Head: Normocephalic and atraumatic.  Right Ear: External ear normal.  Left Ear: External ear normal.  Nose: Nose normal.  Mouth/Throat: Oropharynx is clear and moist. No oropharyngeal exudate.  Eyes: Conjunctivae are normal. Pupils are equal, round, and reactive to light. Right eye exhibits no discharge. Left eye exhibits no discharge. No scleral icterus.  Neck: Normal range of motion. Neck supple.  Cardiovascular: Normal rate and regular rhythm.   Respiratory: Effort normal and breath sounds normal. No respiratory distress. She has no wheezes. She has no rales.  GI: Soft. Bowel sounds are normal. She exhibits no distension. There is no tenderness. There is no rebound.  Musculoskeletal: She exhibits edema.       Right knee swollen and skin indurated with erythema. Swelling of both lower extremity. More on the right side.No acute ischemic changes.  Neurological: She is alert and oriented to person, place, and time.       Moves all extremities.  Skin: She is not diaphoretic.  Psychiatric: Her behavior is normal.    Assessment/Plan: #1. Hypokalemia probably from diuretics - we will replace potassium both orally and IV. Recheck metabolic panel in a.m. Check magnesium levels.  Continue her other present medications. #2. Normocytic and normochromic anemia  - Reviewing her chart patient does have anemia even in March and hemoglobin was around 9. Patient states she had not known that she had anemia and never had workup done for this before. For now will closely follow CBC. Will eventually need anemia workup as outpatient with her primary care physician including a colonoscopy. #3. Chronic lower extremity edema - patient's edema is more on the right upper extremity than the left. Patient had a Doppler in March 2013 which was negative for DVT. I will reorder a Doppler of lower extremity to rule out DVT. Patient is on diuretics for the same which we'll continue. #4. Postsurgical hypothyroidism - continue Synthroid. #5. Infected prosthetic right knee - further recommendations per infectious disease consultant and orthopedics. #6. Hypertension - continue diuretics.  Thanks for involving [place] in patient's care and we will follow along with you.  Eduard Clos. 01/17/2012, 8:59 PM

## 2012-01-17 NOTE — Consult Note (Signed)
Date of Admission:  01/17/2012  Date of Consult:  01/17/2012  Reason for Consult: recurrent prosthetic joint infection  Referring Physician: Dr. Vincent Peyer   HPI: [name] is an 78 y.o. female. who is status post polyethelene exchange 11/09/11 being treated for group b streptococcus prosthetic joint infection and deep wound/presumed osteomyelitis. She was placed on vancomycin due to significant penicillin allergy, however, 4 wks into her therapy, she develops a diffuse macular papular rash which is intensely pruritic. She stopped taking vancomycin on April 10th. She was seen by Dr. Drue Second and changed to daptomycin. Unfortunately the patient had a severe reaction to daptomycin with swelling of her eyelids. She stopped this antibiotic altogether. She was admitted to the hospital with this infection and with significant weight gain then changed to levofloxacin. Her PICC line was removed. In the interim she developed significant swelling and pain in her knee. She's been admitted to the hospital for plans to perform incision and debridement with removal of her prosthetic knee and placement of antibiotic spacer. We are consulted by orthopedic surgery etc. antibiotic options in this patient. I do not want to give her antibiotics at this point time but rather prefer to start him after we've obtained the intraoperative cultures. Anticipate that she will again grow a group B streptococcal species or failed to grow any organisms. I think the oral levofloxacin that we had been previously prescribed would be very reasonable once we have gotten cultures.  Past Medical History  Diagnosis Date  . Angina   . Headache   . Hypoglycemia   . Fracture 2008    back  . Macular degeneration of both eyes   . Glaucoma   . Pneumonia   . Shortness of breath 11/08/11    "here lately, all the time"  . Hypothyroidism   . Blood transfusion   . Anemia   . GERD (gastroesophageal reflux disease)   . H/O hiatal hernia   .  Arthritis   . Chronic back pain   . Thyroid cancer 1986    Past Surgical History  Procedure Date  . Thyroidectomy   . Shoulder arthroscopy w/ rotator cuff repair   . Trigger finger release     right  thumb  . Abdominal hysterectomy 1963  . Carpal tunnel release     bilaterally  . Tonsillectomy and adenoidectomy   . Appendectomy   . Dilation and curettage of uterus   . Cataract extraction w/ intraocular lens  implant, bilateral   . Joint replacement 2005, 2006, 2012, 11/10/11    left; left; right; right; right  . Goiter removed     "when I was young"  . Fracture surgery     "broke back"  . Back surgery     "3 back OR's"  . Hand or 09/2011    "left hand; bones were coming together"  . I&d knee with poly exchange 11/10/2011    Procedure: IRRIGATION AND DEBRIDEMENT KNEE WITH POLY EXCHANGE;  Surgeon: Thera Flake., MD;  Location: [facility] OR;  Service: Orthopedics;  Laterality: Right;  . Post polyethelene exchange 11/09/11    RLE  ergies:   Allergies  Allergen Reactions  . Demerol Shortness Of Breath  . Lorazepam Anxiety  . Penicillins Swelling    "in the face"  . Tetracyclines & Related Rash  . Vancomycin Rash     Medications: I have reviewed patients current medications as documented in Epic Anti-infectives    None      Social  History:  reports that she has never smoked. She has never used smokeless tobacco. She reports that she does not drink alcohol or use illicit drugs.  No family history on file.  As in HPI and primary teams notes otherwise 12 point review of systems is negative  There were no vitals taken for this visit.  General: Alert and awake, oriented x3, not in any acute distress. HEENT: anicteric sclera, pupils reactive to light and accommodation, EOMI, oropharynx clear and without exudate CVS regular rate, normal r,  no murmur rubs or gallops Chest: clear to auscultation bilaterally, no wheezing, rales or rhonchi Abdomen: soft nontender, nondistended,  normal bowel sounds, Extremities right knee is swollen and erythematous. Skin: no rashes Neuro: nonfocal, strength and sensation intact   No results found for this or any previous visit (from the past 48 hour(s)).    Component Value Date/Time   SDES ABSCESS RIGHT KNEE 11/10/2011 0915   SDES ABSCESS RIGHT KNEE 11/10/2011 0915   SDES ABSCESS RIGHT KNEE 11/10/2011 0915   SPECREQUEST NONE 11/10/2011 0915   SPECREQUEST NONE 11/10/2011 0915   SPECREQUEST NONE 11/10/2011 0915   CULT NO ANAEROBES ISOLATED 11/10/2011 0915   CULT NO ACID FAST BACILLI ISOLATED IN 6 WEEKS 11/10/2011 0915   CULT  Value: RARE GROUP B STREP(S.AGALACTIAE)ISOLATED Note: TESTING AGAINST S. AGALACTIAE NOT ROUTINELY PERFORMED DUE TO PREDICTABILITY OF AMP/PEN/VAN SUSCEPTIBILITY. 11/10/2011 0915   REPTSTATUS 11/16/2011 FINAL 11/10/2011 0915   REPTSTATUS 12/23/2011 FINAL 11/10/2011 0915   REPTSTATUS 11/13/2011 FINAL 11/10/2011 0915   No results found.   No results found for this or any previous visit (from the past 720 hour(s)).   Impression/Recommendation 78 year old lady  status post polyethelene exchange 11/09/11 being treated for group b streptococcus prosthetic joint infection and deep wound/presumed osteomyelitis with intolerance to multiple antibiotics including recent rash to vancomycin eyelid swelling to daptomycin and severe penicillin allergy and tetracycline allergy. I have reviewed her records from each chart and I cannot see that she was ever given a cephalosporin or a carbapenem [place] hazard those antibiotics she has been given fluoroquinolones and clindamycin without apparent adverse reactions.  #1 prosthetic knee infection: --Agree with surgery tomorrow with removal of her prosthetic knee and intraoperative cultures off antibiotics --I expect to grow group B streptococcal species or nothing --I will plan on placing her on oral levofloxacin postoperatively. --To consider also placing her on oral antibiotics to try to attenuate  risk of C. difficile colitis that is going to be higher with this fluoroquinolone. --Check a sedimentation rate and C-reactive protein --Will plan on 4 weeks to 6 weeks of postoperative antibiotics.   Thank you so much for this interesting consult,   Acey Lav 01/17/2012, 5:13 PM   [phone] (pager) [phone] (office)

## 2012-01-18 ENCOUNTER — Encounter (HOSPITAL_COMMUNITY): Payer: Self-pay | Admitting: Anesthesiology

## 2012-01-18 ENCOUNTER — Encounter (HOSPITAL_COMMUNITY)
Admission: AD | Disposition: A | Discharge status: Home Health | Payer: Self-pay | Source: Ambulatory Visit | Attending: Orthopedic Surgery

## 2012-01-18 ENCOUNTER — Ambulatory Visit (HOSPITAL_COMMUNITY): Admission: RE | Admit: 2012-01-18 | Payer: Medicare Other | Source: Ambulatory Visit | Admitting: Orthopedic Surgery

## 2012-01-18 ENCOUNTER — Inpatient Hospital Stay (HOSPITAL_COMMUNITY): Payer: Medicare Other | Admitting: Anesthesiology

## 2012-01-18 DIAGNOSIS — Z96659 Presence of unspecified artificial knee joint: Secondary | ICD-10-CM

## 2012-01-18 DIAGNOSIS — R112 Nausea with vomiting, unspecified: Secondary | ICD-10-CM

## 2012-01-18 DIAGNOSIS — T847XXA Infection and inflammatory reaction due to other internal orthopedic prosthetic devices, implants and grafts, initial encounter: Secondary | ICD-10-CM | POA: Diagnosis not present

## 2012-01-18 DIAGNOSIS — M009 Pyogenic arthritis, unspecified: Secondary | ICD-10-CM | POA: Diagnosis not present

## 2012-01-18 DIAGNOSIS — T8450XA Infection and inflammatory reaction due to unspecified internal joint prosthesis, initial encounter: Secondary | ICD-10-CM | POA: Diagnosis not present

## 2012-01-18 DIAGNOSIS — E876 Hypokalemia: Secondary | ICD-10-CM

## 2012-01-18 DIAGNOSIS — M7989 Other specified soft tissue disorders: Secondary | ICD-10-CM | POA: Diagnosis not present

## 2012-01-18 DIAGNOSIS — D62 Acute posthemorrhagic anemia: Secondary | ICD-10-CM | POA: Diagnosis not present

## 2012-01-18 DIAGNOSIS — T8459XA Infection and inflammatory reaction due to other internal joint prosthesis, initial encounter: Secondary | ICD-10-CM

## 2012-01-18 LAB — CBC
Platelets: 366 10*3/uL (ref 150–400)
RBC: 2.87 MIL/uL — ABNORMAL LOW (ref 3.87–5.11)
WBC: 5.7 10*3/uL (ref 4.0–10.5)

## 2012-01-18 LAB — SURGICAL PCR SCREEN
MRSA, PCR: NEGATIVE
Staphylococcus aureus: NEGATIVE

## 2012-01-18 LAB — COMPREHENSIVE METABOLIC PANEL
Albumin: 2.4 g/dL — ABNORMAL LOW (ref 3.5–5.2)
BUN: 13 mg/dL (ref 6–23)
Chloride: 90 mEq/L — ABNORMAL LOW (ref 96–112)
Creatinine, Ser: 0.9 mg/dL (ref 0.50–1.10)
GFR calc non Af Amer: 57 mL/min — ABNORMAL LOW (ref >90)
Total Bilirubin: 0.4 mg/dL (ref 0.3–1.2)

## 2012-01-18 LAB — DIFFERENTIAL
Eosinophils Relative: 2 % (ref 0–5)
Lymphocytes Relative: 24 % (ref 12–46)
Lymphs Abs: 1.4 10*3/uL (ref 0.7–4.0)
Monocytes Relative: 16 % — ABNORMAL HIGH (ref 3–12)
Neutrophils Relative %: 57 % (ref 43–77)

## 2012-01-18 LAB — URINALYSIS, ROUTINE W REFLEX MICROSCOPIC
Bilirubin Urine: NEGATIVE
Leukocytes, UA: NEGATIVE
Nitrite: NEGATIVE
Specific Gravity, Urine: 1.011 (ref 1.005–1.030)
Urobilinogen, UA: 1 mg/dL (ref 0.0–1.0)
pH: 7.5 (ref 5.0–8.0)

## 2012-01-18 LAB — MAGNESIUM: Magnesium: 2.1 mg/dL (ref 1.5–2.5)

## 2012-01-18 LAB — SEDIMENTATION RATE: Sed Rate: 84 mm/hr — ABNORMAL HIGH (ref 0–22)

## 2012-01-18 SURGERY — REMOVAL, TOTAL ARTHROPLASTY HARDWARE, KNEE, WITH ANTIBIOTIC SPACER INSERTION
Anesthesia: General | Site: Knee | Laterality: Right | Wound class: Dirty or Infected

## 2012-01-18 MED ORDER — DOCUSATE SODIUM 100 MG PO CAPS
100.0000 mg | ORAL_CAPSULE | Freq: Two times a day (BID) | ORAL | Status: DC
  Administered 2012-01-19 – 2012-01-20 (×5): 100 mg via ORAL
  Filled 2012-01-18 (×9): qty 1

## 2012-01-18 MED ORDER — ONDANSETRON HCL 4 MG/2ML IJ SOLN
INTRAMUSCULAR | Status: DC | PRN
  Administered 2012-01-18 (×2): 4 mg via INTRAVENOUS

## 2012-01-18 MED ORDER — MINERAL OIL LIGHT 100 % EX OIL
TOPICAL_OIL | CUTANEOUS | Status: DC | PRN
  Administered 2012-01-18: 1 via TOPICAL

## 2012-01-18 MED ORDER — ONDANSETRON HCL 4 MG PO TABS: 4.0000 mg | ORAL_TABLET | Freq: Four times a day (QID) | ORAL | Status: DC | PRN

## 2012-01-18 MED ORDER — PROPOFOL 10 MG/ML IV EMUL
INTRAVENOUS | Status: DC | PRN
  Administered 2012-01-18: 140 mg via INTRAVENOUS

## 2012-01-18 MED ORDER — SODIUM CHLORIDE 0.9 % IV SOLN
INTRAVENOUS | Status: DC
  Administered 2012-01-18: 18:00:00 via INTRAVENOUS

## 2012-01-18 MED ORDER — PHENOL 1.4 % MT LIQD: 1.0000 | OROMUCOSAL | Status: DC | PRN

## 2012-01-18 MED ORDER — TORSEMIDE 20 MG PO TABS
40.0000 mg | ORAL_TABLET | Freq: Every day | ORAL | Status: DC
  Administered 2012-01-19: 40 mg via ORAL
  Filled 2012-01-18 (×2): qty 2

## 2012-01-18 MED ORDER — MENTHOL 3 MG MT LOZG
1.0000 | LOZENGE | OROMUCOSAL | Status: DC | PRN
  Filled 2012-01-18: qty 9

## 2012-01-18 MED ORDER — LEVOFLOXACIN IN D5W 750 MG/150ML IV SOLN
750.0000 mg | INTRAVENOUS | Status: DC
  Filled 2012-01-18: qty 150

## 2012-01-18 MED ORDER — LACTATED RINGERS IV SOLN
INTRAVENOUS | Status: DC | PRN
  Administered 2012-01-18: 07:00:00 via INTRAVENOUS

## 2012-01-18 MED ORDER — NEOSTIGMINE METHYLSULFATE 1 MG/ML IJ SOLN
INTRAMUSCULAR | Status: DC | PRN
  Administered 2012-01-18: 4 mg via INTRAVENOUS

## 2012-01-18 MED ORDER — OXYCODONE HCL 5 MG PO TABS
15.0000 mg | ORAL_TABLET | ORAL | Status: DC | PRN
  Administered 2012-01-18 – 2012-01-19 (×4): 15 mg via ORAL
  Filled 2012-01-18 (×4): qty 3

## 2012-01-18 MED ORDER — METHOCARBAMOL 500 MG PO TABS
500.0000 mg | ORAL_TABLET | Freq: Four times a day (QID) | ORAL | Status: DC | PRN
  Administered 2012-01-19: 500 mg via ORAL
  Filled 2012-01-18: qty 1

## 2012-01-18 MED ORDER — LEVOFLOXACIN IN D5W 500 MG/100ML IV SOLN
500.0000 mg | INTRAVENOUS | Status: DC
  Filled 2012-01-18: qty 100

## 2012-01-18 MED ORDER — ENOXAPARIN SODIUM 30 MG/0.3ML ~~LOC~~ SOLN
30.0000 mg | Freq: Two times a day (BID) | SUBCUTANEOUS | Status: DC
  Administered 2012-01-19 – 2012-01-22 (×7): 30 mg via SUBCUTANEOUS
  Filled 2012-01-18 (×9): qty 0.3

## 2012-01-18 MED ORDER — SODIUM CHLORIDE 0.9 % IR SOLN
Status: DC | PRN
  Administered 2012-01-18: 3000 mL
  Administered 2012-01-18: 1000 mL

## 2012-01-18 MED ORDER — BUPIVACAINE-EPINEPHRINE 0.5% -1:200000 IJ SOLN
INTRAMUSCULAR | Status: DC | PRN
  Administered 2012-01-18: 30 mL

## 2012-01-18 MED ORDER — ONDANSETRON HCL 4 MG/2ML IJ SOLN: 4.0000 mg | Freq: Four times a day (QID) | INTRAMUSCULAR | Status: DC | PRN

## 2012-01-18 MED ORDER — FLEET ENEMA 7-19 GM/118ML RE ENEM: 1.0000 | ENEMA | Freq: Once | RECTAL | Status: AC | PRN

## 2012-01-18 MED ORDER — ROCURONIUM BROMIDE 100 MG/10ML IV SOLN
INTRAVENOUS | Status: DC | PRN
  Administered 2012-01-18: 50 mg via INTRAVENOUS

## 2012-01-18 MED ORDER — MIDAZOLAM HCL 5 MG/5ML IJ SOLN
INTRAMUSCULAR | Status: DC | PRN
  Administered 2012-01-18: 0.5 mg via INTRAVENOUS

## 2012-01-18 MED ORDER — PHENYLEPHRINE HCL 10 MG/ML IJ SOLN
INTRAMUSCULAR | Status: DC | PRN
  Administered 2012-01-18 (×2): 40 ug via INTRAVENOUS
  Administered 2012-01-18 (×2): 80 ug via INTRAVENOUS
  Administered 2012-01-18 (×2): 40 ug via INTRAVENOUS
  Administered 2012-01-18: 80 ug via INTRAVENOUS
  Administered 2012-01-18: 40 ug via INTRAVENOUS

## 2012-01-18 MED ORDER — FENTANYL CITRATE 0.05 MG/ML IJ SOLN: 50.0000 ug | INTRAMUSCULAR | Status: DC | PRN

## 2012-01-18 MED ORDER — METOCLOPRAMIDE HCL 5 MG/ML IJ SOLN
INTRAMUSCULAR | Status: DC | PRN
  Administered 2012-01-18 (×2): 10 mg via INTRAVENOUS

## 2012-01-18 MED ORDER — METOCLOPRAMIDE HCL 5 MG/ML IJ SOLN: 10.0000 mg | Freq: Once | INTRAMUSCULAR | Status: DC | PRN

## 2012-01-18 MED ORDER — DEXTROSE 5 % IV SOLN
500.0000 mg | Freq: Four times a day (QID) | INTRAVENOUS | Status: DC | PRN
  Filled 2012-01-18: qty 5

## 2012-01-18 MED ORDER — HYDROMORPHONE HCL PF 1 MG/ML IJ SOLN
0.5000 mg | INTRAMUSCULAR | Status: DC | PRN
  Administered 2012-01-18 – 2012-01-19 (×6): 1 mg via INTRAVENOUS
  Filled 2012-01-18 (×6): qty 1

## 2012-01-18 MED ORDER — GENTAMICIN SULFATE 40 MG/ML IJ SOLN
INTRAMUSCULAR | Status: DC | PRN
  Administered 2012-01-18: 24 mL via INTRAMUSCULAR

## 2012-01-18 MED ORDER — METOCLOPRAMIDE HCL 5 MG/ML IJ SOLN
5.0000 mg | Freq: Three times a day (TID) | INTRAMUSCULAR | Status: DC | PRN
  Filled 2012-01-18: qty 2

## 2012-01-18 MED ORDER — SODIUM CHLORIDE 0.9 % IV SOLN
INTRAVENOUS | Status: DC | PRN
  Administered 2012-01-18: 09:00:00 via INTRAVENOUS

## 2012-01-18 MED ORDER — SENNOSIDES-DOCUSATE SODIUM 8.6-50 MG PO TABS
1.0000 | ORAL_TABLET | Freq: Every evening | ORAL | Status: DC | PRN
  Administered 2012-01-20: 1 via ORAL
  Filled 2012-01-18 (×2): qty 1

## 2012-01-18 MED ORDER — FENTANYL CITRATE 0.05 MG/ML IJ SOLN
INTRAMUSCULAR | Status: DC | PRN
  Administered 2012-01-18 (×2): 25 ug via INTRAVENOUS
  Administered 2012-01-18: 75 ug via INTRAVENOUS
  Administered 2012-01-18: 100 ug via INTRAVENOUS

## 2012-01-18 MED ORDER — ACETAMINOPHEN 650 MG RE SUPP: 650.0000 mg | Freq: Four times a day (QID) | RECTAL | Status: DC | PRN

## 2012-01-18 MED ORDER — METHOCARBAMOL 100 MG/ML IJ SOLN
500.0000 mg | INTRAVENOUS | Status: AC
  Administered 2012-01-18: 500 mg via INTRAVENOUS
  Filled 2012-01-18: qty 5

## 2012-01-18 MED ORDER — GLYCOPYRROLATE 0.2 MG/ML IJ SOLN
INTRAMUSCULAR | Status: DC | PRN
  Administered 2012-01-18: 0.6 mg via INTRAVENOUS

## 2012-01-18 MED ORDER — POTASSIUM CHLORIDE 10 MEQ/100ML IV SOLN
10.0000 meq | INTRAVENOUS | Status: AC
  Filled 2012-01-18 (×3): qty 100

## 2012-01-18 MED ORDER — ACETAMINOPHEN 325 MG PO TABS
650.0000 mg | ORAL_TABLET | Freq: Four times a day (QID) | ORAL | Status: DC | PRN
  Administered 2012-01-20: 650 mg via ORAL
  Filled 2012-01-18: qty 2

## 2012-01-18 MED ORDER — METOCLOPRAMIDE HCL 5 MG PO TABS
5.0000 mg | ORAL_TABLET | Freq: Three times a day (TID) | ORAL | Status: DC | PRN
  Filled 2012-01-18: qty 2

## 2012-01-18 MED ORDER — HYDROMORPHONE HCL PF 1 MG/ML IJ SOLN
0.2500 mg | INTRAMUSCULAR | Status: DC | PRN
  Administered 2012-01-18 (×7): 0.5 mg via INTRAVENOUS

## 2012-01-18 SURGICAL SUPPLY — 72 items
BANDAGE ELASTIC 4 VELCRO ST LF (GAUZE/BANDAGES/DRESSINGS) ×2 IMPLANT
BANDAGE ELASTIC 6 VELCRO ST LF (GAUZE/BANDAGES/DRESSINGS) ×2 IMPLANT
BANDAGE ESMARK 6X9 LF (GAUZE/BANDAGES/DRESSINGS) ×1 IMPLANT
BLADE SAGITTAL 25.0X1.19X90 (BLADE) ×2 IMPLANT
BLADE SAW SAG 90X13X1.27 (BLADE) ×2 IMPLANT
BNDG ESMARK 6X9 LF (GAUZE/BANDAGES/DRESSINGS) ×2
BONE CEMENT GENTAMICIN (Cement) ×6 IMPLANT
BOWL SMART MIX CTS (DISPOSABLE) ×2 IMPLANT
CEMENT BONE GENTAMICIN 40 (Cement) ×3 IMPLANT
CEMENT HV SMART SET (Cement) ×4 IMPLANT
CLOTH BEACON ORANGE TIMEOUT ST (SAFETY) ×2 IMPLANT
CO AXIAL FAN SPRAY TIP SOFT SH (MISCELLANEOUS) ×2 IMPLANT
COVER BACK TABLE 24X17X13 BIG (DRAPES) ×2 IMPLANT
COVER SURGICAL LIGHT HANDLE (MISCELLANEOUS) ×2 IMPLANT
CUFF TOURNIQUET SINGLE 34IN LL (TOURNIQUET CUFF) ×2 IMPLANT
CUFF TOURNIQUET SINGLE 44IN (TOURNIQUET CUFF) IMPLANT
DRAPE INCISE IOBAN 66X45 STRL (DRAPES) IMPLANT
DRAPE ORTHO SPLIT 77X108 STRL (DRAPES) ×2
DRAPE SURG ORHT 6 SPLT 77X108 (DRAPES) ×2 IMPLANT
DRAPE U-SHAPE 47X51 STRL (DRAPES) ×2 IMPLANT
DRILL BIT 7/64X5 (BIT) ×2 IMPLANT
DRSG ADAPTIC 3X8 NADH LF (GAUZE/BANDAGES/DRESSINGS) IMPLANT
DRSG PAD ABDOMINAL 8X10 ST (GAUZE/BANDAGES/DRESSINGS) ×2 IMPLANT
DURAPREP 26ML APPLICATOR (WOUND CARE) ×2 IMPLANT
ELECT REM PT RETURN 9FT ADLT (ELECTROSURGICAL) ×2
ELECTRODE REM PT RTRN 9FT ADLT (ELECTROSURGICAL) ×1 IMPLANT
EVACUATOR 1/8 PVC DRAIN (DRAIN) IMPLANT
FACESHIELD LNG OPTICON STERILE (SAFETY) ×4 IMPLANT
FLOSEAL 10ML (HEMOSTASIS) IMPLANT
GLOVE BIOGEL PI IND STRL 8 (GLOVE) ×2 IMPLANT
GLOVE BIOGEL PI IND STRL 8.5 (GLOVE) ×1 IMPLANT
GLOVE BIOGEL PI INDICATOR 8 (GLOVE) ×2
GLOVE BIOGEL PI INDICATOR 8.5 (GLOVE) ×1
GLOVE ORTHO TXT STRL SZ7.5 (GLOVE) ×2 IMPLANT
GLOVE SURG ORTHO 8.0 STRL STRW (GLOVE) ×4 IMPLANT
GOWN PREVENTION PLUS XLARGE (GOWN DISPOSABLE) ×4 IMPLANT
GOWN PREVENTION PLUS XXLARGE (GOWN DISPOSABLE) ×2 IMPLANT
GOWN STRL NON-REIN LRG LVL3 (GOWN DISPOSABLE) ×2 IMPLANT
HANDPIECE INTERPULSE COAX TIP (DISPOSABLE) ×1
HOOD PEEL AWAY FACE SHEILD DIS (HOOD) ×4 IMPLANT
IMMOBILIZER KNEE 22 UNIV (SOFTGOODS) IMPLANT
KIT BASIN OR (CUSTOM PROCEDURE TRAY) ×2 IMPLANT
KIT ROOM TURNOVER OR (KITS) ×2 IMPLANT
KIT STIMULAN RAPID CURE  10CC (Orthopedic Implant) ×2 IMPLANT
KIT STIMULAN RAPID CURE 10CC (Orthopedic Implant) ×2 IMPLANT
MANIFOLD NEPTUNE II (INSTRUMENTS) ×2 IMPLANT
NEEDLE 22X1 1/2 (OR ONLY) (NEEDLE) ×2 IMPLANT
NS IRRIG 1000ML POUR BTL (IV SOLUTION) ×2 IMPLANT
PACK TOTAL JOINT (CUSTOM PROCEDURE TRAY) ×2 IMPLANT
PAD ARMBOARD 7.5X6 YLW CONV (MISCELLANEOUS) ×4 IMPLANT
PAD CAST 4YDX4 CTTN HI CHSV (CAST SUPPLIES) IMPLANT
PADDING CAST COTTON 4X4 STRL (CAST SUPPLIES)
PADDING CAST COTTON 6X4 STRL (CAST SUPPLIES) IMPLANT
SET HNDPC FAN SPRY TIP SCT (DISPOSABLE) ×1 IMPLANT
SPONGE GAUZE 4X4 12PLY (GAUZE/BANDAGES/DRESSINGS) IMPLANT
STAPLER VISISTAT 35W (STAPLE) ×2 IMPLANT
SUCTION FRAZIER TIP 10 FR DISP (SUCTIONS) ×2 IMPLANT
SUT ETHIBOND NAB CT1 #1 30IN (SUTURE) IMPLANT
SUT PDS AB 1 CT  36 (SUTURE) ×2
SUT PDS AB 1 CT 36 (SUTURE) ×2 IMPLANT
SUT PDS AB 2-0 CT1 27 (SUTURE) ×4 IMPLANT
SUT VIC AB 0 CT1 27 (SUTURE)
SUT VIC AB 0 CT1 27XBRD ANBCTR (SUTURE) IMPLANT
SUT VIC AB 2-0 CT1 27 (SUTURE)
SUT VIC AB 2-0 CT1 TAPERPNT 27 (SUTURE) IMPLANT
SWAB COLLECTION DEVICE MRSA (MISCELLANEOUS) ×2 IMPLANT
SYR CONTROL 10ML LL (SYRINGE) IMPLANT
TOWEL OR 17X24 6PK STRL BLUE (TOWEL DISPOSABLE) ×2 IMPLANT
TOWEL OR 17X26 10 PK STRL BLUE (TOWEL DISPOSABLE) ×2 IMPLANT
TRAY FOLEY CATH 14FR (SET/KITS/TRAYS/PACK) ×2 IMPLANT
TUBE ANAEROBIC SPECIMEN COL (MISCELLANEOUS) ×2 IMPLANT
WATER STERILE IRR 1000ML POUR (IV SOLUTION) ×4 IMPLANT

## 2012-01-18 NOTE — Progress Notes (Signed)
ANTIBIOTIC CONSULT NOTE - INITIAL  Pharmacy Consult for levaquin Indication: recurrent infected prosthetic joint Allergies  Allergen Reactions  . Demerol Shortness Of Breath  . Lorazepam Anxiety  . Penicillins Swelling    "in the face"  . Tetracyclines & Related Rash  . Vancomycin Rash    Patient Measurements: Height: 5\' 3"  (160 cm) Weight: 168 lb 14 oz (76.6 kg) IBW/kg (Calculated) : 52.4  Adjusted Body Weight:   Vital Signs: Temp: 98 F (36.7 C) (05/17 1640) Temp src: Oral (05/17 1640) BP: 128/60 mmHg (05/17 1640) Pulse Rate: 73  (05/17 1640) Intake/Output from previous day: 05/16 0701 - 05/17 0700 In: 0  Out: 1000 [Urine:1000] Intake/Output from this shift: Total I/O In: 800 [I.V.:450; Blood:350] Out: 700 [Urine:650; Blood:50]  Labs:  [facility] 01/18/12 0511 01/17/12 1719  WBC 5.7 7.0  HGB 8.2* 8.5*  PLT 366 368  LABCREA -- --  CREATININE 0.90 1.00   Estimated Creatinine Clearance: 45.6 ml/min (by C-G formula based on Cr of 0.9). No results found for this basename: VANCOTROUGH:2,VANCOPEAK:2,VANCORANDOM:2,GENTTROUGH:2,GENTPEAK:2,GENTRANDOM:2,TOBRATROUGH:2,TOBRAPEAK:2,TOBRARND:2,AMIKACINPEAK:2,AMIKACINTROU:2,AMIKACIN:2, in the last 72 hours   Microbiology: Recent Results (from the past 720 hour(s))  SURGICAL PCR SCREEN     Status: Normal   Collection Time   01/18/12  4:01 AM      Component Value Range Status Comment   MRSA, PCR NEGATIVE  NEGATIVE  Final    Staphylococcus aureus NEGATIVE  NEGATIVE  Final     Medical History: Past Medical History  Diagnosis Date  . Angina   . Headache   . Hypoglycemia   . Fracture 2008    back  . Macular degeneration of both eyes   . Glaucoma   . Pneumonia   . Hypothyroidism   . Blood transfusion   . Anemia   . GERD (gastroesophageal reflux disease)   . H/O hiatal hernia   . Arthritis   . Chronic back pain   . Thyroid cancer 1986  . Shortness of breath 11/08/11    "here lately, all the time"  . Exertional  dyspnea 01/17/12  . Osteomyelitis 11/2011    right knee    Medications:  Prescriptions prior to admission  Medication Sig Dispense Refill  . diazepam (VALIUM) 5 MG tablet Take 1 tablet by mouth At bedtime as needed. For sleep      . dorzolamide-timolol (COSOPT) 22.3-6.8 MG/ML ophthalmic solution Place 1 drop into both eyes 2 (two) times daily.       Marland Kitchen gabapentin (NEURONTIN) 300 MG capsule Take 300 mg by mouth at bedtime.       Marland Kitchen latanoprost (XALATAN) 0.005 % ophthalmic solution Place 1 drop into both eyes at bedtime.       Marland Kitchen levothyroxine (SYNTHROID, LEVOTHROID) 100 MCG tablet Take 100 mcg by mouth daily.       Marland Kitchen oxyCODONE (ROXICODONE) 15 MG immediate release tablet Take 1 tablet by mouth Every 6 hours as needed. For pain      . potassium chloride SA (K-DUR,KLOR-CON) 20 MEQ tablet Take 2 tablets (40 mEq total) by mouth 2 (two) times daily.  30 tablet  1  . propranolol (INDERAL) 20 MG tablet Take 20 mg by mouth daily. Takes for migraines.      . torsemide (DEMADEX) 20 MG tablet Take 2 tablets (40 mg total) by mouth daily.  30 tablet  1  . triamterene-hydrochlorothiazide (MAXZIDE-25) 37.5-25 MG per tablet Take 0.5 tablets by mouth daily.       Assessment: 78 yo lady with recurrent infection prothesis  R knee to start levaquin for antibiotic coverage.  Goal of Therapy:  Eradication of infection  Plan:  Levaquin 750 mg IV q48 hours. F/u clinical course, cultures and renal function.  [name] 01/18/2012,6:00 PM

## 2012-01-18 NOTE — Anesthesia Postprocedure Evaluation (Signed)
Anesthesia Post Note  Patient: [name]  Procedure(s) Performed: Procedure(s) (LRB): EXCISIONAL TOTAL KNEE ARTHROPLASTY WITH ANTIBIOTIC SPACERS (Right)  Anesthesia type: general  Patient location: PACU  Post pain: Pain level controlled  Post assessment: Patient's Cardiovascular Status Stable  Last Vitals:  Filed Vitals:   01/18/12 1028  BP:   Pulse:   Temp: 36.7 C  Resp:     Post vital signs: Reviewed and stable  Level of consciousness: sedated  Complications: No apparent anesthesia complications

## 2012-01-18 NOTE — Interval H&P Note (Signed)
History and Physical Interval Note:  01/18/2012 7:32 AM  [name]  has presented today for surgery, with the diagnosis of infected Total knee arthroplasty  The various methods of treatment have been discussed with the patient and family. After consideration of risks, benefits and other options for treatment, the patient has consented to  Procedure(s) (LRB): EXCISIONAL TOTAL KNEE ARTHROPLASTY WITH ANTIBIOTIC SPACERS (Right) as a surgical intervention .  The patients' history has been reviewed, patient examined, no change in status, stable for surgery.  I have reviewed the patients' chart and labs.  Questions were answered to the patient's satisfaction.     Thuan Tippett JR,W D

## 2012-01-18 NOTE — Op Note (Signed)
NAMEMARIO, VOONG NO.:  [id number]  MEDICAL RECORD NO.:  [id number]  LOCATION:                                 FACILITY:  PHYSICIAN:  Dyke Brackett, M.D.    DATE OF BIRTH:  [birth year]  DATE OF PROCEDURE:  01/18/2012 DATE OF DISCHARGE:                              OPERATIVE REPORT   INDICATIONS:  This is an 78 year old, who presented in the office yesterday with purulent infection of her total knee having previously undergone a poly exchange with long-term antibiotics.  She had been off antibiotics approximately 2 weeks, and basically relapsed with increasing pain around the knee with gross plus, none of the cultures were pending.  She had gross purulent material withdrawn from the knee yesterday in the office and was prepared for excision arthroplasty with replacement of antibiotic spacer.  PREOPERATIVE DIAGNOSIS:  Pyarthrosis right total knee.  POSTOP DIAGNOSIS:  Pyarthrosis right total knee.  OPERATION: 1. Removal of right total knee. 2. Irrigation and debridement. 3. Insertion of antibiotic spacer with Stimulan beads.  SURGEON:  Dyke Brackett, M.D.  CO-SURGEONS:  Dr. Sherlean Foot with the addition of Margart Sickles, PA-as an assistant.  DESCRIPTION OF PROCEDURE:  The patient was sterilely prepped and draped. We did not exsanguinate the  leg.  We inflated the tourniquet to 375. We placed the knee through the midline incision with medial parapatellar approach to the knee made.  We truncated the tibial bearing, removed it. We then due to use of osteotomes and small oscillating saw circumferentially went around the femur and tibia.  We took the femur outburst with extractor once we disarticulated some of the cement from the bone.  Bone quality given the patient's advanced age was still reasonably good.  There was some condylar bone loss, but we did have some reasonable bone structure left on the condyles.  We transected the patella with 3 pegs and then  drilled out the 3 poly pegs.  Then, attention was next directed to tibia which was removed as well to the use of oscillating saw and osteotomes.  Once the components were removed, and prior to this, we did a complete synovectomy of the knee. We did encounter purulent material and sent a culture.  Aggressive synovectomy was carried out as well as debridement of nonviable bone soft tissue throughout the whole knee.  We then irrigated out with greater than 3000 mL of fluid.  We fashioned an antibiotic spacer with gentamicin impregnated cement 1 g with 1 batch on the tibia and 2 batches on the femur. We made a thermal mold to create a little bit more of an anatomic construct.  We allowed this to harden in the doughy state trying to make some articulation, although it looked at the end of the case that the articulation and movement was occurring at the tibia with cement interface.  We did allow for about 15-20 degrees of flexion of the knee, possibly to allow ambulation in a knee immobilizer.  We then placed Stimulan beads again with 6 batches of gentamicin for 3 batches of Stimulan beads, put these in the gutter due to capsule.  We closed the wound with interrupted #  1 PDS, 2-0 PDS and skin clips, Marcaine with epinephrine, and skin and placed a Hemovac drain exiting superolaterally.  The tourniquet was released after application of dressings.     Dyke Brackett, M.D.     WDC/MEDQ  D:  01/18/2012  T:  01/18/2012  Job:  [id number]

## 2012-01-18 NOTE — Anesthesia Procedure Notes (Signed)
Procedure Name: Intubation Date/Time: 01/18/2012 7:53 AM Performed by: Marni Griffon Pre-anesthesia Checklist: Patient identified, Emergency Drugs available, Suction available and Patient being monitored Patient Re-evaluated:Patient Re-evaluated prior to inductionOxygen Delivery Method: Circle system utilized Preoxygenation: Pre-oxygenation with 100% oxygen Intubation Type: IV induction Ventilation: Mask ventilation without difficulty Grade View: Grade II Tube type: Oral Tube size: 7.5 mm Number of attempts: 1 Airway Equipment and Method: Stylet Placement Confirmation: ETT inserted through vocal cords under direct vision,  breath sounds checked- equal and bilateral and positive ETCO2 Secured at: 21 (cm at teeth) cm Tube secured with: Tape Dental Injury: Teeth and Oropharynx as per pre-operative assessment

## 2012-01-18 NOTE — Brief Op Note (Signed)
01/17/2012 - 01/18/2012  10:19 AM  PATIENT:  [name]  78 y.o. female  PRE-OPERATIVE DIAGNOSIS:  infected Total knee arthroplasty  POST-OPERATIVE DIAGNOSIS:  infected Total knee arthroplasty  PROCEDURE:  Procedure(s) (LRB): EXCISIONAL TOTAL KNEE ARTHROPLASTY WITH ANTIBIOTIC SPACERS (Right)  SURGEON:  Surgeon(s) and Role:    * W D Caffrey Jr., MD - Primary  PHYSICIAN ASSISTANT: Ivin Booty A. Lagretta Loseke, PA-C  ASSISTANTS: Darcella Cheshire, MD   ANESTHESIA:   local and general  EBL:  Total I/O In: 350 [Blood:350] Out: 700 [Urine:650; Blood:50]  BLOOD ADMINISTERED:1 unit CC PRBC  DRAINS: Hemovac drain, right knee, self suction  LOCAL MEDICATIONS USED:  MARCAINE     SPECIMEN:  Source of Specimen:  right knee joint fluid  DISPOSITION OF SPECIMEN:  microbiology  COUNTS:  YES  TOURNIQUET:   Total Tourniquet Time Documented: Thigh (Right) - 118 minutes  DICTATION: .Other Dictation: Dictation Number   PLAN OF CARE: Admit to inpatient   PATIENT DISPOSITION:  PACU - hemodynamically stable.   Delay start of Pharmacological VTE agent (>24hrs) due to surgical blood loss or risk of bleeding: yes

## 2012-01-18 NOTE — Op Note (Signed)
Dictated # [id number]

## 2012-01-18 NOTE — H&P (View-Only) (Signed)
[name] is an 78 y.o. female.   Chief Complaint: Infected Right Total Knee Replacement HPI: 78yo female PMH HTN, thyroid cancer, anemia, chronic pain also with recent admission for hypokalemia and lower extremity edema and allergic reactions to abx, complications s/p right TKA requiring I&D with poly exchange and extended IV antibiotic treatment 11/10/2011. Last set of labwork received in office from 12/18/2011 showed normal sed rate, elevated CRP, normal WBC count.  She has had complications since the last surgical procedure including significant RLE edema, allergic reactions to IV abx (vancomycin) after completing 4 weeks of 6 week course, ID had followed and changed her to IV daptomycin until she had reaction of swollen eyelids and itchy face and it was stopped.  She presents to the outpatient office today with continued swelling and pain in the right knee. We aspirated knee in office today showing gross pus.  Patient not appearing septic and denies any fevers, chills, sweats, erythema.    Past Medical History  Diagnosis Date  . Angina   . Headache   . Hypoglycemia   . Fracture 2008    back  . Macular degeneration of both eyes   . Glaucoma   . Pneumonia   . Shortness of breath 11/08/11    "here lately, all the time"  . Hypothyroidism   . Blood transfusion   . Anemia   . GERD (gastroesophageal reflux disease)   . H/O hiatal hernia   . Arthritis   . Chronic back pain   . Thyroid cancer 1986    Past Surgical History  Procedure Date  . Thyroidectomy   . Shoulder arthroscopy w/ rotator cuff repair   . Trigger finger release     right  thumb  . Abdominal hysterectomy 1963  . Carpal tunnel release     bilaterally  . Tonsillectomy and adenoidectomy   . Appendectomy   . Dilation and curettage of uterus   . Cataract extraction w/ intraocular lens  implant, bilateral   . Joint replacement 2005, 2006, 2012, 11/10/11    left; left; right; right; right  . Goiter removed     "when I  was young"  . Fracture surgery     "broke back"  . Back surgery     "3 back OR's"  . Hand or 09/2011    "left hand; bones were coming together"  . I&d knee with poly exchange 11/10/2011    Procedure: IRRIGATION AND DEBRIDEMENT KNEE WITH POLY EXCHANGE;  Surgeon: W D Caffrey Jr., MD;  Location: [facility] OR;  Service: Orthopedics;  Laterality: Right;  . Post polyethelene exchange 11/09/11    RLE    No family history on file. Social History:  reports that she has never smoked. She has never used smokeless tobacco. She reports that she does not drink alcohol or use illicit drugs.  Allergies:  Allergies  Allergen Reactions  . Demerol Shortness Of Breath  . Lorazepam Anxiety  . Penicillins Swelling    "in the face"  . Tetracyclines & Related Rash  . Vancomycin Rash     (Not in a hospital admission)  No results found for this or any previous visit (from the past 48 hour(s)). No results found.  Review of Systems  Constitutional: Negative for fever, chills and weight loss.  HENT: Negative for congestion and sore throat.   Eyes: Negative for blurred vision and pain.  Respiratory: Negative for cough, hemoptysis, sputum production, shortness of breath and wheezing.   Cardiovascular: Positive for leg   swelling. Negative for chest pain, palpitations and claudication.  Gastrointestinal: Negative for heartburn, nausea, vomiting, abdominal pain, diarrhea and constipation.  Genitourinary: Negative for dysuria.  Musculoskeletal: Positive for myalgias, back pain and joint pain. Negative for falls.  Skin: Negative for itching and rash.  Neurological: Positive for headaches. Negative for dizziness, sensory change and focal weakness.    There were no vitals taken for this visit. Physical Exam  Constitutional: She is oriented to person, place, and time. She appears well-developed. She is cooperative.  HENT:  Head: Normocephalic and atraumatic.  Eyes: Conjunctivae and EOM are normal. Pupils are equal,  round, and reactive to light. Right eye exhibits no discharge. Left eye exhibits no discharge.  Neck: Normal range of motion. Neck supple.  Cardiovascular: Normal rate, regular rhythm and normal heart sounds.        Distal pulses difficult to palpate RLE secondary to significant edema, otherwise distal pulses intact, capillary refill intact  Respiratory: Effort normal and breath sounds normal. No respiratory distress. She has no wheezes. She exhibits no tenderness.  GI: Soft. Bowel sounds are normal. She exhibits no distension. There is no tenderness. There is no guarding.  Musculoskeletal: She exhibits edema and tenderness.       Right hip: She exhibits swelling.       Right knee: She exhibits decreased range of motion, swelling and effusion. tenderness found.       Right ankle: She exhibits swelling.  Lymphadenopathy:    She has no cervical adenopathy.  Neurological: She is alert and oriented to person, place, and time. No sensory deficit.  Skin: Skin is intact. No rash noted.  Psychiatric: She has a normal mood and affect.     Assessment/Plan Infected Right Total Knee Replacement  We discussed in length the risks of continued infection to the right knee replacement including sepsis, or even possibility of osteomyelitis which if not treated could eventually lead to amputation or death.  The infection seems to have only been suppressed with poly exchange and IV abx and now that she is off abx presents with gross pus in the knee.  The knee was aspirated and fluid was sent for culture and gram stain, cell count.  We feel she is in need of repeat I&D, removal of total knee replacement, and placement of antibiotic spacer which could potentially be definitive treatment.  Risks and benefits of the procedure were discussed and the patient wishes to proceed.  She will be directly admitted to [facility] today to the orthopaedic service (Dr. Caffrey), we would like to consult ID as well as the  hospitalist for antibiotic recommendations as well as assistance with medical management.  We will obtain preoperative labs and studies as needed, plan for surgery tomorrow morning, NPO after midnight.  We would like to get her started on abx tonight but will need to discuss with ID first for recommendations due to multiple allergies.    Megann Easterwood 01/17/2012, 12:34 PM    

## 2012-01-18 NOTE — Transfer of Care (Signed)
Immediate Anesthesia Transfer of Care Note  Patient: [name]  Procedure(s) Performed: Procedure(s) (LRB): EXCISIONAL TOTAL KNEE ARTHROPLASTY WITH ANTIBIOTIC SPACERS (Right)  Patient Location: PACU  Anesthesia Type: General  Level of Consciousness: awake, sedated and patient cooperative  Airway & Oxygen Therapy: Patient Spontanous Breathing and Patient connected to nasal cannula oxygen  Post-op Assessment: Report given to PACU RN and Post -op Vital signs reviewed and stable  Post vital signs: Reviewed and stable  Complications: No apparent anesthesia complications

## 2012-01-18 NOTE — Anesthesia Preprocedure Evaluation (Addendum)
Anesthesia Evaluation  Patient identified by MRN, date of birth, ID band Patient awake    Reviewed: Allergy & Precautions, H&P , NPO status , Patient's Chart, lab work & pertinent test results, reviewed documented beta blocker date and time   Airway Mallampati: II TM Distance: >3 FB Neck ROM: full    Dental  (+) Teeth Intact   Pulmonary shortness of breath and with exertion, pneumonia ,          Cardiovascular hypertension, On Medications and On Home Beta Blockers + angina     Neuro/Psych  Headaches, negative psych ROS   GI/Hepatic negative GI ROS, Neg liver ROS, hiatal hernia, GERD-  ,  Endo/Other  negative endocrine ROSHypothyroidism   Renal/GU negative Renal ROS  negative genitourinary   Musculoskeletal   Abdominal   Peds  Hematology negative hematology ROS (+)   Anesthesia Other Findings See surgeon's H&P .  Slightly "buck" upper teeth.  Reproductive/Obstetrics negative OB ROS                         Anesthesia Physical Anesthesia Plan  ASA: III  Anesthesia Plan: General   Post-op Pain Management:    Induction: Intravenous  Airway Management Planned: Oral ETT  Additional Equipment: Arterial line  Intra-op Plan:   Post-operative Plan: Extubation in OR  Informed Consent: I have reviewed the patients History and Physical, chart, labs and discussed the procedure including the risks, benefits and alternatives for the proposed anesthesia with the patient or authorized representative who has indicated his/her understanding and acceptance.   Dental Advisory Given  Plan Discussed with: CRNA and Surgeon  Anesthesia Plan Comments:         Anesthesia Quick Evaluation

## 2012-01-18 NOTE — Preoperative (Signed)
Beta Blockers   Reason not to administer Beta Blockers:Morning dose of Inderal not due this early; to evaluate need intra-op

## 2012-01-18 NOTE — Progress Notes (Signed)
Subjective: Patient seen in PACU, she denies dyspnea, relates right knee pain.  Objective: Filed Vitals:   01/18/12 1200 01/18/12 1215 01/18/12 1230 01/18/12 1245  BP: 110/51  105/99   Pulse: 66 64 66 64  Temp: 97.4 F (36.3 C)     TempSrc:      Resp: 19 17 14 12   SpO2: 100% 100% 98% 100%   Weight change:    General: Alert, awake, oriented x3, in no acute distress.  HEENT: No bruits, no goiter.  Heart: Regular rate and rhythm, without murmurs, rubs, gallops.  Lungs: CTA, bilateral air movement.  Abdomen: Soft, nontender, nondistended, positive bowel sounds.  Neuro: Grossly intact, nonfocal. Extremities; right knee with dressing, drainage.   Lab Results:  [facility] 01/18/12 0511 01/17/12 1719  NA 134* 133*  K 3.1* 2.5*  CL 90* 85*  CO2 36* 38*  GLUCOSE 110* 128*  BUN 13 17  CREATININE 0.90 1.00  CALCIUM 8.2* 8.5  MG 2.1 --  PHOS -- --    Basename 01/18/12 0511 01/17/12 1719  AST 16 17  ALT 8 10  ALKPHOS 73 85  BILITOT 0.4 0.4  PROT 5.7* 6.2  ALBUMIN 2.4* 2.7*    Basename 01/18/12 0511 01/17/12 1719  WBC 5.7 7.0  NEUTROABS 3.3 4.5  HGB 8.2* 8.5*  HCT 25.8* 26.5*  MCV 89.9 89.2  PLT 366 368   Micro Results: Recent Results (from the past 240 hour(s))  SURGICAL PCR SCREEN     Status: Normal   Collection Time   01/18/12  4:01 AM      Component Value Range Status Comment   MRSA, PCR NEGATIVE  NEGATIVE  Final    Staphylococcus aureus NEGATIVE  NEGATIVE  Final     Studies/Results: Dg Chest Portable 1 View  01/17/2012  *RADIOLOGY REPORT*  Clinical Data: Preoperative right knee assessment.  Prior thyroidectomy.  History of angina.  Chronic back pain.  PORTABLE CHEST - 1 VIEW  Comparison: 12/19/2011  Findings: The patient is rotated to the right on today's exam, resulting in reduced diagnostic sensitivity and specificity. Considerable thoracic spondylosis is present.  Scarring noted in the lingula and right middle lobe.  Heart size is within normal limits.   IMPRESSION:  1.  Thoracic spondylosis. 2.  Mild scarring in the lingula and right middle lobe.  Original Report Authenticated By: Dellia Cloud, M.D.    Medications: I have reviewed the patient's current medications.  1. Hypokalemia probably from diuretics - will give 10 meq IV times 3 runs.  2. Normocytic and normochromic anemia - Patient received 1 unit PRBC prior to surgery.  3. Chronic lower extremity edema - patient's edema is more on the right upper extremity than the left. Patient had a Doppler in March 2013 which was negative for DVT. Doppler of lower extremity to rule out DVT. Patient is on diuretics for the same which we'll continue.  4. Postsurgical hypothyroidism - continue Synthroid.  5. Infected prosthetic right knee - further recommendations per infectious disease consultant and orthopedics.  6. Hypertension - continue diuretics.     LOS: 1 day   Saabir Blyth M.D.  [facility] 01/18/2012, 2:54 PM

## 2012-01-19 DIAGNOSIS — D62 Acute posthemorrhagic anemia: Secondary | ICD-10-CM | POA: Diagnosis not present

## 2012-01-19 DIAGNOSIS — M7989 Other specified soft tissue disorders: Secondary | ICD-10-CM | POA: Diagnosis not present

## 2012-01-19 DIAGNOSIS — R609 Edema, unspecified: Secondary | ICD-10-CM

## 2012-01-19 DIAGNOSIS — E876 Hypokalemia: Secondary | ICD-10-CM | POA: Diagnosis present

## 2012-01-19 DIAGNOSIS — R112 Nausea with vomiting, unspecified: Secondary | ICD-10-CM | POA: Diagnosis not present

## 2012-01-19 LAB — CBC
Hemoglobin: 9.1 g/dL — ABNORMAL LOW (ref 12.0–15.0)
MCH: 28.9 pg (ref 26.0–34.0)
MCV: 89.5 fL (ref 78.0–100.0)
Platelets: 386 10*3/uL (ref 150–400)
RBC: 3.15 MIL/uL — ABNORMAL LOW (ref 3.87–5.11)
WBC: 7.1 10*3/uL (ref 4.0–10.5)

## 2012-01-19 LAB — BASIC METABOLIC PANEL
CO2: 33 mEq/L — ABNORMAL HIGH (ref 19–32)
Calcium: 8.9 mg/dL (ref 8.4–10.5)
Chloride: 91 mEq/L — ABNORMAL LOW (ref 96–112)
Creatinine, Ser: 0.86 mg/dL (ref 0.50–1.10)
Glucose, Bld: 133 mg/dL — ABNORMAL HIGH (ref 70–99)
Sodium: 135 mEq/L (ref 135–145)

## 2012-01-19 LAB — URINE CULTURE: Culture  Setup Time: 201305170925

## 2012-01-19 MED ORDER — LEVOFLOXACIN 750 MG PO TABS
750.0000 mg | ORAL_TABLET | ORAL | Status: DC
  Administered 2012-01-19 – 2012-01-21 (×2): 750 mg via ORAL
  Filled 2012-01-19 (×2): qty 1

## 2012-01-19 MED ORDER — TORSEMIDE 20 MG PO TABS
20.0000 mg | ORAL_TABLET | Freq: Every day | ORAL | Status: DC
  Administered 2012-01-19 – 2012-01-20 (×2): 20 mg via ORAL
  Filled 2012-01-19 (×2): qty 1

## 2012-01-19 MED ORDER — OXYCODONE HCL 20 MG PO TB12: 40.0000 mg | ORAL_TABLET | Freq: Two times a day (BID) | ORAL | Status: DC

## 2012-01-19 MED ORDER — PROPRANOLOL HCL 20 MG PO TABS
20.0000 mg | ORAL_TABLET | Freq: Every day | ORAL | Status: DC
  Filled 2012-01-19 (×2): qty 1

## 2012-01-19 MED ORDER — POTASSIUM CHLORIDE CRYS ER 20 MEQ PO TBCR
20.0000 meq | EXTENDED_RELEASE_TABLET | Freq: Once | ORAL | Status: AC
  Administered 2012-01-19: 20 meq via ORAL

## 2012-01-19 MED ORDER — OXYCODONE HCL 5 MG PO TABS: 15.0000 mg | ORAL_TABLET | ORAL | Status: DC | PRN

## 2012-01-19 MED ORDER — ENSURE COMPLETE PO LIQD
237.0000 mL | Freq: Two times a day (BID) | ORAL | Status: DC
  Administered 2012-01-19 – 2012-01-22 (×6): 237 mL via ORAL

## 2012-01-19 MED ORDER — SODIUM CHLORIDE 0.9 % IV BOLUS (SEPSIS)
500.0000 mL | Freq: Once | INTRAVENOUS | Status: AC
  Administered 2012-01-19: 500 mL via INTRAVENOUS

## 2012-01-19 MED ORDER — POTASSIUM CHLORIDE CRYS ER 20 MEQ PO TBCR
40.0000 meq | EXTENDED_RELEASE_TABLET | Freq: Every day | ORAL | Status: DC
  Administered 2012-01-19 – 2012-01-20 (×2): 40 meq via ORAL
  Filled 2012-01-19 (×3): qty 2

## 2012-01-19 MED ORDER — POLYETHYLENE GLYCOL 3350 17 G PO PACK
17.0000 g | PACK | Freq: Every day | ORAL | Status: DC
  Administered 2012-01-19 – 2012-01-20 (×2): 17 g via ORAL
  Filled 2012-01-19 (×2): qty 1

## 2012-01-19 MED ORDER — SODIUM CHLORIDE 0.9 % IV SOLN
INTRAVENOUS | Status: DC
  Administered 2012-01-19 – 2012-01-20 (×3): via INTRAVENOUS
  Filled 2012-01-19 (×5): qty 1000

## 2012-01-19 MED ORDER — POLYETHYLENE GLYCOL 3350 17 G PO PACK
17.0000 g | PACK | Freq: Two times a day (BID) | ORAL | Status: DC
Start: 2012-01-19 — End: 2012-01-19
  Filled 2012-01-19 (×2): qty 1

## 2012-01-19 MED ORDER — HYDROMORPHONE HCL PF 1 MG/ML IJ SOLN
0.5000 mg | INTRAMUSCULAR | Status: DC | PRN
  Administered 2012-01-19 – 2012-01-22 (×14): 1 mg via INTRAVENOUS
  Filled 2012-01-19 (×15): qty 1

## 2012-01-19 MED ORDER — OXYCODONE HCL 5 MG PO TABS
15.0000 mg | ORAL_TABLET | ORAL | Status: DC | PRN
  Administered 2012-01-19 – 2012-01-21 (×9): 15 mg via ORAL
  Administered 2012-01-21: 5 mg via ORAL
  Administered 2012-01-21: 15 mg via ORAL
  Administered 2012-01-22: 5 mg via ORAL
  Administered 2012-01-22: 15 mg via ORAL
  Administered 2012-01-22: 10 mg via ORAL
  Administered 2012-01-22: 15 mg via ORAL
  Filled 2012-01-19: qty 2
  Filled 2012-01-19 (×3): qty 3
  Filled 2012-01-19: qty 1
  Filled 2012-01-19 (×10): qty 3

## 2012-01-19 NOTE — Progress Notes (Signed)
Pt received 500 cc bolus of NS @ 1700 unable to document this in the pt intake and output.

## 2012-01-19 NOTE — Progress Notes (Signed)
Patient ID: [name], female   DOB: [birth year], 78 y.o.   MRN: [id number] PATIENT ID: [name]  MRN: [id number]  DOB/AGE:  Nov 15, 1924 / 78 y.o.  1 Day Post-Op Procedure(s) (LRB): EXCISIONAL TOTAL KNEE ARTHROPLASTY WITH ANTIBIOTIC SPACERS (Right)    PROGRESS NOTE Subjective: Patient is alert, oriented, no Nausea, no Vomiting, yes} passing gas, no Bowel Movement. Taking PO yes. Denies SOB, Chest or Calf Pain. Using Incentive Spirometer, PAS in place. Ambulate not yet,  Patient reports pain as 8 on 0-10 scale  .    Objective: Vital signs in last 24 hours: Filed Vitals:   01/18/12 2000 01/19/12 0018 01/19/12 0400 01/19/12 0747  BP: 133/49 113/46 114/91 106/47  Pulse: 82 81  86  Temp: 98.3 F (36.8 C) 99.4 F (37.4 C) 100 F (37.8 C) 99.7 F (37.6 C)  TempSrc: Oral Oral Oral Oral  Resp: 15 17  15   Height:      Weight:      SpO2: 97% 98%  96%      Intake/Output from previous day: I/O last 3 completed shifts: In: 1700 [I.V.:1350; Blood:350] Out: 3425 [Urine:3125; Drains:250; Blood:50]   Intake/Output this shift: Total I/O In: 315 [P.O.:240; I.V.:75] Out: 1050 [Urine:1050]   LABORATORY DATA:  Basename 01/19/12 0340 01/18/12 0511 01/17/12 1719  WBC 7.1 5.7 --  HGB 9.1* 8.2* --  HCT 28.2* 25.8* --  PLT 386 366 --  NA 135 134* --  K 3.0* 3.1* --  CL 91* 90* --  CO2 33* 36* --  BUN 9 13 --  CREATININE 0.86 0.90 --  GLUCOSE 133* 110* --  GLUCAP -- -- --  INR -- -- 1.05  CALCIUM 8.9 -- --   Cultures reincubated for better growth.  No growth on blood cultures.  Examination: Neurologically intact ABD soft Neurovascular intact Sensation intact distally Intact pulses distally Incision: dressing intact.  drain still draining.}  Assessment:   1 Day Post-Op Procedure(s) (LRB): EXCISIONAL TOTAL KNEE ARTHROPLASTY WITH ANTIBIOTIC SPACERS (Right) ADDITIONAL DIAGNOSIS:   Patient Active Problem List  Diagnoses  . Hypertension  . Drug rash  . Prosthetic joint  infection  . GBS (group B streptococcus) infection  . Hx of thyroid cancer  . Bilateral lower extremity edema  . Infection of prosthetic knee joint  . Hypokalemia  . Anemia due to blood loss, acute   Plan: PT/OT 50% WB DVT Prophylaxis: Lovenox Waiting on antibiotics until cultures grow.  Being followed by Dr Algis Liming of Infectious disease.  Will change dressing in AM    Oluwatimileyin Vivier J 01/19/2012, 9:53 AM

## 2012-01-19 NOTE — Progress Notes (Signed)
VASCULAR LAB PRELIMINARY  PRELIMINARY  PRELIMINARY  PRELIMINARY  Right lower extremity venous Doppler completed.    Preliminary report:  No DVT or SVT noted in the right lower extremity.  Sherren Kerns [place], 01/19/2012, 12:49 PM

## 2012-01-19 NOTE — Progress Notes (Signed)
Subjective: Patient complaining of right knee pain, has received 5 minutes ago pain meds.  Denies chest pain or dyspnea.  Objective: Filed Vitals:   01/18/12 2000 01/19/12 0018 01/19/12 0400 01/19/12 0747  BP: 133/49 113/46 114/91 106/47  Pulse: 82 81  86  Temp: 98.3 F (36.8 C) 99.4 F (37.4 C) 100 F (37.8 C) 99.7 F (37.6 C)  TempSrc: Oral Oral Oral Oral  Resp: 15 17  15   Height:      Weight:      SpO2: 97% 98%  96%   Weight change:    General: Alert, awake, oriented x3, in no acute distress.  HEENT: No bruits, no goiter.  Heart: Regular rate and rhythm, without murmurs, rubs, gallops.  Lungs: CTA, bilateral air movement.  Abdomen: Soft, nontender, nondistended, positive bowel sounds.  Neuro: Grossly intact, nonfocal. Extremities: right LE with dressing.   Lab Results:  Basename 01/19/12 0340 01/18/12 0511  NA 135 134*  K 3.0* 3.1*  CL 91* 90*  CO2 33* 36*  GLUCOSE 133* 110*  BUN 9 13  CREATININE 0.86 0.90  CALCIUM 8.9 8.2*  MG -- 2.1  PHOS -- --    Basename 01/18/12 0511 01/17/12 1719  AST 16 17  ALT 8 10  ALKPHOS 73 85  BILITOT 0.4 0.4  PROT 5.7* 6.2  ALBUMIN 2.4* 2.7*   No results found for this basename: LIPASE:2,AMYLASE:2 in the last 72 hours  Basename 01/19/12 0340 01/18/12 0511 01/17/12 1719  WBC 7.1 5.7 --  NEUTROABS -- 3.3 4.5  HGB 9.1* 8.2* --  HCT 28.2* 25.8* --  MCV 89.5 89.9 --  PLT 386 366 --    Micro Results: Recent Results (from the past 240 hour(s))  CULTURE, BLOOD (ROUTINE X 2)     Status: Normal (Preliminary result)   Collection Time   01/17/12  5:19 PM      Component Value Range Status Comment   Specimen Description BLOOD ARM LEFT   Final    Special Requests BOTTLES DRAWN AEROBIC AND ANAEROBIC 10CC   Final    Culture  Setup Time [id number]   Final    Culture     Final    Value:        BLOOD CULTURE RECEIVED NO GROWTH TO DATE CULTURE WILL BE HELD FOR 5 DAYS BEFORE ISSUING A FINAL NEGATIVE REPORT   Report Status PENDING    Incomplete   CULTURE, BLOOD (ROUTINE X 2)     Status: Normal (Preliminary result)   Collection Time   01/17/12  5:37 PM      Component Value Range Status Comment   Specimen Description BLOOD ARM RIGHT   Final    Special Requests BOTTLES DRAWN AEROBIC AND ANAEROBIC 10CC   Final    Culture  Setup Time [id number]   Final    Culture     Final    Value:        BLOOD CULTURE RECEIVED NO GROWTH TO DATE CULTURE WILL BE HELD FOR 5 DAYS BEFORE ISSUING A FINAL NEGATIVE REPORT   Report Status PENDING   Incomplete   SURGICAL PCR SCREEN     Status: Normal   Collection Time   01/18/12  4:01 AM      Component Value Range Status Comment   MRSA, PCR NEGATIVE  NEGATIVE  Final    Staphylococcus aureus NEGATIVE  NEGATIVE  Final   BODY FLUID CULTURE     Status: Normal (Preliminary result)   Collection Time  01/18/12  8:49 AM      Component Value Range Status Comment   Specimen Description SYNOVIAL FLUID KNEE RIGHT   Final    Special Requests FLUID ON SWAB PT ON LEVAQUIN PO   Final    Gram Stain     Final    Value: RARE WBC PRESENT,BOTH PMN AND MONONUCLEAR     NO ORGANISMS SEEN   Culture Culture reincubated for better growth   Final    Report Status PENDING   Incomplete     Studies/Results: Dg Chest Portable 1 View  01/17/2012  *RADIOLOGY REPORT*  Clinical Data: Preoperative right knee assessment.  Prior thyroidectomy.  History of angina.  Chronic back pain.  PORTABLE CHEST - 1 VIEW  Comparison: 12/19/2011  Findings: The patient is rotated to the right on today's exam, resulting in reduced diagnostic sensitivity and specificity. Considerable thoracic spondylosis is present.  Scarring noted in the lingula and right middle lobe.  Heart size is within normal limits.  IMPRESSION:  1.  Thoracic spondylosis. 2.  Mild scarring in the lingula and right middle lobe.  Original Report Authenticated By: Dellia Cloud, M.D.    Medications: I have reviewed the patient's current medications.  1. Hypokalemia  probably from diuretics - Replace with 40 meq po daily and extra dose of 20 meq tonight.  2. Normocytic and normochromic anemia - Patient received 1 unit PRBC prior to surgery. HB stable at 9. Needs outpatient work up for anemia.  3. Chronic lower extremity edema - patient's edema is more on the right upper extremity than the left. Patient had a Doppler in March 2013 which was negative for DVT. Doppler of lower extremity to rule out DVT. Patient is on diuretics for the same which we'll continue.  I will decrease Demadex dose to avoid dehydration.  4. Postsurgical hypothyroidism - continue Synthroid.  5. Infected prosthetic right knee -  S/P OPERATION:  Removal of right total knee, Irrigation and debridement,  Insertion of antibiotic spacer with Stimulan beads on 5-17. Continue with antibiotics per ID recommendation. Follow culture result. Prior culture 11-10-2011 grew Strep Agalactiae. I will add miralax for bowel regimen. Incentive spirometry at bedside.  6. Hypertension - decrease diuretics to avoid dehydration or hypotension. Holder parameters.       LOS: 2 days   Mico Spark M.D.  [facility] 01/19/2012, 8:55 AM

## 2012-01-19 NOTE — Progress Notes (Signed)
ANTIBIOTIC CONSULT NOTE - FOLLOW UP  Pharmacy Consult for Levaquin Indication: prosthetic joint infection  Allergies  Allergen Reactions  . Demerol Shortness Of Breath  . Lorazepam Anxiety  . Penicillins Swelling    "in the face"  . Tetracyclines & Related Rash  . Vancomycin Rash    Patient Measurements: Height: 5\' 3"  (160 cm) Weight: 168 lb 14 oz (76.6 kg) IBW/kg (Calculated) : 52.4   Vital Signs: Temp: 99.7 F (37.6 C) (05/18 0747) Temp src: Oral (05/18 0747) BP: 106/47 mmHg (05/18 0747) Pulse Rate: 86  (05/18 0747) Intake/Output from previous day: 05/17 0701 - 05/18 0700 In: 1700 [I.V.:1350; Blood:350] Out: 2425 [Urine:2125; Drains:250; Blood:50] Intake/Output from this shift: Total I/O In: 315 [P.O.:240; I.V.:75] Out: 1050 [Urine:1050]  Labs:  [facility] 01/19/12 0340 01/18/12 0511 01/17/12 1719  WBC 7.1 5.7 7.0  HGB 9.1* 8.2* 8.5*  PLT 386 366 368  LABCREA -- -- --  CREATININE 0.86 0.90 1.00   Estimated Creatinine Clearance: 47.7 ml/min (by C-G formula based on Cr of 0.86). No results found for this basename: VANCOTROUGH:2,VANCOPEAK:2,VANCORANDOM:2,GENTTROUGH:2,GENTPEAK:2,GENTRANDOM:2,TOBRATROUGH:2,TOBRAPEAK:2,TOBRARND:2,AMIKACINPEAK:2,AMIKACINTROU:2,AMIKACIN:2, in the last 72 hours   Microbiology: Recent Results (from the past 720 hour(s))  CULTURE, BLOOD (ROUTINE X 2)     Status: Normal (Preliminary result)   Collection Time   01/17/12  5:19 PM      Component Value Range Status Comment   Specimen Description BLOOD ARM LEFT   Final    Special Requests BOTTLES DRAWN AEROBIC AND ANAEROBIC 10CC   Final    Culture  Setup Time [id number]   Final    Culture     Final    Value:        BLOOD CULTURE RECEIVED NO GROWTH TO DATE CULTURE WILL BE HELD FOR 5 DAYS BEFORE ISSUING A FINAL NEGATIVE REPORT   Report Status PENDING   Incomplete   CULTURE, BLOOD (ROUTINE X 2)     Status: Normal (Preliminary result)   Collection Time   01/17/12  5:37 PM      Component Value  Range Status Comment   Specimen Description BLOOD ARM RIGHT   Final    Special Requests BOTTLES DRAWN AEROBIC AND ANAEROBIC 10CC   Final    Culture  Setup Time [id number]   Final    Culture     Final    Value:        BLOOD CULTURE RECEIVED NO GROWTH TO DATE CULTURE WILL BE HELD FOR 5 DAYS BEFORE ISSUING A FINAL NEGATIVE REPORT   Report Status PENDING   Incomplete   SURGICAL PCR SCREEN     Status: Normal   Collection Time   01/18/12  4:01 AM      Component Value Range Status Comment   MRSA, PCR NEGATIVE  NEGATIVE  Final    Staphylococcus aureus NEGATIVE  NEGATIVE  Final   URINE CULTURE     Status: Normal   Collection Time   01/18/12  5:11 AM      Component Value Range Status Comment   Specimen Description URINE, CLEAN CATCH   Final    Special Requests NONE   Final    Culture  Setup Time [id number]   Final    Colony Count NO GROWTH   Final    Culture NO GROWTH   Final    Report Status 01/19/2012 FINAL   Final   BODY FLUID CULTURE     Status: Normal (Preliminary result)   Collection Time   01/18/12  8:49 AM  Component Value Range Status Comment   Specimen Description SYNOVIAL FLUID KNEE RIGHT   Final    Special Requests FLUID ON SWAB PT ON LEVAQUIN PO   Final    Gram Stain     Final    Value: RARE WBC PRESENT,BOTH PMN AND MONONUCLEAR     NO ORGANISMS SEEN   Culture Culture reincubated for better growth   Final    Report Status PENDING   Incomplete     Anti-infectives     Start     Dose/Rate Route Frequency Ordered Stop   01/18/12 1830   levofloxacin (LEVAQUIN) IVPB 750 mg        750 mg 100 mL/hr over 90 Minutes Intravenous Every 48 hours 01/18/12 1757     01/18/12 0925   gentamicin (GARAMYCIN) injection  Status:  Discontinued          As needed 01/18/12 0931 01/18/12 1024   01/18/12 0745   levofloxacin (LEVAQUIN) IVPB 500 mg  Status:  Discontinued        500 mg 100 mL/hr over 60 Minutes Intravenous To Surgery 01/18/12 0736 01/18/12 1640           Assessment: Prosthetic joint infection, 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  Per Dr. Daiva Eves she was to begin Levaqin post-operatively (ok for oral antibiotics) but it appears she has not received an antibiotic dose yet (ordered but not charted).  Her creatinine clearance is <50 so she will require an adjusted Levaquin regimen.    Goal of Therapy:  Appropriate antimicrobial therapy  Plan:  Levaquin 750mg  PO q48 Monitor renal function and micro data.  Estella Husk, Pharm.D., BCPS Clinical Pharmacist  Phone [phone] Pager [phone] 01/19/2012, 10:15 AM

## 2012-01-19 NOTE — Progress Notes (Signed)
CSW received referral for SNF placement.  Will f/u after PT/OT recommendations. Milus Banister MSW,LCSW w/e Coverage [phone]

## 2012-01-19 NOTE — Evaluation (Signed)
Physical Therapy Evaluation Patient Details Name: [name] MRN: [id number] DOB: [birth year] Today's Date: 01/19/2012 Time: 4098-1191 PT Time Calculation (min): 17 min  PT Assessment / Plan / Recommendation Clinical Impression  Pt presents s/p infection of prosthetic knee with spacer insertion. Pt with no ROM of RLE with KI on at all times. Pain limits patients mobility. Recommend ST-SNF for safety prior to returning home, pt agreeable. Pt will benefit from skilled PT in the acute care setting in order to improve functional mobility and strength prior to d.c    PT Assessment  Patient needs continued PT services    Follow Up Recommendations  Skilled nursing facility;Supervision/Assistance - 24 hour (SNF vs HH pending progress)    Barriers to Discharge        lEquipment Recommendations  None recommended by PT    Recommendations for Other Services     Frequency Min 6X/week    Precautions / Restrictions Precautions Precautions: Knee Precaution Comments: no ROM, WBAT Required Braces or Orthoses: Knee Immobilizer - Right Knee Immobilizer - Right: On at all times Restrictions Weight Bearing Restrictions: No         Mobility  Bed Mobility Bed Mobility: Supine to Sit;Sitting - Scoot to Edge of Bed Supine to Sit: 3: Mod assist Sitting - Scoot to Edge of Bed: 4: Min assist Details for Bed Mobility Assistance: Mod assist for supine to sit with RLE assist due to pain as well as trunk support. Pain limiting factor for mobility Transfers Transfers: Sit to Stand;Stand to Sit;Stand Pivot Transfers Sit to Stand: 3: Mod assist;With upper extremity assist;From bed Stand to Sit: 3: Mod assist;With upper extremity assist;To chair/3-in-1 Stand Pivot Transfers: 1: +2 Total assist;With armrests Stand Pivot Transfers: Patient Percentage: 60% Details for Transfer Assistance: Mod assist for stability as pt is hesitant to bear weight through RLE as well as increased pain with dependent  position.  Transfer from bed to chair, pt able to take slow steps for transfer although in excrutiating pain with all movement and RLE weightbearing Ambulation/Gait Ambulation/Gait Assistance: Not tested (comment)    Exercises     PT Diagnosis: Difficulty walking;Acute pain  PT Problem List: Decreased strength;Decreased activity tolerance;Decreased mobility;Decreased knowledge of use of DME;Decreased safety awareness;Decreased knowledge of precautions;Decreased balance;Pain PT Treatment Interventions: Gait training;DME instruction;Functional mobility training;Therapeutic activities;Therapeutic exercise;Patient/family education   PT Goals Acute Rehab PT Goals PT Goal Formulation: With patient Time For Goal Achievement: 02/02/12 Potential to Achieve Goals: Fair Pt will go Supine/Side to Sit: with modified independence PT Goal: Supine/Side to Sit - Progress: Goal set today Pt will go Sit to Supine/Side: with modified independence PT Goal: Sit to Supine/Side - Progress: Goal set today Pt will go Sit to Stand: with min assist PT Goal: Sit to Stand - Progress: Goal set today Pt will go Stand to Sit: with min assist PT Goal: Stand to Sit - Progress: Goal set today Pt will Transfer Bed to Chair/Chair to Bed: with min assist PT Transfer Goal: Bed to Chair/Chair to Bed - Progress: Goal set today Pt will Ambulate: 16 - 50 feet;with mod assist;with rolling walker PT Goal: Ambulate - Progress: Goal set today  Visit Information  Last PT Received On: 01/19/12 Assistance Needed: +2    Subjective Data      Prior Functioning  Home Living Lives With: Spouse Available Help at Discharge: Family;Available 24 hours/day Type of Home: House Home Access: Stairs to enter [employer] of Steps: 2 Entrance Stairs-Rails: Can reach both Home Layout: One  level Bathroom Shower/Tub: Walk-in shower;Door [employer] Toilet: Standard Bathroom Accessibility: Yes How Accessible: Accessible via  walker Home Adaptive Equipment: Walker - rolling;Wheelchair - manual;Straight cane;Shower chair with back;Bedside commode/3-in-1;Hand-held shower hose;Grab bars around toilet;Grab bars in shower Prior Function Level of Independence: Independent with assistive device(s) Able to Take Stairs?: Yes Driving: No Vocation: Retired Musician: No difficulties Dominant Hand: Right    Cognition  Overall Cognitive Status: Appears within functional limits for tasks assessed/performed Arousal/Alertness: Awake/alert Orientation Level: Appears intact for tasks assessed Behavior During Session: [facility] for tasks performed    Extremity/Trunk Assessment Right Lower Extremity Assessment RLE ROM/Strength/Tone: Unable to fully assess;Due to precautions;Due to pain RLE Sensation: WFL - Light Touch Left Lower Extremity Assessment LLE ROM/Strength/Tone: Within functional levels LLE Sensation: WFL - Light Touch   Balance    End of Session PT - End of Session Equipment Utilized During Treatment: Gait belt;Right knee immobilizer Activity Tolerance: Patient limited by pain Patient left: in chair;with call bell/phone within reach Nurse Communication: Mobility status   Milana Kidney 01/19/2012, 3:09 PM  01/19/2012 Milana Kidney DPT PAGER: [phone] OFFICE: [phone]

## 2012-01-20 DIAGNOSIS — R112 Nausea with vomiting, unspecified: Secondary | ICD-10-CM | POA: Diagnosis not present

## 2012-01-20 DIAGNOSIS — M7989 Other specified soft tissue disorders: Secondary | ICD-10-CM | POA: Diagnosis not present

## 2012-01-20 DIAGNOSIS — E876 Hypokalemia: Secondary | ICD-10-CM | POA: Diagnosis not present

## 2012-01-20 LAB — BASIC METABOLIC PANEL
BUN: 11 mg/dL (ref 6–23)
CO2: 34 mEq/L — ABNORMAL HIGH (ref 19–32)
Calcium: 8.8 mg/dL (ref 8.4–10.5)
Chloride: 91 mEq/L — ABNORMAL LOW (ref 96–112)
Creatinine, Ser: 0.99 mg/dL (ref 0.50–1.10)
Glucose, Bld: 131 mg/dL — ABNORMAL HIGH (ref 70–99)

## 2012-01-20 LAB — CBC
HCT: 25.1 % — ABNORMAL LOW (ref 36.0–46.0)
MCH: 28.8 pg (ref 26.0–34.0)
MCV: 90.3 fL (ref 78.0–100.0)
RBC: 2.78 MIL/uL — ABNORMAL LOW (ref 3.87–5.11)
WBC: 5.7 10*3/uL (ref 4.0–10.5)

## 2012-01-20 MED ORDER — POLYETHYLENE GLYCOL 3350 17 G PO PACK
17.0000 g | PACK | Freq: Two times a day (BID) | ORAL | Status: DC
  Administered 2012-01-20: 17 g via ORAL
  Filled 2012-01-20 (×3): qty 1

## 2012-01-20 MED ORDER — POTASSIUM CHLORIDE CRYS ER 20 MEQ PO TBCR
20.0000 meq | EXTENDED_RELEASE_TABLET | Freq: Once | ORAL | Status: AC
  Administered 2012-01-20: 20 meq via ORAL
  Filled 2012-01-20: qty 1

## 2012-01-20 MED ORDER — FERROUS SULFATE 325 (65 FE) MG PO TABS
325.0000 mg | ORAL_TABLET | Freq: Two times a day (BID) | ORAL | Status: DC
  Filled 2012-01-20 (×3): qty 1

## 2012-01-20 MED ORDER — BISACODYL 10 MG RE SUPP
10.0000 mg | Freq: Once | RECTAL | Status: AC
  Administered 2012-01-20: 10 mg via RECTAL
  Filled 2012-01-20: qty 1

## 2012-01-20 MED ORDER — FUROSEMIDE 10 MG/ML IJ SOLN
20.0000 mg | Freq: Once | INTRAMUSCULAR | Status: AC
  Administered 2012-01-20: 20 mg via INTRAVENOUS

## 2012-01-20 NOTE — Progress Notes (Signed)
Patient ID: ZELENA BUSHONG, female   DOB: 1924-11-19, 78 y.o.   MRN: [id number] PATIENT ID: [name]  MRN: [id number]  DOB/AGE:  October 19, 1924 / 78 y.o.  2 Days Post-Op Procedure(s) (LRB): EXCISIONAL TOTAL KNEE ARTHROPLASTY WITH ANTIBIOTIC SPACERS (Right)    PROGRESS NOTE Subjective: Patient is alert, oriented, no Nausea, no Vomiting, yes} passing gas, no Bowel Movement. Taking PO yes. Denies SOB, Chest or Calf Pain. Using Incentive Spirometer, PAS in place. Ambulate minimal with significant difficulty,  Patient reports pain as 4 on 0-10 scale  .    Objective: Vital signs in last 24 hours: Filed Vitals:   01/19/12 1951 01/19/12 2300 01/20/12 0300 01/20/12 0736  BP: 97/36 90/42 101/43 92/44  Pulse:  78 78 71  Temp:  98.1 F (36.7 C) 98.5 F (36.9 C) 98.4 F (36.9 C)  TempSrc:  Oral Oral Oral  Resp:  16 12 17   Height:      Weight:      SpO2: 95% 96% 96% 94%      Intake/Output from previous day: I/O last 3 completed shifts: In: 3020 [P.O.:720; I.V.:2300] Out: 5445 [Urine:4775; Drains:670]   Intake/Output this shift: Total I/O In: 540 [P.O.:240; I.V.:300] Out: -    LABORATORY DATA:  Basename 01/20/12 0457 01/19/12 0340 01/17/12 1719  WBC 5.7 7.1 --  HGB 8.0* 9.1* --  HCT 25.1* 28.2* --  PLT 345 386 --  NA 134* 135 --  K 3.6 3.0* --  CL 91* 91* --  CO2 34* 33* --  BUN 11 9 --  CREATININE 0.99 0.86 --  GLUCOSE 131* 133* --  GLUCAP -- -- --  INR -- -- 1.05  CALCIUM 8.8 -- --   cultures being re incubated for better growth.   Examination: Neurologically intact ABD soft Neurovascular intact Sensation intact distally Intact pulses distally Dorsiflexion/Plantar flexion intact Incision: dressing changed by me today.  drain removed.  wound well approximated with minimal drainage.  much less swelling per patient and her family}  Assessment:   2 Days Post-Op Procedure(s) (LRB): EXCISIONAL TOTAL KNEE ARTHROPLASTY WITH ANTIBIOTIC SPACERS (Right) ADDITIONAL  DIAGNOSIS:   Patient Active Problem List  Diagnoses  . Hypertension  . Drug rash  . Prosthetic joint infection  . GBS (group B streptococcus) infection  . Hx of thyroid cancer  . Bilateral lower extremity edema  . Infection of prosthetic knee joint  . Hypokalemia  . Anemia due to blood loss, acute    Plan: PT/OT in knee immobilizer DVT Prophylaxis:  lovenox DISCHARGE PLAN: Home DISCHARGE NEEDS: HHPT and HHRN Hypokalemia resolved.  Acute blood loss anemia being treated today with transfusion of two units of packed RBC with 20 mg lasix between units.  Will supplement potassium today due to lasix being given during transfusion.  Dulcolax suppository for constipation.  Foley to be D/C'd at 6 am.  Will transfer to 5000 now that patient is less hypotensive.    Saba Neuman J 01/20/2012, 11:59 AM

## 2012-01-20 NOTE — Progress Notes (Signed)
Pt transferred to 5028 per MD order. Report called to receiving nurse and all questions answered. Family aware of transfer.

## 2012-01-20 NOTE — Progress Notes (Signed)
Patient ID: [name], female   DOB: Jan 20, 1925, 78 y.o.   MRN: [id number] Patient is stable following 2 days in ICU for hypotension and anemia.  She has responded well to fluid bolus and 1 unit of blood.  Still anemia.  Will get 2 units of packed red blood cells today and will be transferred to orthopedics

## 2012-01-20 NOTE — Progress Notes (Signed)
Clinical Social Work Department BRIEF PSYCHOSOCIAL ASSESSMENT 01/20/2012  Patient:  EUFELIA, VENO     Account Number:  [id number]     Admit date:  01/17/2012  Clinical Social Worker:  Noel Journey, CLINICAL SOCIAL WORKER  Date/Time:  01/20/2012 11:38 AM  Referred by:  RN  Date Referred:  01/20/2012 Referred for  SNF Placement   Other Referral:   Interview type:  Patient Other interview type:   husband, dtr and PA were in roomert P    PSYCHOSOCIAL DATA Living Status:  HUSBAND Admitted from facility:   Level of care:   Primary support name:  Collen Hostler Primary support relationship to patient:  SPOUSE Degree of support available:   strong    CURRENT CONCERNS Current Concerns  Post-Acute Placement   Other Concerns:    SOCIAL WORK ASSESSMENT / PLAN CSW met with pt, during assessement, husband dtr and PA came.  Pt would prefer to return home, with husband who provided care at her knee replacement. PA and Dtr did not verbalize concerns with this plan.  CSW informed, pending porgression, PT may feel comfortable with plan of returning home with HHPT.   Assessment/plan status:  Psychosocial Support/Ongoing Assessment of Needs Other assessment/ plan:   Information/referral to community resources:    PATIENT'S/FAMILY'S RESPONSE TO PLAN OF CARE: Family verbalized appreciation for CSW understanding of preference.  Pt anxious to return home.  CSW continue to follow PT recommendations.  RN informed.i

## 2012-01-20 NOTE — Progress Notes (Signed)
Physical Therapy Treatment Patient Details Name: [name] MRN: [id number] DOB: [birth year] Today's Date: 01/20/2012 Time: 4098-1191 PT Time Calculation (min): 32 min  PT Assessment / Plan / Recommendation Comments on Treatment Session  78 yo s/p right knee I&D, receiving PRBCs and unable to take IV pain med (declined secondary IV site per RN), still agreeable to attempt mobility and able to perform seated and standing mobility without hypotension or increased pain.  Pt desires to return home at d/c, however at this point in time I am hesitant to recommend that...did encourage patient and spouse to consider alternatives if she is not able to achieve adequate mobility to safely access and function in her home, and that therapy will continue to work with her to meet her d/c goal.  Expect that once she is more medically stable she will recover physically and be able to manage a home d/c.    Follow Up Recommendations  Skilled nursing facility;Supervision/Assistance - 24 hour    Barriers to Discharge        Equipment Recommendations  None recommended by PT    Recommendations for Other Services    Frequency Min 6X/week   Plan Discharge plan remains appropriate;Frequency remains appropriate    Precautions / Restrictions Precautions Precautions: Knee Precaution Booklet Issued: No Precaution Comments: Knee immobilizer for walking, no knee ROM, WBAT Required Braces or Orthoses: Knee Immobilizer - Right Knee Immobilizer - Right: On at all times Restrictions Weight Bearing Restrictions: Yes RLE Weight Bearing: Weight bearing as tolerated   Pertinent Vitals/Pain Moderate, unable to have IV med as running PRBCs in IV site.    Mobility  Bed Mobility Bed Mobility: Supine to Sit;Sit to Supine;Sitting - Scoot to [place] of Bed;Scooting to HOB Supine to Sit: 3: Mod assist;With rails;HOB elevated Sitting - Scoot to Edge of Bed: 3: Mod assist (with bed pad) Sit to Supine: 3: Mod assist;HOB  flat Scooting to HOB: 3: Mod assist;With rail Details for Bed Mobility Assistance: Generally pt requires assist for managing RLE through all positional changes.  Attempts to use intact LLE for assis to scoot/shift hips, but foot slips.  With left foot anchored, pt is able to manage pulling up in bed, and is able to otherwise transition.  Does require trunk assist to sit up at right EOB Transfers Transfers: Sit to Stand;Stand to Sit Sit to Stand: 3: Mod assist;With upper extremity assist;From elevated surface;From bed Stand to Sit: 4: Min assist;With upper extremity assist;To elevated surface;To bed Stand Pivot Transfers: Not tested (comment) (pt did not want without getting dilaudid) Details for Transfer Assistance: Generally pt able to problem solve hand placement to optimize leverage into standing.  Side stepped 3-4 steps with good sequencing uncued.  Raised bed slightly to improve leverage, and lowered for sitting.  Is able to manage unloading surgical leg prior to sit without physical assistance. Ambulation/Gait Ambulation/Gait Assistance: 4: Min assist Ambulation Distance (Feet): 3 Feet (steps to side) Assistive device: Rolling walker Ambulation/Gait Assistance Details: sequences walker, surgical leg, intact leg uncued Gait Pattern: Step-through pattern Stairs: No Wheelchair Mobility Wheelchair Mobility: No    Exercises     PT Diagnosis:    PT Problem List:   PT Treatment Interventions:     PT Goals Acute Rehab PT Goals PT Goal Formulation: With patient Time For Goal Achievement: 02/02/12 Potential to Achieve Goals: Fair Pt will go Supine/Side to Sit: with modified independence PT Goal: Supine/Side to Sit - Progress: Progressing toward goal Pt will go Sit to Supine/Side:  with modified independence PT Goal: Sit to Supine/Side - Progress: Progressing toward goal Pt will go Sit to Stand: with min assist PT Goal: Sit to Stand - Progress: Progressing toward goal Pt will go Stand  to Sit: with min assist PT Goal: Stand to Sit - Progress: Progressing toward goal Pt will Transfer Bed to Chair/Chair to Bed: with min assist Pt will Ambulate: 16 - 50 feet;with mod assist;with rolling walker PT Goal: Ambulate - Progress: Progressing toward goal  Visit Information  Last PT Received On: 01/20/12 Assistance Needed: +1    Subjective Data  Subjective: Oh honey, I wish I could have my dilaudid before we start. Patient Stated Goal: Go home, take care of poodle   Cognition  Overall Cognitive Status: Appears within functional limits for tasks assessed/performed Arousal/Alertness: Awake/alert Orientation Level: Appears intact for tasks assessed Behavior During Session: [facility] for tasks performed Cognition - Other Comments: mild disorientation as to when transfusion started, but otherwise alert and compliant    Balance  Balance Balance Assessed: No  End of Session PT - End of Session Equipment Utilized During Treatment: Gait belt;Right knee immobilizer Activity Tolerance: Patient limited by pain;Patient limited by fatigue Patient left: in bed;with call bell/phone within reach;with family/visitor present Nurse Communication: Mobility status    Dennis Bast 01/20/2012, 2:27 PM

## 2012-01-20 NOTE — Progress Notes (Signed)
Subjective: Feeling well, no dyspnea. No bowel movement.  Objective: Filed Vitals:   01/19/12 1951 01/19/12 2300 01/20/12 0300 01/20/12 0736  BP: 97/36 90/42 101/43   Pulse:  78 78   Temp:  98.1 F (36.7 C) 98.5 F (36.9 C) 98.4 F (36.9 C)  TempSrc:  Oral Oral Oral  Resp:  16 12   Height:      Weight:      SpO2: 95% 96% 96%    Weight change:    General: Alert, awake, oriented x3, in no acute distress.  HEENT: No bruits, no goiter.  Heart: Regular rate and rhythm, without murmurs, rubs, gallops.  Lungs: CTA, bilateral air movement.  Abdomen: Soft, nontender, nondistended, positive bowel sounds.  Neuro: Grossly intact, nonfocal. Extremities; Right LE with dressing. Plus 2 edema.   Lab Results:  Basename 01/20/12 0457 01/19/12 0340 01/18/12 0511  NA 134* 135 --  K 3.6 3.0* --  CL 91* 91* --  CO2 34* 33* --  GLUCOSE 131* 133* --  BUN 11 9 --  CREATININE 0.99 0.86 --  CALCIUM 8.8 8.9 --  MG -- -- 2.1  PHOS -- -- --    Basename 01/18/12 0511 01/17/12 1719  AST 16 17  ALT 8 10  ALKPHOS 73 85  BILITOT 0.4 0.4  PROT 5.7* 6.2  ALBUMIN 2.4* 2.7*   No results found for this basename: LIPASE:2,AMYLASE:2 in the last 72 hours  Basename 01/20/12 0457 01/19/12 0340 01/18/12 0511 01/17/12 1719  WBC 5.7 7.1 -- --  NEUTROABS -- -- 3.3 4.5  HGB 8.0* 9.1* -- --  HCT 25.1* 28.2* -- --  MCV 90.3 89.5 -- --  PLT 345 386 -- --    Micro Results: Recent Results (from the past 240 hour(s))  CULTURE, BLOOD (ROUTINE X 2)     Status: Normal (Preliminary result)   Collection Time   01/17/12  5:19 PM      Component Value Range Status Comment   Specimen Description BLOOD ARM LEFT   Final    Special Requests BOTTLES DRAWN AEROBIC AND ANAEROBIC 10CC   Final    Culture  Setup Time [id number]   Final    Culture     Final    Value:        BLOOD CULTURE RECEIVED NO GROWTH TO DATE CULTURE WILL BE HELD FOR 5 DAYS BEFORE ISSUING A FINAL NEGATIVE REPORT   Report Status PENDING    Incomplete   CULTURE, BLOOD (ROUTINE X 2)     Status: Normal (Preliminary result)   Collection Time   01/17/12  5:37 PM      Component Value Range Status Comment   Specimen Description BLOOD ARM RIGHT   Final    Special Requests BOTTLES DRAWN AEROBIC AND ANAEROBIC 10CC   Final    Culture  Setup Time [id number]   Final    Culture     Final    Value:        BLOOD CULTURE RECEIVED NO GROWTH TO DATE CULTURE WILL BE HELD FOR 5 DAYS BEFORE ISSUING A FINAL NEGATIVE REPORT   Report Status PENDING   Incomplete   SURGICAL PCR SCREEN     Status: Normal   Collection Time   01/18/12  4:01 AM      Component Value Range Status Comment   MRSA, PCR NEGATIVE  NEGATIVE  Final    Staphylococcus aureus NEGATIVE  NEGATIVE  Final   URINE CULTURE     Status: Normal  Collection Time   01/18/12  5:11 AM      Component Value Range Status Comment   Specimen Description URINE, CLEAN CATCH   Final    Special Requests NONE   Final    Culture  Setup Time [id number]   Final    Colony Count NO GROWTH   Final    Culture NO GROWTH   Final    Report Status 01/19/2012 FINAL   Final   ANAEROBIC CULTURE     Status: Normal (Preliminary result)   Collection Time   01/18/12  8:49 AM      Component Value Range Status Comment   Specimen Description SYNOVIAL FLUID KNEE RIGHT   Final    Special Requests FLUID ON SWAB PT ON LEVAQUIN PO   Final    Gram Stain PENDING   Incomplete    Culture     Final    Value: NO ANAEROBES ISOLATED; CULTURE IN PROGRESS FOR 5 DAYS   Report Status PENDING   Incomplete   BODY FLUID CULTURE     Status: Normal (Preliminary result)   Collection Time   01/18/12  8:49 AM      Component Value Range Status Comment   Specimen Description SYNOVIAL FLUID KNEE RIGHT   Final    Special Requests FLUID ON SWAB PT ON LEVAQUIN PO   Final    Gram Stain     Final    Value: RARE WBC PRESENT,BOTH PMN AND MONONUCLEAR     NO ORGANISMS SEEN   Culture Culture reincubated for better growth   Final    Report  Status PENDING   Incomplete     Studies/Results: No results found.  Medications: I have reviewed the patient's current medications.  1. Hypokalemia probably from diuretics - Resolved. Patient getting potassium with IV fluids. I will discontinue daily supplement.  2. Normocytic and normochromic anemia - Patient received 1 unit PRBC prior to surgery. HB stable at 8. Needs outpatient work up for anemia. 3. Chronic lower extremity edema - patient's edema is more on the right upper extremity than the left. Patient had a Doppler in March 2013 which was negative for DVT. Doppler of lower extremity to rule out DVT during this admission negative.  4. Postsurgical hypothyroidism - continue Synthroid.  5. Infected prosthetic right knee - S/P OPERATION: Removal of right total knee, Irrigation and debridement, Insertion of antibiotic spacer with Stimulan beads on 5-17. Continue with antibiotics per ID recommendation. Follow culture result. Prior culture 11-10-2011 grew Strep Agalactiae.  miralax for bowel regimen. Incentive spirometry at bedside.  6. Hypertension - I will hold BP medications, SBP soft, in setting narcotics and post surgery.  7-Hypotension: continue with IV fluids but monitor for pulmonary edema. Holder parameters for opioids.         LOS: 3 days   Lamiracle Chaidez M.D.  [facility] 01/20/2012, 9:53 AM

## 2012-01-21 DIAGNOSIS — E876 Hypokalemia: Secondary | ICD-10-CM

## 2012-01-21 DIAGNOSIS — R112 Nausea with vomiting, unspecified: Secondary | ICD-10-CM

## 2012-01-21 DIAGNOSIS — M7989 Other specified soft tissue disorders: Secondary | ICD-10-CM

## 2012-01-21 DIAGNOSIS — D649 Anemia, unspecified: Secondary | ICD-10-CM | POA: Diagnosis present

## 2012-01-21 LAB — CBC
Hemoglobin: 11.3 g/dL — ABNORMAL LOW (ref 12.0–15.0)
MCHC: 32.5 g/dL (ref 30.0–36.0)
RBC: 3.91 MIL/uL (ref 3.87–5.11)
WBC: 6.4 10*3/uL (ref 4.0–10.5)

## 2012-01-21 LAB — TYPE AND SCREEN
Antibody Screen: POSITIVE
DAT, IgG: POSITIVE
Donor AG Type: NEGATIVE
PT AG Type: NEGATIVE
Unit division: 0
Unit division: 0
Unit division: 0

## 2012-01-21 LAB — BASIC METABOLIC PANEL
CO2: 33 mEq/L — ABNORMAL HIGH (ref 19–32)
Chloride: 89 mEq/L — ABNORMAL LOW (ref 96–112)
GFR calc non Af Amer: 59 mL/min — ABNORMAL LOW (ref >90)
Glucose, Bld: 139 mg/dL — ABNORMAL HIGH (ref 70–99)
Potassium: 3.8 mEq/L (ref 3.5–5.1)
Sodium: 131 mEq/L — ABNORMAL LOW (ref 135–145)

## 2012-01-21 MED ORDER — OXYCODONE HCL 15 MG PO TB12
15.0000 mg | ORAL_TABLET | Freq: Two times a day (BID) | ORAL | Status: DC
  Administered 2012-01-21 – 2012-01-22 (×2): 15 mg via ORAL
  Filled 2012-01-21: qty 1

## 2012-01-21 NOTE — Progress Notes (Signed)
CARE MANAGEMENT NOTE 01/21/2012  Patient:  [name], [name]   Account Number:  [id number]  Date Initiated:  01/21/2012  Documentation initiated by:  Vance Peper  Subjective/Objective Assessment:   78 yr old female s/p right excisional total knee arthroplasty with antibiotic spacer.     Action/Plan:   Spoke with patient regarding home health needs, states she is going home.SNF was suggested but she is adamant about going home. She has all DME. Choice offered- she wants [facility], she has used them in the past. Lives with husband.   Anticipated DC Date:  01/22/2012   Anticipated DC Plan:  HOME W HOME HEALTH SERVICES      DC Planning Services  CM consult      [facility] Choice  HOME HEALTH   Choice offered to / List presented to:  C-1 Patient        HH arranged  HH-2 PT      [facility] agency  [facility]   Status of service:  Completed, signed off Discharge Disposition:  HOME W HOME HEALTH SERVICES    HOME HEALTH AGENCIES SERVING [place]   Agencies that are Medicare-Certified and are affiliated with [facility]  Telephone Number Address  [facility]   The [facility] has ownership interest in this company; however, you are under no obligation to use this agency. [phone] or  [phone] [address] [place], [place] [address]   Agencies that are Medicare-Certified and are not affiliated with [facility] Agency Telephone Number Address  [facility] [phone] Fax [phone] [address], [address] [place], [place]  [address]  [facility] [phone] or [phone] Fax [phone] [address] [place], [place] [address]  [facility] [phone] Fax [phone] [address] [place], [place] [address]    [facility] [phone] Fax [phone] [address] [place], [place]  [address]  [facility] [phone] Fax [phone] [address], Suite [place], [place] [address]  [facility] [phone] [address] [place], [place] [address]  [facility] [phone]  [address] [place], [place] [address]  [facility] [phone] or [phone] Fax [phone] [address] [place], [place]  [address]  [facility] [phone] Fax [phone] [address] [place], [place]  [address]  [facility]  [phone] Fax [phone] [address] [place], [place] [address]

## 2012-01-21 NOTE — Progress Notes (Signed)
Subjective: Patient sitting in bed in no distress, feeling well. She relates she had 2 large BM.   Objective: Filed Vitals:   01/20/12 1848 01/20/12 2002 01/21/12 0417 01/21/12 1339  BP: 99/55 97/57 121/69 96/49  Pulse: 86 86 71 78  Temp: 98 F (36.7 C) 100.2 F (37.9 C) 98.8 F (37.1 C) 100.1 F (37.8 C)  TempSrc:  Oral Oral Oral  Resp: 16 16 16 18   Height:      Weight:      SpO2:  94% 98% 98%   Weight change:    General: Alert, awake, oriented x3, in no acute distress.  HEENT: No bruits, no goiter.  Heart: Regular rate and rhythm, without murmurs, rubs, gallops.  Lungs: CTA, bilateral air movement.  Abdomen: Soft, nontender, nondistended, positive bowel sounds.  Neuro: Grossly intact, nonfocal. Extremities; Right LE with dressing.  Lab Results:  [facility] 01/21/12 0820 01/20/12 0457  NA 131* 134*  K 3.8 3.6  CL 89* 91*  CO2 33* 34*  GLUCOSE 139* 131*  BUN 10 11  CREATININE 0.87 0.99  CALCIUM 9.4 8.8  MG -- --  PHOS -- --    Basename 01/21/12 0820 01/20/12 0457  WBC 6.4 5.7  NEUTROABS -- --  HGB 11.3* 8.0*  HCT 34.8* 25.1*  MCV 89.0 90.3  PLT 360 345    Micro Results: Recent Results (from the past 240 hour(s))  CULTURE, BLOOD (ROUTINE X 2)     Status: Normal (Preliminary result)   Collection Time   01/17/12  5:19 PM      Component Value Range Status Comment   Specimen Description BLOOD ARM LEFT   Final    Special Requests BOTTLES DRAWN AEROBIC AND ANAEROBIC 10CC   Final    Culture  Setup Time [id number]   Final    Culture     Final    Value:        BLOOD CULTURE RECEIVED NO GROWTH TO DATE CULTURE WILL BE HELD FOR 5 DAYS BEFORE ISSUING A FINAL NEGATIVE REPORT   Report Status PENDING   Incomplete   CULTURE, BLOOD (ROUTINE X 2)     Status: Normal (Preliminary result)   Collection Time   01/17/12  5:37 PM      Component Value Range Status Comment   Specimen Description BLOOD ARM RIGHT   Final    Special Requests BOTTLES DRAWN AEROBIC AND ANAEROBIC  10CC   Final    Culture  Setup Time [id number]   Final    Culture     Final    Value:        BLOOD CULTURE RECEIVED NO GROWTH TO DATE CULTURE WILL BE HELD FOR 5 DAYS BEFORE ISSUING A FINAL NEGATIVE REPORT   Report Status PENDING   Incomplete   SURGICAL PCR SCREEN     Status: Normal   Collection Time   01/18/12  4:01 AM      Component Value Range Status Comment   MRSA, PCR NEGATIVE  NEGATIVE  Final    Staphylococcus aureus NEGATIVE  NEGATIVE  Final   URINE CULTURE     Status: Normal   Collection Time   01/18/12  5:11 AM      Component Value Range Status Comment   Specimen Description URINE, CLEAN CATCH   Final    Special Requests NONE   Final    Culture  Setup Time [id number]   Final    Colony Count NO GROWTH   Final  Culture NO GROWTH   Final    Report Status 01/19/2012 FINAL   Final   ANAEROBIC CULTURE     Status: Normal (Preliminary result)   Collection Time   01/18/12  8:49 AM      Component Value Range Status Comment   Specimen Description SYNOVIAL FLUID KNEE RIGHT   Final    Special Requests FLUID ON SWAB PT ON LEVAQUIN PO   Final    Gram Stain PENDING   Incomplete    Culture     Final    Value: NO ANAEROBES ISOLATED; CULTURE IN PROGRESS FOR 5 DAYS   Report Status PENDING   Incomplete   BODY FLUID CULTURE     Status: Normal (Preliminary result)   Collection Time   01/18/12  8:49 AM      Component Value Range Status Comment   Specimen Description SYNOVIAL FLUID KNEE RIGHT   Final    Special Requests FLUID ON SWAB PT ON LEVAQUIN PO   Final    Gram Stain     Final    Value: RARE WBC PRESENT,BOTH PMN AND MONONUCLEAR     NO ORGANISMS SEEN   Culture     Final    Value: FEW GROUP B STREP(S.AGALACTIAE)ISOLATED     Note: CRITICAL RESULT CALLED TO, READ BACK BY AND VERIFIED WITH: CINDY BEVERLY 01/21/12 0722 BY SMITHERSJ   Report Status PENDING   Incomplete     Studies/Results: No results found.  Medications: I have reviewed the patient's current medications.  1.  Hypokalemia probably from diuretics - Resolved.  2. Normocytic and normochromic anemia - Patient received 1 unit PRBC prior to surgery. Needs outpatient work up for anemia.  3. Chronic lower extremity edema - patient's edema is more on the right upper extremity than the left. Doppler of lower extremity to rule out DVT during this admission negative. Holding diuretic due to Soft SBP. 4. Postsurgical hypothyroidism - continue Synthroid.  5. Infected prosthetic right knee - S/P OPERATION: Removal of right total knee, Irrigation and debridement, Insertion of antibiotic spacer with Stimulan beads on 5-17. Continue with antibiotics per ID recommendation.  culture grew Strep Agalactiae.  6. Hypertension - I will hold BP medications, SBP soft, in setting narcotics and post surgery.  7-Hypotension: Improved.        LOS: 4 days   Namon Villarin M.D.  [facility] 01/21/2012, 3:27 PM

## 2012-01-21 NOTE — Progress Notes (Signed)
ANTIBIOTIC CONSULT NOTE - FOLLOW UP  Pharmacy Consult for Levaquin Indication: prosthetic joint infection  Allergies  Allergen Reactions  . Demerol Shortness Of Breath  . Lorazepam Anxiety  . Penicillins Swelling    "in the face"  . Tetracyclines & Related Rash  . Vancomycin Rash    Patient Measurements: Height: 5\' 3"  (160 cm) Weight: 168 lb 14 oz (76.6 kg) IBW/kg (Calculated) : 52.4   Vital Signs: Temp: 98.8 F (37.1 C) (05/20 0417) Temp src: Oral (05/20 0417) BP: 121/69 mmHg (05/20 0417) Pulse Rate: 71  (05/20 0417) Intake/Output from previous day: 05/19 0701 - 05/20 0700 In: 2235 [P.O.:1080; I.V.:550; Blood:605] Out: 3200 [Urine:3200] Labs:  [facility] 01/20/12 0457 01/19/12 0340  WBC 5.7 7.1  HGB 8.0* 9.1*  PLT 345 386  LABCREA -- --  CREATININE 0.99 0.86   Estimated Creatinine Clearance: 41.5 ml/min (by C-G formula based on Cr of 0.99). No results found for this basename: VANCOTROUGH:2,VANCOPEAK:2,VANCORANDOM:2,GENTTROUGH:2,GENTPEAK:2,GENTRANDOM:2,TOBRATROUGH:2,TOBRAPEAK:2,TOBRARND:2,AMIKACINPEAK:2,AMIKACINTROU:2,AMIKACIN:2, in the last 72 hours   Microbiology: Recent Results (from the past 720 hour(s))  CULTURE, BLOOD (ROUTINE X 2)     Status: Normal (Preliminary result)   Collection Time   01/17/12  5:19 PM      Component Value Range Status Comment   Specimen Description BLOOD ARM LEFT   Final    Special Requests BOTTLES DRAWN AEROBIC AND ANAEROBIC 10CC   Final    Culture  Setup Time [id number]   Final    Culture     Final    Value:        BLOOD CULTURE RECEIVED NO GROWTH TO DATE CULTURE WILL BE HELD FOR 5 DAYS BEFORE ISSUING A FINAL NEGATIVE REPORT   Report Status PENDING   Incomplete   CULTURE, BLOOD (ROUTINE X 2)     Status: Normal (Preliminary result)   Collection Time   01/17/12  5:37 PM      Component Value Range Status Comment   Specimen Description BLOOD ARM RIGHT   Final    Special Requests BOTTLES DRAWN AEROBIC AND ANAEROBIC 10CC   Final    Culture  Setup Time [id number]   Final    Culture     Final    Value:        BLOOD CULTURE RECEIVED NO GROWTH TO DATE CULTURE WILL BE HELD FOR 5 DAYS BEFORE ISSUING A FINAL NEGATIVE REPORT   Report Status PENDING   Incomplete   SURGICAL PCR SCREEN     Status: Normal   Collection Time   01/18/12  4:01 AM      Component Value Range Status Comment   MRSA, PCR NEGATIVE  NEGATIVE  Final    Staphylococcus aureus NEGATIVE  NEGATIVE  Final   URINE CULTURE     Status: Normal   Collection Time   01/18/12  5:11 AM      Component Value Range Status Comment   Specimen Description URINE, CLEAN CATCH   Final    Special Requests NONE   Final    Culture  Setup Time [id number]   Final    Colony Count NO GROWTH   Final    Culture NO GROWTH   Final    Report Status 01/19/2012 FINAL   Final   ANAEROBIC CULTURE     Status: Normal (Preliminary result)   Collection Time   01/18/12  8:49 AM      Component Value Range Status Comment   Specimen Description SYNOVIAL FLUID KNEE RIGHT   Final    Special  Requests FLUID ON SWAB PT ON LEVAQUIN PO   Final    Gram Stain PENDING   Incomplete    Culture     Final    Value: NO ANAEROBES ISOLATED; CULTURE IN PROGRESS FOR 5 DAYS   Report Status PENDING   Incomplete   BODY FLUID CULTURE     Status: Normal (Preliminary result)   Collection Time   01/18/12  8:49 AM      Component Value Range Status Comment   Specimen Description SYNOVIAL FLUID KNEE RIGHT   Final    Special Requests FLUID ON SWAB PT ON LEVAQUIN PO   Final    Gram Stain     Final    Value: RARE WBC PRESENT,BOTH PMN AND MONONUCLEAR     NO ORGANISMS SEEN   Culture     Final    Value: FEW GROUP B STREP(S.AGALACTIAE)ISOLATED     Note: CRITICAL RESULT CALLED TO, READ BACK BY AND VERIFIED WITH: CINDY BEVERLY 01/21/12 0722 BY SMITHERSJ   Report Status PENDING   Incomplete     Anti-infectives     Start     Dose/Rate Route Frequency Ordered Stop   01/19/12 1200   levofloxacin (LEVAQUIN) tablet 750 mg          750 mg Oral Every 48 hours 01/19/12 1022     01/18/12 1830   levofloxacin (LEVAQUIN) IVPB 750 mg  Status:  Discontinued        750 mg 100 mL/hr over 90 Minutes Intravenous Every 48 hours 01/18/12 1757 01/19/12 1022   01/18/12 0925   gentamicin (GARAMYCIN) injection  Status:  Discontinued          As needed 01/18/12 0931 01/18/12 1024   01/18/12 0745   levofloxacin (LEVAQUIN) IVPB 500 mg  Status:  Discontinued        500 mg 100 mL/hr over 60 Minutes Intravenous To Surgery 01/18/12 0736 01/18/12 1640          Assessment: Prosthetic joint infection, 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  Patient started on levaquin 5/18 now on day #2 of therapy. Tmax of 100.2 overnight, wbc trending down to 5.7, scr appears stable ~1. Given age and current scr trend it is doubtful that patient will qualify for daily levaquin dosing, will continue current regimen.   5/17 fluid culture from knee did show few Group B strep - awaiting sensitivities.  Goal of Therapy:  Appropriate antimicrobial therapy  Plan:  Levaquin 750mg  PO q48 Monitor renal function and micro data.  Sheppard Coil, Pharm.D., BCPS Clinical Pharmacist  Pager [phone] 01/21/2012, 8:46 AM

## 2012-01-21 NOTE — Progress Notes (Signed)
Orthopedic Tech Progress Note Patient Details:  [name] [birth year] [id number]  Other Ortho Devices Type of Ortho Device: Knee Immobilizer Ortho Device Location: right knee Ortho Device Interventions: Application Padded knee immobilizer to prevent staining from stool.  Leng Montesdeoca T 01/21/2012, 9:21 AM

## 2012-01-21 NOTE — Progress Notes (Signed)
Covering Clinical Social Worker followed up regarding disposition planning. Clinical Social Worker spoke with RNCM, Vance Peper who stated that she has spoken with family and plan if for home with home health services even though PT recommending SNF. RNCM assisting with home health arrangements. No further social work needs identified at this time. Clinical Social Worker signing off.   Jacklynn Lewis, MSW, LCSWA  Clinical Social Work [phone]

## 2012-01-21 NOTE — Progress Notes (Signed)
Physical Therapy Treatment Note   01/21/12 1017  PT Visit Information  Last PT Received On 01/21/12  Assistance Needed +1  PT Time Calculation  PT Start Time 1017  PT Stop Time 1046  PT Time Calculation (min) 29 min  Subjective Data  Subjective Pt received supine in bed. RN reports just giving her pain meds via IV. Patient con't to report desire to return home with husband.  Patient Stated Goal "I want to go home."  Precautions  Precautions Knee  Precaution Comments Knee immobilizer for walking, no knee ROM, WBAT  Required Braces or Orthoses Knee Immobilizer - Right  Knee Immobilizer - Right On at all times  Restrictions  RLE Weight Bearing WBAT  Cognition  Overall Cognitive Status Appears within functional limits for tasks assessed/performed  Arousal/Alertness Awake/alert  Orientation Level Appears intact for tasks assessed  Behavior During Session [facility] for tasks performed  Bed Mobility  Bed Mobility Supine to Sit;Sitting - Scoot to Edge of Bed  Supine to Sit 4: Min guard;HOB flat  Sitting - Scoot to [place] of Bed 4: Min guard  Details for Bed Mobility Assistance pt much improved from weekend. increased time required due to no use of bed rail to mimic home set up. Pt reports husband to help her get out of bed at home.  Transfers  Transfers Sit to Stand;Stand to Sit  Sit to Stand 4: Min guard;From bed  Stand to Sit 4: Min guard;With armrests;To chair/3-in-1  Details for Transfer Assistance v/c's for safe hand placement  Ambulation/Gait  Ambulation/Gait Assistance 4: Min guard  Ambulation Distance (Feet) 50 Feet  Assistive device Rolling walker  Ambulation/Gait Assistance Details initial v/c's for safe walker sequencing but then improved. pt c/o 10/10 R LE pain with weight-bearing, R KI on.  Gait Pattern Step-to pattern;Decreased step length - right;Decreased stance time - right;Antalgic  Gait velocity slow  Stairs Yes  Stairs Assistance 4: Min assist  Stairs Assistance  Details (indicate cue type and reason) v/c's for proper/safe sequencing (up with the L LE, down with R LE)  Stair Management Technique Two rails;Forwards  Number of Stairs 2  (to mimic home set up)  PT - End of Session  Equipment Utilized During Treatment Gait belt;Right knee immobilizer  Activity Tolerance Patient limited by pain;Patient limited by fatigue  Patient left in chair;with call bell/phone within reach  Nurse Communication Mobility status  PT - Assessment/Plan  Comments on Treatment Session Pt demonstrates significant improvement from the weekend. Patient con't to require 24/7 supervision for safey. Patient remains generalized deconditioned and to have increased R LE pain with any mvmt. Patient safe to return home with spouse to provide 24/7 supervision/assist and use of RW.  PT Plan Discharge plan needs to be updated;Frequency remains appropriate  PT Frequency Min 6X/week  Follow Up Recommendations Home health PT;Supervision/Assistance - 24 hour  Equipment Recommended None recommended by PT  Acute Rehab PT Goals  Time For Goal Achievement 02/02/12  Potential to Achieve Goals Good  PT Goal: Supine/Side to Sit - Progress Progressing toward goal  PT Goal: Sit to Supine/Side - Progress Progressing toward goal  PT Goal: Sit to Stand - Progress Progressing toward goal  PT Goal: Stand to Sit - Progress Progressing toward goal  PT Goal: Ambulate - Progress Progressing toward goal  Additional Goals  Additional Goal #1 Pt supervision with asc/desc 2 steps with bilat Handrail and supervision of spouse.  PT Goal: Additional Goal #1 - Progress Progressing toward goal  Pain: increased R knee pain with weight-bearing  Lewis Shock, PT, DPT Pager #: [phone] Office #: [phone]

## 2012-01-21 NOTE — Progress Notes (Addendum)
Occupational Therapy Evaluation Patient Details Name: [name] MRN: [id number] DOB: 1925/02/08 Today's Date: 01/21/2012 Time: 1400- 1419 19 min     OT Assessment / Plan / Recommendation Clinical Impression  . Pt's husband will be able to assist as needed after D/C. Pt OK for A/C when medically stable.    OT Assessment  Patient does not need any further OT services    Follow Up Recommendations  No OT follow up    Barriers to Discharge      Equipment Recommendations  None recommended by OT              Precautions / Restrictions Precautions Precautions: Knee Precaution Comments: Knee immobilizer for walking, no knee ROM, WBAT Required Braces or Orthoses: Knee Immobilizer - Right Knee Immobilizer - Right: On at all times Restrictions Weight Bearing Restrictions: Yes RLE Weight Bearing: Weight bearing as tolerated   Pertinent Vitals/Pain 8/10. nsg just given pt meds.    ADL  Eating/Feeding: Simulated;Independent Grooming: Simulated;Supervision/safety Where Assessed - Grooming: Supported standing Upper Body Bathing: Simulated;Set up Lower Body Bathing: Simulated;Moderate assistance Where Assessed - Lower Body Bathing: Supported standing Lower Body Dressing: Simulated;Moderate assistance Toilet Transfer: Other (comment) (Had just completed with CNA. Reports min guard.) Toilet Transfer Method: Stand pivot ADL Comments: Pt requiring A for ADL, but states that her husband will assist with all ADL. PT completin mobility at level where husband will be able to assist. No DME needs.    OT Diagnosis:   R knee hardware and placement of R knee spacer.  OT Problem List:   OT Treatment Interventions:   Discussed use of AE.  OT Goals Acute Rehab OT Goals OT Goal Formulation:  (eval only)  Visit Information  Last OT Received On: 01/21/12 Assistance Needed: +1    Subjective Data  Subjective: my husband does it all for me.   Prior Functioning  Home Living Lives With:  Spouse Available Help at Discharge: Family;Available 24 hours/day Type of Home: House Home Access: Stairs to enter [employer] of Steps: 2 Entrance Stairs-Rails: Can reach both Home Layout: One level Bathroom Shower/Tub: Walk-in shower;Door [employer] Toilet: Standard Bathroom Accessibility: Yes How Accessible: Accessible via walker Home Adaptive Equipment: Walker - rolling;Wheelchair - manual;Straight cane;Shower chair with back;Bedside commode/3-in-1;Hand-held shower hose;Grab bars around toilet;Grab bars in shower Prior Function Level of Independence: Independent with assistive device(s) Able to Take Stairs?: Yes Driving: No Vocation: Retired Musician: No difficulties Dominant Hand: Right    Cognition  Overall Cognitive Status: Appears within functional limits for tasks assessed/performed Arousal/Alertness: Awake/alert Orientation Level: Appears intact for tasks assessed Behavior During Session: [place] for tasks performed    Extremity/Trunk Assessment Left Upper Extremity Assessment LUE ROM/Strength/Tone: [facility] for tasks assessed Right Lower Extremity Assessment RLE ROM/Strength/Tone: WFL for tasks assessed   Mobility Transfers Transfers: Sit to Stand;Stand to Sit Sit to Stand: 4: Min guard   Exercise  UB theraband ex  Balance    End of Session OT - End of Session Equipment Utilized During Treatment: Right knee immobilizer Activity Tolerance: Patient tolerated treatment well Patient left: in bed;with call bell/phone within reach Nurse Communication: Other (comment) (D/C plans)   Kolby Schara,HILLARY 01/21/2012, 2:37 PM [facility], OTR/L  [phone] 01/21/2012

## 2012-01-21 NOTE — Progress Notes (Signed)
Subjective: 3 Days Post-Op Procedure(s) (LRB): EXCISIONAL TOTAL KNEE ARTHROPLASTY WITH ANTIBIOTIC SPACERS (Right) Patient reports pain as moderate and severe.  Depending on activity, painful with WB.    Objective: Vital signs in last 24 hours: Temp:  [98 F (36.7 C)-100.2 F (37.9 C)] 98.8 F (37.1 C) (05/20 0417) Pulse Rate:  [71-88] 71  (05/20 0417) Resp:  [16-20] 16  (05/20 0417) BP: (86-121)/(38-69) 121/69 mmHg (05/20 0417) SpO2:  [94 %-99 %] 98 % (05/20 0417)  Intake/Output from previous day: 05/19 0701 - 05/20 0700 In: 2235 [P.O.:1080; I.V.:550; Blood:605] Out: 3200 [Urine:3200] Intake/Output this shift: Total I/O In: 480 [P.O.:480] Out: -    Basename 01/21/12 0820 01/20/12 0457 01/19/12 0340  HGB 11.3* 8.0* 9.1*    Basename 01/21/12 0820 01/20/12 0457  WBC 6.4 5.7  RBC 3.91 2.78*  HCT 34.8* 25.1*  PLT 360 345    Basename 01/21/12 0820 01/20/12 0457  NA 131* 134*  K 3.8 3.6  CL 89* 91*  CO2 33* 34*  BUN 10 11  CREATININE 0.87 0.99  GLUCOSE 139* 131*  CALCIUM 9.4 8.8   No results found for this basename: LABPT:2,INR:2 in the last 72 hours  Neurovascular intact Sensation intact distally Intact pulses distally Dorsiflexion/Plantar flexion intact Incision: dressing C/D/I No cellulitis present Compartment soft RLE edema and swelling still present but much improved since prior to surgery.  Assessment/Plan: 3 Days Post-Op Procedure(s) (LRB): EXCISIONAL TOTAL KNEE ARTHROPLASTY WITH ANTIBIOTIC SPACERS (Right)  Continue up with PT. Transition to po pain meds, ER tablets in addition to IR.  Will try to discontinue IVP breakthrough meds as tolerated. Will discuss with orthopaedic team regarding d/c plans although PT report much improved from the weekend and feels pt would likely be able to d/c to home with HHPT and 24/7 supervision from husband.   Discussed with pt need for anemia workup as an outpatient. Continue to monitor Hypokalemia while  inpatient. Will confirm abx use, monitoring, and f/u with ID team. Plan on 2 week f/u with ortho office for wound check/staple removal. Possible d/c tomorrow.   Margart Sickles 01/21/2012, 1:13 PM

## 2012-01-21 NOTE — Discharge Instructions (Signed)
Diet: As you were doing prior to hospitalization  Activity:  Increase activity slowly as tolerated                  No lifting or driving  Shower:  May shower without a dressing starting 01/23/2012.  No Soaking in tubs/pools.  Dressing:  Then cover incision with clean dry dressing.  Weight Bearing:  50% weight bearing as taught in physical therapy.  Use a walker or                    Crutches as instructed. In Knee Immobilizer.  To prevent constipation: you may use a stool softener such as -               Colace ( over the counter) 100 mg by mouth twice a day                Drink plenty of fluids ( prune juice may be helpful) and high fiber foods                Miralax ( over the counter) for constipation as needed.    Precautions:  If you experience chest pain or shortness of breath - call 911 immediately               For transfer to the hospital emergency department!!               If you develop a fever greater that 101 F, purulent drainage from wound,                             increased redness or drainage from wound, or calf pain -- Call the office at                                                 [phone].  Follow- Up Appointment:  Please call for an appointment to be seen on 01/31/2012 by Dr. Madelon Lips.               [facility] - [phone]               [facility] - [phone]               [place] - [phone]  Home Health to be provided by [facility] [phone]

## 2012-01-22 DIAGNOSIS — R112 Nausea with vomiting, unspecified: Secondary | ICD-10-CM

## 2012-01-22 DIAGNOSIS — T8450XA Infection and inflammatory reaction due to unspecified internal joint prosthesis, initial encounter: Principal | ICD-10-CM

## 2012-01-22 DIAGNOSIS — E876 Hypokalemia: Secondary | ICD-10-CM

## 2012-01-22 DIAGNOSIS — R609 Edema, unspecified: Secondary | ICD-10-CM | POA: Diagnosis not present

## 2012-01-22 DIAGNOSIS — M7989 Other specified soft tissue disorders: Secondary | ICD-10-CM

## 2012-01-22 DIAGNOSIS — Y831 Surgical operation with implant of artificial internal device as the cause of abnormal reaction of the patient, or of later complication, without mention of misadventure at the time of the procedure: Secondary | ICD-10-CM

## 2012-01-22 LAB — BODY FLUID CULTURE

## 2012-01-22 LAB — BASIC METABOLIC PANEL
BUN: 11 mg/dL (ref 6–23)
Chloride: 88 mEq/L — ABNORMAL LOW (ref 96–112)
GFR calc Af Amer: 65 mL/min — ABNORMAL LOW (ref >90)
Potassium: 3.3 mEq/L — ABNORMAL LOW (ref 3.5–5.1)

## 2012-01-22 MED ORDER — OXYCODONE HCL 15 MG PO TB12: 15.0000 mg | ORAL_TABLET | Freq: Two times a day (BID) | ORAL | Status: DC

## 2012-01-22 MED ORDER — LEVOFLOXACIN 750 MG PO TABS: 750.0000 mg | ORAL_TABLET | ORAL | Status: AC

## 2012-01-22 MED ORDER — OXYCODONE HCL 15 MG PO TABS: 15.0000 mg | ORAL_TABLET | ORAL | Status: AC | PRN

## 2012-01-22 MED ORDER — ENOXAPARIN SODIUM 30 MG/0.3ML ~~LOC~~ SOLN: 30.0000 mg | Freq: Two times a day (BID) | SUBCUTANEOUS | Status: DC

## 2012-01-22 NOTE — Progress Notes (Signed)
I notice patient was discharge today. Her BP has been in the high 95. I called patient home and advised her not to take Torsemide and  Maxzide. I advised her to take potassium supplement only today. She will follow up with her PCP for BP and B-met this week. She voice to understand.  Shaunessy Dobratz, Md.

## 2012-01-22 NOTE — Progress Notes (Signed)
ANTIBIOTIC CONSULT NOTE - FOLLOW UP  Pharmacy Consult for Levaquin Indication: prosthetic joint infection  Allergies  Allergen Reactions  . Demerol Shortness Of Breath  . Lorazepam Anxiety  . Penicillins Swelling    "in the face"  . Tetracyclines & Related Rash  . Vancomycin Rash    Patient Measurements: Height: 5\' 3"  (160 cm) Weight: 168 lb 14 oz (76.6 kg) IBW/kg (Calculated) : 52.4   Vital Signs: Temp: 99.5 F (37.5 C) (05/21 0547) Temp src: Oral (05/20 2140) BP: 119/78 mmHg (05/21 0547) Pulse Rate: 76  (05/21 0547) Intake/Output from previous day: 05/20 0701 - 05/21 0700 In: 960 [P.O.:960] Out: -  Labs:  Basename 01/21/12 0820 01/20/12 0457  WBC 6.4 5.7  HGB 11.3* 8.0*  PLT 360 345  LABCREA -- --  CREATININE 0.87 0.99   Estimated Creatinine Clearance: 47.2 ml/min (by C-G formula based on Cr of 0.87). No results found for this basename: VANCOTROUGH:2,VANCOPEAK:2,VANCORANDOM:2,GENTTROUGH:2,GENTPEAK:2,GENTRANDOM:2,TOBRATROUGH:2,TOBRAPEAK:2,TOBRARND:2,AMIKACINPEAK:2,AMIKACINTROU:2,AMIKACIN:2, in the last 72 hours   Microbiology: Recent Results (from the past 720 hour(s))  CULTURE, BLOOD (ROUTINE X 2)     Status: Normal (Preliminary result)   Collection Time   01/17/12  5:19 PM      Component Value Range Status Comment   Specimen Description BLOOD ARM LEFT   Final    Special Requests BOTTLES DRAWN AEROBIC AND ANAEROBIC 10CC   Final    Culture  Setup Time [id number]   Final    Culture     Final    Value:        BLOOD CULTURE RECEIVED NO GROWTH TO DATE CULTURE WILL BE HELD FOR 5 DAYS BEFORE ISSUING A FINAL NEGATIVE REPORT   Report Status PENDING   Incomplete   CULTURE, BLOOD (ROUTINE X 2)     Status: Normal (Preliminary result)   Collection Time   01/17/12  5:37 PM      Component Value Range Status Comment   Specimen Description BLOOD ARM RIGHT   Final    Special Requests BOTTLES DRAWN AEROBIC AND ANAEROBIC 10CC   Final    Culture  Setup Time [id number]    Final    Culture     Final    Value:        BLOOD CULTURE RECEIVED NO GROWTH TO DATE CULTURE WILL BE HELD FOR 5 DAYS BEFORE ISSUING A FINAL NEGATIVE REPORT   Report Status PENDING   Incomplete   SURGICAL PCR SCREEN     Status: Normal   Collection Time   01/18/12  4:01 AM      Component Value Range Status Comment   MRSA, PCR NEGATIVE  NEGATIVE  Final    Staphylococcus aureus NEGATIVE  NEGATIVE  Final   URINE CULTURE     Status: Normal   Collection Time   01/18/12  5:11 AM      Component Value Range Status Comment   Specimen Description URINE, CLEAN CATCH   Final    Special Requests NONE   Final    Culture  Setup Time [id number]   Final    Colony Count NO GROWTH   Final    Culture NO GROWTH   Final    Report Status 01/19/2012 FINAL   Final   ANAEROBIC CULTURE     Status: Normal (Preliminary result)   Collection Time   01/18/12  8:49 AM      Component Value Range Status Comment   Specimen Description SYNOVIAL FLUID KNEE RIGHT   Final    Special Requests  FLUID ON SWAB PT ON LEVAQUIN PO   Final    Gram Stain PENDING   Incomplete    Culture     Final    Value: NO ANAEROBES ISOLATED; CULTURE IN PROGRESS FOR 5 DAYS   Report Status PENDING   Incomplete   BODY FLUID CULTURE     Status: Normal (Preliminary result)   Collection Time   01/18/12  8:49 AM      Component Value Range Status Comment   Specimen Description SYNOVIAL FLUID KNEE RIGHT   Final    Special Requests FLUID ON SWAB PT ON LEVAQUIN PO   Final    Gram Stain     Final    Value: RARE WBC PRESENT,BOTH PMN AND MONONUCLEAR     NO ORGANISMS SEEN   Culture     Final    Value: FEW GROUP B STREP(S.AGALACTIAE)ISOLATED     Note: CRITICAL RESULT CALLED TO, READ BACK BY AND VERIFIED WITH: CINDY BEVERLY 01/21/12 0722 BY SMITHERSJ   Report Status PENDING   Incomplete     Anti-infectives     Start     Dose/Rate Route Frequency Ordered Stop   01/19/12 1200   levofloxacin (LEVAQUIN) tablet 750 mg        750 mg Oral Every 48 hours  01/19/12 1022     01/18/12 1830   levofloxacin (LEVAQUIN) IVPB 750 mg  Status:  Discontinued        750 mg 100 mL/hr over 90 Minutes Intravenous Every 48 hours 01/18/12 1757 01/19/12 1022   01/18/12 0925   gentamicin (GARAMYCIN) injection  Status:  Discontinued          As needed 01/18/12 0931 01/18/12 1024   01/18/12 0745   levofloxacin (LEVAQUIN) IVPB 500 mg  Status:  Discontinued        500 mg 100 mL/hr over 60 Minutes Intravenous To Surgery 01/18/12 0736 01/18/12 1640          Assessment: Prosthetic joint infection, 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  Patient started on levaquin 5/18 now on day #3 of therapy. Tmax of 100.1 overnight, wbc stable to 6.4, scr appears stable. Given age and current scr do not forsee any need for dose adjustments during hospital stay. Pharmacy will sign off and continue to follow peripherally. Please reconsult or call if you have questions  5/17 fluid culture from knee did show few Group B strep - awaiting sensitivities.  Goal of Therapy:  Appropriate antimicrobial therapy  Plan:  Levaquin 750mg  PO q48 Monitor renal function and micro data.  Sheppard Coil, Pharm.D., BCPS Clinical Pharmacist  01/22/2012, 8:36 AM

## 2012-01-22 NOTE — Progress Notes (Signed)
Pt D/C to home. Scripts and D/C instructions given. 

## 2012-01-22 NOTE — Progress Notes (Signed)
PT Progress Note:     01/22/12 1100  PT Visit Information  Last PT Received On 01/22/12  Assistance Needed +1  PT Time Calculation  PT Start Time 0906  PT Stop Time 0936  PT Time Calculation (min) 30 min  Precautions  Precautions Knee  Precaution Comments Knee immobilizer for walking, no knee ROM, WBAT  Required Braces or Orthoses Knee Immobilizer - Right  Knee Immobilizer - Right On at all times  Restrictions  RLE Weight Bearing WBAT  Cognition  Overall Cognitive Status Appears within functional limits for tasks assessed/performed  Arousal/Alertness Awake/alert  Orientation Level Appears intact for tasks assessed  Behavior During Session [facility] for tasks performed  Bed Mobility  Bed Mobility Supine to Sit;Sitting - Scoot to Edge of Bed  Supine to Sit 6: Modified independent (Device/Increase time);HOB flat  Sitting - Scoot to Edge of Bed 6: Modified independent (Device/Increase time)  Details for Bed Mobility Assistance Continues to improve.  Smoother transitioning.    Transfers  Transfers Sit to Stand;Stand to Sit  Sit to Stand 4: Min guard;With upper extremity assist;From bed;From chair/3-in-1;With armrests  Stand to Sit 4: Min guard;With upper extremity assist;With armrests;To chair/3-in-1  Details for Transfer Assistance reinforcement cues for hand placement & safe body positioning before sitting.    Ambulation/Gait  Ambulation/Gait Assistance 4: Min guard  Ambulation Distance (Feet) 60 Feet  Assistive device Rolling walker  Ambulation/Gait Assistance Details cues to increase step length LLE as distance increased.  Pt reports "Im so tired & weak".  Encouragement throughout distance.    Gait Pattern Step-to pattern;Decreased stance time - right;Decreased step length - left  Gait velocity slow  Stairs Yes  Stairs Assistance 3: Mod assist;4: Min assist  Stairs Assistance Details (indicate cue type and reason) Performed 2x's.  1st trial pt requried Mod (A) to step up with LLE  but then progressed to Min (A) on 2nd trial.  Cues for sequencing & technique.  Pt with lateral weight shifting to Rt to allow Lt advancement up.   Stair Management Technique Two rails;Forwards  Engineering geologist No  Exercises  Exercises Total Joint  Total Joint Exercises  Ankle Circles/Pumps AROM;Both;15 reps;Supine  Quad Sets AROM;Both;15 reps;Supine  Straight Leg Raises AROM;Right;10 reps;Supine  PT - End of Session  Equipment Utilized During Treatment Gait belt;Right knee immobilizer  Activity Tolerance Patient limited by pain;Patient limited by fatigue  Patient left in chair;with call bell/phone within reach  PT - Assessment/Plan  Comments on Treatment Session Pt moving fairly well but requires encouragement throughout.  Pt reports pain & weakness/fatigue limit mobility.    PT Plan Discharge plan remains appropriate;Frequency remains appropriate  PT Frequency Min 6X/week  Follow Up Recommendations Home health PT;Supervision/Assistance - 24 hour  Equipment Recommended None recommended by PT  Acute Rehab PT Goals  Time For Goal Achievement 02/02/12  Potential to Achieve Goals Good  PT Goal: Supine/Side to Sit - Progress Met  PT Goal: Sit to Stand - Progress Met  PT Goal: Stand to Sit - Progress Met  PT Goal: Ambulate - Progress Met  Additional Goals  PT Goal: Additional Goal #1 - Progress Progressing toward goal      Verdell Face, PTA [phone] 01/22/2012

## 2012-01-22 NOTE — Progress Notes (Signed)
Subjective: Patient feeling well. Pain on and off,pain meds helping.  Objective: Filed Vitals:   01/21/12 0417 01/21/12 1339 01/21/12 2140 01/22/12 0547  BP: 121/69 96/49 138/90 119/78  Pulse: 71 78 86 76  Temp: 98.8 F (37.1 C) 100.1 F (37.8 C) 98.7 F (37.1 C) 99.5 F (37.5 C)  TempSrc: Oral Oral Oral   Resp: 16 18 18 18   Height:      Weight:      SpO2: 98% 98% 96% 94%   Weight change:    General: Alert, awake, oriented x3, in no acute distress.  HEENT: No bruits, no goiter.  Heart: Regular rate and rhythm, without murmurs, rubs, gallops.  Lungs: CTA, bilateral air movement.  Abdomen: Soft, nontender, nondistended, positive bowel sounds.  Neuro: Grossly intact, nonfocal. Extremities; Right LE with dressing,inmobiliazer.   Lab Results:  [facility] 01/21/12 0820 01/20/12 0457  NA 131* 134*  K 3.8 3.6  CL 89* 91*  CO2 33* 34*  GLUCOSE 139* 131*  BUN 10 11  CREATININE 0.87 0.99  CALCIUM 9.4 8.8  MG -- --  PHOS -- --    Basename 01/21/12 0820 01/20/12 0457  WBC 6.4 5.7  NEUTROABS -- --  HGB 11.3* 8.0*  HCT 34.8* 25.1*  MCV 89.0 90.3  PLT 360 345   Micro Results: Recent Results (from the past 240 hour(s))  CULTURE, BLOOD (ROUTINE X 2)     Status: Normal (Preliminary result)   Collection Time   01/17/12  5:19 PM      Component Value Range Status Comment   Specimen Description BLOOD ARM LEFT   Final    Special Requests BOTTLES DRAWN AEROBIC AND ANAEROBIC 10CC   Final    Culture  Setup Time [id number]   Final    Culture     Final    Value:        BLOOD CULTURE RECEIVED NO GROWTH TO DATE CULTURE WILL BE HELD FOR 5 DAYS BEFORE ISSUING A FINAL NEGATIVE REPORT   Report Status PENDING   Incomplete   CULTURE, BLOOD (ROUTINE X 2)     Status: Normal (Preliminary result)   Collection Time   01/17/12  5:37 PM      Component Value Range Status Comment   Specimen Description BLOOD ARM RIGHT   Final    Special Requests BOTTLES DRAWN AEROBIC AND ANAEROBIC 10CC   Final      Culture  Setup Time [id number]   Final    Culture     Final    Value:        BLOOD CULTURE RECEIVED NO GROWTH TO DATE CULTURE WILL BE HELD FOR 5 DAYS BEFORE ISSUING A FINAL NEGATIVE REPORT   Report Status PENDING   Incomplete   SURGICAL PCR SCREEN     Status: Normal   Collection Time   01/18/12  4:01 AM      Component Value Range Status Comment   MRSA, PCR NEGATIVE  NEGATIVE  Final    Staphylococcus aureus NEGATIVE  NEGATIVE  Final   URINE CULTURE     Status: Normal   Collection Time   01/18/12  5:11 AM      Component Value Range Status Comment   Specimen Description URINE, CLEAN CATCH   Final    Special Requests NONE   Final    Culture  Setup Time [id number]   Final    Colony Count NO GROWTH   Final    Culture NO GROWTH   Final  Report Status 01/19/2012 FINAL   Final   ANAEROBIC CULTURE     Status: Normal (Preliminary result)   Collection Time   01/18/12  8:49 AM      Component Value Range Status Comment   Specimen Description SYNOVIAL FLUID KNEE RIGHT   Final    Special Requests FLUID ON SWAB PT ON LEVAQUIN PO   Final    Gram Stain PENDING   Incomplete    Culture     Final    Value: NO ANAEROBES ISOLATED; CULTURE IN PROGRESS FOR 5 DAYS   Report Status PENDING   Incomplete   BODY FLUID CULTURE     Status: Normal   Collection Time   01/18/12  8:49 AM      Component Value Range Status Comment   Specimen Description SYNOVIAL FLUID KNEE RIGHT   Final    Special Requests FLUID ON SWAB PT ON LEVAQUIN PO   Final    Gram Stain     Final    Value: RARE WBC PRESENT,BOTH PMN AND MONONUCLEAR     NO ORGANISMS SEEN   Culture     Final    Value: FEW GROUP B STREP(S.AGALACTIAE)ISOLATED     Note: CRITICAL RESULT CALLED TO, READ BACK BY AND VERIFIED WITH: CINDY BEVERLY 01/21/12 0722 BY SMITHERSJ   Report Status 01/22/2012 FINAL   Final    Organism ID, Bacteria GROUP B STREP(S.AGALACTIAE)ISOLATED   Final     Studies/Results: No results found.  Medications: I have reviewed the  patient's current medications.  1. Hypokalemia probably from diuretics - Resolved.  2. Normocytic and normochromic anemia - Patient received 1 unit PRBC prior to surgery. Needs outpatient work up for anemia.  3. Chronic lower extremity edema - patient's edema is more on the right upper extremity than the left. Doppler of lower extremity to rule out DVT during this admission negative. Holding diuretic due to Soft SBP.  4. Postsurgical hypothyroidism - continue Synthroid.  5. Infected prosthetic right knee - S/P OPERATION: Removal of right total knee, Irrigation and debridement, Insertion of antibiotic spacer with Stimulan beads on 5-17. Continue with antibiotics per ID recommendation. culture grew Strep Agalactiae. Still with mild fever.  6. Hypertension - I will hold BP medications, SBP soft, in setting narcotics and post surgery.  7-Hypotension: Improved.        LOS: 5 days   Idania Desouza M.D.  [facility] 01/22/2012, 11:28 AM

## 2012-01-22 NOTE — Progress Notes (Signed)
INFECTIOUS DISEASE PROGRESS NOTE  ID: [name] is a 78 y.o. female with   Principal Problem:  *Infection of prosthetic knee joint Active Problems:  Prosthetic joint infection  Bilateral lower extremity edema  Hypokalemia  Anemia due to blood loss, acute  Anemia of unknown etiology  Subjective: Without complaints  Abtx:  Anti-infectives     Start     Dose/Rate Route Frequency Ordered Stop   01/19/12 1200   levofloxacin (LEVAQUIN) tablet 750 mg        750 mg Oral Every 48 hours 01/19/12 1022     01/18/12 1830   levofloxacin (LEVAQUIN) IVPB 750 mg  Status:  Discontinued        750 mg 100 mL/hr over 90 Minutes Intravenous Every 48 hours 01/18/12 1757 01/19/12 1022   01/18/12 0925   gentamicin (GARAMYCIN) injection  Status:  Discontinued          As needed 01/18/12 0931 01/18/12 1024   01/18/12 0745   levofloxacin (LEVAQUIN) IVPB 500 mg  Status:  Discontinued        500 mg 100 mL/hr over 60 Minutes Intravenous To Surgery 01/18/12 0736 01/18/12 1640          Medications:  Scheduled:   . docusate sodium  100 mg Oral BID  . dorzolamide-timolol  1 drop Both Eyes BID  . enoxaparin  30 mg Subcutaneous Q12H  . feeding supplement  237 mL Oral BID BM  . gabapentin  300 mg Oral QHS  . latanoprost  1 drop Both Eyes QHS  . levofloxacin  750 mg Oral Q48H  . levothyroxine  100 mcg Oral QAC breakfast  . oxyCODONE  15 mg Oral Q12H  . DISCONTD: polyethylene glycol  17 g Oral BID    Objective: Vital signs in last 24 hours: Temp:  [98.7 F (37.1 C)-100.1 F (37.8 C)] 99.5 F (37.5 C) (05/21 0547) Pulse Rate:  [76-86] 76  (05/21 0547) Resp:  [18] 18  (05/21 0547) BP: (96-138)/(49-90) 119/78 mmHg (05/21 0547) SpO2:  [94 %-98 %] 94 % (05/21 0547)   General appearance: alert, cooperative and no distress Resp: clear to auscultation bilaterally Cardio: regular rate and rhythm GI: normal findings: bowel sounds normal and soft, non-tender Extremities: normal light touch R  toes. her R LE is wrapped.   Lab Results  Basename 01/22/12 1102 01/21/12 0820 01/20/12 0457  WBC -- 6.4 5.7  HGB -- 11.3* 8.0*  HCT -- 34.8* 25.1*  NA 132* 131* --  K 3.3* 3.8 --  CL 88* 89* --  CO2 35* 33* --  BUN 11 10 --  CREATININE 0.91 0.87 --  GLU -- -- --   Liver Panel No results found for this basename: PROT:2,ALBUMIN:2,AST:2,ALT:2,ALKPHOS:2,BILITOT:2,BILIDIR:2,IBILI:2 in the last 72 hours Sedimentation Rate No results found for this basename: ESRSEDRATE in the last 72 hours C-Reactive Protein No results found for this basename: CRP:2 in the last 72 hours  Microbiology: Recent Results (from the past 240 hour(s))  CULTURE, BLOOD (ROUTINE X 2)     Status: Normal (Preliminary result)   Collection Time   01/17/12  5:19 PM      Component Value Range Status Comment   Specimen Description BLOOD ARM LEFT   Final    Special Requests BOTTLES DRAWN AEROBIC AND ANAEROBIC 10CC   Final    Culture  Setup Time [id number]   Final    Culture     Final    Value:  BLOOD CULTURE RECEIVED NO GROWTH TO DATE CULTURE WILL BE HELD FOR 5 DAYS BEFORE ISSUING A FINAL NEGATIVE REPORT   Report Status PENDING   Incomplete   CULTURE, BLOOD (ROUTINE X 2)     Status: Normal (Preliminary result)   Collection Time   01/17/12  5:37 PM      Component Value Range Status Comment   Specimen Description BLOOD ARM RIGHT   Final    Special Requests BOTTLES DRAWN AEROBIC AND ANAEROBIC 10CC   Final    Culture  Setup Time [id number]   Final    Culture     Final    Value:        BLOOD CULTURE RECEIVED NO GROWTH TO DATE CULTURE WILL BE HELD FOR 5 DAYS BEFORE ISSUING A FINAL NEGATIVE REPORT   Report Status PENDING   Incomplete   SURGICAL PCR SCREEN     Status: Normal   Collection Time   01/18/12  4:01 AM      Component Value Range Status Comment   MRSA, PCR NEGATIVE  NEGATIVE  Final    Staphylococcus aureus NEGATIVE  NEGATIVE  Final   URINE CULTURE     Status: Normal   Collection Time   01/18/12   5:11 AM      Component Value Range Status Comment   Specimen Description URINE, CLEAN CATCH   Final    Special Requests NONE   Final    Culture  Setup Time [id number]   Final    Colony Count NO GROWTH   Final    Culture NO GROWTH   Final    Report Status 01/19/2012 FINAL   Final   ANAEROBIC CULTURE     Status: Normal (Preliminary result)   Collection Time   01/18/12  8:49 AM      Component Value Range Status Comment   Specimen Description SYNOVIAL FLUID KNEE RIGHT   Final    Special Requests FLUID ON SWAB PT ON LEVAQUIN PO   Final    Gram Stain PENDING   Incomplete    Culture     Final    Value: NO ANAEROBES ISOLATED; CULTURE IN PROGRESS FOR 5 DAYS   Report Status PENDING   Incomplete   BODY FLUID CULTURE     Status: Normal   Collection Time   01/18/12  8:49 AM      Component Value Range Status Comment   Specimen Description SYNOVIAL FLUID KNEE RIGHT   Final    Special Requests FLUID ON SWAB PT ON LEVAQUIN PO   Final    Gram Stain     Final    Value: RARE WBC PRESENT,BOTH PMN AND MONONUCLEAR     NO ORGANISMS SEEN   Culture     Final    Value: FEW GROUP B STREP(S.AGALACTIAE)ISOLATED     Note: CRITICAL RESULT CALLED TO, READ BACK BY AND VERIFIED WITH: CINDY BEVERLY 01/21/12 0722 BY [email or web address]   Report Status 01/22/2012 FINAL   Final    Organism ID, Bacteria GROUP B STREP(S.AGALACTIAE)ISOLATED   Final     Studies/Results: No results found.   Assessment/Plan: Group B strep Prosthetic Joint infection R TKR resection of infected joint (01-18-12) Adverse Drug Reactions (vanco rash, dapto periorbital swelling, PEN ?) Levaquin since 01-18-12  Would plan to continue her levaquin for 6 weeks.  Cautioned pt and husband not to take ant-acids, diary with levaquin.  F/u in ID clinic with Dr Drue Second or Dr Daiva Eves in 1 month  Johny Sax Infectious Diseases [phone] 01/22/2012, 12:10 PM   LOS: 5 days

## 2012-01-22 NOTE — Discharge Summary (Signed)
[phone]  PATIENT ID: [name]        MRN:  [id number]          DOB/AGE: February 06, 1925 / 78 y.o.    DISCHARGE SUMMARY  ADMISSION DATE:    01/17/2012 DISCHARGE DATE:   01/22/2012   ADMISSION DIAGNOSIS: Infected right total knee replacement infected Total knee arthroplasty    DISCHARGE DIAGNOSIS:  infected Total knee arthroplasty    ADDITIONAL DIAGNOSIS: Principal Problem:  *Infection of prosthetic knee joint Active Problems:  Prosthetic joint infection  Bilateral lower extremity edema  Hypokalemia  Anemia due to blood loss, acute  Anemia of unknown etiology  Past Medical History  Diagnosis Date  . Angina   . Headache   . Hypoglycemia   . Fracture 2008    back  . Macular degeneration of both eyes   . Glaucoma   . Pneumonia   . Hypothyroidism   . Blood transfusion   . Anemia   . GERD (gastroesophageal reflux disease)   . H/O hiatal hernia   . Arthritis   . Chronic back pain   . Thyroid cancer 1986  . Shortness of breath 11/08/11    "here lately, all the time"  . Exertional dyspnea 01/17/12  . Osteomyelitis 11/2011    right knee    PROCEDURE: Procedure(s): EXCISIONAL TOTAL KNEE ARTHROPLASTY WITH ANTIBIOTIC SPACERS on 01/17/2012 - 01/18/2012  CONSULTS: Treatment Team:  Joanna Hews, MD  ID Hospitalist PT/OT  HISTORY:  See H&P in chart  HOSPITAL COURSE:  [name] is a 78 y.o. admitted on 01/17/2012 and found to have a diagnosis of infected Total knee arthroplasty.  After appropriate laboratory studies were obtained  they were taken to the operating room on 01/17/2012 - 01/18/2012 and underwent Procedure(s): EXCISIONAL TOTAL KNEE ARTHROPLASTY WITH ANTIBIOTIC SPACERS.   They were given perioperative antibiotics:  Anti-infectives     Start     Dose/Rate Route Frequency Ordered Stop   01/19/12 1200   levofloxacin (LEVAQUIN) tablet 750 mg        750 mg Oral Every 48 hours 01/19/12 1022     01/18/12 1830   levofloxacin  (LEVAQUIN) IVPB 750 mg  Status:  Discontinued        750 mg 100 mL/hr over 90 Minutes Intravenous Every 48 hours 01/18/12 1757 01/19/12 1022   01/18/12 0925   gentamicin (GARAMYCIN) injection  Status:  Discontinued          As needed 01/18/12 0931 01/18/12 1024   01/18/12 0745   levofloxacin (LEVAQUIN) IVPB 500 mg  Status:  Discontinued        500 mg 100 mL/hr over 60 Minutes Intravenous To Surgery 01/18/12 0736 01/18/12 1640        .  Tolerated the procedure well.  Placed with a foley intraoperatively.    Abx were held initially following surgery, then dosing for Levaquin by pharmacist started.  POD #1, allowed out of bed to a chair.  PT for ambulation and exercise program.  Foley D/C'd in morning.  IV saline locked.  O2 discontionued.  Started on [place] Lovenox for DVT proph.   POD #2, continued PT and ambulation.   Hemovac pulled.  Pt had difficulty with ambulation per PT who at that time recommended SNF.  POD#3, pt did much better with PT feel  she could go home with [facility].  Continued pain management.  POD#4, again pt did well with PT able to do steps, WB in [place].  D/c IVP pain meds, tolerating po diet and meds, d/c home with HHPT.  . The remainder of the hospital course was dedicated to ambulation and strengthening.   The patient was discharged on 4 Days Post-Op in  Stable condition.  Blood products given: 1 unit PRBC given intraop, 2 units given POD#1 for anemia  DIAGNOSTIC STUDIES: Recent vital signs: Patient Vitals for the past 24 hrs:  BP Temp Temp src Pulse Resp SpO2  01/22/12 0547 119/78 mmHg 99.5 F (37.5 C) - 76  18  94 %  01/21/12 2140 138/90 mmHg 98.7 F (37.1 C) Oral 86  18  96 %  01/21/12 1339 96/49 mmHg 100.1 F (37.8 C) Oral 78  18  98 %       Recent laboratory studies:  Basename 01/21/12 0820 02-03-2012 0457 01/19/12 0340 01/18/12 0511 01/17/12 1719  WBC 6.4 5.7 7.1 5.7 7.0  HGB 11.3* 8.0* 9.1* 8.2* 8.5*  HCT 34.8* 25.1* 28.2* 25.8* 26.5*  PLT 360 345 386 366  368    Basename 01/22/12 1102 01/21/12 0820 2012/02/03 0457 01/19/12 0340 01/18/12 0511 01/17/12 1719  NA 132* 131* 134* 135 134* 133*  K 3.3* 3.8 3.6 3.0* 3.1* 2.5*  CL 88* 89* 91* 91* 90* 85*  CO2 35* 33* 34* 33* 36* 38*  BUN 11 10 11 9 13 17   CREATININE 0.91 0.87 0.99 0.86 0.90 1.00  GLUCOSE 124* 139* 131* 133* 110* 128*  CALCIUM 9.8 9.4 8.8 8.9 8.2* 8.5   Lab Results  Component Value Date   INR 1.05 01/17/2012   INR 1.24 11/08/2011   INR 0.91 03/23/2011     Recent Radiographic Studies :  Dg Chest Portable 1 View  01/17/2012  *RADIOLOGY REPORT*  Clinical Data: Preoperative right knee assessment.  Prior thyroidectomy.  History of angina.  Chronic back pain.  PORTABLE CHEST - 1 VIEW  Comparison: 12/19/2011  Findings: The patient is rotated to the right on today's exam, resulting in reduced diagnostic sensitivity and specificity. Considerable thoracic spondylosis is present.  Scarring noted in the lingula and right middle lobe.  Heart size is within normal limits.  IMPRESSION:  1.  Thoracic spondylosis. 2.  Mild scarring in the lingula and right middle lobe.  Original Report Authenticated By: Dellia Cloud, M.D.    DISCHARGE INSTRUCTIONS:   DISCHARGE MEDICATIONS:   Medication List  As of 01/22/2012 12:20 PM   ASK your doctor about these medications         diazepam 5 MG tablet   Commonly known as: VALIUM   Take 1 tablet by mouth At bedtime as needed. For sleep      dorzolamide-timolol 22.3-6.8 MG/ML ophthalmic solution   Commonly known as: COSOPT   Place 1 drop into both eyes 2 (two) times daily.      gabapentin 300 MG capsule   Commonly known as: NEURONTIN   Take 300 mg by mouth at bedtime.      latanoprost 0.005 % ophthalmic solution   Commonly known as: XALATAN   Place 1 drop into both eyes at bedtime.      levothyroxine 100 MCG tablet   Commonly known as: SYNTHROID, LEVOTHROID   Take 100 mcg by mouth daily.      oxyCODONE 15 MG immediate release tablet    Commonly known as: ROXICODONE   Take 1  tablet by mouth Every 6 hours as needed. For pain      potassium chloride SA 20 MEQ tablet   Commonly known as: K-DUR,KLOR-CON   Take 2 tablets (40 mEq total) by mouth 2 (two) times daily.      propranolol 20 MG tablet   Commonly known as: INDERAL   Take 20 mg by mouth daily. Takes for migraines.      torsemide 20 MG tablet   Commonly known as: DEMADEX   Take 2 tablets (40 mg total) by mouth daily.      triamterene-hydrochlorothiazide 37.5-25 MG per tablet   Commonly known as: MAXZIDE-25   Take 0.5 tablets by mouth daily.            FOLLOW UP VISIT:   Follow-up Information    Call CAFFREY JR,W D, MD. (to make appointment on 01/31/2012)    Contact information:   [facility], [facility] [address] [place] [address] [phone]          DISPOSITION:  Home    CONDITION:  Stable  Discussed home abx per ID, discussed DVT proph with lovenox, discussed pain management, discussed need for f/u with PCP to work up anemia of unknown etiology which she had upon admission.     Margart Sickles 01/22/2012, 12:20 PM

## 2012-01-22 NOTE — Progress Notes (Signed)
Subjective: 4 Days Post-Op Procedure(s) (LRB): EXCISIONAL TOTAL KNEE ARTHROPLASTY WITH ANTIBIOTIC SPACERS (Right) Patient reports pain as moderate.    Objective: Vital signs in last 24 hours: Temp:  [98.7 F (37.1 C)-100.1 F (37.8 C)] 99.5 F (37.5 C) (05/21 0547) Pulse Rate:  [76-86] 76  (05/21 0547) Resp:  [18] 18  (05/21 0547) BP: (96-138)/(49-90) 119/78 mmHg (05/21 0547) SpO2:  [94 %-98 %] 94 % (05/21 0547)  Intake/Output from previous day: 05/20 0701 - 05/21 0700 In: 960 [P.O.:960] Out: -  Intake/Output this shift:     Basename 01/21/12 0820 01/20/12 0457  HGB 11.3* 8.0*    Basename 01/21/12 0820 01/20/12 0457  WBC 6.4 5.7  RBC 3.91 2.78*  HCT 34.8* 25.1*  PLT 360 345    Basename 01/22/12 1102 01/21/12 0820  NA 132* 131*  K 3.3* 3.8  CL 88* 89*  CO2 35* 33*  BUN 11 10  CREATININE 0.91 0.87  GLUCOSE 124* 139*  CALCIUM 9.8 9.4   No results found for this basename: LABPT:2,INR:2 in the last 72 hours  Neurovascular intact Sensation intact distally Intact pulses distally Dorsiflexion/Plantar flexion intact  Assessment/Plan: 4 Days Post-Op Procedure(s) (LRB): EXCISIONAL TOTAL KNEE ARTHROPLASTY WITH ANTIBIOTIC SPACERS (Right) Discharge home with home health Confirmed with ID, pt ok to d/c home on po levaquin f/u with them in 4 wks Lovenox bid for 2-3 wks post op DVT proph Discussed pain meds for d/c rx will be given She was told to contact the office if having any trouble at home, otherwise will f/u in 2 wks for staple removal   Benjy Kana 01/22/2012, 12:17 PM

## 2012-01-23 DIAGNOSIS — L8991 Pressure ulcer of unspecified site, stage 1: Secondary | ICD-10-CM | POA: Diagnosis not present

## 2012-01-23 DIAGNOSIS — R269 Unspecified abnormalities of gait and mobility: Secondary | ICD-10-CM | POA: Diagnosis not present

## 2012-01-23 DIAGNOSIS — D62 Acute posthemorrhagic anemia: Secondary | ICD-10-CM | POA: Diagnosis not present

## 2012-01-23 DIAGNOSIS — T847XXA Infection and inflammatory reaction due to other internal orthopedic prosthetic devices, implants and grafts, initial encounter: Secondary | ICD-10-CM | POA: Diagnosis not present

## 2012-01-23 DIAGNOSIS — Z4789 Encounter for other orthopedic aftercare: Secondary | ICD-10-CM | POA: Diagnosis not present

## 2012-01-23 DIAGNOSIS — L89309 Pressure ulcer of unspecified buttock, unspecified stage: Secondary | ICD-10-CM | POA: Diagnosis not present

## 2012-01-23 LAB — ANAEROBIC CULTURE

## 2012-01-24 DIAGNOSIS — L89309 Pressure ulcer of unspecified buttock, unspecified stage: Secondary | ICD-10-CM | POA: Diagnosis not present

## 2012-01-24 DIAGNOSIS — Z4789 Encounter for other orthopedic aftercare: Secondary | ICD-10-CM | POA: Diagnosis not present

## 2012-01-24 DIAGNOSIS — L8991 Pressure ulcer of unspecified site, stage 1: Secondary | ICD-10-CM | POA: Diagnosis not present

## 2012-01-24 DIAGNOSIS — R269 Unspecified abnormalities of gait and mobility: Secondary | ICD-10-CM | POA: Diagnosis not present

## 2012-01-24 DIAGNOSIS — D62 Acute posthemorrhagic anemia: Secondary | ICD-10-CM | POA: Diagnosis not present

## 2012-01-24 LAB — CULTURE, BLOOD (ROUTINE X 2)
Culture  Setup Time: 201305170131
Culture: NO GROWTH

## 2012-01-25 DIAGNOSIS — L89309 Pressure ulcer of unspecified buttock, unspecified stage: Secondary | ICD-10-CM | POA: Diagnosis not present

## 2012-01-25 DIAGNOSIS — R269 Unspecified abnormalities of gait and mobility: Secondary | ICD-10-CM | POA: Diagnosis not present

## 2012-01-25 DIAGNOSIS — L8991 Pressure ulcer of unspecified site, stage 1: Secondary | ICD-10-CM | POA: Diagnosis not present

## 2012-01-25 DIAGNOSIS — D62 Acute posthemorrhagic anemia: Secondary | ICD-10-CM | POA: Diagnosis not present

## 2012-01-25 DIAGNOSIS — Z4789 Encounter for other orthopedic aftercare: Secondary | ICD-10-CM | POA: Diagnosis not present

## 2012-01-25 LAB — POCT I-STAT 4, (NA,K, GLUC, HGB,HCT)
Glucose, Bld: 139 mg/dL — ABNORMAL HIGH (ref 70–99)
HCT: 28 % — ABNORMAL LOW (ref 36.0–46.0)
Potassium: 2.9 mEq/L — ABNORMAL LOW (ref 3.5–5.1)

## 2012-01-29 DIAGNOSIS — D62 Acute posthemorrhagic anemia: Secondary | ICD-10-CM | POA: Diagnosis not present

## 2012-01-29 DIAGNOSIS — Z4789 Encounter for other orthopedic aftercare: Secondary | ICD-10-CM | POA: Diagnosis not present

## 2012-01-29 DIAGNOSIS — L8991 Pressure ulcer of unspecified site, stage 1: Secondary | ICD-10-CM | POA: Diagnosis not present

## 2012-01-29 DIAGNOSIS — L89309 Pressure ulcer of unspecified buttock, unspecified stage: Secondary | ICD-10-CM | POA: Diagnosis not present

## 2012-01-29 DIAGNOSIS — R269 Unspecified abnormalities of gait and mobility: Secondary | ICD-10-CM | POA: Diagnosis not present

## 2012-01-30 DIAGNOSIS — D62 Acute posthemorrhagic anemia: Secondary | ICD-10-CM | POA: Diagnosis not present

## 2012-01-30 DIAGNOSIS — L8991 Pressure ulcer of unspecified site, stage 1: Secondary | ICD-10-CM | POA: Diagnosis not present

## 2012-01-30 DIAGNOSIS — R269 Unspecified abnormalities of gait and mobility: Secondary | ICD-10-CM | POA: Diagnosis not present

## 2012-01-30 DIAGNOSIS — Z4789 Encounter for other orthopedic aftercare: Secondary | ICD-10-CM | POA: Diagnosis not present

## 2012-01-30 DIAGNOSIS — L89309 Pressure ulcer of unspecified buttock, unspecified stage: Secondary | ICD-10-CM | POA: Diagnosis not present

## 2012-01-31 DIAGNOSIS — Z4789 Encounter for other orthopedic aftercare: Secondary | ICD-10-CM | POA: Diagnosis not present

## 2012-01-31 DIAGNOSIS — D62 Acute posthemorrhagic anemia: Secondary | ICD-10-CM | POA: Diagnosis not present

## 2012-01-31 DIAGNOSIS — L89309 Pressure ulcer of unspecified buttock, unspecified stage: Secondary | ICD-10-CM | POA: Diagnosis not present

## 2012-01-31 DIAGNOSIS — L8991 Pressure ulcer of unspecified site, stage 1: Secondary | ICD-10-CM | POA: Diagnosis not present

## 2012-01-31 DIAGNOSIS — R269 Unspecified abnormalities of gait and mobility: Secondary | ICD-10-CM | POA: Diagnosis not present

## 2012-02-01 DIAGNOSIS — L8991 Pressure ulcer of unspecified site, stage 1: Secondary | ICD-10-CM | POA: Diagnosis not present

## 2012-02-01 DIAGNOSIS — L89309 Pressure ulcer of unspecified buttock, unspecified stage: Secondary | ICD-10-CM | POA: Diagnosis not present

## 2012-02-01 DIAGNOSIS — Z4789 Encounter for other orthopedic aftercare: Secondary | ICD-10-CM | POA: Diagnosis not present

## 2012-02-01 DIAGNOSIS — D62 Acute posthemorrhagic anemia: Secondary | ICD-10-CM | POA: Diagnosis not present

## 2012-02-01 DIAGNOSIS — R269 Unspecified abnormalities of gait and mobility: Secondary | ICD-10-CM | POA: Diagnosis not present

## 2012-02-04 DIAGNOSIS — Z4789 Encounter for other orthopedic aftercare: Secondary | ICD-10-CM | POA: Diagnosis not present

## 2012-02-04 DIAGNOSIS — L8991 Pressure ulcer of unspecified site, stage 1: Secondary | ICD-10-CM | POA: Diagnosis not present

## 2012-02-04 DIAGNOSIS — D62 Acute posthemorrhagic anemia: Secondary | ICD-10-CM | POA: Diagnosis not present

## 2012-02-04 DIAGNOSIS — T847XXA Infection and inflammatory reaction due to other internal orthopedic prosthetic devices, implants and grafts, initial encounter: Secondary | ICD-10-CM | POA: Diagnosis not present

## 2012-02-04 DIAGNOSIS — L89309 Pressure ulcer of unspecified buttock, unspecified stage: Secondary | ICD-10-CM | POA: Diagnosis not present

## 2012-02-04 DIAGNOSIS — R269 Unspecified abnormalities of gait and mobility: Secondary | ICD-10-CM | POA: Diagnosis not present

## 2012-02-06 DIAGNOSIS — R269 Unspecified abnormalities of gait and mobility: Secondary | ICD-10-CM | POA: Diagnosis not present

## 2012-02-06 DIAGNOSIS — Z4789 Encounter for other orthopedic aftercare: Secondary | ICD-10-CM | POA: Diagnosis not present

## 2012-02-06 DIAGNOSIS — D62 Acute posthemorrhagic anemia: Secondary | ICD-10-CM | POA: Diagnosis not present

## 2012-02-06 DIAGNOSIS — L89309 Pressure ulcer of unspecified buttock, unspecified stage: Secondary | ICD-10-CM | POA: Diagnosis not present

## 2012-02-06 DIAGNOSIS — L8991 Pressure ulcer of unspecified site, stage 1: Secondary | ICD-10-CM | POA: Diagnosis not present

## 2012-02-08 DIAGNOSIS — D62 Acute posthemorrhagic anemia: Secondary | ICD-10-CM | POA: Diagnosis not present

## 2012-02-08 DIAGNOSIS — L8991 Pressure ulcer of unspecified site, stage 1: Secondary | ICD-10-CM | POA: Diagnosis not present

## 2012-02-08 DIAGNOSIS — Z4789 Encounter for other orthopedic aftercare: Secondary | ICD-10-CM | POA: Diagnosis not present

## 2012-02-08 DIAGNOSIS — R269 Unspecified abnormalities of gait and mobility: Secondary | ICD-10-CM | POA: Diagnosis not present

## 2012-02-08 DIAGNOSIS — L89309 Pressure ulcer of unspecified buttock, unspecified stage: Secondary | ICD-10-CM | POA: Diagnosis not present

## 2012-02-12 DIAGNOSIS — Z4789 Encounter for other orthopedic aftercare: Secondary | ICD-10-CM | POA: Diagnosis not present

## 2012-02-12 DIAGNOSIS — L8991 Pressure ulcer of unspecified site, stage 1: Secondary | ICD-10-CM | POA: Diagnosis not present

## 2012-02-12 DIAGNOSIS — R269 Unspecified abnormalities of gait and mobility: Secondary | ICD-10-CM | POA: Diagnosis not present

## 2012-02-12 DIAGNOSIS — L89309 Pressure ulcer of unspecified buttock, unspecified stage: Secondary | ICD-10-CM | POA: Diagnosis not present

## 2012-02-12 DIAGNOSIS — D62 Acute posthemorrhagic anemia: Secondary | ICD-10-CM | POA: Diagnosis not present

## 2012-02-13 DIAGNOSIS — L89309 Pressure ulcer of unspecified buttock, unspecified stage: Secondary | ICD-10-CM | POA: Diagnosis not present

## 2012-02-13 DIAGNOSIS — R269 Unspecified abnormalities of gait and mobility: Secondary | ICD-10-CM | POA: Diagnosis not present

## 2012-02-13 DIAGNOSIS — Z4789 Encounter for other orthopedic aftercare: Secondary | ICD-10-CM | POA: Diagnosis not present

## 2012-02-13 DIAGNOSIS — D62 Acute posthemorrhagic anemia: Secondary | ICD-10-CM | POA: Diagnosis not present

## 2012-02-13 DIAGNOSIS — L8991 Pressure ulcer of unspecified site, stage 1: Secondary | ICD-10-CM | POA: Diagnosis not present

## 2012-02-15 DIAGNOSIS — D62 Acute posthemorrhagic anemia: Secondary | ICD-10-CM | POA: Diagnosis not present

## 2012-02-15 DIAGNOSIS — Z4789 Encounter for other orthopedic aftercare: Secondary | ICD-10-CM | POA: Diagnosis not present

## 2012-02-15 DIAGNOSIS — R269 Unspecified abnormalities of gait and mobility: Secondary | ICD-10-CM | POA: Diagnosis not present

## 2012-02-15 DIAGNOSIS — L89309 Pressure ulcer of unspecified buttock, unspecified stage: Secondary | ICD-10-CM | POA: Diagnosis not present

## 2012-02-15 DIAGNOSIS — L8991 Pressure ulcer of unspecified site, stage 1: Secondary | ICD-10-CM | POA: Diagnosis not present

## 2012-02-19 NOTE — Telephone Encounter (Signed)
Opened in error

## 2012-02-21 ENCOUNTER — Inpatient Hospital Stay: Payer: Medicare Other | Admitting: Internal Medicine

## 2012-02-25 ENCOUNTER — Emergency Department (HOSPITAL_BASED_OUTPATIENT_CLINIC_OR_DEPARTMENT_OTHER)
Admission: EM | Admit: 2012-02-25 | Discharge: 2012-02-25 | Disposition: A | Discharge status: Home / Self Care | Payer: Medicare Other | Attending: Emergency Medicine | Admitting: Emergency Medicine

## 2012-02-25 ENCOUNTER — Encounter (HOSPITAL_BASED_OUTPATIENT_CLINIC_OR_DEPARTMENT_OTHER): Payer: Self-pay | Admitting: Family Medicine

## 2012-02-25 DIAGNOSIS — R630 Anorexia: Secondary | ICD-10-CM | POA: Diagnosis not present

## 2012-02-25 DIAGNOSIS — K5900 Constipation, unspecified: Secondary | ICD-10-CM | POA: Diagnosis not present

## 2012-02-25 DIAGNOSIS — K649 Unspecified hemorrhoids: Secondary | ICD-10-CM | POA: Diagnosis not present

## 2012-02-25 MED ORDER — MINERAL OIL RE ENEM
ENEMA | RECTAL | Status: AC
  Administered 2012-02-25: 1 via RECTAL
  Filled 2012-02-25: qty 1

## 2012-02-25 MED ORDER — MINERAL OIL RE ENEM
1.0000 | ENEMA | Freq: Once | RECTAL | Status: AC
  Administered 2012-02-25: 1 via RECTAL

## 2012-02-25 MED ORDER — DIAZEPAM 5 MG/ML IJ SOLN
5.0000 mg | Freq: Once | INTRAMUSCULAR | Status: AC
  Administered 2012-02-25: 5 mg via INTRAMUSCULAR
  Filled 2012-02-25: qty 2

## 2012-02-25 MED ORDER — TRAMADOL HCL 50 MG PO TABS
50.0000 mg | ORAL_TABLET | Freq: Once | ORAL | Status: AC
  Administered 2012-02-25: 50 mg via ORAL
  Filled 2012-02-25: qty 1

## 2012-02-25 NOTE — ED Notes (Signed)
Spoke with Verline Lema PA about Pt. And gave update on Pt. Status.

## 2012-02-25 NOTE — ED Notes (Addendum)
Pt sts she has hemorrhoids and has been constipated x 1 wk. Pt brought from home via [facility]. Pt sts she is on "heavy duty pain medication and is in pain management for back and knee pain". Pt sts she has passed small amounts of stool.

## 2012-02-25 NOTE — ED Notes (Signed)
Pt. To go home via [facility]

## 2012-02-25 NOTE — Discharge Instructions (Signed)

## 2012-02-25 NOTE — ED Provider Notes (Signed)
History     CSN: [id number]  Arrival date & time 02/25/12  1200   First MD Initiated Contact with Patient 02/25/12 1341      Chief Complaint  Patient presents with  . Constipation    (Consider location/radiation/quality/duration/timing/severity/associated sxs/prior treatment) HPI  Past Medical History  Diagnosis Date  . Angina   . Headache   . Hypoglycemia   . Fracture 2008    back  . Macular degeneration of both eyes   . Glaucoma   . Pneumonia   . Hypothyroidism   . Blood transfusion   . Anemia   . GERD (gastroesophageal reflux disease)   . H/O hiatal hernia   . Arthritis   . Chronic back pain   . Thyroid cancer 1986  . Shortness of breath 11/08/11    "here lately, all the time"  . Exertional dyspnea 01/17/12  . Osteomyelitis 11/2011    right knee    Past Surgical History  Procedure Date  . Thyroidectomy 1986  . Shoulder arthroscopy w/ rotator cuff repair   . Trigger finger release     right  thumb  . Abdominal hysterectomy 1963  . Carpal tunnel release     bilaterally  . Tonsillectomy and adenoidectomy   . Appendectomy   . Dilation and curettage of uterus   . Cataract extraction w/ intraocular lens  implant, bilateral   . Joint replacement 2005, 2006, 2012, 11/10/11    left; left; right; right; right  . Goiter removed 1980's    "when I was young"  . Fracture surgery     "broke back"  . Hand or 09/2011    "left hand; bones were coming together"  . I&d knee with poly exchange 11/10/2011    Procedure: IRRIGATION AND DEBRIDEMENT KNEE WITH POLY EXCHANGE;  Surgeon: Thera Flake., MD;  Location: [facility] OR;  Service: Orthopedics;  Laterality: Right;  . Post polyethelene exchange 11/09/11    RLE  . Back surgery     "3 back OR's;"; S/P "fractured back 2008"  . Peripherally inserted central catheter insertion 11/2011    "removed 12/2011; LUE"    History reviewed. No pertinent family history.  History  Substance Use Topics  . Smoking status: Never Smoker   .  Smokeless tobacco: Never Used  . Alcohol Use: No    OB History    Grav Para Term Preterm Abortions TAB SAB Ect Mult Living                  Review of Systems  Allergies  Demerol; Lorazepam; Penicillins; Tetracyclines & related; and Vancomycin  Home Medications   Current Outpatient Rx  Name Route Sig Dispense Refill  . DIAZEPAM 5 MG PO TABS Oral Take 1 tablet by mouth At bedtime as needed. For sleep    . DORZOLAMIDE HCL-TIMOLOL MAL 22.3-6.8 MG/ML OP SOLN Both Eyes Place 1 drop into both eyes 2 (two) times daily.     Marland Kitchen GABAPENTIN 300 MG PO CAPS Oral Take 300 mg by mouth at bedtime.     Marland Kitchen LATANOPROST 0.005 % OP SOLN Both Eyes Place 1 drop into both eyes at bedtime.     Marland Kitchen LEVOTHYROXINE SODIUM 100 MCG PO TABS Oral Take 100 mcg by mouth daily.     . OXYCODONE HCL ER 15 MG PO TB12 Oral Take 1 tablet (15 mg total) by mouth every 12 (twelve) hours. 30 tablet 0  . POTASSIUM CHLORIDE CRYS ER 20 MEQ PO TBCR Oral Take 2  tablets (40 mEq total) by mouth 2 (two) times daily. 30 tablet 1  . PROPRANOLOL HCL 20 MG PO TABS Oral Take 20 mg by mouth daily. Takes for migraines.    . TRIAMTERENE-HCTZ 37.5-25 MG PO TABS Oral Take 0.5 tablets by mouth daily.      BP 110/55  Pulse 83  Temp 97.6 F (36.4 C) (Oral)  Resp 20  Ht 5\' 4"  (1.626 m)  Wt 135 lb (61.236 kg)  BMI 23.17 kg/m2  SpO2 97%  Physical Exam  ED Course  Procedures (including critical care time)  Labs Reviewed - No data to display No results found.   1. Constipation       MDM  Pt had some relief.   Rn reports pt passed stool after enema.          Lonia Skinner [place], [place] 02/25/12 1718

## 2012-02-25 NOTE — ED Provider Notes (Signed)
History     CSN: [id number]  Arrival date & time 02/25/12  1200   First MD Initiated Contact with Patient 02/25/12 1341      Chief Complaint  Patient presents with  . Constipation    (Consider location/radiation/quality/duration/timing/severity/associated sxs/prior treatment) HPI Comments: Pt with constipation for more than 1 week.  Small BM's recently taking laxatives, but having sig rectal pain now, due to straining.  No N/V.  Mild crampy pains in abd on occasion.  No fevers, chills.  She is taking oxycodone due to several knee surgeries following infection of right knee with Dr. Madelon Lips.  She has taken stool softeners before, but quit taking because not working.  She admits isn't drinking much water.  Appetite is down.    Patient is a 78 y.o. female presenting with constipation. The history is provided by the patient and the spouse.  Constipation  Pertinent negatives include no fever, no diarrhea and no vomiting.    Past Medical History  Diagnosis Date  . Angina   . Headache   . Hypoglycemia   . Fracture 2008    back  . Macular degeneration of both eyes   . Glaucoma   . Pneumonia   . Hypothyroidism   . Blood transfusion   . Anemia   . GERD (gastroesophageal reflux disease)   . H/O hiatal hernia   . Arthritis   . Chronic back pain   . Thyroid cancer 1986  . Shortness of breath 11/08/11    "here lately, all the time"  . Exertional dyspnea 01/17/12  . Osteomyelitis 11/2011    right knee    Past Surgical History  Procedure Date  . Thyroidectomy 1986  . Shoulder arthroscopy w/ rotator cuff repair   . Trigger finger release     right  thumb  . Abdominal hysterectomy 1963  . Carpal tunnel release     bilaterally  . Tonsillectomy and adenoidectomy   . Appendectomy   . Dilation and curettage of uterus   . Cataract extraction w/ intraocular lens  implant, bilateral   . Joint replacement 2005, 2006, 2012, 11/10/11    left; left; right; right; right  . Goiter removed  1980's    "when I was young"  . Fracture surgery     "broke back"  . Hand or 09/2011    "left hand; bones were coming together"  . I&d knee with poly exchange 11/10/2011    Procedure: IRRIGATION AND DEBRIDEMENT KNEE WITH POLY EXCHANGE;  Surgeon: Thera Flake., MD;  Location: [facility] OR;  Service: Orthopedics;  Laterality: Right;  . Post polyethelene exchange 11/09/11    RLE  . Back surgery     "3 back OR's;"; S/P "fractured back 2008"  . Peripherally inserted central catheter insertion 11/2011    "removed 12/2011; LUE"    History reviewed. No pertinent family history.  History  Substance Use Topics  . Smoking status: Never Smoker   . Smokeless tobacco: Never Used  . Alcohol Use: No    OB History    Grav Para Term Preterm Abortions TAB SAB Ect Mult Living                  Review of Systems  Constitutional: Positive for appetite change. Negative for fever and chills.  Gastrointestinal: Positive for constipation. Negative for vomiting and diarrhea.  Musculoskeletal: Negative for back pain.  Neurological: Negative for dizziness and light-headedness.    Allergies  Demerol; Lorazepam; Penicillins; Tetracyclines & related; and  Vancomycin  Home Medications   Current Outpatient Rx  Name Route Sig Dispense Refill  . DIAZEPAM 5 MG PO TABS Oral Take 1 tablet by mouth At bedtime as needed. For sleep    . DORZOLAMIDE HCL-TIMOLOL MAL 22.3-6.8 MG/ML OP SOLN Both Eyes Place 1 drop into both eyes 2 (two) times daily.     Marland Kitchen GABAPENTIN 300 MG PO CAPS Oral Take 300 mg by mouth at bedtime.     Marland Kitchen LATANOPROST 0.005 % OP SOLN Both Eyes Place 1 drop into both eyes at bedtime.     Marland Kitchen LEVOTHYROXINE SODIUM 100 MCG PO TABS Oral Take 100 mcg by mouth daily.     . OXYCODONE HCL ER 15 MG PO TB12 Oral Take 1 tablet (15 mg total) by mouth every 12 (twelve) hours. 30 tablet 0  . POTASSIUM CHLORIDE CRYS ER 20 MEQ PO TBCR Oral Take 2 tablets (40 mEq total) by mouth 2 (two) times daily. 30 tablet 1  . PROPRANOLOL  HCL 20 MG PO TABS Oral Take 20 mg by mouth daily. Takes for migraines.    . TRIAMTERENE-HCTZ 37.5-25 MG PO TABS Oral Take 0.5 tablets by mouth daily.      BP 121/57  Pulse 61  Temp 97.6 F (36.4 C) (Oral)  Resp 20  Ht 5\' 4"  (1.626 m)  Wt 135 lb (61.236 kg)  BMI 23.17 kg/m2  SpO2 100%  Physical Exam  Nursing note and vitals reviewed. Constitutional: She appears well-developed and well-nourished. No distress.  HENT:  Head: Normocephalic and atraumatic.  Pulmonary/Chest: Effort normal.  Abdominal: Soft. Normal appearance. She exhibits no distension. There is tenderness in the left upper quadrant and left lower quadrant. There is no rebound and no guarding.    Genitourinary: Rectal exam shows tenderness. Rectal exam shows anal tone normal.          With chaperone present, sig stool that is firm in consistency, not hard impaction on rectal exam.    Skin: Skin is warm. She is not diaphoretic.    ED Course  Fecal disimpaction Date/Time: 02/25/2012 2:41 PM Performed by: Lear Ng. Authorized by: Lear Ng Consent: Verbal consent obtained. Consent given by: patient and spouse Patient understanding: patient states understanding of the procedure being performed Patient consent: the patient's understanding of the procedure matches consent given Patient identity confirmed: verbally with patient and arm band Time out: Immediately prior to procedure a "time out" was called to verify the correct patient, procedure, equipment, support staff and site/side marked as required. Local anesthesia used: no Patient sedated: yes Sedation type: anxiolysis Sedatives: diazepam Patient tolerance: Patient tolerated the procedure well with no immediate complications. Comments: Some stool removed with disimpaction.  Followed by mineral oil enema   (including critical care time)  Labs Reviewed - No data to display No results found.   1. Constipation     After attempted partial  disimpaction and enema, pt is trying to have BM now.  Will refer to PCP who can refer to GI or surgery as needed.  Pt is told to take laxatives, try enema, avodi narcotic medication as this will make symptoms worse.  Pt is signed out to Dr. Preston Fleeting and [facility] Keenan Bachelor.    MDM  Pt with severe constipation and some hemorrhoids.          Gavin Pound. Mirza Fessel, MD 02/25/12 4782

## 2012-02-25 NOTE — ED Notes (Signed)
Pt. Had small BM with reports of still feeling like she needs to go.  Pt. Has no distress noted.  Teaching done about drinking water and taking stool softners with the pain meds.

## 2012-02-25 NOTE — ED Notes (Signed)
Pt. Still waiting for PTAR

## 2012-02-25 NOTE — ED Provider Notes (Signed)
Medical screening examination/treatment/procedure(s) were conducted as a shared visit with non-physician practitioner(s) and myself.  I personally evaluated the patient during the encounter  Please see my original H&P.  Pt re-evaluated and disposition provided by [facility] Domenic Schwab. Oletta Lamas, MD 02/25/12 7253

## 2012-02-28 DIAGNOSIS — L89309 Pressure ulcer of unspecified buttock, unspecified stage: Secondary | ICD-10-CM | POA: Diagnosis not present

## 2012-02-28 DIAGNOSIS — Z4789 Encounter for other orthopedic aftercare: Secondary | ICD-10-CM | POA: Diagnosis not present

## 2012-02-28 DIAGNOSIS — L8991 Pressure ulcer of unspecified site, stage 1: Secondary | ICD-10-CM | POA: Diagnosis not present

## 2012-02-28 DIAGNOSIS — R269 Unspecified abnormalities of gait and mobility: Secondary | ICD-10-CM | POA: Diagnosis not present

## 2012-02-28 DIAGNOSIS — D62 Acute posthemorrhagic anemia: Secondary | ICD-10-CM | POA: Diagnosis not present

## 2012-03-05 DIAGNOSIS — L8991 Pressure ulcer of unspecified site, stage 1: Secondary | ICD-10-CM | POA: Diagnosis not present

## 2012-03-05 DIAGNOSIS — R269 Unspecified abnormalities of gait and mobility: Secondary | ICD-10-CM | POA: Diagnosis not present

## 2012-03-05 DIAGNOSIS — D62 Acute posthemorrhagic anemia: Secondary | ICD-10-CM | POA: Diagnosis not present

## 2012-03-05 DIAGNOSIS — Z4789 Encounter for other orthopedic aftercare: Secondary | ICD-10-CM | POA: Diagnosis not present

## 2012-03-05 DIAGNOSIS — L89309 Pressure ulcer of unspecified buttock, unspecified stage: Secondary | ICD-10-CM | POA: Diagnosis not present

## 2012-03-11 DIAGNOSIS — T847XXA Infection and inflammatory reaction due to other internal orthopedic prosthetic devices, implants and grafts, initial encounter: Secondary | ICD-10-CM | POA: Diagnosis not present

## 2012-03-13 DIAGNOSIS — D62 Acute posthemorrhagic anemia: Secondary | ICD-10-CM | POA: Diagnosis not present

## 2012-03-13 DIAGNOSIS — L89309 Pressure ulcer of unspecified buttock, unspecified stage: Secondary | ICD-10-CM | POA: Diagnosis not present

## 2012-03-13 DIAGNOSIS — Z4789 Encounter for other orthopedic aftercare: Secondary | ICD-10-CM | POA: Diagnosis not present

## 2012-03-13 DIAGNOSIS — L8991 Pressure ulcer of unspecified site, stage 1: Secondary | ICD-10-CM | POA: Diagnosis not present

## 2012-03-13 DIAGNOSIS — R269 Unspecified abnormalities of gait and mobility: Secondary | ICD-10-CM | POA: Diagnosis not present

## 2012-03-25 DIAGNOSIS — T847XXA Infection and inflammatory reaction due to other internal orthopedic prosthetic devices, implants and grafts, initial encounter: Secondary | ICD-10-CM | POA: Diagnosis not present

## 2012-03-28 DIAGNOSIS — IMO0002 Reserved for concepts with insufficient information to code with codable children: Secondary | ICD-10-CM | POA: Diagnosis not present

## 2012-03-28 DIAGNOSIS — M961 Postlaminectomy syndrome, not elsewhere classified: Secondary | ICD-10-CM | POA: Diagnosis not present

## 2012-03-28 DIAGNOSIS — M129 Arthropathy, unspecified: Secondary | ICD-10-CM | POA: Diagnosis not present

## 2012-03-28 DIAGNOSIS — M2580 Other specified joint disorders, unspecified joint: Secondary | ICD-10-CM | POA: Diagnosis not present

## 2012-04-01 ENCOUNTER — Encounter: Payer: Self-pay | Admitting: Emergency Medicine

## 2012-04-20 ENCOUNTER — Other Ambulatory Visit: Payer: Self-pay | Admitting: Orthopedic Surgery

## 2012-04-23 ENCOUNTER — Encounter (HOSPITAL_COMMUNITY)
Admission: RE | Admit: 2012-04-23 | Discharge: 2012-04-23 | Disposition: A | Discharge status: Home / Self Care | Payer: Medicare Other | Source: Ambulatory Visit | Attending: Orthopedic Surgery | Admitting: Orthopedic Surgery

## 2012-04-23 ENCOUNTER — Encounter (HOSPITAL_COMMUNITY): Payer: Self-pay

## 2012-04-23 HISTORY — DX: Essential (primary) hypertension: I10

## 2012-04-23 LAB — CBC WITH DIFFERENTIAL/PLATELET
Basophils Absolute: 0 10*3/uL (ref 0.0–0.1)
Basophils Relative: 0 % (ref 0–1)
Lymphocytes Relative: 26 % (ref 12–46)
MCHC: 33.2 g/dL (ref 30.0–36.0)
Neutro Abs: 2.5 10*3/uL (ref 1.7–7.7)
Platelets: 286 10*3/uL (ref 150–400)
RDW: 15.6 % — ABNORMAL HIGH (ref 11.5–15.5)
WBC: 4.5 10*3/uL (ref 4.0–10.5)

## 2012-04-23 LAB — PROTIME-INR: INR: 1 (ref 0.00–1.49)

## 2012-04-23 LAB — SURGICAL PCR SCREEN: MRSA, PCR: NEGATIVE

## 2012-04-23 LAB — APTT: aPTT: 37 seconds (ref 24–37)

## 2012-04-23 LAB — COMPREHENSIVE METABOLIC PANEL
ALT: 12 U/L (ref 0–35)
AST: 21 U/L (ref 0–37)
Albumin: 3.7 g/dL (ref 3.5–5.2)
CO2: 34 mEq/L — ABNORMAL HIGH (ref 19–32)
Calcium: 9.5 mg/dL (ref 8.4–10.5)
Chloride: 98 mEq/L (ref 96–112)
Creatinine, Ser: 0.83 mg/dL (ref 0.50–1.10)
GFR calc non Af Amer: 63 mL/min — ABNORMAL LOW (ref >90)
Sodium: 139 mEq/L (ref 135–145)
Total Bilirubin: 0.3 mg/dL (ref 0.3–1.2)

## 2012-04-23 NOTE — Progress Notes (Addendum)
Blood bank called, pt will need repeat T&S DOS - is antibody positive.   Pt unable to provide urine sample at PAT.  Pt is bringing Incentive spirometer and compression stockings from home.   There is an ECHO in the system for this pt, however pt denies ever having this procedure, denies any cardiac hx and does not see a cardiologist.

## 2012-04-23 NOTE — Pre-Procedure Instructions (Signed)
20 MICHA ERCK  04/23/2012   Your procedure is scheduled on:  Monday August 26  Report to [facility] at 8:00 AM.  Call this number if you have problems the morning of surgery: [phone]   Remember:   Do not eat or drink :After Midnight.    Take these medicines the morning of surgery with A SIP OF WATER: Oxycodone, Synthroid, Propranolol (inderal). May use eye drops. 3   Do not wear jewelry, make-up or nail polish.  Do not wear lotions, powders, or perfumes. You may wear deodorant.  Do not shave 48 hours prior to surgery. Men may shave face and neck.  Do not bring valuables to the hospital.  Contacts, dentures or bridgework may not be worn into surgery.  Leave suitcase in the car. After surgery it may be brought to your room.  For patients admitted to the hospital, checkout time is 11:00 AM the day of discharge.   Patients discharged the day of surgery will not be allowed to drive home.  Name and phone number of your driver: NA  Special Instructions: Incentive Spirometry - Practice and bring it with you on the day of surgery. and CHG Shower Use Special Wash: 1/2 bottle night before surgery and 1/2 bottle morning of surgery.   Please read over the following fact sheets that you were given: Pain Booklet, Coughing and Deep Breathing, Blood Transfusion Information and Surgical Site Infection Prevention

## 2012-04-27 MED ORDER — SODIUM CHLORIDE 0.9 % IV SOLN: INTRAVENOUS | Status: DC

## 2012-04-27 MED ORDER — CHLORHEXIDINE GLUCONATE 4 % EX LIQD: 60.0000 mL | Freq: Once | CUTANEOUS | Status: DC

## 2012-04-27 MED ORDER — CLINDAMYCIN PHOSPHATE 900 MG/50ML IV SOLN
900.0000 mg | INTRAVENOUS | Status: AC
  Administered 2012-04-28: 900 mg via INTRAVENOUS
  Filled 2012-04-27: qty 50

## 2012-04-28 ENCOUNTER — Encounter (HOSPITAL_COMMUNITY): Payer: Self-pay | Admitting: Anesthesiology

## 2012-04-28 ENCOUNTER — Ambulatory Visit (HOSPITAL_COMMUNITY): Payer: Medicare Other

## 2012-04-28 ENCOUNTER — Ambulatory Visit (HOSPITAL_COMMUNITY): Payer: Medicare Other | Admitting: Anesthesiology

## 2012-04-28 ENCOUNTER — Encounter (HOSPITAL_COMMUNITY)
Admission: RE | Disposition: A | Discharge status: Home Health | Payer: Self-pay | Source: Ambulatory Visit | Attending: Orthopedic Surgery

## 2012-04-28 ENCOUNTER — Encounter (HOSPITAL_COMMUNITY): Payer: Self-pay | Admitting: *Deleted

## 2012-04-28 ENCOUNTER — Inpatient Hospital Stay (HOSPITAL_COMMUNITY)
Admission: RE | Admit: 2012-04-28 | Discharge: 2012-04-30 | DRG: 467 | Disposition: A | Discharge status: Home Health | Payer: Medicare Other | Source: Ambulatory Visit | Attending: Orthopedic Surgery | Admitting: Orthopedic Surgery

## 2012-04-28 DIAGNOSIS — Z79899 Other long term (current) drug therapy: Secondary | ICD-10-CM | POA: Diagnosis not present

## 2012-04-28 DIAGNOSIS — Z880 Allergy status to penicillin: Secondary | ICD-10-CM | POA: Diagnosis not present

## 2012-04-28 DIAGNOSIS — G8918 Other acute postprocedural pain: Secondary | ICD-10-CM | POA: Diagnosis not present

## 2012-04-28 DIAGNOSIS — Y831 Surgical operation with implant of artificial internal device as the cause of abnormal reaction of the patient, or of later complication, without mention of misadventure at the time of the procedure: Secondary | ICD-10-CM | POA: Diagnosis present

## 2012-04-28 DIAGNOSIS — Z471 Aftercare following joint replacement surgery: Secondary | ICD-10-CM | POA: Diagnosis not present

## 2012-04-28 DIAGNOSIS — Z881 Allergy status to other antibiotic agents status: Secondary | ICD-10-CM

## 2012-04-28 DIAGNOSIS — T8450XA Infection and inflammatory reaction due to unspecified internal joint prosthesis, initial encounter: Principal | ICD-10-CM | POA: Diagnosis present

## 2012-04-28 DIAGNOSIS — H409 Unspecified glaucoma: Secondary | ICD-10-CM | POA: Diagnosis present

## 2012-04-28 DIAGNOSIS — I10 Essential (primary) hypertension: Secondary | ICD-10-CM | POA: Diagnosis present

## 2012-04-28 DIAGNOSIS — K219 Gastro-esophageal reflux disease without esophagitis: Secondary | ICD-10-CM | POA: Diagnosis present

## 2012-04-28 DIAGNOSIS — Z7901 Long term (current) use of anticoagulants: Secondary | ICD-10-CM

## 2012-04-28 DIAGNOSIS — Z85850 Personal history of malignant neoplasm of thyroid: Secondary | ICD-10-CM | POA: Diagnosis not present

## 2012-04-28 DIAGNOSIS — D62 Acute posthemorrhagic anemia: Secondary | ICD-10-CM | POA: Diagnosis not present

## 2012-04-28 DIAGNOSIS — E039 Hypothyroidism, unspecified: Secondary | ICD-10-CM | POA: Diagnosis present

## 2012-04-28 DIAGNOSIS — Y92009 Unspecified place in unspecified non-institutional (private) residence as the place of occurrence of the external cause: Secondary | ICD-10-CM

## 2012-04-28 DIAGNOSIS — Z888 Allergy status to other drugs, medicaments and biological substances status: Secondary | ICD-10-CM | POA: Diagnosis not present

## 2012-04-28 DIAGNOSIS — Z96659 Presence of unspecified artificial knee joint: Secondary | ICD-10-CM

## 2012-04-28 DIAGNOSIS — H3530 Unspecified macular degeneration: Secondary | ICD-10-CM | POA: Diagnosis present

## 2012-04-28 DIAGNOSIS — T84019A Broken internal joint prosthesis, unspecified site, initial encounter: Secondary | ICD-10-CM | POA: Diagnosis not present

## 2012-04-28 DIAGNOSIS — M25561 Pain in right knee: Secondary | ICD-10-CM

## 2012-04-28 DIAGNOSIS — T8489XA Other specified complication of internal orthopedic prosthetic devices, implants and grafts, initial encounter: Secondary | ICD-10-CM | POA: Diagnosis not present

## 2012-04-28 DIAGNOSIS — M25569 Pain in unspecified knee: Secondary | ICD-10-CM | POA: Diagnosis not present

## 2012-04-28 HISTORY — PX: TOTAL KNEE REVISION: SHX996

## 2012-04-28 LAB — URINALYSIS, ROUTINE W REFLEX MICROSCOPIC
Bilirubin Urine: NEGATIVE
Ketones, ur: NEGATIVE mg/dL
Nitrite: NEGATIVE
Protein, ur: NEGATIVE mg/dL
Urobilinogen, UA: 0.2 mg/dL (ref 0.0–1.0)
pH: 7 (ref 5.0–8.0)

## 2012-04-28 LAB — TYPE AND SCREEN
ABO/RH(D): O POS
Antibody Screen: POSITIVE

## 2012-04-28 LAB — GLUCOSE, CAPILLARY: Glucose-Capillary: 93 mg/dL (ref 70–99)

## 2012-04-28 SURGERY — TOTAL KNEE REVISION
Anesthesia: Regional | Site: Knee | Laterality: Right | Wound class: Contaminated

## 2012-04-28 MED ORDER — ZOLPIDEM TARTRATE 5 MG PO TABS: 5.0000 mg | ORAL_TABLET | Freq: Every evening | ORAL | Status: DC | PRN

## 2012-04-28 MED ORDER — LIDOCAINE HCL (CARDIAC) 20 MG/ML IV SOLN
INTRAVENOUS | Status: DC | PRN
  Administered 2012-04-28: 80 mg via INTRAVENOUS

## 2012-04-28 MED ORDER — BUPIVACAINE-EPINEPHRINE 0.25% -1:200000 IJ SOLN
INTRAMUSCULAR | Status: DC | PRN
  Administered 2012-04-28: 30 mL

## 2012-04-28 MED ORDER — METOCLOPRAMIDE HCL 5 MG/ML IJ SOLN: 5.0000 mg | Freq: Three times a day (TID) | INTRAMUSCULAR | Status: DC | PRN

## 2012-04-28 MED ORDER — HYDROMORPHONE HCL PF 1 MG/ML IJ SOLN
INTRAMUSCULAR | Status: AC
  Filled 2012-04-28: qty 1

## 2012-04-28 MED ORDER — ONDANSETRON HCL 4 MG PO TABS: 4.0000 mg | ORAL_TABLET | Freq: Four times a day (QID) | ORAL | Status: DC | PRN

## 2012-04-28 MED ORDER — ACETAMINOPHEN 650 MG RE SUPP: 650.0000 mg | Freq: Four times a day (QID) | RECTAL | Status: DC | PRN

## 2012-04-28 MED ORDER — MEPERIDINE HCL 25 MG/ML IJ SOLN: 6.2500 mg | INTRAMUSCULAR | Status: DC | PRN

## 2012-04-28 MED ORDER — PROPOFOL 10 MG/ML IV EMUL
INTRAVENOUS | Status: DC | PRN
  Administered 2012-04-28: 30 mg via INTRAVENOUS
  Administered 2012-04-28: 130 mg via INTRAVENOUS

## 2012-04-28 MED ORDER — OXYCODONE HCL 5 MG PO TABS
5.0000 mg | ORAL_TABLET | ORAL | Status: DC | PRN
  Administered 2012-04-28 – 2012-04-30 (×6): 10 mg via ORAL
  Filled 2012-04-28 (×6): qty 2

## 2012-04-28 MED ORDER — ACETAMINOPHEN 325 MG PO TABS: 650.0000 mg | ORAL_TABLET | Freq: Four times a day (QID) | ORAL | Status: DC | PRN

## 2012-04-28 MED ORDER — DORZOLAMIDE HCL-TIMOLOL MAL 2-0.5 % OP SOLN
1.0000 [drp] | Freq: Two times a day (BID) | OPHTHALMIC | Status: DC
  Administered 2012-04-28 – 2012-04-30 (×4): 1 [drp] via OPHTHALMIC
  Filled 2012-04-28: qty 10

## 2012-04-28 MED ORDER — ACETAMINOPHEN 10 MG/ML IV SOLN
INTRAVENOUS | Status: AC
  Filled 2012-04-28: qty 100

## 2012-04-28 MED ORDER — ONDANSETRON HCL 4 MG/2ML IJ SOLN
INTRAMUSCULAR | Status: DC | PRN
  Administered 2012-04-28: 4 mg via INTRAVENOUS

## 2012-04-28 MED ORDER — LATANOPROST 0.005 % OP SOLN
1.0000 [drp] | Freq: Every day | OPHTHALMIC | Status: DC
  Administered 2012-04-28 – 2012-04-29 (×2): 1 [drp] via OPHTHALMIC
  Filled 2012-04-28: qty 2.5

## 2012-04-28 MED ORDER — DIPHENHYDRAMINE HCL 12.5 MG/5ML PO ELIX: 12.5000 mg | ORAL_SOLUTION | ORAL | Status: DC | PRN

## 2012-04-28 MED ORDER — PROPRANOLOL HCL 20 MG PO TABS
20.0000 mg | ORAL_TABLET | Freq: Every day | ORAL | Status: DC
  Administered 2012-04-29 – 2012-04-30 (×2): 20 mg via ORAL
  Filled 2012-04-28 (×2): qty 1

## 2012-04-28 MED ORDER — PROPOFOL 10 MG/ML IV BOLUS
INTRAVENOUS | Status: DC | PRN
  Administered 2012-04-28: 130 mg via INTRAVENOUS
  Administered 2012-04-28: 30 mg via INTRAVENOUS

## 2012-04-28 MED ORDER — FENTANYL CITRATE 0.05 MG/ML IJ SOLN
INTRAMUSCULAR | Status: AC
  Filled 2012-04-28: qty 2

## 2012-04-28 MED ORDER — ACETAMINOPHEN 10 MG/ML IV SOLN
1000.0000 mg | Freq: Four times a day (QID) | INTRAVENOUS | Status: AC
  Administered 2012-04-28 – 2012-04-29 (×4): 1000 mg via INTRAVENOUS
  Filled 2012-04-28 (×4): qty 100

## 2012-04-28 MED ORDER — SODIUM CHLORIDE 0.9 % IV SOLN
INTRAVENOUS | Status: DC
  Administered 2012-04-28 – 2012-04-30 (×3): via INTRAVENOUS

## 2012-04-28 MED ORDER — DIAZEPAM 5 MG PO TABS: 5.0000 mg | ORAL_TABLET | Freq: Three times a day (TID) | ORAL | Status: DC | PRN

## 2012-04-28 MED ORDER — PHENOL 1.4 % MT LIQD: 1.0000 | OROMUCOSAL | Status: DC | PRN

## 2012-04-28 MED ORDER — METHOCARBAMOL 100 MG/ML IJ SOLN
500.0000 mg | Freq: Four times a day (QID) | INTRAVENOUS | Status: DC | PRN
  Filled 2012-04-28: qty 5

## 2012-04-28 MED ORDER — LACTATED RINGERS IV SOLN
INTRAVENOUS | Status: DC
  Administered 2012-04-28: 10:00:00 via INTRAVENOUS

## 2012-04-28 MED ORDER — HYDROMORPHONE HCL PF 1 MG/ML IJ SOLN
1.0000 mg | INTRAMUSCULAR | Status: DC | PRN
  Administered 2012-04-28 (×2): 1 mg via INTRAVENOUS
  Filled 2012-04-28 (×2): qty 1

## 2012-04-28 MED ORDER — ACETAMINOPHEN 10 MG/ML IV SOLN
1000.0000 mg | Freq: Four times a day (QID) | INTRAVENOUS | Status: DC
  Administered 2012-04-28: 1000 mg via INTRAVENOUS

## 2012-04-28 MED ORDER — ENOXAPARIN SODIUM 30 MG/0.3ML ~~LOC~~ SOLN
30.0000 mg | Freq: Two times a day (BID) | SUBCUTANEOUS | Status: DC
  Administered 2012-04-29 – 2012-04-30 (×3): 30 mg via SUBCUTANEOUS
  Filled 2012-04-28 (×5): qty 0.3

## 2012-04-28 MED ORDER — GABAPENTIN 300 MG PO CAPS
300.0000 mg | ORAL_CAPSULE | Freq: Every day | ORAL | Status: DC
  Administered 2012-04-28 – 2012-04-29 (×2): 300 mg via ORAL
  Filled 2012-04-28 (×4): qty 1

## 2012-04-28 MED ORDER — SODIUM CHLORIDE 0.9 % IR SOLN
Status: DC | PRN
  Administered 2012-04-28: 3000 mL

## 2012-04-28 MED ORDER — FENTANYL CITRATE 0.05 MG/ML IJ SOLN
INTRAMUSCULAR | Status: DC | PRN
  Administered 2012-04-28: 25 ug via INTRAVENOUS
  Administered 2012-04-28 (×3): 50 ug via INTRAVENOUS
  Administered 2012-04-28: 25 ug via INTRAVENOUS
  Administered 2012-04-28: 50 ug via INTRAVENOUS

## 2012-04-28 MED ORDER — MIDAZOLAM HCL 2 MG/2ML IJ SOLN
INTRAMUSCULAR | Status: AC
  Filled 2012-04-28: qty 2

## 2012-04-28 MED ORDER — METHOCARBAMOL 100 MG/ML IJ SOLN
500.0000 mg | INTRAVENOUS | Status: AC
  Administered 2012-04-28: 500 mg via INTRAVENOUS
  Filled 2012-04-28: qty 5

## 2012-04-28 MED ORDER — BUPIVACAINE ON-Q PAIN PUMP (FOR ORDER SET NO CHG)
INJECTION | Status: DC
  Filled 2012-04-28: qty 1

## 2012-04-28 MED ORDER — MENTHOL 3 MG MT LOZG: 1.0000 | LOZENGE | OROMUCOSAL | Status: DC | PRN

## 2012-04-28 MED ORDER — HYDROMORPHONE HCL PF 1 MG/ML IJ SOLN
0.2500 mg | INTRAMUSCULAR | Status: DC | PRN
  Administered 2012-04-28 (×3): 0.5 mg via INTRAVENOUS

## 2012-04-28 MED ORDER — METHOCARBAMOL 500 MG PO TABS
500.0000 mg | ORAL_TABLET | Freq: Four times a day (QID) | ORAL | Status: DC | PRN
  Administered 2012-04-28 – 2012-04-30 (×3): 500 mg via ORAL
  Filled 2012-04-28 (×3): qty 1

## 2012-04-28 MED ORDER — ONDANSETRON HCL 4 MG/2ML IJ SOLN: 4.0000 mg | Freq: Once | INTRAMUSCULAR | Status: DC | PRN

## 2012-04-28 MED ORDER — 0.9 % SODIUM CHLORIDE (POUR BTL) OPTIME
TOPICAL | Status: DC | PRN
  Administered 2012-04-28: 1000 mL

## 2012-04-28 MED ORDER — LEVOTHYROXINE SODIUM 100 MCG PO TABS
100.0000 ug | ORAL_TABLET | Freq: Every day | ORAL | Status: DC
  Administered 2012-04-29 – 2012-04-30 (×2): 100 ug via ORAL
  Filled 2012-04-28 (×3): qty 1

## 2012-04-28 MED ORDER — MIDAZOLAM HCL 2 MG/2ML IJ SOLN
1.0000 mg | INTRAMUSCULAR | Status: DC | PRN
  Administered 2012-04-28: 2 mg via INTRAVENOUS

## 2012-04-28 MED ORDER — METOCLOPRAMIDE HCL 10 MG PO TABS: 5.0000 mg | ORAL_TABLET | Freq: Three times a day (TID) | ORAL | Status: DC | PRN

## 2012-04-28 MED ORDER — BUPIVACAINE-EPINEPHRINE PF 0.5-1:200000 % IJ SOLN
INTRAMUSCULAR | Status: DC | PRN
  Administered 2012-04-28: 30 mL

## 2012-04-28 MED ORDER — SENNOSIDES-DOCUSATE SODIUM 8.6-50 MG PO TABS
1.0000 | ORAL_TABLET | Freq: Every evening | ORAL | Status: DC | PRN
  Administered 2012-04-29: 1 via ORAL
  Filled 2012-04-28: qty 1

## 2012-04-28 MED ORDER — BUPIVACAINE 0.25 % ON-Q PUMP SINGLE CATH 300ML
INJECTION | Status: DC | PRN
  Administered 2012-04-28: 300 mL

## 2012-04-28 MED ORDER — BUPIVACAINE 0.25 % ON-Q PUMP SINGLE CATH 300ML
300.0000 mL | INJECTION | Status: DC
  Filled 2012-04-28: qty 300

## 2012-04-28 MED ORDER — FENTANYL CITRATE 0.05 MG/ML IJ SOLN
50.0000 ug | INTRAMUSCULAR | Status: DC | PRN
  Administered 2012-04-28 (×2): 50 ug via INTRAVENOUS

## 2012-04-28 MED ORDER — OXYCODONE HCL 15 MG PO TB12
15.0000 mg | ORAL_TABLET | Freq: Two times a day (BID) | ORAL | Status: DC
  Administered 2012-04-28 – 2012-04-30 (×4): 15 mg via ORAL
  Filled 2012-04-28 (×3): qty 1

## 2012-04-28 MED ORDER — BUPIVACAINE-EPINEPHRINE PF 0.25-1:200000 % IJ SOLN
INTRAMUSCULAR | Status: AC
  Filled 2012-04-28: qty 30

## 2012-04-28 MED ORDER — CELECOXIB 200 MG PO CAPS
200.0000 mg | ORAL_CAPSULE | Freq: Two times a day (BID) | ORAL | Status: DC
  Administered 2012-04-28 – 2012-04-30 (×4): 200 mg via ORAL
  Filled 2012-04-28 (×5): qty 1

## 2012-04-28 MED ORDER — LACTATED RINGERS IV SOLN
INTRAVENOUS | Status: DC | PRN
  Administered 2012-04-28 (×2): via INTRAVENOUS

## 2012-04-28 MED ORDER — GLYCOPYRROLATE 0.2 MG/ML IJ SOLN
INTRAMUSCULAR | Status: DC | PRN
  Administered 2012-04-28: 0.2 mg via INTRAVENOUS

## 2012-04-28 MED ORDER — FLEET ENEMA 7-19 GM/118ML RE ENEM: 1.0000 | ENEMA | Freq: Once | RECTAL | Status: AC | PRN

## 2012-04-28 MED ORDER — ALUM & MAG HYDROXIDE-SIMETH 200-200-20 MG/5ML PO SUSP: 30.0000 mL | ORAL | Status: DC | PRN

## 2012-04-28 MED ORDER — EPHEDRINE SULFATE 50 MG/ML IJ SOLN
INTRAMUSCULAR | Status: DC | PRN
  Administered 2012-04-28 (×2): 5 mg via INTRAVENOUS
  Administered 2012-04-28: 10 mg via INTRAVENOUS

## 2012-04-28 MED ORDER — ONDANSETRON HCL 4 MG/2ML IJ SOLN: 4.0000 mg | Freq: Four times a day (QID) | INTRAMUSCULAR | Status: DC | PRN

## 2012-04-28 MED ORDER — BISACODYL 5 MG PO TBEC: 5.0000 mg | DELAYED_RELEASE_TABLET | Freq: Every day | ORAL | Status: DC | PRN

## 2012-04-28 MED ORDER — TRIAMTERENE-HCTZ 37.5-25 MG PO TABS
0.5000 | ORAL_TABLET | Freq: Every day | ORAL | Status: DC
  Administered 2012-04-30: 0.5 via ORAL
  Filled 2012-04-28 (×2): qty 0.5

## 2012-04-28 MED ORDER — DOCUSATE SODIUM 100 MG PO CAPS
100.0000 mg | ORAL_CAPSULE | Freq: Two times a day (BID) | ORAL | Status: DC
  Administered 2012-04-28 – 2012-04-30 (×4): 100 mg via ORAL
  Filled 2012-04-28 (×5): qty 1

## 2012-04-28 SURGICAL SUPPLY — 77 items
ARTICULAR SURFACE SZ EF 14 (Orthopedic Implant) ×2 IMPLANT
ARTISURF SZ EF 14 (Orthopedic Implant) ×1 IMPLANT
BANDAGE ESMARK 6X9 LF (GAUZE/BANDAGES/DRESSINGS) ×1 IMPLANT
BLADE SAGITTAL 13X1.27X60 (BLADE) ×2 IMPLANT
BLADE SAW RECIP 87.9 MT (BLADE) ×2 IMPLANT
BLADE SAW SGTL 13X75X1.27 (BLADE) ×2 IMPLANT
BLADE SAW SGTL 83.5X18.5 (BLADE) ×2 IMPLANT
BLADE SAW SGTL NAR THIN XSHT (BLADE) ×2 IMPLANT
BNDG ESMARK 6X9 LF (GAUZE/BANDAGES/DRESSINGS) ×2
BONE CEMENT PALACOS R-G (Orthopedic Implant) ×4 IMPLANT
BOWL SMART MIX CTS (DISPOSABLE) ×2 IMPLANT
CATH KIT ON Q 5IN SLV (PAIN MANAGEMENT) ×2 IMPLANT
CEMENT BONE PALACOS R-G (Orthopedic Implant) ×2 IMPLANT
CLOTH BEACON ORANGE TIMEOUT ST (SAFETY) ×2 IMPLANT
CLSR STERI-STRIP ANTIMIC 1/2X4 (GAUZE/BANDAGES/DRESSINGS) ×2 IMPLANT
COVER SURGICAL LIGHT HANDLE (MISCELLANEOUS) ×2 IMPLANT
CUFF TOURNIQUET SINGLE 34IN LL (TOURNIQUET CUFF) ×2 IMPLANT
DRAPE EXTREMITY T 121X128X90 (DRAPE) ×2 IMPLANT
DRAPE INCISE IOBAN 66X45 STRL (DRAPES) ×4 IMPLANT
DRAPE PROXIMA HALF (DRAPES) ×2 IMPLANT
DRAPE U-SHAPE 47X51 STRL (DRAPES) ×2 IMPLANT
DRSG ADAPTIC 3X8 NADH LF (GAUZE/BANDAGES/DRESSINGS) ×2 IMPLANT
DRSG PAD ABDOMINAL 8X10 ST (GAUZE/BANDAGES/DRESSINGS) ×4 IMPLANT
DURAPREP 26ML APPLICATOR (WOUND CARE) ×2 IMPLANT
ELECT REM PT RETURN 9FT ADLT (ELECTROSURGICAL) ×2
ELECTRODE REM PT RTRN 9FT ADLT (ELECTROSURGICAL) ×1 IMPLANT
EVACUATOR 1/8 PVC DRAIN (DRAIN) ×2 IMPLANT
FACESHIELD LNG OPTICON STERILE (SAFETY) IMPLANT
FEMORAL COMP ZIMMER (Orthopedic Implant) ×2 IMPLANT
GLOVE BIOGEL M 7.0 STRL (GLOVE) IMPLANT
GLOVE BIOGEL PI IND STRL 7.0 (GLOVE) ×1 IMPLANT
GLOVE BIOGEL PI IND STRL 7.5 (GLOVE) IMPLANT
GLOVE BIOGEL PI IND STRL 8.5 (GLOVE) ×2 IMPLANT
GLOVE BIOGEL PI INDICATOR 7.0 (GLOVE) ×1
GLOVE BIOGEL PI INDICATOR 7.5 (GLOVE)
GLOVE BIOGEL PI INDICATOR 8.5 (GLOVE) ×2
GLOVE SURG ORTHO 8.0 STRL STRW (GLOVE) ×4 IMPLANT
GLOVE SURG SS PI 7.0 STRL IVOR (GLOVE) ×2 IMPLANT
GOWN PREVENTION PLUS XLARGE (GOWN DISPOSABLE) ×4 IMPLANT
GOWN STRL NON-REIN LRG LVL3 (GOWN DISPOSABLE) ×4 IMPLANT
HANDPIECE INTERPULSE COAX TIP (DISPOSABLE) ×1
HOOD PEEL AWAY FACE SHEILD DIS (HOOD) ×8 IMPLANT
KIT BASIN OR (CUSTOM PROCEDURE TRAY) ×2 IMPLANT
KIT ROOM TURNOVER OR (KITS) ×2 IMPLANT
MANIFOLD NEPTUNE II (INSTRUMENTS) ×2 IMPLANT
NEEDLE 18GX1X1/2 (RX/OR ONLY) (NEEDLE) IMPLANT
NEEDLE 22X1 1/2 (OR ONLY) (NEEDLE) ×2 IMPLANT
NS IRRIG 1000ML POUR BTL (IV SOLUTION) ×2 IMPLANT
PACK TOTAL JOINT (CUSTOM PROCEDURE TRAY) ×2 IMPLANT
PAD ARMBOARD 7.5X6 YLW CONV (MISCELLANEOUS) ×4 IMPLANT
PADDING CAST COTTON 6X4 STRL (CAST SUPPLIES) ×2 IMPLANT
PATELLA ZIMMER 32MM (Orthopedic Implant) ×2 IMPLANT
PLATE TIB 4 66X46NT ZIER (Knees) ×1 IMPLANT
POSITIONER HEAD PRONE TRACH (MISCELLANEOUS) ×2 IMPLANT
SET HNDPC FAN SPRY TIP SCT (DISPOSABLE) ×1 IMPLANT
SPONGE GAUZE 4X4 12PLY (GAUZE/BANDAGES/DRESSINGS) ×2 IMPLANT
STAPLER VISISTAT 35W (STAPLE) ×2 IMPLANT
STEM OFF EXT ZIMMER (Stem) ×2 IMPLANT
STEM ST EXT ZIER 100X145X17X (Stem) ×1 IMPLANT
STEM ST EXT ZIMMER (Stem) ×1 IMPLANT
SUCTION FRAZIER TIP 10 FR DISP (SUCTIONS) ×2 IMPLANT
SUT BONE WAX W31G (SUTURE) ×2 IMPLANT
SUT PDS AB 0 CT 36 (SUTURE) ×4 IMPLANT
SUT PDS AB 1 CT  36 (SUTURE) ×3
SUT PDS AB 1 CT 36 (SUTURE) ×3 IMPLANT
SUT PDS AB 2-0 CT1 27 (SUTURE) ×4 IMPLANT
SUT VIC AB 0 CTB1 27 (SUTURE) IMPLANT
SUT VIC AB 1 CT1 27 (SUTURE)
SUT VIC AB 1 CT1 27XBRD ANBCTR (SUTURE) IMPLANT
SUT VIC AB 2-0 CTB1 (SUTURE) ×4 IMPLANT
SYR 20ML ECCENTRIC (SYRINGE) ×2 IMPLANT
TIBIA ZIMMER (Knees) ×1 IMPLANT
TOWEL OR 17X24 6PK STRL BLUE (TOWEL DISPOSABLE) ×2 IMPLANT
TOWEL OR 17X26 10 PK STRL BLUE (TOWEL DISPOSABLE) ×2 IMPLANT
TRAY FOLEY CATH 14FR (SET/KITS/TRAYS/PACK) ×2 IMPLANT
TUBE ANAEROBIC SPECIMEN COL (MISCELLANEOUS) IMPLANT
WATER STERILE IRR 1000ML POUR (IV SOLUTION) ×4 IMPLANT

## 2012-04-28 NOTE — H&P (Signed)
[name] MRN:  [id number] DOB/SEX:  12-03-1924/female  CHIEF COMPLAINT:  Painful right Knee  HISTORY: Patient is a 78 y.o. female presented with a history of pain in the right knee. Onset of symptoms was gradual starting several months ago with gradually worsening course since that time. The patient noted no past surgery on the right knee. Prior procedures on the knee include arthroplasty. Patient has been treated conservatively with over-the-counter NSAIDs and activity modification. Patient currently rates pain in the knee at 10 out of 10 with activity. There is pain at night.  PAST MEDICAL HISTORY: Patient Active Problem List   Diagnosis Date Noted  . Anemia of unknown etiology 01/21/2012  . Hypokalemia 01/19/2012  . Anemia due to blood loss, acute 01/19/2012  . Infection of prosthetic knee joint 01/18/2012  . Drug rash 12/14/2011  . Prosthetic joint infection 12/14/2011  . GBS (group B streptococcus) infection 12/14/2011  . Hx of thyroid cancer 12/14/2011  . Bilateral lower extremity edema 12/14/2011  . Hypertension 01/05/2011   Past Medical History  Diagnosis Date  . Angina   . Headache   . Hypoglycemia   . Fracture 2008    back  . Macular degeneration of both eyes   . Glaucoma   . Hypothyroidism   . Blood transfusion   . Anemia   . GERD (gastroesophageal reflux disease)   . H/O hiatal hernia   . Arthritis   . Chronic back pain   . Thyroid cancer 1986  . Shortness of breath 11/08/11    "here lately, all the time"  . Exertional dyspnea 01/17/12  . Osteomyelitis 11/2011    right knee  . Hypertension   . Pneumonia 01/2010   Past Surgical History  Procedure Date  . Thyroidectomy 1986  . Shoulder arthroscopy w/ rotator cuff repair   . Trigger finger release     right  thumb  . Abdominal hysterectomy 1963  . Carpal tunnel release     bilaterally  . Tonsillectomy and adenoidectomy   . Appendectomy   . Dilation and curettage of uterus   . Cataract extraction  w/ intraocular lens  implant, bilateral   . Joint replacement 2005, 2006, 2012, 11/10/11    left; left; right; right; right  . Goiter removed 1980's    "when I was young"  . Fracture surgery     "broke back"  . Hand or 09/2011    "left hand; bones were coming together"  . I&d knee with poly exchange 11/10/2011    Procedure: IRRIGATION AND DEBRIDEMENT KNEE WITH POLY EXCHANGE;  Surgeon: Thera Flake., MD;  Location: [facility] OR;  Service: Orthopedics;  Laterality: Right;  . Post polyethelene exchange 11/09/11    RLE  . Back surgery     "3 back OR's;"; S/P "fractured back 2008"  . Peripherally inserted central catheter insertion 11/2011    "removed 12/2011; LUE"     MEDICATIONS:   No prescriptions prior to admission    ALLERGIES:   Allergies  Allergen Reactions  . Demerol Shortness Of Breath  . Lorazepam Anxiety  . Penicillins Swelling    "in the face"  . Tetracyclines & Related Rash  . Vancomycin Rash    REVIEW OF SYSTEMS:  Pertinent items are noted in HPI.   FAMILY HISTORY:  No family history on file.  SOCIAL HISTORY:   History  Substance Use Topics  . Smoking status: Never Smoker   . Smokeless tobacco: Never Used  . Alcohol Use: No  EXAMINATION:  Vital signs in last 24 hours:    General appearance: no distress Lungs: clear to auscultation bilaterally Heart: regular rate and rhythm, S1, S2 normal, no murmur, click, rub or gallop Abdomen: soft, non-tender; bowel sounds normal; no masses,  no organomegaly Extremities: extremities normal, atraumatic, no cyanosis or edema and Homans sign is negative, no sign of DVT Pulses: 2+ and symmetric Skin: Skin color, texture, turgor normal. No rashes or lesions Neurologic: Alert and oriented X 3, normal strength and tone. Normal symmetric reflexes. Normal coordination and gait  Musculoskeletal:  ROM 0-115 , Ligaments intact,  Imaging Review Plain radiographs demonstrate loosening of the right knee. The overall alignment is  neutral. The bone quality appears to be fair for age and reported activity level.  Assessment/Plan painful right knee   The patient history, physical examination and imaging studies are consistent with loosening of the right knee. The patient has failed conservative treatment.  The clearance notes were reviewed.  After discussion with the patient it was felt that Total Knee Revision was indicated. The procedure,  risks, and benefits of total knee arthroplasty were presented and reviewed. The risks including but not limited to aseptic loosening, infection, blood clots, vascular injury, stiffness, patella tracking problems complications among others were discussed. The patient acknowledged the explanation, agreed to proceed with the plan.  Paiten Boies 04/28/2012, 7:08 AM

## 2012-04-28 NOTE — Anesthesia Procedure Notes (Addendum)
Anesthesia Regional Block:  Femoral nerve block  Pre-Anesthetic Checklist: ,, timeout performed, Correct Patient, Correct Site, Correct Laterality, Correct Procedure, Correct Position, site marked, Risks and benefits discussed,  Surgical consent,  Pre-op evaluation,  At surgeon's request and post-op pain management  Laterality: Left  Prep: chloraprep       Needles:  Injection technique: Single-shot  Needle Type: Echogenic Stimulator Needle          Additional Needles:  Procedures: ultrasound guided and nerve stimulator Femoral nerve block  Nerve Stimulator or Paresthesia:  Response: 0.4 mA,   Additional Responses:   Narrative:  Start time: 04/28/2012 10:00 AM End time: 04/28/2012 10:15 AM Injection made incrementally with aspirations every 5 mL. Anesthesiologist: Arta Bruce MD  Additional Notes: Monitors applied. Patient sedated. Sterile prep and drape,hand hygiene and sterile gloves were used. Relevant anatomy identified.Needle position confirmed.Local anesthetic injected incrementally after negative aspiration. Local anesthetic spread visualized around nerve(s). Vascular puncture avoided. No complications. Image printed for medical record.The patient tolerated the procedure well.       Femoral nerve block Procedure Name: LMA Insertion Date/Time: 04/28/2012 10:37 AM Performed by: Romie Minus K Pre-anesthesia Checklist: Patient identified, Emergency Drugs available, Suction available, Patient being monitored and Timeout performed Patient Re-evaluated:Patient Re-evaluated prior to inductionOxygen Delivery Method: Circle system utilized Preoxygenation: Pre-oxygenation with 100% oxygen Intubation Type: IV induction Ventilation: Mask ventilation without difficulty LMA: LMA inserted LMA Size: 4.0 Number of attempts: 1 Placement Confirmation: positive ETCO2 and breath sounds checked- equal and bilateral Tube secured with: Tape Dental Injury: Teeth and Oropharynx as  per pre-operative assessment

## 2012-04-28 NOTE — Progress Notes (Signed)
Orthopedic Tech Progress Note Patient Details:  SURENA WELGE 1925-03-23 [id number]  Patient ID: Illene Bolus, female   DOB: April 12, 1925, 78 y.o.   MRN: [id number] Viewed order from doctor's order list  Nikki Dom 04/28/2012, 2:12 PM

## 2012-04-28 NOTE — Plan of Care (Signed)
Problem: Consults Goal: Diagnosis- Total Joint Replacement Revision Total Knee     

## 2012-04-28 NOTE — Anesthesia Preprocedure Evaluation (Addendum)
Anesthesia Evaluation  Patient identified by MRN, date of birth, ID band Patient awake    Reviewed: Allergy & Precautions, H&P , NPO status , Patient's Chart, lab work & pertinent test results, reviewed documented beta blocker date and time   History of Anesthesia Complications Negative for: history of anesthetic complications  Airway Mallampati: I TM Distance: >3 FB Neck ROM: Full    Dental  (+) Teeth Intact and Dental Advisory Given   Pulmonary shortness of breath and with exertion,  breath sounds clear to auscultation        Cardiovascular hypertension, Pt. on medications Rhythm:Regular Rate:Normal     Neuro/Psych    GI/Hepatic GERD-  Medicated and Controlled,  Endo/Other  Hypothyroidism   Renal/GU      Musculoskeletal   Abdominal   Peds  Hematology   Anesthesia Other Findings   Reproductive/Obstetrics                          Anesthesia Physical Anesthesia Plan  ASA: II  Anesthesia Plan: General   Post-op Pain Management: MAC Combined w/ Regional for Post-op pain   Induction: Intravenous  Airway Management Planned: LMA  Additional Equipment:   Intra-op Plan:   Post-operative Plan: Extubation in OR  Informed Consent: I have reviewed the patients History and Physical, chart, labs and discussed the procedure including the risks, benefits and alternatives for the proposed anesthesia with the patient or authorized representative who has indicated his/her understanding and acceptance.     Plan Discussed with: CRNA and Surgeon  Anesthesia Plan Comments:         Anesthesia Quick Evaluation

## 2012-04-28 NOTE — Plan of Care (Signed)
Problem: Consults Goal: Diagnosis- Total Joint Replacement Revision Total Knee right        

## 2012-04-28 NOTE — Progress Notes (Signed)
Orthopedic Tech Progress Note Patient Details:  [name] 05-Feb-1925 [id number]  CPM Right Knee CPM Right Knee: On Right Knee Flexion (Degrees): 60  Right Knee Extension (Degrees): 0  Additional Comments: applied trapeze bar patient helper   Nikki Dom 04/28/2012, 2:12 PM

## 2012-04-28 NOTE — Preoperative (Signed)
Beta Blockers   Reason not to administer Beta Blockers:Not Applicable 

## 2012-04-28 NOTE — Anesthesia Postprocedure Evaluation (Signed)
Anesthesia Post Note  Patient: [name]  Procedure(s) Performed: Procedure(s) (LRB): TOTAL KNEE REVISION (Right)  Anesthesia type: general  Patient location: PACU  Post pain: Pain level controlled  Post assessment: Patient's Cardiovascular Status Stable  Last Vitals:  Filed Vitals:   04/28/12 1345  BP: 102/68  Pulse: 65  Temp:   Resp: 14    Post vital signs: Reviewed and stable  Level of consciousness: sedated  Complications: No apparent anesthesia complications

## 2012-04-28 NOTE — Transfer of Care (Signed)
Immediate Anesthesia Transfer of Care Note  Patient: [name]  Procedure(s) Performed: Procedure(s) (LRB): TOTAL KNEE REVISION (Right)  Patient Location: PACU  Anesthesia Type: General  Level of Consciousness: awake, alert  and oriented  Airway & Oxygen Therapy: Patient Spontanous Breathing and Patient connected to nasal cannula oxygen  Post-op Assessment: Report given to PACU RN and Post -op Vital signs reviewed and stable  Post vital signs: Reviewed  Complications: No apparent anesthesia complications

## 2012-04-29 ENCOUNTER — Encounter (HOSPITAL_COMMUNITY): Payer: Self-pay | Admitting: Orthopedic Surgery

## 2012-04-29 LAB — BASIC METABOLIC PANEL
CO2: 29 mEq/L (ref 19–32)
Chloride: 98 mEq/L (ref 96–112)
GFR calc Af Amer: 55 mL/min — ABNORMAL LOW (ref >90)
Sodium: 134 mEq/L — ABNORMAL LOW (ref 135–145)

## 2012-04-29 LAB — CBC
HCT: 22.7 % — ABNORMAL LOW (ref 36.0–46.0)
MCV: 97.4 fL (ref 78.0–100.0)
Platelets: 193 10*3/uL (ref 150–400)
Platelets: 197 10*3/uL (ref 150–400)
RBC: 2.59 MIL/uL — ABNORMAL LOW (ref 3.87–5.11)
RBC: 2.33 MIL/uL — ABNORMAL LOW (ref 3.87–5.11)
RDW: 15.2 % (ref 11.5–15.5)
RDW: 15.4 % (ref 11.5–15.5)
WBC: 5.6 10*3/uL (ref 4.0–10.5)
WBC: 5.6 10*3/uL (ref 4.0–10.5)

## 2012-04-29 LAB — URINE CULTURE

## 2012-04-29 MED ORDER — OXYCODONE HCL 5 MG PO TABS: 5.0000 mg | ORAL_TABLET | ORAL | Status: AC | PRN

## 2012-04-29 MED ORDER — OXYCODONE HCL 15 MG PO TB12: 15.0000 mg | ORAL_TABLET | Freq: Two times a day (BID) | ORAL | Status: DC

## 2012-04-29 MED ORDER — ENOXAPARIN SODIUM 40 MG/0.4ML ~~LOC~~ SOLN: 40.0000 mg | Freq: Every day | SUBCUTANEOUS | Status: DC

## 2012-04-29 MED ORDER — METHOCARBAMOL 500 MG PO TABS: 500.0000 mg | ORAL_TABLET | Freq: Four times a day (QID) | ORAL | Status: AC | PRN

## 2012-04-29 MED ORDER — CELECOXIB 200 MG PO CAPS: 200.0000 mg | ORAL_CAPSULE | Freq: Two times a day (BID) | ORAL | Status: AC

## 2012-04-29 NOTE — Evaluation (Signed)
Physical Therapy Evaluation Patient Details Name: [name] MRN: [id number] DOB: 1925/02/21 Today's Date: 04/29/2012 Time: 1050-1120 PT Time Calculation (min): 30 min  PT Assessment / Plan / Recommendation Clinical Impression  Pt presents s/p R TKA revision also with history of infection in joint (causing several knee surgeries) and acute anemia.  Tolerated OOB to chair with some c/o dizziness/lightheadedness and low BP.  BPs noted in mobility section below.  Pt will benefit from skilled PT in acute venue to address deficits.  PT recommends HHPT for follow up therapy at D/C to return pt to prior level of functioning.     PT Assessment  Patient needs continued PT services    Follow Up Recommendations  Home health PT    Barriers to Discharge None      Equipment Recommendations  None recommended by PT    Recommendations for Other Services OT consult   Frequency 7X/week    Precautions / Restrictions Precautions Precautions: Knee Restrictions Weight Bearing Restrictions: No RLE Weight Bearing: Weight bearing as tolerated   Pertinent Vitals/Pain 7/10      Mobility  Bed Mobility Bed Mobility: Supine to Sit;Sitting - Scoot to Edge of Bed Supine to Sit: 4: Min guard;HOB elevated Sitting - Scoot to Edge of Bed: 5: Supervision Details for Bed Mobility Assistance: Min/guard to ensure safety of RLE out of bed.  Pt does well with hand placement, however did become dizzy upon sitting up.  BP in supine was 89/40, sitting 108/49 with improvement in symptoms as she sat longer.   Transfers Transfers: Sit to Stand;Stand to [employer] Transfers Sit to Stand: 4: Min assist;With upper extremity assist;From bed Stand to Sit: 4: Min assist;With upper extremity assist;With armrests;To chair/3-in-1 Stand Pivot Transfers: 1: +2 Total assist Stand Pivot Transfers: Patient Percentage: 70% Details for Transfer Assistance: Assist for steadying once in standing with cues for hand placement  and safety due to pt impulsive to stand without assist in place.  Took some steps from bed to chair with RW and cues for sequencing/technique and safety.  Ambulation/Gait Ambulation/Gait Assistance: 4: Min assist Ambulation Distance (Feet): 4 Feet Assistive device: Rolling walker Ambulation/Gait Assistance Details: see above for transfer cues Stairs: No Wheelchair Mobility Wheelchair Mobility: No    Exercises     PT Diagnosis: Difficulty walking;Generalized weakness;Acute pain  PT Problem List: Decreased strength;Decreased range of motion;Decreased activity tolerance;Decreased balance;Decreased mobility;Decreased knowledge of use of DME;Decreased safety awareness;Pain PT Treatment Interventions: DME instruction;Gait training;Stair training;Functional mobility training;Therapeutic activities;Therapeutic exercise;Balance training;Patient/family education   PT Goals Acute Rehab PT Goals PT Goal Formulation: With patient Time For Goal Achievement: 05/03/12 Potential to Achieve Goals: Good Pt will go Supine/Side to Sit: with supervision PT Goal: Supine/Side to Sit - Progress: Goal set today Pt will go Sit to Supine/Side: with supervision PT Goal: Sit to Supine/Side - Progress: Goal set today Pt will go Sit to Stand: with supervision PT Goal: Sit to Stand - Progress: Goal set today Pt will Ambulate: 51 - 150 feet;with supervision;with least restrictive assistive device PT Goal: Ambulate - Progress: Goal set today Pt will Go Up / Down Stairs: 1-2 stairs;with supervision;with least restrictive assistive device PT Goal: Up/Down Stairs - Progress: Goal set today Pt will Perform Home Exercise Program: with supervision, verbal cues required/provided PT Goal: Perform Home Exercise Program - Progress: Goal set today  Visit Information  Last PT Received On: 04/29/12 Assistance Needed: +2 (for low BP)    Subjective Data  Subjective: They said to let you  know if I get dizzy Patient Stated  Goal: to return home with husband.    Prior Functioning  Home Living Lives With: Spouse Available Help at Discharge: Family Type of Home: House Home Access: Stairs to enter [employer] of Steps: 2 Entrance Stairs-Rails: Right Home Layout: Two level;Able to live on main level with bedroom/bathroom Bathroom Shower/Tub: Walk-in shower Home Adaptive Equipment: Shower chair with back;Bedside commode/3-in-1;Walker - rolling Prior Function Level of Independence: Needs assistance Needs Assistance: Meal Prep;Light Housekeeping Meal Prep: Moderate Light Housekeeping: Moderate Able to Take Stairs?: Yes Driving: No Vocation: Retired Musician: No difficulties    Cognition  Overall Cognitive Status: Appears within functional limits for tasks assessed/performed Arousal/Alertness: Awake/alert Orientation Level: Appears intact for tasks assessed Behavior During Session: [facility] for tasks performed    Extremity/Trunk Assessment Right Lower Extremity Assessment RLE ROM/Strength/Tone: Deficits RLE ROM/Strength/Tone Deficits: ankle motions WFL, able to perform SLR with little assist, limited knee flex approx 30 deg, possibly due to bandage RLE Sensation: WFL - Light Touch Left Lower Extremity Assessment LLE ROM/Strength/Tone: WFL for tasks assessed LLE Sensation: WFL - Light Touch Trunk Assessment Trunk Assessment: Kyphotic   Balance    End of Session PT - End of Session Equipment Utilized During Treatment: Gait belt Activity Tolerance: Patient tolerated treatment well;Other (comment) (Limited by some lightheadedness/dizziness) Patient left: in chair;with call bell/phone within reach Nurse Communication: Other (comment) (notified RN of BP) CPM Right Knee CPM Right Knee: Off  GP     Page, Meribeth Mattes 04/29/2012, 11:40 AM

## 2012-04-29 NOTE — Progress Notes (Signed)
CARE MANAGEMENT NOTE 04/29/2012  Patient:  [name], [name]   Account Number:  [id number]  Date Initiated:  04/29/2012  Documentation initiated by:  Vance Peper  Subjective/Objective Assessment:   78 yr old female s/p revision of right total knee arthroplasty     Action/Plan:   Patient preoperatively setup with [facility], will need shortterm SNF. Social Worker aware.   Anticipated DC Date:  05/01/2012   Anticipated DC Plan:  SKILLED NURSING FACILITY  In-house referral  Clinical Social Worker      DC Planning Services  CM consult      Choice offered to / List presented to:             Status of service:  Completed, signed off Medicare Important Message given?   (If response is "NO", the following Medicare IM given date fields will be blank) Date Medicare IM given:   Date Additional Medicare IM given:    Discharge Disposition:  SKILLED NURSING FACILITY  Per UR Regulation:    If discussed at Long Length of Stay Meetings, dates discussed:    Comments:

## 2012-04-29 NOTE — Discharge Summary (Signed)
[name], MD   Altamese Cabal, PA-C [address] [place], [place], [place]  [address]                             [phone]  PATIENT ID: [name]        MRN:  [id number]          DOB/AGE: Aug 10, 1925 / 78 y.o.    DISCHARGE SUMMARY  ADMISSION DATE:    04/28/2012 DISCHARGE DATE:   05/28/12   ADMISSION DIAGNOSIS: infection right total knee    DISCHARGE DIAGNOSIS:  Painful Right Knee    ADDITIONAL DIAGNOSIS: Active Problems:  * No active hospital problems. *   Past Medical History  Diagnosis Date  . Angina   . Headache   . Hypoglycemia   . Fracture 2008    back  . Macular degeneration of both eyes   . Glaucoma   . Hypothyroidism   . Blood transfusion   . Anemia   . GERD (gastroesophageal reflux disease)   . H/O hiatal hernia   . Arthritis   . Chronic back pain   . Thyroid cancer 1986  . Shortness of breath 11/08/11    "here lately, all the time"  . Exertional dyspnea 01/17/12  . Osteomyelitis 11/2011    right knee  . Hypertension   . Pneumonia 01/2010    PROCEDURE: Procedure(s): TOTAL KNEE REVISION on 04/28/2012  CONSULTS:     HISTORY:  See H&P in chart  HOSPITAL COURSE:  JOYEL CHENETTE is a 78 y.o. admitted on 04/28/2012 and found to have a diagnosis of Painful Right Knee.  After appropriate laboratory studies were obtained  they were taken to the operating room on 04/28/2012 and underwent Procedure(s): TOTAL KNEE REVISION.   They were given perioperative antibiotics:  Anti-infectives     Start     Dose/Rate Route Frequency Ordered Stop   04/27/12 1152   clindamycin (CLEOCIN) IVPB 900 mg        900 mg 100 mL/hr over 30 Minutes Intravenous 60 min pre-op 04/27/12 1152 04/28/12 1029        .  Tolerated the procedure well.  Placed with a foley intraoperatively.  Given Ofirmev at induction and for 48 hours.    POD #1, allowed out of bed to a chair.  PT for ambulation and exercise program.  Foley D/C'd in morning.  IV saline locked.  O2  discontionued.  POD #2, continued PT and ambulation.   Hemovac pulled. .  The remainder of the hospital course was dedicated to ambulation and strengthening.   The patient was discharged on 2 day post op in  Good condition.  Blood products given:2 units CC PRBC  DIAGNOSTIC STUDIES: Recent vital signs: Patient Vitals for the past 24 hrs:  BP Temp Pulse Resp SpO2  2012-05-28 0500 92/37 mmHg 98 F (36.7 C) 65  18  100 %  04/28/12 2127 102/38 mmHg 97.5 F (36.4 C) 74  18  100 %       Recent laboratory studies:  Basename 05/28/12 1335 May 28, 2012 0606 04/23/12 1357  WBC 5.6 5.6 4.5  HGB 7.6* 8.3* 13.0  HCT 22.7* 24.9* 39.1  PLT 197 193 286    Basename 05/28/2012 0606 04/23/12 1357  NA 134* 139  K 3.7 3.9  CL 98 98  CO2 29 34*  BUN 16 12  CREATININE 1.05 0.83  GLUCOSE 121* 101*  CALCIUM 7.9* 9.5  Lab Results  Component Value Date   INR 1.00 04/23/2012   INR 1.05 01/17/2012   INR 1.24 11/08/2011     Recent Radiographic Studies :  X-ray Knee Right Port  04/28/2012  *RADIOLOGY REPORT*  Clinical Data: 78 year old female status post right knee arthroplasty.  PORTABLE RIGHT KNEE - 1-2 VIEW  Comparison: 03/30/2011.  Findings: Revision of right total knee arthroplasty seen in 2012. New femoral and tibial components.  The tibial stem component has a mild angulation from the metadiaphyseal region to the diaphysis, presumably by design to prevent loosening.  Hardware appears intact and alignment within normal limits.  Postoperative drain in place.  Postoperative changes to the soft tissues.  IMPRESSION: Interval revision right total knee arthroplasty. Mild angulation of the tibial stem component presumably is by design. No adverse features identified.   Original Report Authenticated By: Harley Hallmark, M.D.     DISCHARGE INSTRUCTIONS: Discharge Orders    Future Orders Please Complete By Expires   Diet - low sodium heart healthy      Call MD / Call 911      Comments:   If you experience  chest pain or shortness of breath, CALL 911 and be transported to the hospital emergency room.  If you develope a fever above 101 F, pus (white drainage) or increased drainage or redness at the wound, or calf pain, call your surgeon's office.   Constipation Prevention      Comments:   Drink plenty of fluids.  Prune juice may be helpful.  You may use a stool softener, such as Colace (over the counter) 100 mg twice a day.  Use MiraLax (over the counter) for constipation as needed.   Increase activity slowly as tolerated      Driving restrictions      Comments:   No driving for 6 weeks   Lifting restrictions      Comments:   No lifting for 6 weeks   CPM      Comments:   Continuous passive motion machine (CPM):      Use the CPM from 0 to 60 for 6-8 hours per day.      You may increase by 10 per day.  You may break it up into 2 or 3 sessions per day.      Use CPM for 2 weeks or until you are told to stop.   TED hose      Comments:   Use stockings (TED hose) for 3 weeks on both leg(s).  You may remove them at night for sleeping.   Change dressing      Comments:   Change dressing on thursday, then change the dressing daily with sterile 4 x 4 inch gauze dressing and apply TED hose.  You may clean the incision with alcohol prior to redressing.   Do not put a pillow under the knee. Place it under the heel.         DISCHARGE MEDICATIONS:   Medication List  As of 04/29/2012  5:23 PM   TAKE these medications         celecoxib 200 MG capsule   Commonly known as: CELEBREX   Take 1 capsule (200 mg total) by mouth every 12 (twelve) hours.      diazepam 5 MG tablet   Commonly known as: VALIUM   Take 1 tablet by mouth At bedtime as needed. For sleep      dorzolamide-timolol 22.3-6.8 MG/ML ophthalmic solution   Commonly known  as: COSOPT   Place 1 drop into both eyes 2 (two) times daily.      enoxaparin 40 MG/0.4ML injection   Commonly known as: LOVENOX   Inject 0.4 mLs (40 mg total) into the  skin daily.      gabapentin 300 MG capsule   Commonly known as: NEURONTIN   Take 300 mg by mouth at bedtime.      latanoprost 0.005 % ophthalmic solution   Commonly known as: XALATAN   Place 1 drop into both eyes at bedtime.      levothyroxine 100 MCG tablet   Commonly known as: SYNTHROID, LEVOTHROID   Take 100 mcg by mouth daily.      methocarbamol 500 MG tablet   Commonly known as: ROBAXIN   Take 1 tablet (500 mg total) by mouth every 6 (six) hours as needed.      oxyCODONE 5 MG immediate release tablet   Commonly known as: Oxy IR/ROXICODONE   Take 1-2 tablets (5-10 mg total) by mouth every 4 (four) hours as needed.      oxyCODONE 15 MG Tb12   Commonly known as: OXYCONTIN   Take 1 tablet (15 mg total) by mouth every 12 (twelve) hours.      propranolol 20 MG tablet   Commonly known as: INDERAL   Take 20 mg by mouth daily. Takes for migraines.      triamterene-hydrochlorothiazide 37.5-25 MG per tablet   Commonly known as: MAXZIDE-25   Take 0.5 tablets by mouth daily.            FOLLOW UP VISIT:   Follow-up Information    Follow up with LUCEY,STEPHEN D, MD. Schedule an appointment as soon as possible for a visit on 05/13/2012.   Contact information:   [address] [place] [address] [phone]          DISPOSITION:  Home    CONDITION:  Good   Nainika Newlun 04/29/2012, 5:23 PM

## 2012-04-29 NOTE — Progress Notes (Signed)
Clinical Social Work Department BRIEF PSYCHOSOCIAL ASSESSMENT 04/29/2012  Patient:  [name], [name]     Account Number:  [id number]     Admit date:  04/28/2012  Clinical Social Worker:  Dennison Bulla  Date/Time:  04/29/2012 03:10 PM  Referred by:  Physician  Date Referred:  04/29/2012 Referred for  SNF Placement   Other Referral:   Interview type:  Patient Other interview type:   Husband present    PSYCHOSOCIAL DATA Living Status:  FAMILY Admitted from facility:   Level of care:   Primary support name:  Molly Maduro Primary support relationship to patient:  SPOUSE Degree of support available:   Strong    CURRENT CONCERNS Current Concerns  Post-Acute Placement   Other Concerns:    SOCIAL WORK ASSESSMENT / PLAN CSW received referral to assist with SNF placement. CSW met with patient at bedside with husband present. Patient agreeable to husband being involved in assessment.    CSW introduced myself and explained role. Patient reports that she was active with [place] prior to admission. Patient reports that husband is supportive and she enjoys therapy with [facility] therapist. CSW spoke with patient regarding option of SNF and if she desired placement. Patient politely refused SNF and reported that she wanted to go home to be with her husband and dog. Husband is agreeable to this plan as well. CSW made CM aware of HH needs.    CSW is signing off but available if needed.   Assessment/plan status:  No Further Intervention Required Other assessment/ plan:   Information/referral to community resources:   [facility] vs SNF info    PATIENT'S/FAMILY'S RESPONSE TO PLAN OF CARE: Patient was alert and oriented. Patient receptive to CSW consult. Patient and husband engaged throughout conversation.

## 2012-04-29 NOTE — Op Note (Signed)
Dictation Number:  [id number]

## 2012-04-29 NOTE — Progress Notes (Signed)
Sheila Spurling, MD   Sheila Cabal, PA-C [address] [place], [place], [place]  [address]                             [phone]   PROGRESS NOTE  Subjective:  negative for Chest Pain  negative for Shortness of Breath  negative for Nausea/Vomiting   negative for Calf Pain  negative for Bowel Movement   Tolerating Diet: yes         Patient reports pain as 3 on 0-10 scale.    Objective: Vital signs in last 24 hours:   Patient Vitals for the past 24 hrs:  BP Temp Temp src Pulse Resp SpO2 Height Weight  04/29/12 0500 92/37 mmHg 98 F (36.7 C) - 65  18  100 % - -  04/28/12 2127 102/38 mmHg 97.5 F (36.4 C) - 74  18  100 % - -  04/28/12 1541 - - - - - - 5\' 3"  (1.6 m) 65.988 kg (145 lb 7.6 oz)  04/28/12 1500 105/52 mmHg 97.6 F (36.4 C) Oral 65  18  99 % - -  04/28/12 1445 100/43 mmHg 97.5 F (36.4 C) - 63  23  99 % - -  04/28/12 1430 100/43 mmHg - - 63  20  99 % - -  04/28/12 1415 100/59 mmHg - - 64  13  100 % - -  04/28/12 1400 108/48 mmHg - - 63  13  99 % - -  04/28/12 1345 102/68 mmHg - - 65  14  99 % - -  04/28/12 1330 103/52 mmHg - - 66  14  98 % - -  04/28/12 1316 116/65 mmHg 97 F (36.1 C) - 68  13  98 % - -  04/28/12 1010 - - - 64  - 95 % - -  04/28/12 0950 - - - 63  - 100 % - -    @flow {1959:LAST@   Intake/Output from previous day:   08/26 0701 - 08/27 0700 In: 2450 [I.V.:2150] Out: 1650 [Urine:1150; Drains:400]   Intake/Output this shift:       Intake/Output      08/26 0701 - 08/27 0700 08/27 0701 - 08/28 0700   I.V. (mL/kg) 2150 (32.6)    Other 200    IV Piggyback 100    Total Intake(mL/kg) 2450 (37.1)    Urine (mL/kg/hr) 1150 (0.7)    Drains 400    Blood 100    Total Output 1650    Net +800            LABORATORY DATA:  Basename 04/29/12 0606 04/23/12 1357  WBC 5.6 4.5  HGB 8.3* 13.0  HCT 24.9* 39.1  PLT 193 286    Basename 04/29/12 0606 04/23/12 1357  NA 134* 139  K 3.7 3.9  CL 98 98  CO2 29 34*  BUN 16 12  CREATININE 1.05 0.83    GLUCOSE 121* 101*  CALCIUM 7.9* 9.5   Lab Results  Component Value Date   INR 1.00 04/23/2012   INR 1.05 01/17/2012   INR 1.24 11/08/2011    Examination:  General appearance: alert, cooperative and no distress Extremities: Homans sign is negative, no sign of DVT  Wound Exam: clean, dry, intact   Drainage:  Scant/small amount Serosanguinous exudate  Motor Exam: EHL and FHL Intact  Sensory Exam: Deep Peroneal normal  Vascular Exam:    Assessment:    1  Day Post-Op  Procedure(s) (LRB): TOTAL KNEE REVISION (Right)  ADDITIONAL DIAGNOSIS:  Active Problems:  * No active hospital problems. *   Acute Blood Loss Anemia   Plan: Physical Therapy as ordered Weight Bearing as Tolerated (WBAT)  DVT Prophylaxis:  Lovenox  DISCHARGE PLAN: Home  DISCHARGE NEEDS: HHPT, CPM, Walker and 3-in-1 comode seat         [name] 04/29/2012, 9:47 AM

## 2012-04-29 NOTE — Progress Notes (Signed)
CARE MANAGEMENT NOTE 04/29/2012  Patient:  [name], [name]   Account Number:  [id number]  Date Initiated:  04/29/2012  Documentation initiated by:  Vance Peper  Subjective/Objective Assessment:   78 yr old female s/p revision of right total knee arthroplasty     Action/Plan:   CM spoke with patient's husband. Patient preoperatively setup with [facility], no changes. Husband will assist with her care, she does not want to go to SNF, wants to go home. CPM has been delivered, they have RW, and 3in1.   Anticipated DC Date:  05/01/2012   Anticipated DC Plan:  HOME W HOME HEALTH SERVICES      DC Planning Services  CM consult      [facility] Choice  HOME HEALTH   Choice offered to / List presented to:  C-3 Spouse        HH arranged  HH-2 PT      [facility] agency  [facility]   Status of service:  Completed, signed off Medicare Important Message given?   (If response is "NO", the following Medicare IM given date fields will be blank) Date Medicare IM given:   Date Additional Medicare IM given:    Discharge Disposition:  HOME W HOME HEALTH SERVICES  Per UR Regulation:    If discussed at Long Length of Stay Meetings, dates discussed:    Comments:

## 2012-04-29 NOTE — Progress Notes (Signed)
Physical Therapy Treatment Patient Details Name: [name] MRN: [id number] DOB: 06-17-1925 Today's Date: 04/29/2012 Time: 8295-6213 PT Time Calculation (min): 16 min  PT Assessment / Plan / Recommendation Comments on Treatment Session  Pt doing better this afternoon with BP.  BP prior to ambulation was 76/64, however she was asymptomatic throughout ambulation.  Does well with exercises.      Follow Up Recommendations  Home health PT    Barriers to Discharge        Equipment Recommendations  None recommended by PT    Recommendations for Other Services    Frequency 7X/week   Plan Discharge plan remains appropriate    Precautions / Restrictions Precautions Precautions: Knee Restrictions Weight Bearing Restrictions: No RLE Weight Bearing: Weight bearing as tolerated   Pertinent Vitals/Pain 5/10    Mobility  Bed Mobility Bed Mobility: Supine to Sit;Sit to Supine Supine to Sit: 4: Min guard;HOB elevated (approx 30 deg) Sit to Supine: 4: Min guard Details for Bed Mobility Assistance: Min/guard to ensure safety for RLE into and out of bed with cues for safety and technique.   Transfers Transfers: Sit to Stand;Stand to [employer] Transfers Sit to Stand: 4: Min guard;With upper extremity assist;From bed Stand to Sit: 4: Min guard;With upper extremity assist;To bed Details for Transfer Assistance: Cues for hand placement and safety due to pt very impulsive to stand without assist.  Ambulation/Gait Ambulation/Gait Assistance: 4: Min guard Ambulation Distance (Feet): 90 Feet Assistive device: Rolling walker Ambulation/Gait Assistance Details: Cues for sequencing/technique and positioning of RW due to tendency to let RW get too far ahead of her.  Pt remained assymptomatic throughout ambulation.  Gait Pattern: Step-through pattern;Decreased stride length;Trunk flexed    Exercises Total Joint Exercises Ankle Circles/Pumps: AROM;Both;10 reps Quad Sets: AROM;Right;10  reps Heel Slides: AAROM;Right;10 reps Hip ABduction/ADduction: AROM;Right;10 reps Straight Leg Raises: AROM;Right;10 reps   PT Diagnosis:    PT Problem List:   PT Treatment Interventions:     PT Goals Acute Rehab PT Goals PT Goal Formulation: With patient Time For Goal Achievement: 05/03/12 Potential to Achieve Goals: Good Pt will go Supine/Side to Sit: with supervision PT Goal: Supine/Side to Sit - Progress: Progressing toward goal Pt will go Sit to Supine/Side: with supervision PT Goal: Sit to Supine/Side - Progress: Progressing toward goal Pt will go Sit to Stand: with supervision PT Goal: Sit to Stand - Progress: Progressing toward goal Pt will Ambulate: 51 - 150 feet;with supervision;with least restrictive assistive device PT Goal: Ambulate - Progress: Progressing toward goal Pt will Perform Home Exercise Program: with supervision, verbal cues required/provided PT Goal: Perform Home Exercise Program - Progress: Progressing toward goal  Visit Information  Last PT Received On: 04/29/12 Assistance Needed: +1    Subjective Data  Subjective: I really want to walk Patient Stated Goal: to return home with husband.    Cognition  Overall Cognitive Status: Appears within functional limits for tasks assessed/performed Arousal/Alertness: Awake/alert Orientation Level: Appears intact for tasks assessed Behavior During Session: [facility] for tasks performed    Balance     End of Session PT - End of Session Equipment Utilized During Treatment: Gait belt Activity Tolerance: Patient tolerated treatment well Patient left: in bed;with call bell/phone within reach;with family/visitor present   GP     Page, Meribeth Mattes 04/29/2012, 4:39 PM

## 2012-04-30 LAB — CBC
HCT: 26.2 % — ABNORMAL LOW (ref 36.0–46.0)
Hemoglobin: 8.9 g/dL — ABNORMAL LOW (ref 12.0–15.0)
MCH: 31.6 pg (ref 26.0–34.0)
MCHC: 34 g/dL (ref 30.0–36.0)
RBC: 2.82 MIL/uL — ABNORMAL LOW (ref 3.87–5.11)

## 2012-04-30 LAB — TYPE AND SCREEN
Antibody Screen: POSITIVE
Donor AG Type: NEGATIVE
Donor AG Type: NEGATIVE
Unit division: 0

## 2012-04-30 LAB — BASIC METABOLIC PANEL
CO2: 32 mEq/L (ref 19–32)
Calcium: 7.9 mg/dL — ABNORMAL LOW (ref 8.4–10.5)
Creatinine, Ser: 0.99 mg/dL (ref 0.50–1.10)
GFR calc Af Amer: 59 mL/min — ABNORMAL LOW (ref >90)

## 2012-04-30 NOTE — Progress Notes (Signed)
Physical Therapy Treatment Patient Details Name: [name] MRN: [id number] DOB: 1925/01/05 Today's Date: 04/30/2012 Time: 9147-8295 PT Time Calculation (min): 26 min  PT Assessment / Plan / Recommendation Comments on Treatment Session  Pt continues to show improvement and able to perform stair negotiation.  Handout given on stairs. Pt plans to d/c home today with husband and will have supervision and assistance needed.      Follow Up Recommendations  Home health PT    Barriers to Discharge        Equipment Recommendations  None recommended by PT;None recommended by OT    Recommendations for Other Services    Frequency 7X/week   Plan Discharge plan remains appropriate    Precautions / Restrictions Precautions Precautions: Knee Restrictions Weight Bearing Restrictions: Yes RLE Weight Bearing: Weight bearing as tolerated   Pertinent Vitals/Pain No c/o pain    Mobility  Bed Mobility Bed Mobility: Not assessed Transfers Transfers: Sit to Stand;Stand to Sit;Stand Pivot Transfers Sit to Stand: 5: Supervision;From chair/3-in-1 Stand to Sit: 5: Supervision;To chair/3-in-1 Details for Transfer Assistance: Supervision for safety Ambulation/Gait Ambulation/Gait Assistance: 5: Supervision Ambulation Distance (Feet): 150 Feet Assistive device: Rolling walker Ambulation/Gait Assistance Details: Supervision for safety with cues for RW placement.  Pt tends to ambulate too far from RW. Gait Pattern: Step-through pattern;Decreased stride length;Trunk flexed Stairs: Yes Stairs Assistance: 4: Min assist Stairs Assistance Details (indicate cue type and reason): (A) to manage RW and maintain balance with cues for proper step seqence Stair Management Technique: With walker Number of Stairs: 3  Wheelchair Mobility Wheelchair Mobility: No    Exercises     PT Diagnosis:    PT Problem List:   PT Treatment Interventions:     PT Goals Acute Rehab PT Goals PT Goal Formulation: With  patient Time For Goal Achievement: 05/03/12 Potential to Achieve Goals: Good Pt will go Sit to Stand: with supervision PT Goal: Sit to Stand - Progress: Met Pt will Ambulate: 51 - 150 feet;with supervision;with least restrictive assistive device PT Goal: Ambulate - Progress: Progressing toward goal Pt will Go Up / Down Stairs: 1-2 stairs;with supervision;with least restrictive assistive device PT Goal: Up/Down Stairs - Progress: Partly met  Visit Information  Last PT Received On: 04/30/12 Assistance Needed: +1    Subjective Data  Subjective: I'm going home today. Patient Stated Goal: to return home with husband.    Cognition  Overall Cognitive Status: Appears within functional limits for tasks assessed/performed Arousal/Alertness: Awake/alert Orientation Level: Appears intact for tasks assessed Behavior During Session: [facility] for tasks performed    Balance     End of Session PT - End of Session Equipment Utilized During Treatment: Gait belt Activity Tolerance: Patient tolerated treatment well Patient left: in chair;with call bell/phone within reach Nurse Communication: Mobility status   GP     Cavin Longman 04/30/2012, 1:39 PM Jake Shark, PT DPT [phone]

## 2012-04-30 NOTE — Op Note (Signed)
[name], [name]               ACCOUNT NO.:  [id number]  MEDICAL RECORD NO.:  [id number]  LOCATION:  5N29C                        FACILITY:  [facility]  PHYSICIAN:  Mila Homer. Sherlean Foot, M.D. DATE OF BIRTH:  02/19/1925  DATE OF PROCEDURE:  04/29/2012 DATE OF DISCHARGE:                              OPERATIVE REPORT   SURGEON:  Mila Homer. Sherlean Foot, MD  ASSISTANT SURGEONS:  Altamese Cabal, PA-C  ANESTHESIA:  General.  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  PROCEDURE:  Right total knee revision with antibiotic cement spacer removal.  INDICATION FOR PROCEDURE:  The patient is an 78 year old white female with infected total knee.  My partner, Dr. Frederico Hamman, do a removal of cement spacer and she has been on antibiotics with normal labs and initially decided not to have the knee redone.  She then decided it was too painful and taken to the operating room for this revision after informed consent was obtained.  DESCRIPTION OF PROCEDURE:  The patient was laid supine and administered general anesthesia.  The right leg was prepped and draped in usual sterile fashion.  A femoral nerve block was done preoperatively.  The extremity was exsanguinated with the Esmarch.  Tourniquet was inflated to 350 mmHg.  I made a midline incision inline with the old incision.  I then developed planes 1-2 cm of undermining with first #10 blade.  I then incised the quadriceps tendon, medial straight parapatellar incision.  There was a very little preoperative motion at all.  I then performed an aggressive synovectomy, I had released lots of scar tissue from the quad to the femur.  I did get the knee to bend to about 80-85 degrees.  I then removed the antibiotic cement spacer with osteotome and mallet.  I then pieced these.  I then cleaned off the distal femur. Once I cleaned out the entire distal femur and did some medial releasing around through the  attachment of semimembranosus.  I was able to flex and internally rotate the femur to work on both femur and tibia.  I then sequentially reamed up to 16 on the tibia and 17 on the femur, put an 18 distally on the femur to accept the box, and at this point, sized at least to the E on the femur and then trialed the E femur with a 17 stem and it fit very well.  I then turned my attention to the tibia.  I put intramedullary 15 down, made a standard cut just beneath what bone was there to give me a fresh clean base of bone.  I then prepared with a size 5 tray and trialed a 5 tibia with a 16 stem, E femur with a 17 stem and a size 14 CCK femur and had good stability and good range of motion. 0 to about 85 degrees with the patella back up the top, I then cleaned off the patella scar tissue and then I drilled three lug holes with 32 template and then put the prosthetic trial in place, again 0-85 degrees with good tracking.  I then removed all the trial components, copiously irrigated, and then cemented in with antibiotic impregnated  Palacos cement in the tibia, the femur and the patella.  I removed all excess cement and allowed the cement to harden in extension.  I let the tourniquet down, obtained hemostasis.  I then lavaged again.  Placed a Hemovac deep to the arthrotomy.  On-Q pain pump superficial to the arthrotomy.  Closed the arthrotomy with #1 PDS suture.  Deep soft tissue was buried with 0 Vicryl sutures, subcuticular 2-0 Vicryl sutures and skin staples.  Dressings of Xeroform, dressing sponges, sterile Webril, and Ted stocking.  COMPLICATIONS:  None.  DRAINS:  One Hemovac and one On-Q.  EBL:  300 mL.  TOURNIQUET TIME:  90 minutes.          ______________________________ Mila Homer Sherlean Foot, M.D.     SDL/MEDQ  D:  04/29/2012  T:  04/30/2012  Job:  [id number]

## 2012-04-30 NOTE — Progress Notes (Signed)
Subjective: 2 Days Post-Op Procedure(s) (LRB): TOTAL KNEE REVISION (Right) Patient reports pain as 5 on 0-10 scale.    Objective: Vital signs in last 24 hours: Temp:  [97 F (36.1 C)-98.8 F (37.1 C)] 98.5 F (36.9 C) (08/28 0646) Pulse Rate:  [60-76] 76  (08/28 1045) Resp:  [15-20] 15  (08/28 0646) BP: (90-136)/(38-75) 108/48 mmHg (08/28 1045) SpO2:  [98 %-100 %] 98 % (08/28 0646)  Intake/Output from previous day: 08/27 0701 - 08/28 0700 In: 608.5 [P.O.:480; I.V.:116; Blood:12.5] Out: 675 [Urine:675] Intake/Output this shift:     Basename 04/30/12 0547 04/29/12 1335 04/29/12 0606  HGB 8.9* 7.6* 8.3*    Basename 04/30/12 0547 04/29/12 1335  WBC 5.3 5.6  RBC 2.82* 2.33*  HCT 26.2* 22.7*  PLT 166 197    Basename 04/30/12 0547 04/29/12 0606  NA 136 134*  K 3.9 3.7  CL 101 98  CO2 32 29  BUN 16 16  CREATININE 0.99 1.05  GLUCOSE 108* 121*  CALCIUM 7.9* 7.9*   No results found for this basename: LABPT:2,INR:2 in the last 72 hours  Neurologically intact ABD soft Neurovascular intact Dorsiflexion/Plantar flexion intact Incision: dressing C/D/I Compartment soft  Assessment/Plan: 2 Days Post-Op Procedure(s) (LRB): TOTAL KNEE REVISION (Right) Up with therapy Discharge home with home health  [name] B 04/30/2012, 11:17 AM

## 2012-04-30 NOTE — Evaluation (Signed)
Occupational Therapy Evaluation and Discharge Patient Details Name: [name] MRN: [id number] DOB: 05-Dec-1924 Today's Date: 04/30/2012 Time: 7829-5621 OT Time Calculation (min): 20 min  OT Assessment / Plan / Recommendation Clinical Impression  Pt s/p R TKA revision also with history of infection in joint (causing several knee surgeries) and acute anemia. Pt mobilizing at [facility] guard level. All education completed and pt with all necessary equipment and assist from husband. No further acute OT needs indicated at this time. Will sign off.     OT Assessment  Patient does not need any further OT services    Follow Up Recommendations  No OT follow up    Barriers to Discharge      Equipment Recommendations  None recommended by PT;None recommended by OT    Recommendations for Other Services    Frequency       Precautions / Restrictions Precautions Precautions: Knee Restrictions RLE Weight Bearing: Weight bearing as tolerated   Pertinent Vitals/Pain Pt reports "discomfort" with activity but denies "pain"      ADL  Grooming: Performed;Wash/dry face;Wash/dry hands;Teeth care;Supervision/safety Where Assessed - Grooming: Unsupported standing Upper Body Bathing: Set up;Performed Where Assessed - Upper Body Bathing: Unsupported sitting Lower Body Bathing: Performed;Min guard Where Assessed - Lower Body Bathing: Supported sit to stand Lower Body Dressing: Performed;+1 Total assistance (socks) Where Assessed - Lower Body Dressing: Unsupported sitting Toilet Transfer: Simulated;Min guard Toilet Transfer Method: Sit to Barista:  (from chair with armrests) Toileting - Clothing Manipulation and Hygiene: Simulated;Minimal assistance Where Assessed - Engineer, mining and Hygiene: Standing Tub/Shower Transfer: Paramedic Method: Science writer: Walk in shower Equipment Used: Rolling  walker;Knee Immobilizer;Gait belt Transfers/Ambulation Related to ADLs: Pt Min guard A with RW ambulation throughout room ADL Comments: Pt doing very well and states husband has been and will continue to be able to provide necessary level of assist. Pt is a very independent woman and will continue to seek less dependence on husband as she heals.    OT Diagnosis:    OT Problem List:   OT Treatment Interventions:     OT Goals    Visit Information  Last OT Received On: 04/30/12 Assistance Needed: +1    Subjective Data  Subjective: My husband is a real jewel Patient Stated Goal: Return home with husband   Prior Functioning  Vision/Perception  Home Living Lives With: Spouse Available Help at Discharge: Family;Available 24 hours/day Type of Home: House Home Access: Stairs to enter [employer] of Steps: 2 Entrance Stairs-Rails: Right Home Layout: Two level;Able to live on main level with bedroom/bathroom Bathroom Shower/Tub: Health visitor: Standard Home Adaptive Equipment: Shower chair with back;Bedside commode/3-in-1;Walker - rolling Prior Function Level of Independence: Needs assistance Needs Assistance: Meal Prep;Light Housekeeping Meal Prep: Moderate Light Housekeeping: Moderate Able to Take Stairs?: Yes Driving: No Vocation: Retired Musician: No difficulties Dominant Hand: Right      Cognition  Overall Cognitive Status: Appears within functional limits for tasks assessed/performed Arousal/Alertness: Awake/alert Orientation Level: Appears intact for tasks assessed Behavior During Session: [facility] for tasks performed    Extremity/Trunk Assessment Right Upper Extremity Assessment RUE ROM/Strength/Tone: WFL for tasks assessed RUE Coordination: WFL - gross/fine motor Left Upper Extremity Assessment LUE ROM/Strength/Tone: WFL for tasks assessed LUE Coordination: WFL - gross/fine motor   Mobility  Shoulder Instructions   Bed Mobility Bed Mobility: Not assessed Transfers Sit to Stand: 4: Min guard;With armrests;From chair/3-in-1 Stand to Sit: 5: Supervision;To chair/3-in-1;With  armrests Details for Transfer Assistance: Good hand placement. Min guard/ S for safety       Exercise     Balance     End of Session OT - End of Session Equipment Utilized During Treatment: Gait belt;Right knee immobilizer Activity Tolerance: Patient tolerated treatment well Patient left: in chair;with call bell/phone within reach Nurse Communication: Mobility status  GO     Glenna Brunkow 04/30/2012, 11:46 AM

## 2012-05-01 DIAGNOSIS — Z96659 Presence of unspecified artificial knee joint: Secondary | ICD-10-CM | POA: Diagnosis not present

## 2012-05-01 DIAGNOSIS — Z4789 Encounter for other orthopedic aftercare: Secondary | ICD-10-CM | POA: Diagnosis not present

## 2012-05-01 DIAGNOSIS — T84019A Broken internal joint prosthesis, unspecified site, initial encounter: Secondary | ICD-10-CM | POA: Diagnosis not present

## 2012-05-01 DIAGNOSIS — M1990 Unspecified osteoarthritis, unspecified site: Secondary | ICD-10-CM | POA: Diagnosis not present

## 2012-05-02 DIAGNOSIS — M1990 Unspecified osteoarthritis, unspecified site: Secondary | ICD-10-CM | POA: Diagnosis not present

## 2012-05-02 DIAGNOSIS — Z4789 Encounter for other orthopedic aftercare: Secondary | ICD-10-CM | POA: Diagnosis not present

## 2012-05-02 DIAGNOSIS — M1710 Unilateral primary osteoarthritis, unspecified knee: Secondary | ICD-10-CM | POA: Diagnosis not present

## 2012-05-02 DIAGNOSIS — Z96659 Presence of unspecified artificial knee joint: Secondary | ICD-10-CM | POA: Diagnosis not present

## 2012-05-03 DIAGNOSIS — Z4789 Encounter for other orthopedic aftercare: Secondary | ICD-10-CM | POA: Diagnosis not present

## 2012-05-03 DIAGNOSIS — Z96659 Presence of unspecified artificial knee joint: Secondary | ICD-10-CM | POA: Diagnosis not present

## 2012-05-03 DIAGNOSIS — M1990 Unspecified osteoarthritis, unspecified site: Secondary | ICD-10-CM | POA: Diagnosis not present

## 2012-05-04 DIAGNOSIS — M1990 Unspecified osteoarthritis, unspecified site: Secondary | ICD-10-CM | POA: Diagnosis not present

## 2012-05-04 DIAGNOSIS — Z4789 Encounter for other orthopedic aftercare: Secondary | ICD-10-CM | POA: Diagnosis not present

## 2012-05-04 DIAGNOSIS — Z96659 Presence of unspecified artificial knee joint: Secondary | ICD-10-CM | POA: Diagnosis not present

## 2012-05-05 DIAGNOSIS — Z96659 Presence of unspecified artificial knee joint: Secondary | ICD-10-CM | POA: Diagnosis not present

## 2012-05-05 DIAGNOSIS — Z4789 Encounter for other orthopedic aftercare: Secondary | ICD-10-CM | POA: Diagnosis not present

## 2012-05-05 DIAGNOSIS — M1990 Unspecified osteoarthritis, unspecified site: Secondary | ICD-10-CM | POA: Diagnosis not present

## 2012-05-08 DIAGNOSIS — Z4789 Encounter for other orthopedic aftercare: Secondary | ICD-10-CM | POA: Diagnosis not present

## 2012-05-08 DIAGNOSIS — Z96659 Presence of unspecified artificial knee joint: Secondary | ICD-10-CM | POA: Diagnosis not present

## 2012-05-08 DIAGNOSIS — M1990 Unspecified osteoarthritis, unspecified site: Secondary | ICD-10-CM | POA: Diagnosis not present

## 2012-05-09 DIAGNOSIS — Z96659 Presence of unspecified artificial knee joint: Secondary | ICD-10-CM | POA: Diagnosis not present

## 2012-05-09 DIAGNOSIS — M1990 Unspecified osteoarthritis, unspecified site: Secondary | ICD-10-CM | POA: Diagnosis not present

## 2012-05-09 DIAGNOSIS — Z4789 Encounter for other orthopedic aftercare: Secondary | ICD-10-CM | POA: Diagnosis not present

## 2012-05-12 DIAGNOSIS — M1990 Unspecified osteoarthritis, unspecified site: Secondary | ICD-10-CM | POA: Diagnosis not present

## 2012-05-12 DIAGNOSIS — Z96659 Presence of unspecified artificial knee joint: Secondary | ICD-10-CM | POA: Diagnosis not present

## 2012-05-12 DIAGNOSIS — Z4789 Encounter for other orthopedic aftercare: Secondary | ICD-10-CM | POA: Diagnosis not present

## 2012-05-13 DIAGNOSIS — Z96659 Presence of unspecified artificial knee joint: Secondary | ICD-10-CM | POA: Diagnosis not present

## 2012-05-13 DIAGNOSIS — M1990 Unspecified osteoarthritis, unspecified site: Secondary | ICD-10-CM | POA: Diagnosis not present

## 2012-05-13 DIAGNOSIS — T84019A Broken internal joint prosthesis, unspecified site, initial encounter: Secondary | ICD-10-CM | POA: Diagnosis not present

## 2012-05-13 DIAGNOSIS — Z4789 Encounter for other orthopedic aftercare: Secondary | ICD-10-CM | POA: Diagnosis not present

## 2012-05-14 DIAGNOSIS — Z96659 Presence of unspecified artificial knee joint: Secondary | ICD-10-CM | POA: Diagnosis not present

## 2012-05-14 DIAGNOSIS — Z4789 Encounter for other orthopedic aftercare: Secondary | ICD-10-CM | POA: Diagnosis not present

## 2012-05-14 DIAGNOSIS — M1990 Unspecified osteoarthritis, unspecified site: Secondary | ICD-10-CM | POA: Diagnosis not present

## 2012-05-15 DIAGNOSIS — Z4789 Encounter for other orthopedic aftercare: Secondary | ICD-10-CM | POA: Diagnosis not present

## 2012-05-15 DIAGNOSIS — M1990 Unspecified osteoarthritis, unspecified site: Secondary | ICD-10-CM | POA: Diagnosis not present

## 2012-05-15 DIAGNOSIS — Z96659 Presence of unspecified artificial knee joint: Secondary | ICD-10-CM | POA: Diagnosis not present

## 2012-05-16 DIAGNOSIS — Z4789 Encounter for other orthopedic aftercare: Secondary | ICD-10-CM | POA: Diagnosis not present

## 2012-05-16 DIAGNOSIS — M1990 Unspecified osteoarthritis, unspecified site: Secondary | ICD-10-CM | POA: Diagnosis not present

## 2012-05-16 DIAGNOSIS — Z96659 Presence of unspecified artificial knee joint: Secondary | ICD-10-CM | POA: Diagnosis not present

## 2012-05-19 DIAGNOSIS — Z96659 Presence of unspecified artificial knee joint: Secondary | ICD-10-CM | POA: Diagnosis not present

## 2012-05-19 DIAGNOSIS — Z4789 Encounter for other orthopedic aftercare: Secondary | ICD-10-CM | POA: Diagnosis not present

## 2012-05-19 DIAGNOSIS — M1990 Unspecified osteoarthritis, unspecified site: Secondary | ICD-10-CM | POA: Diagnosis not present

## 2012-05-21 DIAGNOSIS — Z96659 Presence of unspecified artificial knee joint: Secondary | ICD-10-CM | POA: Diagnosis not present

## 2012-05-21 DIAGNOSIS — M1990 Unspecified osteoarthritis, unspecified site: Secondary | ICD-10-CM | POA: Diagnosis not present

## 2012-05-21 DIAGNOSIS — Z4789 Encounter for other orthopedic aftercare: Secondary | ICD-10-CM | POA: Diagnosis not present

## 2012-05-23 DIAGNOSIS — M1990 Unspecified osteoarthritis, unspecified site: Secondary | ICD-10-CM | POA: Diagnosis not present

## 2012-05-23 DIAGNOSIS — Z96659 Presence of unspecified artificial knee joint: Secondary | ICD-10-CM | POA: Diagnosis not present

## 2012-05-23 DIAGNOSIS — Z4789 Encounter for other orthopedic aftercare: Secondary | ICD-10-CM | POA: Diagnosis not present

## 2012-05-29 ENCOUNTER — Ambulatory Visit: Payer: Medicare Other | Attending: Orthopedic Surgery | Admitting: Physical Therapy

## 2012-05-29 DIAGNOSIS — IMO0001 Reserved for inherently not codable concepts without codable children: Secondary | ICD-10-CM | POA: Insufficient documentation

## 2012-05-29 DIAGNOSIS — R262 Difficulty in walking, not elsewhere classified: Secondary | ICD-10-CM | POA: Diagnosis not present

## 2012-05-29 DIAGNOSIS — M25669 Stiffness of unspecified knee, not elsewhere classified: Secondary | ICD-10-CM | POA: Insufficient documentation

## 2012-05-29 DIAGNOSIS — M25569 Pain in unspecified knee: Secondary | ICD-10-CM | POA: Insufficient documentation

## 2012-05-30 ENCOUNTER — Encounter: Payer: Medicare Other | Admitting: Physical Therapy

## 2012-06-02 DIAGNOSIS — E890 Postprocedural hypothyroidism: Secondary | ICD-10-CM | POA: Diagnosis not present

## 2012-06-02 DIAGNOSIS — Z23 Encounter for immunization: Secondary | ICD-10-CM | POA: Diagnosis not present

## 2012-06-02 DIAGNOSIS — C73 Malignant neoplasm of thyroid gland: Secondary | ICD-10-CM | POA: Diagnosis not present

## 2012-06-02 DIAGNOSIS — M8440XA Pathological fracture, unspecified site, initial encounter for fracture: Secondary | ICD-10-CM | POA: Diagnosis not present

## 2012-06-02 DIAGNOSIS — E785 Hyperlipidemia, unspecified: Secondary | ICD-10-CM | POA: Diagnosis not present

## 2012-06-02 DIAGNOSIS — I10 Essential (primary) hypertension: Secondary | ICD-10-CM | POA: Diagnosis not present

## 2012-06-03 ENCOUNTER — Ambulatory Visit: Payer: Medicare Other | Attending: Orthopedic Surgery | Admitting: Physical Therapy

## 2012-06-03 DIAGNOSIS — IMO0001 Reserved for inherently not codable concepts without codable children: Secondary | ICD-10-CM | POA: Insufficient documentation

## 2012-06-03 DIAGNOSIS — R262 Difficulty in walking, not elsewhere classified: Secondary | ICD-10-CM | POA: Insufficient documentation

## 2012-06-03 DIAGNOSIS — M25569 Pain in unspecified knee: Secondary | ICD-10-CM | POA: Diagnosis not present

## 2012-06-03 DIAGNOSIS — M25669 Stiffness of unspecified knee, not elsewhere classified: Secondary | ICD-10-CM | POA: Insufficient documentation

## 2012-06-04 ENCOUNTER — Ambulatory Visit: Payer: Medicare Other | Admitting: Physical Therapy

## 2012-06-06 ENCOUNTER — Ambulatory Visit: Payer: Medicare Other | Admitting: Physical Therapy

## 2012-06-09 ENCOUNTER — Ambulatory Visit: Payer: Medicare Other | Admitting: Physical Therapy

## 2012-06-10 ENCOUNTER — Other Ambulatory Visit: Payer: Self-pay | Admitting: Orthopedic Surgery

## 2012-06-10 ENCOUNTER — Ambulatory Visit
Admission: RE | Admit: 2012-06-10 | Discharge: 2012-06-10 | Disposition: A | Discharge status: Home / Self Care | Payer: Medicare Other | Source: Ambulatory Visit | Attending: Orthopedic Surgery | Admitting: Orthopedic Surgery

## 2012-06-10 DIAGNOSIS — M7989 Other specified soft tissue disorders: Secondary | ICD-10-CM | POA: Diagnosis not present

## 2012-06-10 DIAGNOSIS — M79604 Pain in right leg: Secondary | ICD-10-CM

## 2012-06-10 DIAGNOSIS — M79609 Pain in unspecified limb: Secondary | ICD-10-CM | POA: Diagnosis not present

## 2012-06-12 ENCOUNTER — Ambulatory Visit: Payer: Medicare Other | Admitting: Physical Therapy

## 2012-06-17 ENCOUNTER — Ambulatory Visit: Payer: Medicare Other | Admitting: Physical Therapy

## 2012-06-19 ENCOUNTER — Ambulatory Visit: Payer: Medicare Other | Admitting: Physical Therapy

## 2012-06-20 DIAGNOSIS — M961 Postlaminectomy syndrome, not elsewhere classified: Secondary | ICD-10-CM | POA: Diagnosis not present

## 2012-06-20 DIAGNOSIS — M2580 Other specified joint disorders, unspecified joint: Secondary | ICD-10-CM | POA: Diagnosis not present

## 2012-06-20 DIAGNOSIS — IMO0002 Reserved for concepts with insufficient information to code with codable children: Secondary | ICD-10-CM | POA: Diagnosis not present

## 2012-07-02 DIAGNOSIS — E890 Postprocedural hypothyroidism: Secondary | ICD-10-CM | POA: Diagnosis not present

## 2012-07-17 DIAGNOSIS — I10 Essential (primary) hypertension: Secondary | ICD-10-CM | POA: Diagnosis not present

## 2012-07-17 DIAGNOSIS — R609 Edema, unspecified: Secondary | ICD-10-CM | POA: Diagnosis not present

## 2012-08-05 DIAGNOSIS — T84019A Broken internal joint prosthesis, unspecified site, initial encounter: Secondary | ICD-10-CM | POA: Diagnosis not present

## 2012-08-12 DIAGNOSIS — M2580 Other specified joint disorders, unspecified joint: Secondary | ICD-10-CM | POA: Diagnosis not present

## 2012-08-12 DIAGNOSIS — M961 Postlaminectomy syndrome, not elsewhere classified: Secondary | ICD-10-CM | POA: Diagnosis not present

## 2012-08-12 DIAGNOSIS — IMO0002 Reserved for concepts with insufficient information to code with codable children: Secondary | ICD-10-CM | POA: Diagnosis not present

## 2012-09-22 DIAGNOSIS — E890 Postprocedural hypothyroidism: Secondary | ICD-10-CM | POA: Diagnosis not present

## 2012-09-23 DIAGNOSIS — H4011X0 Primary open-angle glaucoma, stage unspecified: Secondary | ICD-10-CM | POA: Diagnosis not present

## 2012-09-23 DIAGNOSIS — H40029 Open angle with borderline findings, high risk, unspecified eye: Secondary | ICD-10-CM | POA: Diagnosis not present

## 2012-09-23 DIAGNOSIS — Z961 Presence of intraocular lens: Secondary | ICD-10-CM | POA: Diagnosis not present

## 2012-09-23 DIAGNOSIS — H26499 Other secondary cataract, unspecified eye: Secondary | ICD-10-CM | POA: Diagnosis not present

## 2012-10-07 DIAGNOSIS — IMO0002 Reserved for concepts with insufficient information to code with codable children: Secondary | ICD-10-CM | POA: Diagnosis not present

## 2012-10-21 ENCOUNTER — Encounter: Payer: Self-pay | Admitting: Cardiology

## 2012-11-04 DIAGNOSIS — Z96659 Presence of unspecified artificial knee joint: Secondary | ICD-10-CM | POA: Diagnosis not present

## 2012-12-02 DIAGNOSIS — IMO0002 Reserved for concepts with insufficient information to code with codable children: Secondary | ICD-10-CM | POA: Diagnosis not present

## 2012-12-02 DIAGNOSIS — Z79899 Other long term (current) drug therapy: Secondary | ICD-10-CM | POA: Diagnosis not present

## 2012-12-02 DIAGNOSIS — M961 Postlaminectomy syndrome, not elsewhere classified: Secondary | ICD-10-CM | POA: Diagnosis not present

## 2012-12-02 DIAGNOSIS — M2580 Other specified joint disorders, unspecified joint: Secondary | ICD-10-CM | POA: Diagnosis not present

## 2012-12-10 DIAGNOSIS — E890 Postprocedural hypothyroidism: Secondary | ICD-10-CM | POA: Diagnosis not present

## 2012-12-10 DIAGNOSIS — I10 Essential (primary) hypertension: Secondary | ICD-10-CM | POA: Diagnosis not present

## 2012-12-10 DIAGNOSIS — C73 Malignant neoplasm of thyroid gland: Secondary | ICD-10-CM | POA: Diagnosis not present

## 2012-12-10 DIAGNOSIS — E785 Hyperlipidemia, unspecified: Secondary | ICD-10-CM | POA: Diagnosis not present

## 2012-12-10 DIAGNOSIS — M8440XA Pathological fracture, unspecified site, initial encounter for fracture: Secondary | ICD-10-CM | POA: Diagnosis not present

## 2013-01-27 DIAGNOSIS — M961 Postlaminectomy syndrome, not elsewhere classified: Secondary | ICD-10-CM | POA: Diagnosis not present

## 2013-01-27 DIAGNOSIS — IMO0002 Reserved for concepts with insufficient information to code with codable children: Secondary | ICD-10-CM | POA: Diagnosis not present

## 2013-01-27 DIAGNOSIS — M129 Arthropathy, unspecified: Secondary | ICD-10-CM | POA: Diagnosis not present

## 2013-01-27 DIAGNOSIS — M2580 Other specified joint disorders, unspecified joint: Secondary | ICD-10-CM | POA: Diagnosis not present

## 2013-02-03 DIAGNOSIS — M7020 Olecranon bursitis, unspecified elbow: Secondary | ICD-10-CM | POA: Diagnosis not present

## 2013-02-17 DIAGNOSIS — M7020 Olecranon bursitis, unspecified elbow: Secondary | ICD-10-CM | POA: Diagnosis not present

## 2013-03-23 DIAGNOSIS — M129 Arthropathy, unspecified: Secondary | ICD-10-CM | POA: Diagnosis not present

## 2013-03-23 DIAGNOSIS — IMO0002 Reserved for concepts with insufficient information to code with codable children: Secondary | ICD-10-CM | POA: Diagnosis not present

## 2013-03-23 DIAGNOSIS — M961 Postlaminectomy syndrome, not elsewhere classified: Secondary | ICD-10-CM | POA: Diagnosis not present

## 2013-03-25 DIAGNOSIS — H353190 Nonexudative age-related macular degeneration, unspecified eye, stage unspecified: Secondary | ICD-10-CM | POA: Diagnosis not present

## 2013-03-25 DIAGNOSIS — Z961 Presence of intraocular lens: Secondary | ICD-10-CM | POA: Diagnosis not present

## 2013-03-25 DIAGNOSIS — H4011X0 Primary open-angle glaucoma, stage unspecified: Secondary | ICD-10-CM | POA: Diagnosis not present

## 2013-05-05 DIAGNOSIS — Z96659 Presence of unspecified artificial knee joint: Secondary | ICD-10-CM | POA: Diagnosis not present

## 2013-05-19 DIAGNOSIS — IMO0002 Reserved for concepts with insufficient information to code with codable children: Secondary | ICD-10-CM | POA: Diagnosis not present

## 2013-05-19 DIAGNOSIS — M5137 Other intervertebral disc degeneration, lumbosacral region: Secondary | ICD-10-CM | POA: Diagnosis not present

## 2013-05-19 DIAGNOSIS — M961 Postlaminectomy syndrome, not elsewhere classified: Secondary | ICD-10-CM | POA: Diagnosis not present

## 2013-06-17 DIAGNOSIS — C73 Malignant neoplasm of thyroid gland: Secondary | ICD-10-CM | POA: Diagnosis not present

## 2013-06-17 DIAGNOSIS — M810 Age-related osteoporosis without current pathological fracture: Secondary | ICD-10-CM | POA: Diagnosis not present

## 2013-06-17 DIAGNOSIS — I10 Essential (primary) hypertension: Secondary | ICD-10-CM | POA: Diagnosis not present

## 2013-06-17 DIAGNOSIS — E785 Hyperlipidemia, unspecified: Secondary | ICD-10-CM | POA: Diagnosis not present

## 2013-06-17 DIAGNOSIS — E890 Postprocedural hypothyroidism: Secondary | ICD-10-CM | POA: Diagnosis not present

## 2013-06-17 DIAGNOSIS — IMO0002 Reserved for concepts with insufficient information to code with codable children: Secondary | ICD-10-CM | POA: Diagnosis not present

## 2013-06-17 DIAGNOSIS — Z23 Encounter for immunization: Secondary | ICD-10-CM | POA: Diagnosis not present

## 2013-07-15 DIAGNOSIS — M2580 Other specified joint disorders, unspecified joint: Secondary | ICD-10-CM | POA: Diagnosis not present

## 2013-07-15 DIAGNOSIS — IMO0002 Reserved for concepts with insufficient information to code with codable children: Secondary | ICD-10-CM | POA: Diagnosis not present

## 2013-07-15 DIAGNOSIS — M5137 Other intervertebral disc degeneration, lumbosacral region: Secondary | ICD-10-CM | POA: Diagnosis not present

## 2013-07-15 DIAGNOSIS — M961 Postlaminectomy syndrome, not elsewhere classified: Secondary | ICD-10-CM | POA: Diagnosis not present

## 2013-09-10 DIAGNOSIS — M961 Postlaminectomy syndrome, not elsewhere classified: Secondary | ICD-10-CM | POA: Diagnosis not present

## 2013-09-10 DIAGNOSIS — IMO0002 Reserved for concepts with insufficient information to code with codable children: Secondary | ICD-10-CM | POA: Diagnosis not present

## 2013-09-10 DIAGNOSIS — M129 Arthropathy, unspecified: Secondary | ICD-10-CM | POA: Diagnosis not present

## 2013-09-10 DIAGNOSIS — M5137 Other intervertebral disc degeneration, lumbosacral region: Secondary | ICD-10-CM | POA: Diagnosis not present

## 2013-09-29 DIAGNOSIS — H353190 Nonexudative age-related macular degeneration, unspecified eye, stage unspecified: Secondary | ICD-10-CM | POA: Diagnosis not present

## 2013-09-29 DIAGNOSIS — H40019 Open angle with borderline findings, low risk, unspecified eye: Secondary | ICD-10-CM | POA: Diagnosis not present

## 2013-09-29 DIAGNOSIS — H4011X0 Primary open-angle glaucoma, stage unspecified: Secondary | ICD-10-CM | POA: Diagnosis not present

## 2013-09-29 DIAGNOSIS — Z961 Presence of intraocular lens: Secondary | ICD-10-CM | POA: Diagnosis not present

## 2013-10-09 IMAGING — CR DG CHEST 1V PORT
1 series · 1 of 1 positions shown · non-contrast
Comparison: 03/30/2011.

CLINICAL DATA: Preoperative chest radiograph.

PORTABLE CHEST - 1 VIEW

[view not recorded]
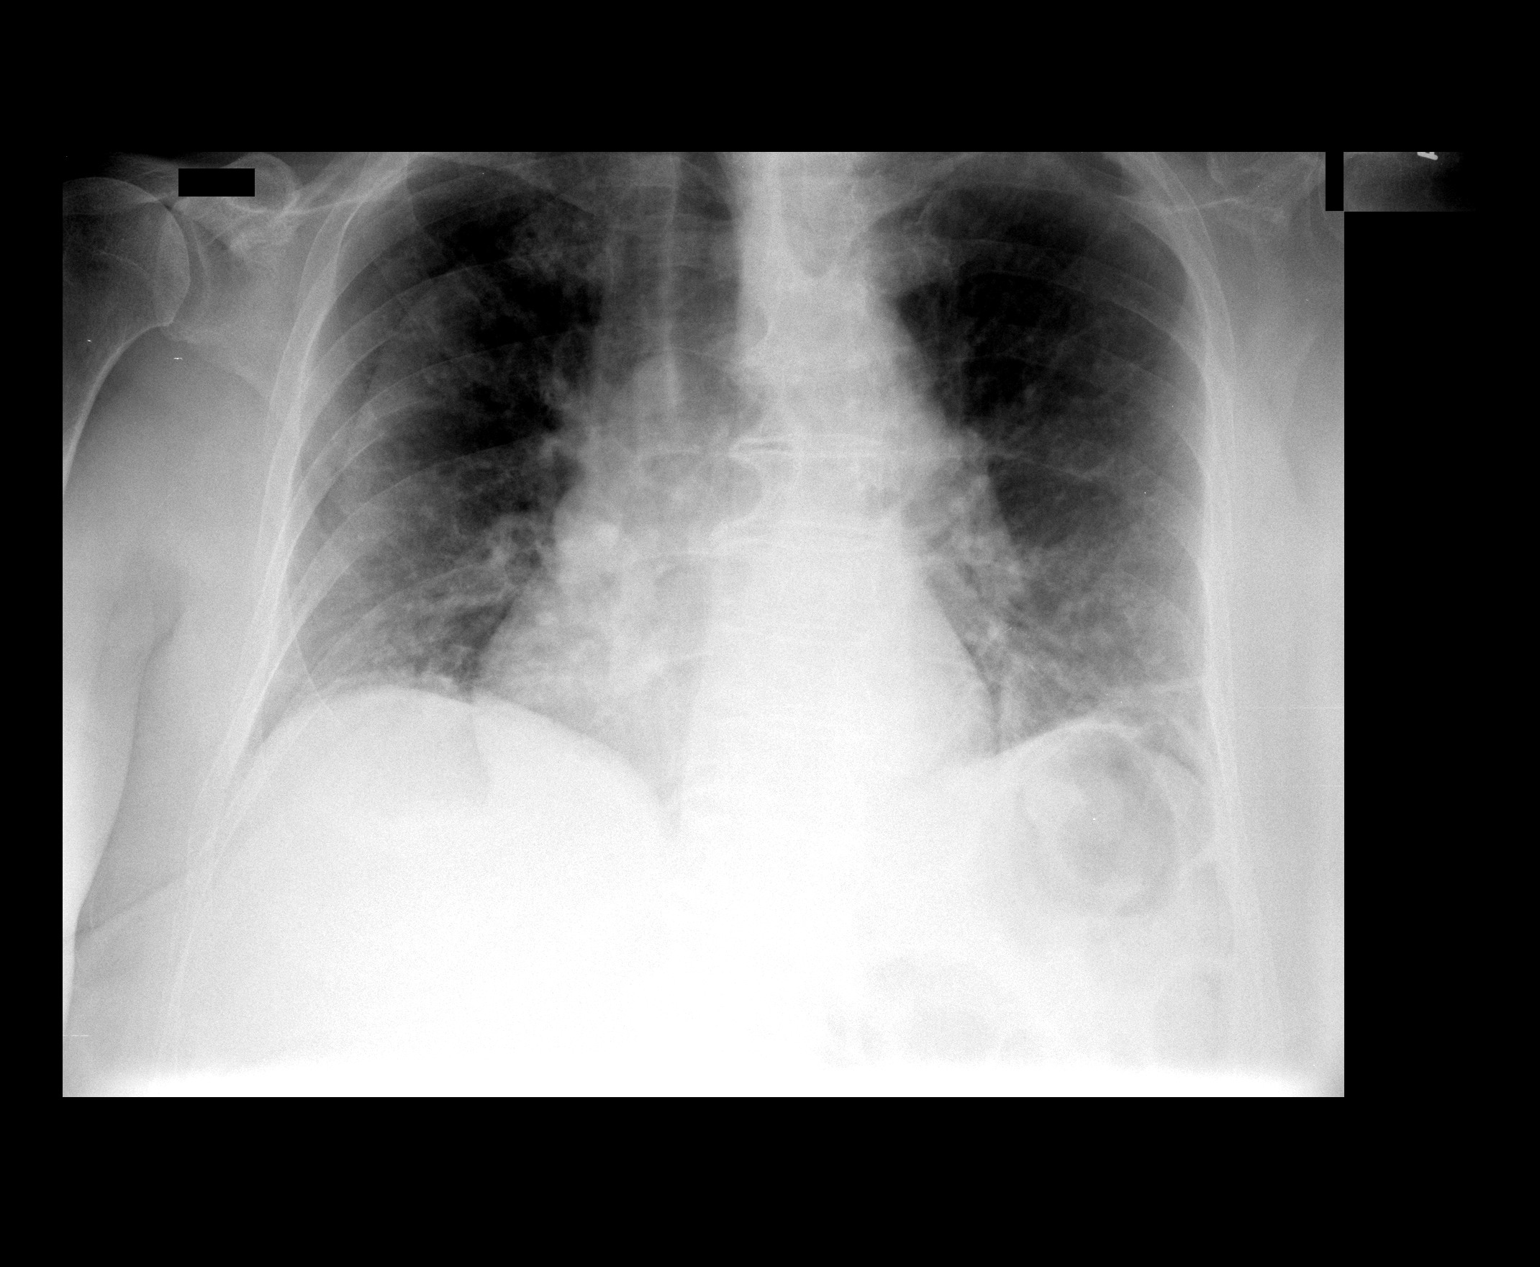

[1 of 1 positions shown; findings below may reference images not displayed]

FINDINGS: Low lung volumes.  Low volumes accentuate the
cardiopericardial silhouette.  Left greater than right basilar
atelectasis.  Prominent skin fold is present in the right upper
chest.  Lung markings are visualized peripheral to this.  No
effusion is identified. No airspace disease or edema is identified.
IMPRESSION: Low volume chest with left greater than right basilar atelectasis.

## 2013-11-02 DIAGNOSIS — M961 Postlaminectomy syndrome, not elsewhere classified: Secondary | ICD-10-CM | POA: Diagnosis not present

## 2013-11-02 DIAGNOSIS — M5137 Other intervertebral disc degeneration, lumbosacral region: Secondary | ICD-10-CM | POA: Diagnosis not present

## 2013-11-02 DIAGNOSIS — M129 Arthropathy, unspecified: Secondary | ICD-10-CM | POA: Insufficient documentation

## 2013-11-02 DIAGNOSIS — IMO0002 Reserved for concepts with insufficient information to code with codable children: Secondary | ICD-10-CM | POA: Diagnosis not present

## 2013-11-18 IMAGING — CR DG CHEST 2V
2 series · 2 of 2 positions shown · non-contrast
Comparison: 11/09/2011

CLINICAL DATA: Leg swelling, evaluate for CHF

CHEST - 2 VIEW

[w chest pa]
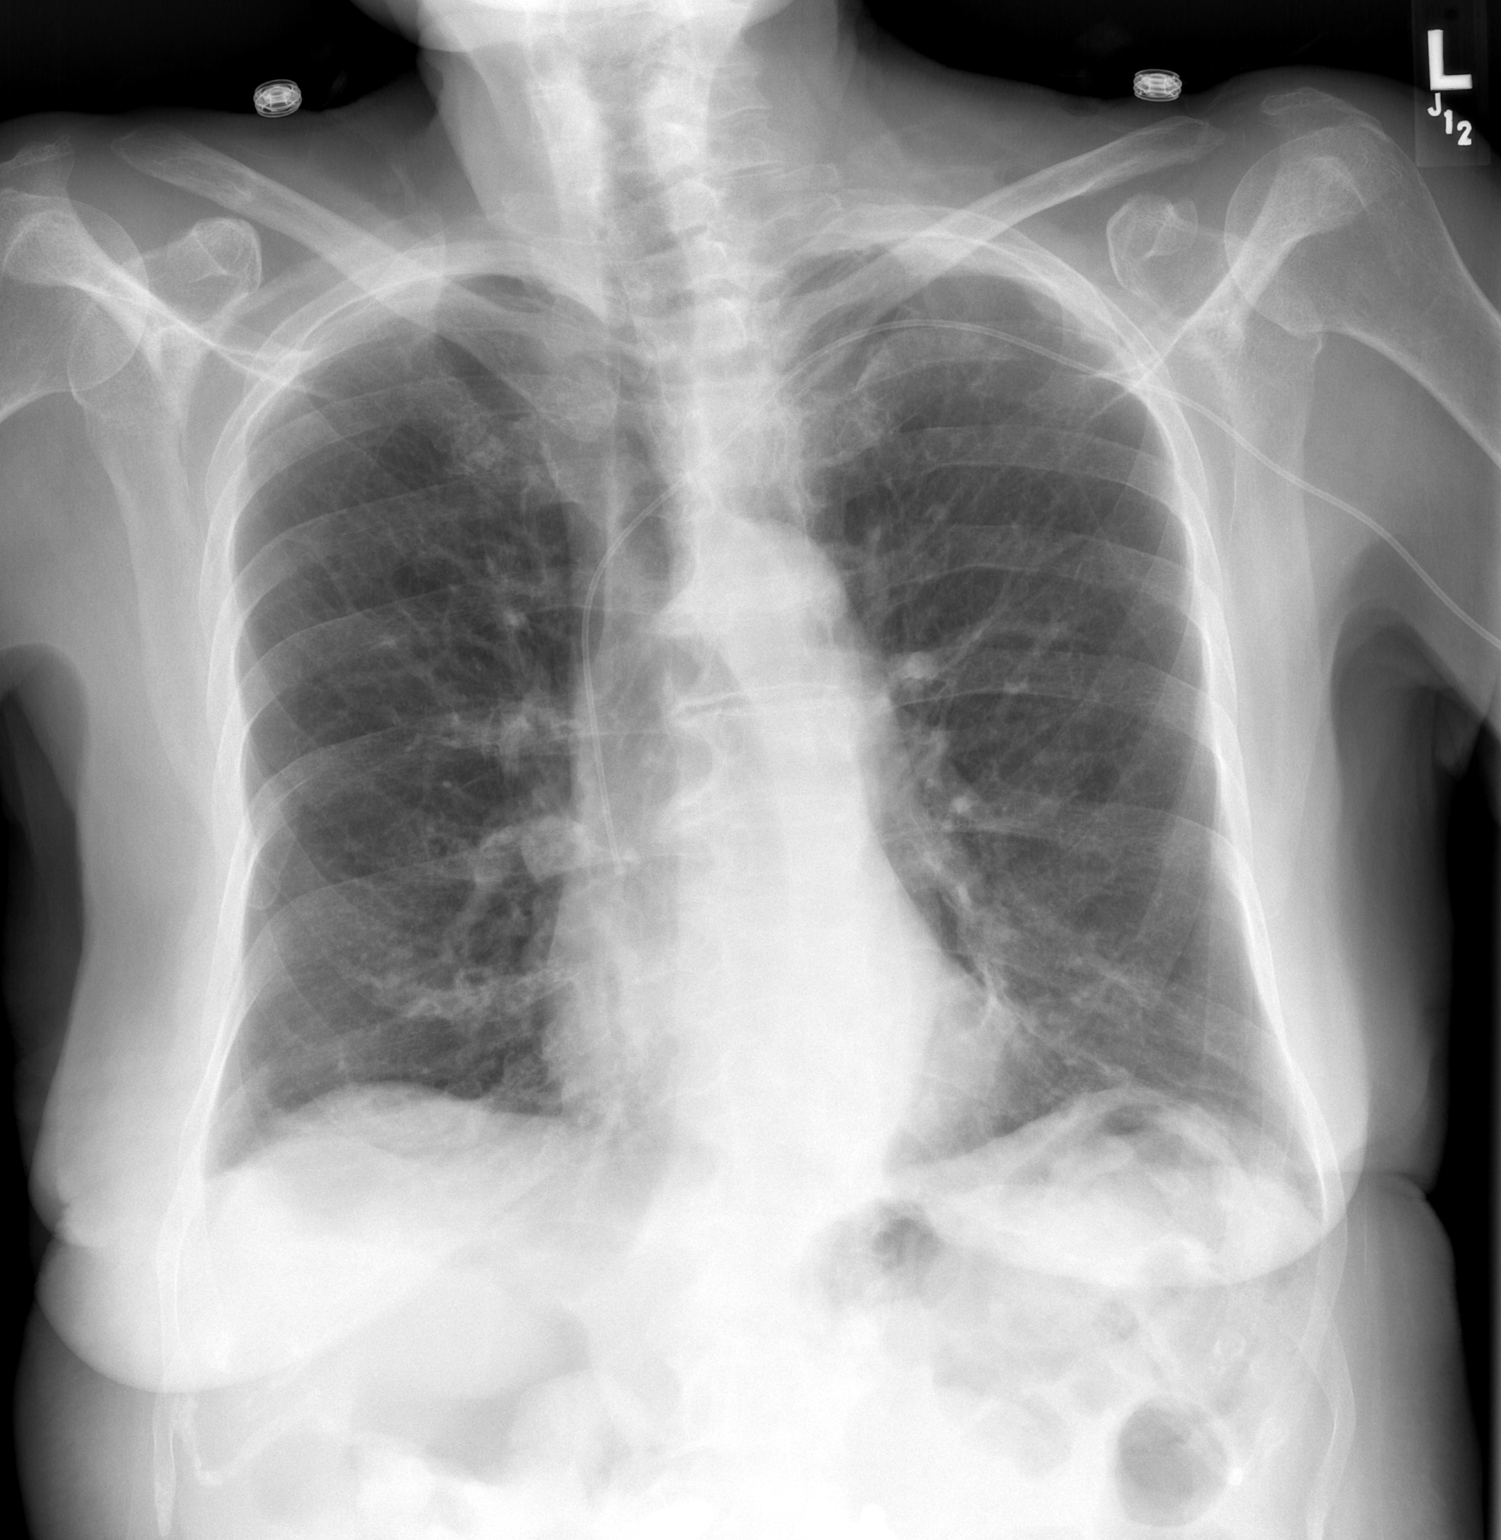

[w chest lat]
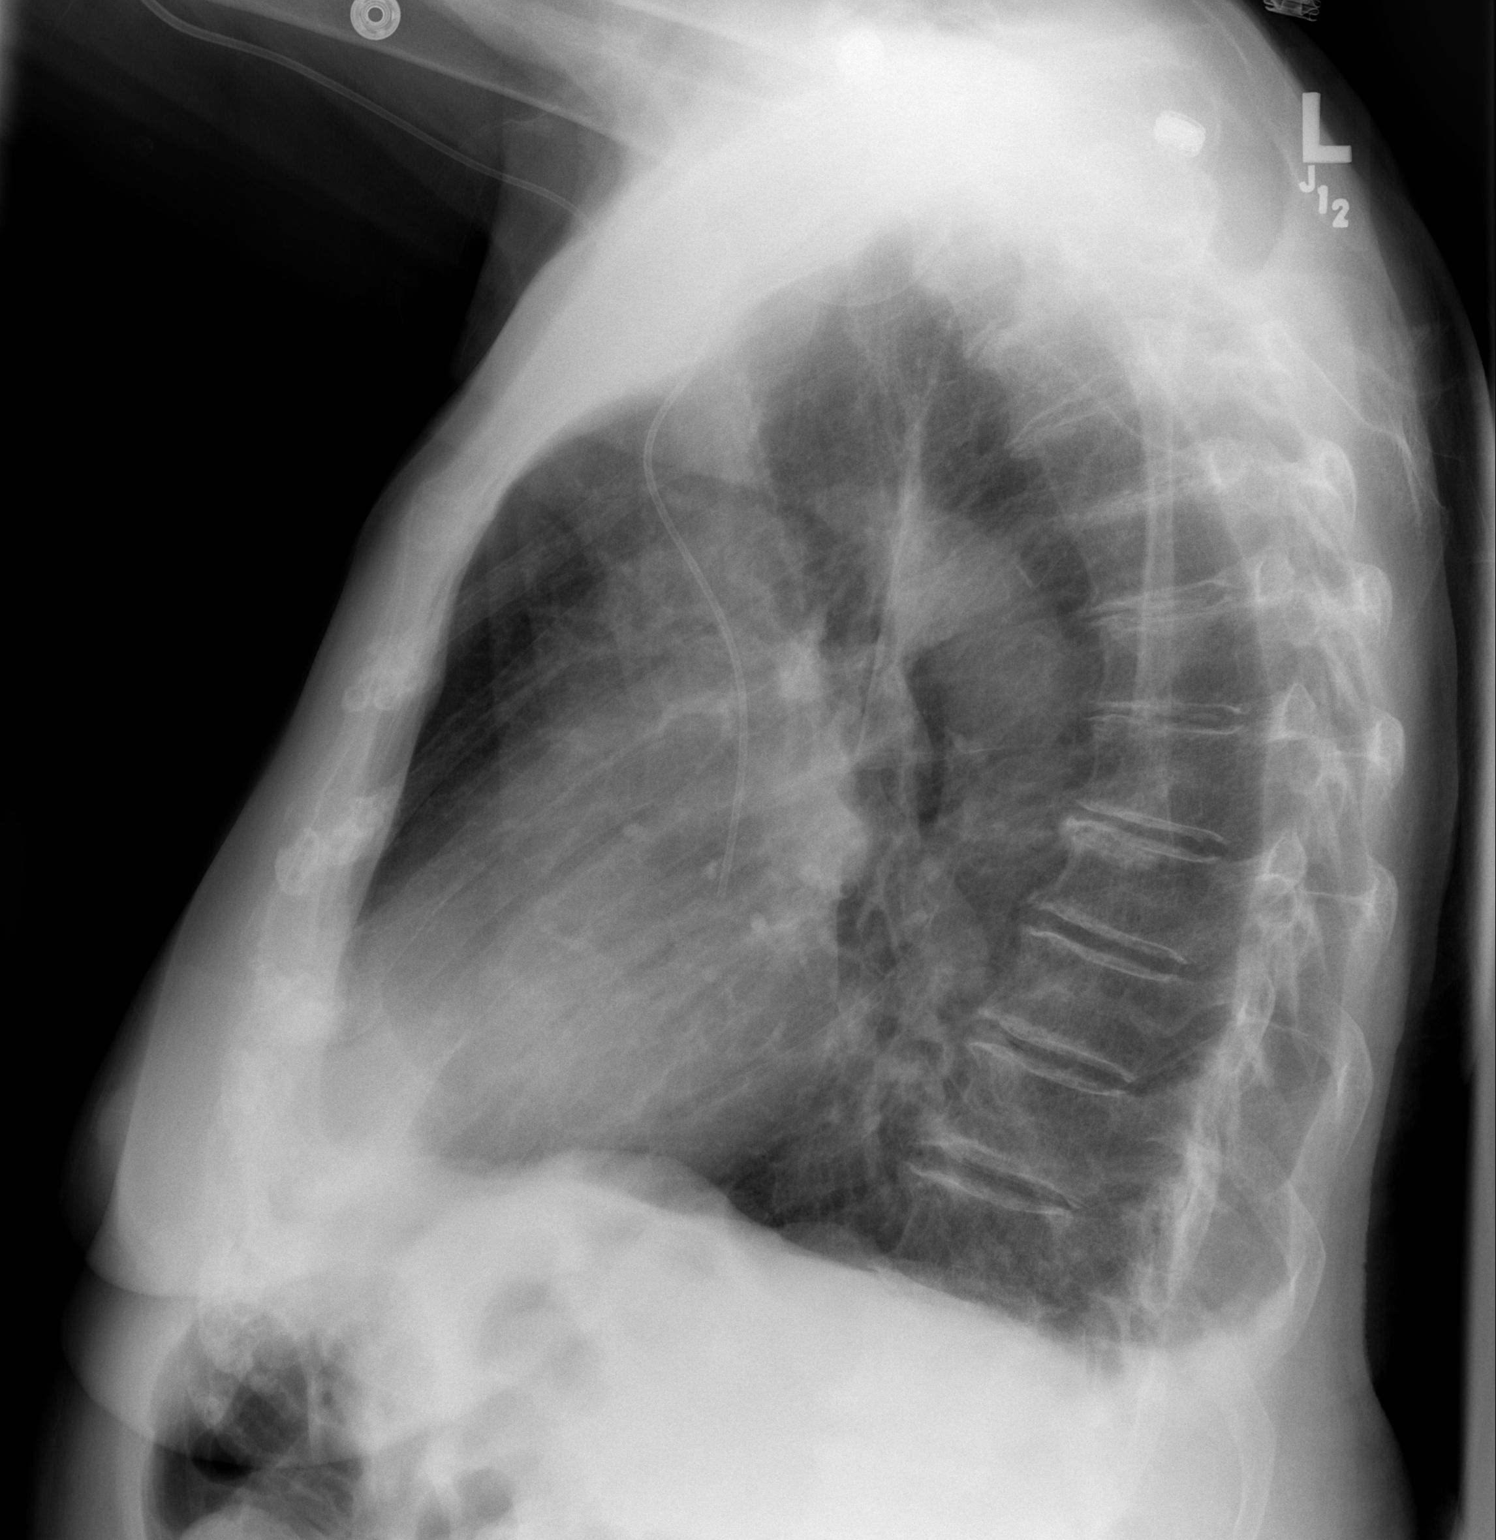

[2 of 2 positions shown; findings below may reference images not displayed]

FINDINGS: Lungs are essentially clear. No pleural effusion or
pneumothorax.

The heart is normal in size.

Left arm PICC.

Degenerative changes of the visualized thoracolumbar spine.
IMPRESSION: No evidence of acute cardiopulmonary disease.

## 2013-12-03 DIAGNOSIS — Z1331 Encounter for screening for depression: Secondary | ICD-10-CM | POA: Diagnosis not present

## 2013-12-03 DIAGNOSIS — G894 Chronic pain syndrome: Secondary | ICD-10-CM | POA: Diagnosis not present

## 2013-12-03 DIAGNOSIS — E890 Postprocedural hypothyroidism: Secondary | ICD-10-CM | POA: Diagnosis not present

## 2013-12-03 DIAGNOSIS — Z23 Encounter for immunization: Secondary | ICD-10-CM | POA: Diagnosis not present

## 2013-12-03 DIAGNOSIS — R110 Nausea: Secondary | ICD-10-CM | POA: Diagnosis not present

## 2013-12-03 DIAGNOSIS — R609 Edema, unspecified: Secondary | ICD-10-CM | POA: Diagnosis not present

## 2013-12-15 DIAGNOSIS — M25519 Pain in unspecified shoulder: Secondary | ICD-10-CM | POA: Diagnosis not present

## 2013-12-22 DIAGNOSIS — E785 Hyperlipidemia, unspecified: Secondary | ICD-10-CM | POA: Diagnosis not present

## 2013-12-22 DIAGNOSIS — IMO0002 Reserved for concepts with insufficient information to code with codable children: Secondary | ICD-10-CM | POA: Diagnosis not present

## 2013-12-22 DIAGNOSIS — C73 Malignant neoplasm of thyroid gland: Secondary | ICD-10-CM | POA: Diagnosis not present

## 2013-12-22 DIAGNOSIS — E890 Postprocedural hypothyroidism: Secondary | ICD-10-CM | POA: Diagnosis not present

## 2013-12-22 DIAGNOSIS — M810 Age-related osteoporosis without current pathological fracture: Secondary | ICD-10-CM | POA: Diagnosis not present

## 2013-12-22 DIAGNOSIS — I10 Essential (primary) hypertension: Secondary | ICD-10-CM | POA: Diagnosis not present

## 2013-12-28 DIAGNOSIS — M5137 Other intervertebral disc degeneration, lumbosacral region: Secondary | ICD-10-CM | POA: Insufficient documentation

## 2013-12-28 DIAGNOSIS — C801 Malignant (primary) neoplasm, unspecified: Secondary | ICD-10-CM | POA: Insufficient documentation

## 2013-12-28 DIAGNOSIS — B029 Zoster without complications: Secondary | ICD-10-CM | POA: Insufficient documentation

## 2013-12-28 DIAGNOSIS — M129 Arthropathy, unspecified: Secondary | ICD-10-CM | POA: Diagnosis not present

## 2013-12-28 DIAGNOSIS — IMO0002 Reserved for concepts with insufficient information to code with codable children: Secondary | ICD-10-CM | POA: Diagnosis not present

## 2013-12-28 DIAGNOSIS — M5416 Radiculopathy, lumbar region: Secondary | ICD-10-CM | POA: Insufficient documentation

## 2013-12-28 DIAGNOSIS — M961 Postlaminectomy syndrome, not elsewhere classified: Secondary | ICD-10-CM | POA: Diagnosis not present

## 2013-12-28 DIAGNOSIS — L98499 Non-pressure chronic ulcer of skin of other sites with unspecified severity: Secondary | ICD-10-CM | POA: Insufficient documentation

## 2013-12-28 DIAGNOSIS — H409 Unspecified glaucoma: Secondary | ICD-10-CM | POA: Insufficient documentation

## 2014-01-13 DIAGNOSIS — N390 Urinary tract infection, site not specified: Secondary | ICD-10-CM | POA: Diagnosis not present

## 2014-01-13 DIAGNOSIS — R634 Abnormal weight loss: Secondary | ICD-10-CM | POA: Diagnosis not present

## 2014-01-13 DIAGNOSIS — R112 Nausea with vomiting, unspecified: Secondary | ICD-10-CM | POA: Diagnosis not present

## 2014-01-13 DIAGNOSIS — T783XXA Angioneurotic edema, initial encounter: Secondary | ICD-10-CM | POA: Diagnosis not present

## 2014-01-13 DIAGNOSIS — E039 Hypothyroidism, unspecified: Secondary | ICD-10-CM | POA: Diagnosis not present

## 2014-01-14 DIAGNOSIS — E039 Hypothyroidism, unspecified: Secondary | ICD-10-CM | POA: Diagnosis not present

## 2014-01-20 DIAGNOSIS — R112 Nausea with vomiting, unspecified: Secondary | ICD-10-CM | POA: Diagnosis not present

## 2014-01-20 DIAGNOSIS — N390 Urinary tract infection, site not specified: Secondary | ICD-10-CM | POA: Diagnosis not present

## 2014-01-20 DIAGNOSIS — E039 Hypothyroidism, unspecified: Secondary | ICD-10-CM | POA: Diagnosis not present

## 2014-02-18 ENCOUNTER — Inpatient Hospital Stay (HOSPITAL_COMMUNITY): Payer: Medicare Other

## 2014-02-18 ENCOUNTER — Inpatient Hospital Stay (HOSPITAL_COMMUNITY)
Admission: EM | Admit: 2014-02-18 | Discharge: 2014-02-20 | DRG: 640 | Disposition: A | Discharge status: Home / Self Care | Payer: Medicare Other | Attending: Internal Medicine | Admitting: Internal Medicine

## 2014-02-18 ENCOUNTER — Encounter (HOSPITAL_COMMUNITY): Payer: Self-pay | Admitting: Emergency Medicine

## 2014-02-18 DIAGNOSIS — H3530 Unspecified macular degeneration: Secondary | ICD-10-CM | POA: Diagnosis present

## 2014-02-18 DIAGNOSIS — E860 Dehydration: Secondary | ICD-10-CM | POA: Diagnosis present

## 2014-02-18 DIAGNOSIS — E890 Postprocedural hypothyroidism: Secondary | ICD-10-CM | POA: Diagnosis present

## 2014-02-18 DIAGNOSIS — Z880 Allergy status to penicillin: Secondary | ICD-10-CM

## 2014-02-18 DIAGNOSIS — E872 Acidosis, unspecified: Principal | ICD-10-CM

## 2014-02-18 DIAGNOSIS — IMO0002 Reserved for concepts with insufficient information to code with codable children: Secondary | ICD-10-CM | POA: Diagnosis not present

## 2014-02-18 DIAGNOSIS — M549 Dorsalgia, unspecified: Secondary | ICD-10-CM | POA: Diagnosis present

## 2014-02-18 DIAGNOSIS — Z888 Allergy status to other drugs, medicaments and biological substances status: Secondary | ICD-10-CM | POA: Diagnosis not present

## 2014-02-18 DIAGNOSIS — A491 Streptococcal infection, unspecified site: Secondary | ICD-10-CM

## 2014-02-18 DIAGNOSIS — G8929 Other chronic pain: Secondary | ICD-10-CM | POA: Diagnosis present

## 2014-02-18 DIAGNOSIS — K5289 Other specified noninfective gastroenteritis and colitis: Secondary | ICD-10-CM | POA: Diagnosis present

## 2014-02-18 DIAGNOSIS — Z85850 Personal history of malignant neoplasm of thyroid: Secondary | ICD-10-CM | POA: Diagnosis not present

## 2014-02-18 DIAGNOSIS — L270 Generalized skin eruption due to drugs and medicaments taken internally: Secondary | ICD-10-CM

## 2014-02-18 DIAGNOSIS — E034 Atrophy of thyroid (acquired): Secondary | ICD-10-CM

## 2014-02-18 DIAGNOSIS — D72829 Elevated white blood cell count, unspecified: Secondary | ICD-10-CM | POA: Diagnosis present

## 2014-02-18 DIAGNOSIS — R600 Localized edema: Secondary | ICD-10-CM

## 2014-02-18 DIAGNOSIS — R634 Abnormal weight loss: Secondary | ICD-10-CM | POA: Diagnosis present

## 2014-02-18 DIAGNOSIS — D62 Acute posthemorrhagic anemia: Secondary | ICD-10-CM

## 2014-02-18 DIAGNOSIS — E43 Unspecified severe protein-calorie malnutrition: Secondary | ICD-10-CM | POA: Diagnosis present

## 2014-02-18 DIAGNOSIS — I10 Essential (primary) hypertension: Secondary | ICD-10-CM

## 2014-02-18 DIAGNOSIS — Z79899 Other long term (current) drug therapy: Secondary | ICD-10-CM | POA: Diagnosis not present

## 2014-02-18 DIAGNOSIS — Z96659 Presence of unspecified artificial knee joint: Secondary | ICD-10-CM | POA: Diagnosis not present

## 2014-02-18 DIAGNOSIS — R197 Diarrhea, unspecified: Secondary | ICD-10-CM | POA: Diagnosis not present

## 2014-02-18 DIAGNOSIS — E8729 Other acidosis: Secondary | ICD-10-CM

## 2014-02-18 DIAGNOSIS — K219 Gastro-esophageal reflux disease without esophagitis: Secondary | ICD-10-CM | POA: Diagnosis present

## 2014-02-18 DIAGNOSIS — E039 Hypothyroidism, unspecified: Secondary | ICD-10-CM | POA: Diagnosis not present

## 2014-02-18 DIAGNOSIS — H409 Unspecified glaucoma: Secondary | ICD-10-CM | POA: Diagnosis present

## 2014-02-18 DIAGNOSIS — K529 Noninfective gastroenteritis and colitis, unspecified: Secondary | ICD-10-CM | POA: Diagnosis present

## 2014-02-18 DIAGNOSIS — R21 Rash and other nonspecific skin eruption: Secondary | ICD-10-CM | POA: Diagnosis present

## 2014-02-18 DIAGNOSIS — E876 Hypokalemia: Secondary | ICD-10-CM | POA: Diagnosis not present

## 2014-02-18 DIAGNOSIS — R5383 Other fatigue: Secondary | ICD-10-CM | POA: Diagnosis not present

## 2014-02-18 DIAGNOSIS — R52 Pain, unspecified: Secondary | ICD-10-CM

## 2014-02-18 DIAGNOSIS — E440 Moderate protein-calorie malnutrition: Secondary | ICD-10-CM | POA: Insufficient documentation

## 2014-02-18 DIAGNOSIS — M129 Arthropathy, unspecified: Secondary | ICD-10-CM | POA: Diagnosis present

## 2014-02-18 DIAGNOSIS — R404 Transient alteration of awareness: Secondary | ICD-10-CM | POA: Diagnosis not present

## 2014-02-18 DIAGNOSIS — Z7901 Long term (current) use of anticoagulants: Secondary | ICD-10-CM | POA: Diagnosis not present

## 2014-02-18 DIAGNOSIS — Z66 Do not resuscitate: Secondary | ICD-10-CM | POA: Diagnosis present

## 2014-02-18 DIAGNOSIS — D649 Anemia, unspecified: Secondary | ICD-10-CM

## 2014-02-18 DIAGNOSIS — J358 Other chronic diseases of tonsils and adenoids: Secondary | ICD-10-CM

## 2014-02-18 DIAGNOSIS — R5381 Other malaise: Secondary | ICD-10-CM | POA: Diagnosis not present

## 2014-02-18 DIAGNOSIS — R918 Other nonspecific abnormal finding of lung field: Secondary | ICD-10-CM | POA: Diagnosis not present

## 2014-02-18 DIAGNOSIS — N3289 Other specified disorders of bladder: Secondary | ICD-10-CM | POA: Diagnosis not present

## 2014-02-18 HISTORY — DX: Acidosis, unspecified: E87.20

## 2014-02-18 HISTORY — DX: Acidosis: E87.2

## 2014-02-18 LAB — BASIC METABOLIC PANEL
BUN: 22 mg/dL (ref 6–23)
CHLORIDE: 93 meq/L — AB (ref 96–112)
CO2: 20 mEq/L (ref 19–32)
Calcium: 7.7 mg/dL — ABNORMAL LOW (ref 8.4–10.5)
Creatinine, Ser: 0.74 mg/dL (ref 0.50–1.10)
GFR calc non Af Amer: 75 mL/min — ABNORMAL LOW (ref >90)
GFR, EST AFRICAN AMERICAN: 87 mL/min — AB (ref >90)
Glucose, Bld: 150 mg/dL — ABNORMAL HIGH (ref 70–99)
Potassium: 2.7 mEq/L — CL (ref 3.7–5.3)
Sodium: 132 mEq/L — ABNORMAL LOW (ref 137–147)

## 2014-02-18 LAB — COMPREHENSIVE METABOLIC PANEL
ALBUMIN: 2.8 g/dL — AB (ref 3.5–5.2)
ALK PHOS: 110 U/L (ref 39–117)
ALT: 6 U/L (ref 0–35)
AST: 11 U/L (ref 0–37)
BILIRUBIN TOTAL: 0.7 mg/dL (ref 0.3–1.2)
BUN: 22 mg/dL (ref 6–23)
CHLORIDE: 88 meq/L — AB (ref 96–112)
CO2: 13 meq/L — AB (ref 19–32)
CREATININE: 0.89 mg/dL (ref 0.50–1.10)
Calcium: 8.3 mg/dL — ABNORMAL LOW (ref 8.4–10.5)
GFR calc Af Amer: 66 mL/min — ABNORMAL LOW (ref >90)
GFR, EST NON AFRICAN AMERICAN: 57 mL/min — AB (ref >90)
Glucose, Bld: 126 mg/dL — ABNORMAL HIGH (ref 70–99)
POTASSIUM: 3.1 meq/L — AB (ref 3.7–5.3)
Sodium: 135 mEq/L — ABNORMAL LOW (ref 137–147)
Total Protein: 6.2 g/dL (ref 6.0–8.3)

## 2014-02-18 LAB — CBC WITH DIFFERENTIAL/PLATELET
BASOS ABS: 0 10*3/uL (ref 0.0–0.1)
Basophils Relative: 0 % (ref 0–1)
Eosinophils Absolute: 0 10*3/uL (ref 0.0–0.7)
Eosinophils Relative: 0 % (ref 0–5)
HCT: 45.4 % (ref 36.0–46.0)
Hemoglobin: 14.6 g/dL (ref 12.0–15.0)
LYMPHS ABS: 0.6 10*3/uL — AB (ref 0.7–4.0)
Lymphocytes Relative: 3 % — ABNORMAL LOW (ref 12–46)
MCH: 28.7 pg (ref 26.0–34.0)
MCHC: 32.2 g/dL (ref 30.0–36.0)
MCV: 89.4 fL (ref 78.0–100.0)
MONO ABS: 1.3 10*3/uL — AB (ref 0.1–1.0)
Monocytes Relative: 7 % (ref 3–12)
NEUTROS PCT: 90 % — AB (ref 43–77)
Neutro Abs: 17.1 10*3/uL — ABNORMAL HIGH (ref 1.7–7.7)
PLATELETS: 516 10*3/uL — AB (ref 150–400)
RBC: 5.08 MIL/uL (ref 3.87–5.11)
RDW: 14.4 % (ref 11.5–15.5)
SMEAR REVIEW: INCREASED
WBC: 19 10*3/uL — AB (ref 4.0–10.5)

## 2014-02-18 LAB — LIPASE, BLOOD: LIPASE: 7 U/L — AB (ref 11–59)

## 2014-02-18 LAB — URINALYSIS, ROUTINE W REFLEX MICROSCOPIC
Bilirubin Urine: NEGATIVE
GLUCOSE, UA: NEGATIVE mg/dL
Hgb urine dipstick: NEGATIVE
Ketones, ur: 40 mg/dL — AB
LEUKOCYTES UA: NEGATIVE
NITRITE: NEGATIVE
PH: 6 (ref 5.0–8.0)
PROTEIN: NEGATIVE mg/dL
Specific Gravity, Urine: 1.021 (ref 1.005–1.030)
Urobilinogen, UA: 1 mg/dL (ref 0.0–1.0)

## 2014-02-18 LAB — MAGNESIUM: Magnesium: 3 mg/dL — ABNORMAL HIGH (ref 1.5–2.5)

## 2014-02-18 LAB — LACTIC ACID, PLASMA: Lactic Acid, Venous: 7 mmol/L — ABNORMAL HIGH (ref 0.5–2.2)

## 2014-02-18 LAB — TROPONIN I: Troponin I: <0.3 ng/mL (ref <0.30)

## 2014-02-18 LAB — RAPID STREP SCREEN (MED CTR MEBANE ONLY): STREPTOCOCCUS, GROUP A SCREEN (DIRECT): NEGATIVE

## 2014-02-18 LAB — TSH: TSH: 0.061 u[IU]/mL — AB (ref 0.350–4.500)

## 2014-02-18 MED ORDER — ONDANSETRON HCL 4 MG PO TABS: 4.0000 mg | ORAL_TABLET | Freq: Four times a day (QID) | ORAL | Status: DC | PRN

## 2014-02-18 MED ORDER — ACETAMINOPHEN 650 MG RE SUPP: 650.0000 mg | Freq: Four times a day (QID) | RECTAL | Status: DC | PRN

## 2014-02-18 MED ORDER — SODIUM CHLORIDE 0.9 % IV SOLN
INTRAVENOUS | Status: DC
  Administered 2014-02-18: 17:00:00 via INTRAVENOUS
  Filled 2014-02-18 (×5): qty 1000

## 2014-02-18 MED ORDER — CIPROFLOXACIN IN D5W 400 MG/200ML IV SOLN
400.0000 mg | Freq: Two times a day (BID) | INTRAVENOUS | Status: DC
  Administered 2014-02-18 – 2014-02-19 (×2): 400 mg via INTRAVENOUS
  Filled 2014-02-18 (×3): qty 200

## 2014-02-18 MED ORDER — SODIUM CHLORIDE 0.9 % IJ SOLN
3.0000 mL | Freq: Two times a day (BID) | INTRAMUSCULAR | Status: DC
  Administered 2014-02-19 – 2014-02-20 (×2): 3 mL via INTRAVENOUS

## 2014-02-18 MED ORDER — ALUM & MAG HYDROXIDE-SIMETH 200-200-20 MG/5ML PO SUSP: 30.0000 mL | Freq: Four times a day (QID) | ORAL | Status: DC | PRN

## 2014-02-18 MED ORDER — MORPHINE SULFATE 2 MG/ML IJ SOLN
1.0000 mg | INTRAMUSCULAR | Status: DC | PRN
  Administered 2014-02-19 (×2): 2 mg via INTRAVENOUS
  Administered 2014-02-19: 1 mg via INTRAVENOUS
  Administered 2014-02-20: 2 mg via INTRAVENOUS
  Filled 2014-02-18 (×4): qty 1

## 2014-02-18 MED ORDER — SODIUM CHLORIDE 0.9 % IV BOLUS (SEPSIS)
1000.0000 mL | Freq: Once | INTRAVENOUS | Status: AC
  Administered 2014-02-18: 1000 mL via INTRAVENOUS

## 2014-02-18 MED ORDER — VANCOMYCIN 50 MG/ML ORAL SOLUTION: 125.0000 mg | Freq: Four times a day (QID) | ORAL | Status: DC

## 2014-02-18 MED ORDER — LEVOTHYROXINE SODIUM 100 MCG PO TABS: 100.0000 ug | ORAL_TABLET | Freq: Every day | ORAL | Status: DC

## 2014-02-18 MED ORDER — OXYCODONE HCL ER 15 MG PO T12A
15.0000 mg | EXTENDED_RELEASE_TABLET | Freq: Two times a day (BID) | ORAL | Status: DC
  Administered 2014-02-18 – 2014-02-20 (×4): 15 mg via ORAL
  Filled 2014-02-18 (×4): qty 1

## 2014-02-18 MED ORDER — OXYCODONE HCL 15 MG PO TB12: 10.0000 mg | ORAL_TABLET | Freq: Two times a day (BID) | ORAL | Status: DC

## 2014-02-18 MED ORDER — DORZOLAMIDE HCL-TIMOLOL MAL 2-0.5 % OP SOLN
1.0000 [drp] | Freq: Two times a day (BID) | OPHTHALMIC | Status: DC
  Administered 2014-02-18 – 2014-02-20 (×4): 1 [drp] via OPHTHALMIC
  Filled 2014-02-18: qty 10

## 2014-02-18 MED ORDER — ENOXAPARIN SODIUM 30 MG/0.3ML ~~LOC~~ SOLN
40.0000 mg | Freq: Every day | SUBCUTANEOUS | Status: DC
  Administered 2014-02-18: 40 mg via SUBCUTANEOUS
  Filled 2014-02-18 (×2): qty 0.4

## 2014-02-18 MED ORDER — METRONIDAZOLE IN NACL 5-0.79 MG/ML-% IV SOLN
500.0000 mg | Freq: Three times a day (TID) | INTRAVENOUS | Status: DC
  Administered 2014-02-19 (×2): 500 mg via INTRAVENOUS
  Filled 2014-02-18 (×4): qty 100

## 2014-02-18 MED ORDER — ACETAMINOPHEN 325 MG PO TABS: 650.0000 mg | ORAL_TABLET | Freq: Four times a day (QID) | ORAL | Status: DC | PRN

## 2014-02-18 MED ORDER — POTASSIUM CHLORIDE 10 MEQ/100ML IV SOLN
10.0000 meq | Freq: Once | INTRAVENOUS | Status: AC
  Administered 2014-02-18: 10 meq via INTRAVENOUS
  Filled 2014-02-18: qty 100

## 2014-02-18 MED ORDER — PANTOPRAZOLE SODIUM 40 MG IV SOLR
40.0000 mg | INTRAVENOUS | Status: DC
Start: 2014-02-18 — End: 2014-02-19
  Administered 2014-02-18: 40 mg via INTRAVENOUS
  Filled 2014-02-18 (×2): qty 40

## 2014-02-18 MED ORDER — IOHEXOL 300 MG/ML  SOLN
80.0000 mL | Freq: Once | INTRAMUSCULAR | Status: AC | PRN
  Administered 2014-02-18: 80 mL via INTRAVENOUS

## 2014-02-18 MED ORDER — LATANOPROST 0.005 % OP SOLN
1.0000 [drp] | Freq: Every day | OPHTHALMIC | Status: DC
  Administered 2014-02-18 – 2014-02-19 (×2): 1 [drp] via OPHTHALMIC
  Filled 2014-02-18: qty 2.5

## 2014-02-18 MED ORDER — POLYETHYLENE GLYCOL 3350 17 G PO PACK
17.0000 g | PACK | Freq: Every day | ORAL | Status: DC
  Administered 2014-02-20: 17 g via ORAL
  Filled 2014-02-18 (×3): qty 1

## 2014-02-18 MED ORDER — ONDANSETRON HCL 4 MG/2ML IJ SOLN: 4.0000 mg | Freq: Three times a day (TID) | INTRAMUSCULAR | Status: DC | PRN

## 2014-02-18 MED ORDER — CIPROFLOXACIN IN D5W 400 MG/200ML IV SOLN
400.0000 mg | Freq: Two times a day (BID) | INTRAVENOUS | Status: DC
  Filled 2014-02-18 (×2): qty 200

## 2014-02-18 MED ORDER — METRONIDAZOLE IN NACL 5-0.79 MG/ML-% IV SOLN
500.0000 mg | Freq: Three times a day (TID) | INTRAVENOUS | Status: DC
  Administered 2014-02-18: 500 mg via INTRAVENOUS
  Filled 2014-02-18 (×3): qty 100

## 2014-02-18 MED ORDER — DIAZEPAM 5 MG PO TABS
5.0000 mg | ORAL_TABLET | Freq: Every evening | ORAL | Status: DC | PRN
  Administered 2014-02-19: 5 mg via ORAL
  Filled 2014-02-18 (×2): qty 1

## 2014-02-18 MED ORDER — LEVOTHYROXINE SODIUM 150 MCG PO TABS
150.0000 ug | ORAL_TABLET | Freq: Every day | ORAL | Status: DC
  Administered 2014-02-19: 150 ug via ORAL
  Filled 2014-02-18 (×4): qty 1

## 2014-02-18 MED ORDER — GABAPENTIN 300 MG PO CAPS
300.0000 mg | ORAL_CAPSULE | Freq: Every day | ORAL | Status: DC
  Administered 2014-02-18 – 2014-02-19 (×2): 300 mg via ORAL
  Filled 2014-02-18 (×3): qty 1

## 2014-02-18 MED ORDER — POTASSIUM CHLORIDE 10 MEQ/100ML IV SOLN
10.0000 meq | INTRAVENOUS | Status: AC
  Administered 2014-02-18 – 2014-02-19 (×5): 10 meq via INTRAVENOUS
  Filled 2014-02-18 (×5): qty 100

## 2014-02-18 MED ORDER — ONDANSETRON HCL 4 MG/2ML IJ SOLN: 4.0000 mg | Freq: Four times a day (QID) | INTRAMUSCULAR | Status: DC | PRN

## 2014-02-18 MED ORDER — PROPRANOLOL HCL 20 MG PO TABS
20.0000 mg | ORAL_TABLET | Freq: Every day | ORAL | Status: DC
  Administered 2014-02-19 – 2014-02-20 (×2): 20 mg via ORAL
  Filled 2014-02-18 (×3): qty 1

## 2014-02-18 NOTE — Progress Notes (Signed)
NURSING PROGRESS NOTE  [name] [id number] Admission Data: 02/18/2014 4:57 PM Attending Ayahna Solazzo: Jonetta Osgood, MD BOF:[id number], MD Code Status: DNR  [name] is a 80 y.o. female patient admitted from ED:  -No acute distress noted.  -No complaints of shortness of breath.  -No complaints of chest pain.   Cardiac Monitoring: Box # 03 in place. Cardiac monitor yields:normal sinus rhythm  Blood pressure 116/67, pulse 94, temperature 96.5 F (35.8 C), temperature source Axillary, resp. rate 18, height 5\' 4"  (1.626 m), weight 52.799 kg (116 lb 6.4 oz), SpO2 100.00%.   IV Fluids:  IV in place, occlusive dsg intact without redness, IV cath wrist right, condition patent and no redness normal saline with 40 KCL @ 100.   Allergies:  Demerol; Lorazepam; Penicillins; Tetracyclines & related; and Vancomycin  Past Medical History:   has a past medical history of Angina; Headache(784.0); Hypoglycemia; Fracture (2008); Macular degeneration of both eyes; Glaucoma; Hypothyroidism; Blood transfusion; Anemia; GERD (gastroesophageal reflux disease); H/O hiatal hernia; Arthritis; Chronic back pain; Thyroid cancer (1986); Shortness of breath (11/08/11); Exertional dyspnea (01/17/12); Osteomyelitis (11/2011); Hypertension; and Pneumonia (01/2010).  Past Surgical History:   has past surgical history that includes Thyroidectomy (1986); Shoulder arthroscopy w/ rotator cuff repair; Trigger finger release; Abdominal hysterectomy (1963); Carpal tunnel release; Tonsillectomy and adenoidectomy; Appendectomy; Dilation and curettage of uterus; Cataract extraction w/ intraocular lens  implant, bilateral; Joint replacement (2005, 2006, 2012, 11/10/11); goiter removed (1980's); Fracture surgery; hand OR (09/2011); I&D knee with poly exchange (11/10/2011); post polyethelene exchange (11/09/11); Back surgery; Peripherally inserted central catheter insertion (11/2011); and Total knee revision (04/28/2012).  Social  History:   reports that she has never smoked. She has never used smokeless tobacco. She reports that she does not drink alcohol or use illicit drugs.  Skin: dry intact   Patient/Family orientated to room. Information packet given to patient/family. Admission inpatient armband information verified with patient/family to include name and date of birth and placed on patient arm. Side rails up x 2, fall assessment and education completed with patient/family. Patient/family able to verbalize understanding of risk associated with falls and verbalized understanding to call for assistance before getting out of bed. Call light within reach. Patient/family able to voice and demonstrate understanding of unit orientation instructions.    Will continue to evaluate and treat per MD orders.

## 2014-02-18 NOTE — H&P (Signed)
[facility]                                                                                    Patient Demographics  [name], is a 80 y.o. female  MRN: [id number]   DOB - 1925-02-11  Admit Date - 02/18/2014  Outpatient Primary MD for the patient is Merrilee Seashore, MD   With History of -  Past Medical History  Diagnosis Date  . Angina   . Headache(784.0)   . Hypoglycemia   . Fracture 2008    back  . Macular degeneration of both eyes   . Glaucoma   . Hypothyroidism   . Blood transfusion   . Anemia   . GERD (gastroesophageal reflux disease)   . H/O hiatal hernia   . Arthritis   . Chronic back pain   . Thyroid cancer 1986  . Shortness of breath 11/08/11    "here lately, all the time"  . Exertional dyspnea 01/17/12  . Osteomyelitis 11/2011    right knee  . Hypertension   . Pneumonia 01/2010      Past Surgical History  Procedure Laterality Date  . Thyroidectomy  1986  . Shoulder arthroscopy w/ rotator cuff repair    . Trigger finger release      right  thumb  . Abdominal hysterectomy  1963  . Carpal tunnel release      bilaterally  . Tonsillectomy and adenoidectomy    . Appendectomy    . Dilation and curettage of uterus    . Cataract extraction w/ intraocular lens  implant, bilateral    . Joint replacement  2005, 2006, 2012, 11/10/11    left; left; right; right; right  . Goiter removed  1980's    "when I was young"  . Fracture surgery      "broke back"  . Hand or  09/2011    "left hand; bones were coming together"  . I&d knee with poly exchange  11/10/2011    Procedure: IRRIGATION AND DEBRIDEMENT KNEE WITH POLY EXCHANGE;  Surgeon: Yvette Rack., MD;  Location: [place];  Service: Orthopedics;  Laterality: Right;  . Post polyethelene exchange  11/09/11    RLE  . Back surgery      "3 back OR's;"; S/P "fractured back 2008"  . Peripherally inserted central catheter insertion  11/2011    "removed 12/2011; LUE"  . Total knee revision  04/28/2012    Procedure:  TOTAL KNEE REVISION;  Surgeon: Rudean Haskell, MD;  Location: [place];  Service: Orthopedics;  Laterality: Right;    in for   Chief Complaint  Patient presents with  . Weakness     HPI  [name]  is a 80 y.o. female, presents today after an episode of diarrhea. Family at bedside reports 4 month history of nausea, 40 lb weight loss, intermittent constipation and diarrhea. Denies any vomiting. Pt has h/o multiple back surgeries and chronic back pain for which she receives narcotics. She took [employer] and prune juice last night to relieve constipation and had large amounts of diarrhea during the night. Her husband reports she is weak today and was brought in  by EMS. Pt is on chronic anticoagulation for unknown reason-however after speaking with the daughter-she was not taking Lovenox-and does not know why it still appears in her medication list. She also was on Ciprofloxacin in mid May for UTI. She had a knee replacement and infection in 2013. Daughter states that her health has declined since.   In the ED, she appears dehydrated and weak. She has metabolic acidosis with bicarb 13, otherwise stable. CT abd/pelvis w/wo contrast, CXR, and UA, and strep/throat cultures are pending.    Review of Systems    In addition to the HPI above,  No Fever-chills, No problems swallowing food or Liquids, No Chest pain, Cough or Shortness of Breath, No Abdominal pain, No Vomiting.   A full 10 point Review of Systems was done, except as stated above, all other Review of Systems were negative.   Social History History  Substance Use Topics  . Smoking status: Never Smoker   . Smokeless tobacco: Never Used  . Alcohol Use: No    Family History History reviewed. No pertinent family history.   Prior to Admission medications   Medication Sig Start Date End Date Taking? Authorizing Provider  diphenoxylate-atropine (LOMOTIL) 2.5-0.025 MG per tablet Take 1 tablet by mouth 4 (four) times daily as  needed for diarrhea or loose stools.   Yes Historical Provider, MD  triamterene-hydrochlorothiazide (MAXZIDE-25) 37.5-25 MG per tablet Take 0.5 tablets by mouth daily.   Yes Historical Provider, MD  diazepam (VALIUM) 5 MG tablet Take 1 tablet by mouth At bedtime as needed. For sleep 11/29/11   Historical Provider, MD  dorzolamide-timolol (COSOPT) 22.3-6.8 MG/ML ophthalmic solution Place 1 drop into both eyes 2 (two) times daily.     Historical Provider, MD  enoxaparin (LOVENOX) 40 MG/0.4ML injection Inject 0.4 mLs (40 mg total) into the skin daily. 04/29/12   Carlynn Spry, PA-C  gabapentin (NEURONTIN) 300 MG capsule Take 300 mg by mouth at bedtime.     Historical Provider, MD  latanoprost (XALATAN) 0.005 % ophthalmic solution Place 1 drop into both eyes at bedtime.     Historical Provider, MD  levothyroxine (SYNTHROID, LEVOTHROID) 100 MCG tablet Take 100 mcg by mouth daily.     Historical Provider, MD  oxyCODONE (OXYCONTIN) 15 MG TB12 Take 1 tablet (15 mg total) by mouth every 12 (twelve) hours. 04/29/12   Carlynn Spry, PA-C  propranolol (INDERAL) 20 MG tablet Take 20 mg by mouth daily. Takes for migraines.    Historical Provider, MD    Allergies  Allergen Reactions  . Demerol Shortness Of Breath  . Lorazepam Anxiety  . Penicillins Swelling    "in the face"  . Tetracyclines & Related Rash  . Vancomycin Rash    Physical Exam  Vitals  Blood pressure 124/52, pulse 101, temperature 98.2 F (36.8 C), temperature source Oral, resp. rate 25, height 5\' 4"  (1.626 m), weight 58.968 kg (130 lb), SpO2 96.00%.   General:  lying in bed in NAD. Appears frail and thin. Daughter and husband at bedside.    Psych:  Normal affect and insight, Not Suicidal or Homicidal, Awake Alert, Oriented X 3.  Neuro:   No F.N deficits, ALL C.Nerves Intact,  Pt able to sit up in bed with assistance.   ENT:  Ears and Eyes appear Normal, Conjunctivae clear, PERRLA. Dry Oral Mucosa, white exudates in posterior pharynx.    Neck:  Supple Neck, No JVD, no tracheal deviation.  Respiratory:  Symmetrical Chest wall movement, Good air movement bilaterally, CTAB.  Cardiac:  Regular rhythm, mildly tachycardic, No Gallops, Rubs or Murmurs.   Abdomen:  Positive Bowel Sounds, Abdomen Soft. Tenderness appreciated in LLQ.  Non-distended. No masses.   Skin:  No Cyanosis. Erythematous macular rash on posterior neck around the hairline.   Musculoskeletal:  Warm and dry extremities. No lower extremity edema. Extremities appear thin. Grossly moves all four extremities. Prominent spine.    Data Review  CBC  Recent Labs Lab 02/18/14 1213  WBC 19.0*  HGB 14.6  HCT 45.4  PLT 516*  MCV 89.4  MCH 28.7  MCHC 32.2  RDW 14.4  LYMPHSABS 0.6*  MONOABS 1.3*  EOSABS 0.0  BASOSABS 0.0   ------------------------------------------------------------------------------------------------------------------  Chemistries   Recent Labs Lab 02/18/14 1213 02/18/14 1400  NA 135*  --   K 3.1*  --   CL 88*  --   CO2 13*  --   GLUCOSE 126*  --   BUN 22  --   CREATININE 0.89  --   CALCIUM 8.3*  --   MG  --  3.0*  AST 11  --   ALT 6  --   ALKPHOS 110  --   BILITOT 0.7  --    ------------------------------------------------------------------------------------------------------------------    Cardiac Enzymes  Recent Labs Lab 02/18/14 1213  TROPONINI <0.30   ------------------------------------------------------------------------------------------------------------------   ---------------------------------------------------------------------------------------------------------------  Urinalysis    Component Value Date/Time   COLORURINE YELLOW 04/28/2012 [place] 04/28/2012 0757   LABSPEC 1.008 04/28/2012 0757   PHURINE 7.0 04/28/2012 0757   GLUCOSEU NEGATIVE 04/28/2012 0757   HGBUR NEGATIVE 04/28/2012 [place] 04/28/2012 0757   KETONESUR NEGATIVE 04/28/2012 0757   PROTEINUR  NEGATIVE 04/28/2012 0757   UROBILINOGEN 0.2 04/28/2012 0757   NITRITE NEGATIVE 04/28/2012 0757   LEUKOCYTESUR TRACE* 04/28/2012 0757    ----------------------------------------------------------------------------------------------------------------  My personal review of EKG: Rhythm NSR, questionable ST depression in II, III, AVF. Comments include prolonged QTc.     Assessment & Plan  Principal Problem:   Metabolic acidosis Active Problems:   Hypertension   Hypokalemia   Severe diarrhea   Weight loss, unintentional   Rash of neck   Hypothyroidism   Chronic anticoagulation   Tonsillar exudate  Principal Problem: Abdominal Pain, Nausea, Diarrhea with intermittent constipation - h/o chronic narcotic use with severe constipation, relieved by caster oil and prune juice - hypokalemia, on Saline with KCl - CT w/wo contrast Abd/pelvis ordered - Lipase pending. - C-diff pending - Cipro / flagyl.  Metabolic acidosis - uncertain etiology, diarrhea and dehydration - Lactic acid pending, Blood cultures pending. - IVF, repeat BMet 7pm, if no improvement start Bicarb drip  Leukocytosis - ?etiology-suspected Infectious Colitis - UA and CXR ordered - Rapid strep/culture and fungal culture of throat exudate ordered - Given abdominal pain and diarrhea cipro/flagyl to be started after blood cultures are drawn.  Chronic anticoagulation - PCP and family unable to provide reasoning - Had knee surgery 2 years ago.  No h/o dvt or PE. - Lovenox injections being continued until further investigation  Weight Loss - pt family reports 40 lb weight loss in last 4 months - pt denies difficulties swallowing, but reports loss of appetite - CT w/wo contrast Abd/pelvis ordered - nutrition consult requested - Full liquids ordered on admission.  Chronic back pain, on chronic narcotics - h/o previous surgeries, c/w home narcotic regimen - Rx Miralax, prn morphine for break thru pain  Hypothyroidism  s/p thyroidectomy  - c/w Synthroid, TSH pending  HTN  -  stable - HCTZ/triamterene being held - c/w propranolol   Unspecified Rash, back of neck -monitor  DVT Prophylaxis:  Lovenox   AM Labs Ordered, also please review Full Orders  Family Communication:   Husband and daughter at bedside.   Code Status:  DNR  Likely DC to  To be determined, PT consult pending    Condition:  Stable  Time spent in minutes : 60   Martinique Hausladen, PA-S [place], Bobby Rumpf PA-C on 02/18/2014 at 4:01 PM  Between 7am to 7pm - Pager - [phone]  After 7pm go to [email or web address] - password [email or web address]  And look for the night coverage person covering me after hours  [facility] Office  [phone]  Attending Patient seen and examined, admitted with dehydration, diarrhea. Recently had UTI and was taking Cipro. Very dry on exam, belly is benign, CT Abdomen-showing colitis. Suspect C Diff, empiric Vancomycin till further results available.  Rest as above  Nena Alexander MD

## 2014-02-18 NOTE — ED Notes (Signed)
PT to xray

## 2014-02-18 NOTE — ED Provider Notes (Signed)
CSN: [id number]     Arrival date & time 02/18/14  1151 History   First MD Initiated Contact with Patient 02/18/14 1151     Chief Complaint  Patient presents with  . Weakness     HPI Pt presents to ED via EMS from home with complaints of feeling bad for 1 week, diarrhea since last night, weakness today. EMS placed PIV gave 500 NS.Marland Kitchen  Past Medical History  Diagnosis Date  . Angina   . Headache(784.0)   . Hypoglycemia   . Fracture 2008    back  . Macular degeneration of both eyes   . Glaucoma   . Hypothyroidism   . Blood transfusion   . Anemia   . GERD (gastroesophageal reflux disease)   . H/O hiatal hernia   . Arthritis   . Chronic back pain   . Thyroid cancer 1986  . Shortness of breath 11/08/11    "here lately, all the time"  . Exertional dyspnea 01/17/12  . Osteomyelitis 11/2011    right knee  . Hypertension   . Pneumonia 01/2010  . Metabolic acidemia 6/00/4599   Past Surgical History  Procedure Laterality Date  . Thyroidectomy  1986  . Shoulder arthroscopy w/ rotator cuff repair    . Trigger finger release      right  thumb  . Abdominal hysterectomy  1963  . Carpal tunnel release      bilaterally  . Tonsillectomy and adenoidectomy    . Appendectomy    . Dilation and curettage of uterus    . Cataract extraction w/ intraocular lens  implant, bilateral    . Joint replacement  2005, 2006, 2012, 11/10/11    left; left; right; right; right  . Goiter removed  1980's    "when I was young"  . Fracture surgery      "broke back"  . Hand or  09/2011    "left hand; bones were coming together"  . I&d knee with poly exchange  11/10/2011    Procedure: IRRIGATION AND DEBRIDEMENT KNEE WITH POLY EXCHANGE;  Surgeon: Yvette Rack., MD;  Location: [place];  Service: Orthopedics;  Laterality: Right;  . Post polyethelene exchange  11/09/11    RLE  . Back surgery      "3 back OR's;"; S/P "fractured back 2008"  . Peripherally inserted central catheter insertion  11/2011    "removed 12/2011;  LUE"  . Total knee revision  04/28/2012    Procedure: TOTAL KNEE REVISION;  Surgeon: Rudean Haskell, MD;  Location: [place];  Service: Orthopedics;  Laterality: Right;   History reviewed. No pertinent family history. History  Substance Use Topics  . Smoking status: Never Smoker   . Smokeless tobacco: Never Used  . Alcohol Use: No   OB History   Grav Para Term Preterm Abortions TAB SAB Ect Mult Living                 Review of Systems  All other systems reviewed and are negative  Allergies  Demerol; Lorazepam; Penicillins; Tetracyclines & related; and Vancomycin  Home Medications   Prior to Admission medications   Medication Sig Start Date End Date Taking? Authorizing Aamna Mallozzi  diazepam (VALIUM) 5 MG tablet Take 1 tablet by mouth 2 (two) times daily as needed for anxiety or sedation. For sleep 11/29/11  Yes Historical Jillianna Stanek, MD  diphenoxylate-atropine (LOMOTIL) 2.5-0.025 MG per tablet Take 1 tablet by mouth 4 (four) times daily as needed for diarrhea or loose  stools.   Yes Historical Anne Sebring, MD  gabapentin (NEURONTIN) 300 MG capsule Take 300 mg by mouth 3 (three) times daily.    Yes Historical Krystelle Prashad, MD  latanoprost (XALATAN) 0.005 % ophthalmic solution Place 1 drop into both eyes at bedtime.    Yes Historical Niko Penson, MD  levothyroxine (SYNTHROID, LEVOTHROID) 150 MCG tablet Take 150 mcg by mouth daily before breakfast.   Yes Historical Wiley Magan, MD  ondansetron (ZOFRAN) 4 MG tablet Take 4 mg by mouth 2 (two) times daily as needed for nausea or vomiting.   Yes Historical Tiran Sauseda, MD  propranolol (INDERAL) 20 MG tablet Take 20 mg by mouth daily. Takes for migraines.   Yes Historical Billal Rollo, MD  triamterene-hydrochlorothiazide (MAXZIDE-25) 37.5-25 MG per tablet Take 0.5 tablets by mouth daily.   Yes Historical Rahmon Heigl, MD   BP 115/65  Pulse 67  Temp(Src) 98.2 F (36.8 C) (Oral)  Resp 18  Ht 5\' 4"  (1.626 m)  Wt 116 lb 6.4 oz (52.799 kg)  BMI 19.97 kg/m2  SpO2  100% Physical Exam  Nursing note and vitals reviewed. Constitutional: She is oriented to person, place, and time. She appears well-developed and well-nourished. She appears ill. No distress.  HENT:  Head: Normocephalic and atraumatic.  Eyes: Pupils are equal, round, and reactive to light.  Neck: Normal range of motion.  Cardiovascular: Normal rate and intact distal pulses.   Pulmonary/Chest: No respiratory distress. She has no wheezes.  Abdominal: Soft. Normal appearance. She exhibits no distension. There is no tenderness. There is no rebound.  Musculoskeletal: Normal range of motion.  Neurological: She is alert and oriented to person, place, and time. No cranial nerve deficit.  Skin: Skin is warm and dry. No rash noted.  Psychiatric: She has a normal mood and affect. Her behavior is normal.    ED Course  Procedures (including critical care time) Labs Review Labs Reviewed  COMPREHENSIVE METABOLIC PANEL - Abnormal; Notable for the following:    Sodium 135 (*)    Potassium 3.1 (*)    Chloride 88 (*)    CO2 13 (*)    Glucose, Bld 126 (*)    Calcium 8.3 (*)    Albumin 2.8 (*)    GFR calc non Af Amer 57 (*)    GFR calc Af Amer 66 (*)    All other components within normal limits  CBC WITH DIFFERENTIAL - Abnormal; Notable for the following:    WBC 19.0 (*)    Platelets 516 (*)    Neutrophils Relative % 90 (*)    Lymphocytes Relative 3 (*)    Neutro Abs 17.1 (*)    Lymphs Abs 0.6 (*)    Monocytes Absolute 1.3 (*)    All other components within normal limits  MAGNESIUM - Abnormal; Notable for the following:    Magnesium 3.0 (*)    All other components within normal limits  LACTIC ACID, PLASMA - Abnormal; Notable for the following:    Lactic Acid, Venous 7.0 (*)    All other components within normal limits  URINALYSIS, ROUTINE W REFLEX MICROSCOPIC - Abnormal; Notable for the following:    Ketones, ur 40 (*)    All other components within normal limits  LIPASE, BLOOD -  Abnormal; Notable for the following:    Lipase 7 (*)    All other components within normal limits  TSH - Abnormal; Notable for the following:    TSH 0.061 (*)    All other components within normal limits  BASIC METABOLIC  PANEL - Abnormal; Notable for the following:    Sodium 132 (*)    Potassium 2.7 (*)    Chloride 93 (*)    Glucose, Bld 150 (*)    Calcium 7.7 (*)    GFR calc non Af Amer 75 (*)    GFR calc Af Amer 87 (*)    All other components within normal limits  COMPREHENSIVE METABOLIC PANEL - Abnormal; Notable for the following:    Sodium 134 (*)    Potassium 3.4 (*)    Glucose, Bld 130 (*)    Calcium 7.7 (*)    Total Protein 4.5 (*)    Albumin 2.2 (*)    GFR calc non Af Amer 75 (*)    GFR calc Af Amer 87 (*)    All other components within normal limits  CBC - Abnormal; Notable for the following:    RBC 3.55 (*)    Hemoglobin 10.3 (*)    HCT 31.1 (*)    All other components within normal limits  CLOSTRIDIUM DIFFICILE BY PCR  RAPID STREP SCREEN  FUNGUS CULTURE W SMEAR  CULTURE, GROUP A STREP  CULTURE, BLOOD (ROUTINE X 2)  CULTURE, BLOOD (ROUTINE X 2)  STOOL CULTURE  CLOSTRIDIUM DIFFICILE BY PCR  TROPONIN I  GI PATHOGEN PANEL BY PCR, STOOL  BASIC METABOLIC PANEL  CBC    Imaging Review Dg Chest 2 View  02/18/2014   CLINICAL DATA:  80 year old female with nausea vomiting diarrhea and fever. Initial encounter.  EXAM: CHEST  2 VIEW  COMPARISON:  01/17/2012 and earlier.  FINDINGS: Lung volumes are within normal limits. Normal cardiac size and mediastinal contours. Visualized tracheal air column is within normal limits. No pneumothorax, pulmonary edema, pleural effusion or confluent pulmonary opacity. Grossly negative visible bowel gas pattern. Partially visible lumbar fusion hardware. No acute osseous abnormality identified.  IMPRESSION: No acute cardiopulmonary abnormality.   Electronically Signed   By: Lars Pinks M.D.   On: 02/18/2014 15:19   Ct Abdomen Pelvis W  Contrast  02/18/2014   CLINICAL DATA:  Nausea, weight loss and intermittent constipation and diarrhea.  EXAM: CT ABDOMEN AND PELVIS WITH CONTRAST  TECHNIQUE: Multidetector CT imaging of the abdomen and pelvis was performed using the standard protocol following bolus administration of intravenous contrast.  CONTRAST:  87mL OMNIPAQUE IOHEXOL 300 MG/ML  SOLN  COMPARISON:  None.  FINDINGS: The lung bases are clear. No pleural effusion or pulmonary nodule. The heart is normal in size. No pericardial effusion. The distal esophagus is grossly normal.  Numerous small low-attenuation lesions in the liver consistent with benign hepatic cysts. No worrisome hepatic lesions or intrahepatic biliary dilatation. The gallbladder is normal. Mild age related common bile duct dilatation. The pancreas is moderately atrophied but no mass or inflammation. The spleen is normal in size. No focal lesions. The adrenal glands and kidneys are unremarkable. There are small scattered cysts and scarring changes with areas of focal cortical atrophy. No acute abnormality or mass. No hydronephrosis.  The stomach is not well distended but no gross abnormalities are seen. The duodenum and small bowel are unremarkable.  There is diffuse colonic wall thickening also involving the rectum. Suggesting a diffuse colitis. C difficile colitis would be the most likely cause.  No mesenteric or retroperitoneal mass or adenopathy. Moderate atherosclerotic calcifications involving the aorta and iliac arteries but no focal aneurysm or dissection.  The bladder is distended. No bladder wall thickening or mass. The uterus is surgically absent. No pelvic mass  or adenopathy. No significant free pelvic fluid collections. No inguinal mass or adenopathy.  The bony structures are unremarkable. Extensive lumbar fusion hardware is noted.  IMPRESSION: Diffuse colonic wall thickening most likely due to C difficile colitis.  Moderate bladder distention.   Electronically Signed    By: Kalman Jewels M.D.   On: 02/18/2014 16:08     EKG Interpretation   Date/Time:  Thursday February 18 2014 12:04:06 EDT Ventricular Rate:  102 PR Interval:  150 QRS Duration: 89 QT Interval:  389 QTC Calculation: 507 R Axis:   58 Text Interpretation:  Sinus tachycardia Repol abnrm suggests ischemia,  diffuse leads Prolonged QT interval Confirmed by Audie Pinto  MD, ROBERT  ([id number]) on 02/18/2014 12:18:52 PM      MDM   Final diagnoses:  Severe diarrhea  Metabolic acidosis, increased anion gap  Hypokalemia        Dot Lanes, MD 02/19/14 2314

## 2014-02-18 NOTE — Progress Notes (Signed)
CRITICAL VALUE ALERT  Critical value received:  k-2.7  Date of notification:  02/18/14  Time of notification:  2124  Critical value read back:yes  Nurse who received alert:  Larose Kells, RN  MD notified (1st page):  Lamar Blinks, NP  Time of first page:  2121  MD notified (2nd page):  Time of second page:  Responding MD:  Lamar Blinks, NP  Time MD responded:  2200  Pt ordered 5 runs of K

## 2014-02-18 NOTE — ED Notes (Signed)
Pt presents to ED via EMS from home with complaints of feeling bad for 1 week, diarrhea since last night, weakness today. EMS placed PIV gave 500 NS.Marland Kitchen

## 2014-02-19 DIAGNOSIS — K5289 Other specified noninfective gastroenteritis and colitis: Secondary | ICD-10-CM

## 2014-02-19 DIAGNOSIS — E876 Hypokalemia: Secondary | ICD-10-CM | POA: Diagnosis not present

## 2014-02-19 DIAGNOSIS — I10 Essential (primary) hypertension: Secondary | ICD-10-CM | POA: Diagnosis not present

## 2014-02-19 DIAGNOSIS — E872 Acidosis: Secondary | ICD-10-CM | POA: Diagnosis not present

## 2014-02-19 DIAGNOSIS — E440 Moderate protein-calorie malnutrition: Secondary | ICD-10-CM | POA: Insufficient documentation

## 2014-02-19 LAB — COMPREHENSIVE METABOLIC PANEL
ALK PHOS: 75 U/L (ref 39–117)
ALT: <5 U/L (ref 0–35)
AST: 10 U/L (ref 0–37)
Albumin: 2.2 g/dL — ABNORMAL LOW (ref 3.5–5.2)
BILIRUBIN TOTAL: 0.4 mg/dL (ref 0.3–1.2)
BUN: 19 mg/dL (ref 6–23)
CO2: 23 mEq/L (ref 19–32)
Calcium: 7.7 mg/dL — ABNORMAL LOW (ref 8.4–10.5)
Chloride: 99 mEq/L (ref 96–112)
Creatinine, Ser: 0.73 mg/dL (ref 0.50–1.10)
GFR, EST AFRICAN AMERICAN: 87 mL/min — AB (ref >90)
GFR, EST NON AFRICAN AMERICAN: 75 mL/min — AB (ref >90)
GLUCOSE: 130 mg/dL — AB (ref 70–99)
POTASSIUM: 3.4 meq/L — AB (ref 3.7–5.3)
Sodium: 134 mEq/L — ABNORMAL LOW (ref 137–147)
Total Protein: 4.5 g/dL — ABNORMAL LOW (ref 6.0–8.3)

## 2014-02-19 LAB — CBC
HCT: 31.1 % — ABNORMAL LOW (ref 36.0–46.0)
Hemoglobin: 10.3 g/dL — ABNORMAL LOW (ref 12.0–15.0)
MCH: 29 pg (ref 26.0–34.0)
MCHC: 33.1 g/dL (ref 30.0–36.0)
MCV: 87.6 fL (ref 78.0–100.0)
Platelets: 368 10*3/uL (ref 150–400)
RBC: 3.55 MIL/uL — ABNORMAL LOW (ref 3.87–5.11)
RDW: 14.6 % (ref 11.5–15.5)
WBC: 9.4 10*3/uL (ref 4.0–10.5)

## 2014-02-19 LAB — CLOSTRIDIUM DIFFICILE BY PCR: CDIFFPCR: NEGATIVE

## 2014-02-19 MED ORDER — ENOXAPARIN SODIUM 40 MG/0.4ML ~~LOC~~ SOLN
40.0000 mg | SUBCUTANEOUS | Status: DC
  Administered 2014-02-19: 40 mg via SUBCUTANEOUS
  Filled 2014-02-19 (×2): qty 0.4

## 2014-02-19 MED ORDER — BOOST PLUS PO LIQD
237.0000 mL | ORAL | Status: DC
  Administered 2014-02-19: 237 mL via ORAL
  Filled 2014-02-19 (×3): qty 237

## 2014-02-19 MED ORDER — POTASSIUM CHLORIDE CRYS ER 20 MEQ PO TBCR
40.0000 meq | EXTENDED_RELEASE_TABLET | Freq: Once | ORAL | Status: AC
  Administered 2014-02-19: 40 meq via ORAL
  Filled 2014-02-19: qty 2

## 2014-02-19 MED ORDER — LEVOTHYROXINE SODIUM 125 MCG PO TABS
125.0000 ug | ORAL_TABLET | Freq: Every day | ORAL | Status: DC
  Administered 2014-02-20: 125 ug via ORAL
  Filled 2014-02-19 (×2): qty 1

## 2014-02-19 MED ORDER — METRONIDAZOLE 500 MG PO TABS
500.0000 mg | ORAL_TABLET | Freq: Three times a day (TID) | ORAL | Status: DC
  Administered 2014-02-19 – 2014-02-20 (×3): 500 mg via ORAL
  Filled 2014-02-19 (×7): qty 1

## 2014-02-19 MED ORDER — SENNOSIDES-DOCUSATE SODIUM 8.6-50 MG PO TABS
1.0000 | ORAL_TABLET | Freq: Every day | ORAL | Status: DC
  Administered 2014-02-19: 1 via ORAL
  Filled 2014-02-19: qty 1

## 2014-02-19 MED ORDER — CIPROFLOXACIN HCL 500 MG PO TABS
500.0000 mg | ORAL_TABLET | Freq: Two times a day (BID) | ORAL | Status: DC
  Administered 2014-02-19 – 2014-02-20 (×2): 500 mg via ORAL
  Filled 2014-02-19 (×4): qty 1

## 2014-02-19 MED ORDER — PANTOPRAZOLE SODIUM 40 MG PO TBEC
40.0000 mg | DELAYED_RELEASE_TABLET | Freq: Every day | ORAL | Status: DC
  Administered 2014-02-19 – 2014-02-20 (×2): 40 mg via ORAL
  Filled 2014-02-19 (×2): qty 1

## 2014-02-19 NOTE — Progress Notes (Addendum)
PROGRESS NOTE  LISAMARIE COKE XMI:[id number] DOB: 09-03-1925 DOA: 02/18/2014 PCP: Merrilee Seashore, MD  HPI/Subjective:  [name] is a 80 y.o. female, presents today after an episode of diarrhea. Family at bedside reports 4 month history of nausea, 40 lb weight loss, intermittent constipation and diarrhea. Denies any vomiting. Pt has h/o multiple back surgeries and chronic back pain for which she receives narcotics. She took [employer] and prune juice last night to relieve constipation and had large amounts of diarrhea during the night. Her husband reports she is weak today and was brought in by EMS. Pt is on chronic anticoagulation for unknown reason-however after speaking with the daughter-she was not taking Lovenox-and does not know why it still appears in her medication list. She also was on Ciprofloxacin in mid May for UTI. She had a knee replacement and infection in 2013. Daughter states that her health has declined since.   In the ED, she appears dehydrated and weak. She has metabolic acidosis with bicarb 13, otherwise stable.    Assessment/Plan:  Colitis -suspect infectious, C Diff PCR neg. Await further stool studies. Admitted and started on IVF, IV Cipro and Flagyl. Significantly improved, diarrhea has slowed down significantly.  - CT w/wo contrast Abd/pelvis revealed diffuse colonic wall thickening, suspicious for C diff colitis - C-diff negative, GI panel still pending.  - C-diff negative, GI panel still pending.  - c/w  Cipro / flagyl -day 2  Metabolic acidosis  - uncertain etiology, but suspect due to diarrhea and dehydration  - Resolved with IVF.  Leukocytosis  - resolved.  Colitis vs severe constipation causing bowel inflammation. - UA and CXR unremarkable for infection - Rapid strep negative, throat cultures pending - blood cultures drawn-pending - c/w cipro/flagyl-day 2  Weight Loss in context of severe malnutrition - pt family reports 40 lb weight loss in  last 4 months  - stable for outpatient work up.  May be partially related to hyperthyroidism (TSH suppressed), and anorexia due to constipation. - pt denies difficulties swallowing, but reports loss of appetite  - nutrition consult requested  - nutrition consult recommended advance to solids d/t severe malnutrition  Chronic back pain, on chronic narcotics  - h/o previous surgeries, c/w home narcotic regimen  - Rx Miralax, prn morphine for break thru pain   Hypothyroidism s/p thyroidectomy  - c/w Synthroid, TSH is low (0.061)  - will decrease synthroid dose from 150 mcg to 125 mcg-recheck TSH in 3 months  HTN  - stable  - HCTZ/triamterene being held  - c/w propranolol   Unspecified Rash, back of neck  -monitor  DVT Prophylaxis:  Lovenox  Code Status: DNR  Family Communication: daughter and husband at bedside  Disposition Plan: Inpatient.  Potential discharge 6/20.  Consultants:  Nutrition, Erlene Quan, RD  Procedures:  None  Antibiotics: Anti-infectives   Start     Dose/Rate Route Frequency Ordered Stop   02/18/14 1815  vancomycin (VANCOCIN) 50 mg/mL oral solution 125 mg  Status:  Discontinued     125 mg Oral 4 times daily 02/18/14 1812 02/18/14 1813   02/18/14 1815  ciprofloxacin (CIPRO) IVPB 400 mg     400 mg 200 mL/hr over 60 Minutes Intravenous Every 12 hours 02/18/14 1814     02/18/14 1815  metroNIDAZOLE (FLAGYL) IVPB 500 mg     500 mg 100 mL/hr over 60 Minutes Intravenous Every 8 hours 02/18/14 1814     02/18/14 1600  ciprofloxacin (CIPRO) IVPB 400 mg  Status:  Discontinued    Comments:  Start after blood cultures are drawn   400 mg 200 mL/hr over 60 Minutes Intravenous Every 12 hours 02/18/14 1552 02/18/14 1811   02/18/14 1600  metroNIDAZOLE (FLAGYL) IVPB 500 mg  Status:  Discontinued    Comments:  Start after blood cultures are drawn   500 mg 100 mL/hr over 60 Minutes Intravenous Every 8 hours 02/18/14 1552 02/18/14 1811        Objective: Filed Vitals:   02/18/14 1345 02/18/14 1646 02/18/14 2226 02/19/14 0534  BP: 124/52 116/67 111/62 106/60  Pulse: 101 94 84 76  Temp:  96.5 F (35.8 C) 97.6 F (36.4 C) 97.6 F (36.4 C)  TempSrc:  Axillary Oral Oral  Resp: 25 18 18 17   Height:  5\' 4"  (1.626 m)    Weight:  52.799 kg (116 lb 6.4 oz)    SpO2: 96% 100% 99% 100%    Intake/Output Summary (Last 24 hours) at 02/19/14 1210 Last data filed at 02/19/14 0900  Gross per 24 hour  Intake    120 ml  Output      0 ml  Net    120 ml   Filed Weights   02/18/14 1153 02/18/14 1646  Weight: 58.968 kg (130 lb) 52.799 kg (116 lb 6.4 oz)    Exam: General: Well developed. NAD, appears stated age. Appears frail and thin, but less weak than yesterday. Daughter at bedside.     HEENT:  EOMI, Anicteic Sclera, oral mucosa dry.  No tracheal deviation or JVD.  Cardiovascular: Regular rhythm, mild tachycardia. S1 S2 auscultated, no rubs, murmurs or gallops.   Respiratory: Clear to auscultation bilaterally with equal chest rise. Normal respiratory effort.  Abdomen: Soft, nondistended, + bowel sounds. Tender in LLQ. No organomegaly.  Extremities: warm dry without cyanosis clubbing or edema.  Neuro: AAOx3, cranial nerves grossly intact. Pt able to sit up in bed without assistance. Grossly moves all four extremities with coordination.  Skin: No cyanosis. Erythematous macular rash on posterior neck unchanged from admission.  Psych: Pleasant and appropriate affect and demeanor with intact judgement and insight.    Data Reviewed: Basic Metabolic Panel:  Recent Labs Lab 02/18/14 1213 02/18/14 1400 02/18/14 2006 02/19/14 0701  NA 135*  --  132* 134*  K 3.1*  --  2.7* 3.4*  CL 88*  --  93* 99  CO2 13*  --  20 23  GLUCOSE 126*  --  150* 130*  BUN 22  --  22 19  CREATININE 0.89  --  0.74 0.73  CALCIUM 8.3*  --  7.7* 7.7*  MG  --  3.0*  --   --    Liver Function Tests:  Recent Labs Lab 02/18/14 1213 02/19/14 0701   AST 11 10  ALT 6 <5  ALKPHOS 110 75  BILITOT 0.7 0.4  PROT 6.2 4.5*  ALBUMIN 2.8* 2.2*    Recent Labs Lab 02/18/14 1709  LIPASE 7*   CBC:  Recent Labs Lab 02/18/14 1213 02/19/14 0701  WBC 19.0* 9.4  NEUTROABS 17.1*  --   HGB 14.6 10.3*  HCT 45.4 31.1*  MCV 89.4 87.6  PLT 516* 368   Cardiac Enzymes:  Recent Labs Lab 02/18/14 1213  TROPONINI <0.30    Recent Results (from the past 240 hour(s))  RAPID STREP SCREEN     Status: None   Collection Time    02/18/14  5:26 PM      Result Value Ref Range Status  Streptococcus, Group A Screen (Direct) NEGATIVE  NEGATIVE Final   Comment: (NOTE)     A Rapid Antigen test may result negative if the antigen level in the     sample is below the detection level of this test. The FDA has not     cleared this test as a stand-alone test therefore the rapid antigen     negative result has reflexed to a Group A Strep culture.  FUNGUS CULTURE W SMEAR     Status: None   Collection Time    02/18/14  5:26 PM      Result Value Ref Range Status   Specimen Description THROAT   Final   Special Requests Immunocompromised   Final   Fungal Smear     Final   Value: FEW Y     Performed at [facility]   Culture     Final   Value: Culture in Progress for 7 days     Performed at [facility]   Report Status PENDING   Incomplete  CLOSTRIDIUM DIFFICILE BY PCR     Status: None   Collection Time    02/19/14  7:38 AM      Result Value Ref Range Status   C difficile by pcr NEGATIVE  NEGATIVE Final     Studies: Dg Chest 2 View  02/18/2014   CLINICAL DATA:  80 year old female with nausea vomiting diarrhea and fever. Initial encounter.  EXAM: CHEST  2 VIEW  COMPARISON:  01/17/2012 and earlier.  FINDINGS: Lung volumes are within normal limits. Normal cardiac size and mediastinal contours. Visualized tracheal air column is within normal limits. No pneumothorax, pulmonary edema, pleural effusion or confluent pulmonary opacity.  Grossly negative visible bowel gas pattern. Partially visible lumbar fusion hardware. No acute osseous abnormality identified.  IMPRESSION: No acute cardiopulmonary abnormality.   Electronically Signed   By: Lars Pinks M.D.   On: 02/18/2014 15:19   Ct Abdomen Pelvis W Contrast  02/18/2014   CLINICAL DATA:  Nausea, weight loss and intermittent constipation and diarrhea.  EXAM: CT ABDOMEN AND PELVIS WITH CONTRAST  TECHNIQUE: Multidetector CT imaging of the abdomen and pelvis was performed using the standard protocol following bolus administration of intravenous contrast.  CONTRAST:  54mL OMNIPAQUE IOHEXOL 300 MG/ML  SOLN  COMPARISON:  None.  FINDINGS: The lung bases are clear. No pleural effusion or pulmonary nodule. The heart is normal in size. No pericardial effusion. The distal esophagus is grossly normal.  Numerous small low-attenuation lesions in the liver consistent with benign hepatic cysts. No worrisome hepatic lesions or intrahepatic biliary dilatation. The gallbladder is normal. Mild age related common bile duct dilatation. The pancreas is moderately atrophied but no mass or inflammation. The spleen is normal in size. No focal lesions. The adrenal glands and kidneys are unremarkable. There are small scattered cysts and scarring changes with areas of focal cortical atrophy. No acute abnormality or mass. No hydronephrosis.  The stomach is not well distended but no gross abnormalities are seen. The duodenum and small bowel are unremarkable.  There is diffuse colonic wall thickening also involving the rectum. Suggesting a diffuse colitis. C difficile colitis would be the most likely cause.  No mesenteric or retroperitoneal mass or adenopathy. Moderate atherosclerotic calcifications involving the aorta and iliac arteries but no focal aneurysm or dissection.  The bladder is distended. No bladder wall thickening or mass. The uterus is surgically absent. No pelvic mass or adenopathy. No significant free pelvic  fluid collections.  No inguinal mass or adenopathy.  The bony structures are unremarkable. Extensive lumbar fusion hardware is noted.  IMPRESSION: Diffuse colonic wall thickening most likely due to C difficile colitis.  Moderate bladder distention.   Electronically Signed   By: Kalman Jewels M.D.   On: 02/18/2014 16:08    Scheduled Meds: . ciprofloxacin  400 mg Intravenous Q12H  . dorzolamide-timolol  1 drop Both Eyes BID  . enoxaparin  40 mg Subcutaneous QHS  . gabapentin  300 mg Oral QHS  . lactose free nutrition  237 mL Oral Q24H  . latanoprost  1 drop Both Eyes QHS  . levothyroxine  150 mcg Oral QAC breakfast  . metronidazole  500 mg Intravenous Q8H  . OxyCODONE  15 mg Oral Q12H  . pantoprazole (PROTONIX) IV  40 mg Intravenous Q24H  . polyethylene glycol  17 g Oral Daily  . propranolol  20 mg Oral Daily  . sodium chloride  3 mL Intravenous Q12H   Continuous Infusions: . sodium chloride 0.9 % 1,000 mL with potassium chloride 40 mEq infusion 150 mL/hr at 02/18/14 1832    Principal Problem:   Metabolic acidosis Active Problems:   Hypertension   Hypokalemia   Severe diarrhea   Weight loss, unintentional   Rash of neck   Hypothyroidism   Tonsillar exudate   Signed, Martinique Hausladen, PA-S Imogene Burn, PA-C [facility] Pager [phone]. If 7PM-7AM, please contact night-coverage at [email or web address], password [facility] 02/19/2014, 12:10 PM  LOS: 1 day   Attending Patient was seen, examined,treatment plan was discussed with the Physician extender. I have directly reviewed the clinical findings, lab, imaging studies and management of this patient in detail. I have made the necessary changes to the above noted documentation, and agree with the documentation, as recorded by the Physician extender.  Nena Alexander MD [facility].

## 2014-02-19 NOTE — Evaluation (Signed)
Physical Therapy Evaluation Patient Details Name: [name] MRN: [id number] DOB: April 09, 1925 Today's Date: 02/19/2014   History of Present Illness  Pt. Admitted 02/18/14  with diarrhea ,abdominal pain, dehydration and weakness.  Pt. with h/o 4 months of nausea, 40# weight loss, intermittent constipation.  Principal problems currently include colitis, suspect infectious but PCR (-), severe malnutrition and chronic LBP  Clinical Impression  Pt. Presents to PT with overall generalized weakness/deconditioning from illness, though no focal strength deficits noted on MMT.  She will benefit from acute PT to address these and below issues to allow safe return home with her spouse at time of DC.    Follow Up Recommendations Supervision - Intermittent;No PT follow up    Equipment Recommendations  None recommended by PT    Recommendations for Other Services       Precautions / Restrictions Precautions Precautions: Fall Restrictions Weight Bearing Restrictions: No      Mobility  Bed Mobility Overal bed mobility:  (not tested, in recliner upon PT arrival)                Transfers Overall transfer level: Needs assistance Equipment used: Rolling walker (2 wheeled) Transfers: Sit to/from Stand Sit to Stand: Modified independent (Device/Increase time);Supervision         General transfer comment: mod I to rise to stand from recliner; supervision to lower self to low toilet  Ambulation/Gait Ambulation/Gait assistance: Supervision Ambulation Distance (Feet): 80 Feet Assistive device: Rolling walker (2 wheeled) Gait Pattern/deviations: Step-through pattern;Decreased step length - right;Decreased step length - left;Trunk flexed;Narrow base of support Gait velocity: decreased   General Gait Details: Pt. able to maneuver RW well, tends to be forwardly flexed in walking but no overt LOB noted.  Stairs            Wheelchair Mobility    Modified Rankin (Stroke Patients  Only)       Balance Overall balance assessment: Modified Independent (with assist of RW)                                           Pertinent Vitals/Pain See vitals tab No pain, no distress    Home Living Family/patient expects to be discharged to:: Private residence Living Arrangements: Spouse/significant other Available Help at Discharge: Available 24 hours/day Type of Home: House Home Access: Stairs to enter Entrance Stairs-Rails: Right;Left;Can reach both Entrance Stairs-Number of Steps: 3 Home Layout: Two level;Able to live on main level with bedroom/bathroom Home Equipment: Shower seat - built in;Walker - 4 wheels;Cane - single point;Bedside commode;Hand held shower head;Walker - 2 wheels      Prior Function Level of Independence: Independent with assistive device(s)               Hand Dominance   Dominant Hand: Right    Extremity/Trunk Assessment   Upper Extremity Assessment: Defer to OT evaluation           Lower Extremity Assessment: Overall WFL for tasks assessed (good strenght to MMT'ing)      Cervical / Trunk Assessment: Kyphotic  Communication   Communication: No difficulties  Cognition Arousal/Alertness: Awake/alert Behavior During Therapy: WFL for tasks assessed/performed Overall Cognitive Status: Within Functional Limits for tasks assessed                      General Comments      Exercises  Assessment/Plan    PT Assessment Patient needs continued PT services  PT Diagnosis Difficulty walking;Generalized weakness   PT Problem List Decreased strength;Decreased activity tolerance;Decreased balance;Decreased mobility  PT Treatment Interventions DME instruction;Gait training;Stair training;Functional mobility training;Therapeutic activities;Therapeutic exercise;Balance training;Patient/family education   PT Goals (Current goals can be found in the Care Plan section) Acute Rehab PT Goals Patient  Stated Goal: home, get stronger PT Goal Formulation: With patient Time For Goal Achievement: 02/26/14 Potential to Achieve Goals: Good    Frequency Min 3X/week   Barriers to discharge        Co-evaluation PT/OT/SLP Co-Evaluation/Treatment: Yes Reason for Co-Treatment: Other (comment) (pt.'s fatiguability due to generalized weakness)           End of Session Equipment Utilized During Treatment: Gait belt Activity Tolerance: Patient tolerated treatment well Patient left: Other (comment) (pt. left in bathroom with OT in room to complete evaluation) Nurse Communication: Mobility status         Time: 2863-8177 PT Time Calculation (min): 20 min   Charges:   PT Evaluation $Initial PT Evaluation Tier I: 1 Procedure PT Treatments $Gait Training: 8-22 mins   PT G CodesLadona Ridgel 02/19/2014, 3:58 PM Gerlean Ren PT Acute Rehab Services [place] [phone]

## 2014-02-19 NOTE — Progress Notes (Signed)
PHARMACIST - PHYSICIAN COMMUNICATION DR:   Sloan Leiter CONCERNING: Protonix IV to Oral Route Change Policy  RECOMMENDATION: This patient is receiving Protonix by the intravenous route.  Based on criteria approved by the Pharmacy and Therapeutics Committee, this drug is being converted to the equivalent oral dose form(s).  DESCRIPTION: These criteria include:  The patient is eating (either orally or via tube) and/or has been taking other orally administered medications for a least 24 hours  There is no active GI bleed or impaired GI absorption noted.   If you have questions about this conversion, please contact the Pharmacy Department  []   [phone] )  [facility] [x]   [phone] )  [facility]  []   [phone] )  [facility] []   [phone] )  [place], PharmD, BCPS Clinical Pharmacist Pager: [phone] 02/19/2014 12:49 PM  g

## 2014-02-19 NOTE — Progress Notes (Addendum)
INITIAL NUTRITION ASSESSMENT  DOCUMENTATION CODES Per approved criteria  -Severe malnutrition in the context of chronic illness   INTERVENTION: RD provided diet education to patient and family regarding constipation during this visit. Add Boost Plus po daily, each supplement provides 360 kcal and 14 grams of protein. Note only "Rich Chocolate" flavor available. Recommend diet advancement - discussed with Dr. Sloan Leiter. RD to continue to follow nutrition care plan.  NUTRITION DIAGNOSIS: Inadequate oral intake related to altered GI function as evidenced by pt report and ongoing weight loss.   Goal: Further diet advancement. Intake to meet >90% of estimated nutrition needs.  Monitor:  weight trends, lab trends, I/O's, PO intake, supplement tolerance, diet advancement  Reason for Assessment: MD Consult for Nutrition Assessment  80 y.o. female  Admitting Dx: Metabolic acidosis  ASSESSMENT: PMHx significant for macular degeneration, GERD, thyroid CA, HTN. Admitted with 46-month hx of n/d/c and 40-lb weight loss. Work-up reveals metabolic acidosis.  Pt reports that during these past 4 months, she has not been able to eat well 2/2 constipation issues. Family reports that she does not drink water, or much of any fluids, throughout the day. Discussed pt during progression rounds. Pt has had IVF throughout the night and staff and family report that patient looks much improved. Reiterated with patient need for hydration at home. Pt consumed her liquids for breakfast and is ready for solids - discussed with Dr. Sloan Leiter.  Nutrition Focused Physical Exam:  Subcutaneous Fat:  Orbital Region: moderate depletion Upper Arm Region: severe depletion Thoracic and Lumbar Region: severe depletion  Muscle:  Temple Region: moderate depletion Clavicle Bone Region: severe depletion Clavicle and Acromion Bone Region: severe depletion Scapular Bone Region: n/a Dorsal Hand: n/a Patellar Region: severe  depletion Anterior Thigh Region: severe depletion Posterior Calf Region: severe depletion  Edema: none  Pt meets criteria for severe MALNUTRITION in the context of chronic illness as evidenced by severe fat and muscle mass loss, 20% wt loss x 3-4 months and intake of <75% x at least 1 month.  Pt is agreeable to chocolate Boost, RD to order.  Sodium low at 134 Potassium low at 3.4 --> ordered for KCl Magnesium elevated at 3.0  Height: Ht Readings from Last 1 Encounters:  02/18/14 5\' 4"  (1.626 m)    Weight: Wt Readings from Last 1 Encounters:  02/18/14 116 lb 6.4 oz (52.799 kg)    Ideal Body Weight: 120 lb  % Ideal Body Weight: 97%  Wt Readings from Last 10 Encounters:  02/18/14 116 lb 6.4 oz (52.799 kg)  04/28/12 145 lb 7.6 oz (65.988 kg)  04/28/12 145 lb 7.6 oz (65.988 kg)  04/23/12 145 lb 8 oz (65.998 kg)  02/25/12 135 lb (61.236 kg)  01/18/12 168 lb 14 oz (76.6 kg)  01/18/12 168 lb 14 oz (76.6 kg)  12/19/11 160 lb 0.9 oz (72.6 kg)  12/18/11 160 lb (72.576 kg)  12/18/11 166 lb (75.297 kg)    Usual Body Weight: 145 lb  % Usual Body Weight: 80%  BMI:  Body mass index is 19.97 kg/(m^2). WNL  Estimated Nutritional Needs: Kcal: 1400 - 1600 Protein: 55 - 65 g Fluid: approx 1.5 liters  Skin: stage II R, L and mid buttocks  Diet Order: Full Liquid  EDUCATION NEEDS: -No education needs identified at this time   Intake/Output Summary (Last 24 hours) at 02/19/14 0900 Last data filed at 02/18/14 1643  Gross per 24 hour  Intake      0 ml  Output  0 ml  Net      0 ml    Last BM: 6/18   Labs:   Recent Labs Lab 02/18/14 1213 02/18/14 1400 02/18/14 2006 02/19/14 0701  NA 135*  --  132* 134*  K 3.1*  --  2.7* 3.4*  CL 88*  --  93* 99  CO2 13*  --  20 23  BUN 22  --  22 19  CREATININE 0.89  --  0.74 0.73  CALCIUM 8.3*  --  7.7* 7.7*  MG  --  3.0*  --   --   GLUCOSE 126*  --  150* 130*    CBG (last 3)  No results found for this basename:  GLUCAP,  in the last 72 hours  Scheduled Meds: . ciprofloxacin  400 mg Intravenous Q12H  . dorzolamide-timolol  1 drop Both Eyes BID  . enoxaparin  40 mg Subcutaneous QHS  . gabapentin  300 mg Oral QHS  . latanoprost  1 drop Both Eyes QHS  . levothyroxine  150 mcg Oral QAC breakfast  . metronidazole  500 mg Intravenous Q8H  . OxyCODONE  15 mg Oral Q12H  . pantoprazole (PROTONIX) IV  40 mg Intravenous Q24H  . polyethylene glycol  17 g Oral Daily  . potassium chloride  40 mEq Oral Once  . propranolol  20 mg Oral Daily  . sodium chloride  3 mL Intravenous Q12H    Continuous Infusions: . sodium chloride 0.9 % 1,000 mL with potassium chloride 40 mEq infusion 150 mL/hr at 02/18/14 1832    Past Medical History  Diagnosis Date  . Angina   . Headache(784.0)   . Hypoglycemia   . Fracture 2008    back  . Macular degeneration of both eyes   . Glaucoma   . Hypothyroidism   . Blood transfusion   . Anemia   . GERD (gastroesophageal reflux disease)   . H/O hiatal hernia   . Arthritis   . Chronic back pain   . Thyroid cancer 1986  . Shortness of breath 11/08/11    "here lately, all the time"  . Exertional dyspnea 01/17/12  . Osteomyelitis 11/2011    right knee  . Hypertension   . Pneumonia 01/2010  . Metabolic acidemia 12/04/4740    Past Surgical History  Procedure Laterality Date  . Thyroidectomy  1986  . Shoulder arthroscopy w/ rotator cuff repair    . Trigger finger release      right  thumb  . Abdominal hysterectomy  1963  . Carpal tunnel release      bilaterally  . Tonsillectomy and adenoidectomy    . Appendectomy    . Dilation and curettage of uterus    . Cataract extraction w/ intraocular lens  implant, bilateral    . Joint replacement  2005, 2006, 2012, 11/10/11    left; left; right; right; right  . Goiter removed  1980's    "when I was young"  . Fracture surgery      "broke back"  . Hand or  09/2011    "left hand; bones were coming together"  . I&d knee with poly  exchange  11/10/2011    Procedure: IRRIGATION AND DEBRIDEMENT KNEE WITH POLY EXCHANGE;  Surgeon: Yvette Rack., MD;  Location: [place];  Service: Orthopedics;  Laterality: Right;  . Post polyethelene exchange  11/09/11    RLE  . Back surgery      "3 back OR's;"; S/P "fractured back 2008"  .  Peripherally inserted central catheter insertion  11/2011    "removed 12/2011; LUE"  . Total knee revision  04/28/2012    Procedure: TOTAL KNEE REVISION;  Surgeon: Rudean Haskell, MD;  Location: [place];  Service: Orthopedics;  Laterality: Right;    Inda Coke MS, RD, LDN Inpatient Registered Dietitian Pager: [phone] After-hours pager: [phone]

## 2014-02-19 NOTE — Progress Notes (Signed)
UR completed. Alesia Shavis RN CCM Case Mgmt phone [phone] 

## 2014-02-19 NOTE — Progress Notes (Signed)
Occupational Therapy Evaluation and Discharge Patient Details Name: [name] MRN: [id number] DOB: June 26, 1925 Today's Date: 02/19/2014    History of Present Illness Pt. admitted with diarrhea ,abdominal pain, dehydration and weakness.  Pt. with h/o 4 months of nausea, 40# weight loss, intermittent constipation.  Principal problems currently include colitis, suspect infectious but PCR (-), severe malnutrition and chronic LBP   Clinical Impression   PTA pt was mod independent with ADLs and functional mobility with use of RW (4 and 2 wheeled). Pt is currently at mod Independent level for ADLs and education and training completed with pt on energy conservation and fall prevention. No further acute OT needs. Acute OT to sign off.     Follow Up Recommendations  No OT follow up;Supervision - Intermittent    Equipment Recommendations  None recommended by OT       Precautions / Restrictions Precautions Precautions: Fall Restrictions Weight Bearing Restrictions: No      Mobility Bed Mobility Overal bed mobility:  (not tested, in recliner upon PT arrival)             General bed mobility comments: Not assessed, pt in recliner before/after therapy session.  Transfers Overall transfer level: Modified independent Equipment used: Rolling walker (2 wheeled) Transfers: Sit to/from Stand Sit to Stand: Modified independent (Device/Increase time);Supervision         General transfer comment: Pt required supervision to lower self to low toilet, however pt has BSC at home. Encouraged pt to use this for mod independence at home.     Balance Overall balance assessment: Modified Independent                                          ADL Overall ADL's : Modified independent                                       General ADL Comments: Pt slow to move however able to transfer on/off toilet, perform grooming activities at sink, and ambulate around room  to gather items. Educated pt on fall prevention and energy conservation strategies.      Vision    Pt reports no change from baseline.                 Perception Perception Perception Tested?: No   Praxis Praxis Praxis tested?: Within functional limits    Pertinent Vitals/Pain NAD     Hand Dominance Right   Extremity/Trunk Assessment Upper Extremity Assessment Upper Extremity Assessment: Overall WFL for tasks assessed (good MMT strength)   Lower Extremity Assessment Lower Extremity Assessment: Overall WFL for tasks assessed   Cervical / Trunk Assessment Cervical / Trunk Assessment: Kyphotic   Communication Communication Communication: No difficulties   Cognition Arousal/Alertness: Awake/alert Behavior During Therapy: WFL for tasks assessed/performed Overall Cognitive Status: Within Functional Limits for tasks assessed                                Home Living Family/patient expects to be discharged to:: Private residence Living Arrangements: Spouse/significant other Available Help at Discharge: Available 24 hours/day Type of Home: House Home Access: Stairs to enter [employer] of Steps: 3 Entrance Stairs-Rails: Right;Left;Can reach both Home Layout: Two level;Able to live on main  level with bedroom/bathroom     Bathroom Shower/Tub: Occupational psychologist: Standard Bathroom Accessibility: Yes How Accessible: Accessible via walker Home Equipment: Shower seat - built in;Walker - 4 wheels;Cane - single point;Bedside commode;Hand held shower head;Walker - 2 wheels          Prior Functioning/Environment Level of Independence: Independent with assistive device(s)        Comments: Pt uses RW for ambulation                           Co-evaluation   Reason for Co-Treatment: Other (comment) (pt's fatigueability due to generalized weakness)          End of Session Equipment Utilized During Treatment: Gait  belt;Rolling walker  Activity Tolerance: Patient tolerated treatment well Patient left: in chair;with call bell/phone within reach;with chair alarm set   Time: 5670-1410 OT Time Calculation (min): 35 min Charges:  OT General Charges $OT Visit: 1 Procedure OT Evaluation $Initial OT Evaluation Tier I: 1 Procedure OT Treatments $Self Care/Home Management : 8-22 mins  Juluis Rainier [phone] 02/19/2014, 4:38 PM

## 2014-02-19 NOTE — Progress Notes (Signed)
Patient's daughter Arbie Cookey stopped by the nurse station.  Gave patient's home medications to Arbie Cookey to take home.

## 2014-02-20 DIAGNOSIS — R197 Diarrhea, unspecified: Secondary | ICD-10-CM | POA: Diagnosis not present

## 2014-02-20 DIAGNOSIS — I10 Essential (primary) hypertension: Secondary | ICD-10-CM | POA: Diagnosis not present

## 2014-02-20 DIAGNOSIS — K5289 Other specified noninfective gastroenteritis and colitis: Secondary | ICD-10-CM | POA: Diagnosis not present

## 2014-02-20 DIAGNOSIS — E872 Acidosis: Secondary | ICD-10-CM | POA: Diagnosis not present

## 2014-02-20 LAB — CULTURE, GROUP A STREP

## 2014-02-20 LAB — BASIC METABOLIC PANEL
BUN: 13 mg/dL (ref 6–23)
CHLORIDE: 98 meq/L (ref 96–112)
CO2: 25 mEq/L (ref 19–32)
Calcium: 8.3 mg/dL — ABNORMAL LOW (ref 8.4–10.5)
Creatinine, Ser: 0.64 mg/dL (ref 0.50–1.10)
GFR calc Af Amer: >90 mL/min (ref >90)
GFR calc non Af Amer: 79 mL/min — ABNORMAL LOW (ref >90)
Glucose, Bld: 96 mg/dL (ref 70–99)
Potassium: 3.9 mEq/L (ref 3.7–5.3)
SODIUM: 136 meq/L — AB (ref 137–147)

## 2014-02-20 LAB — CBC
HEMATOCRIT: 33 % — AB (ref 36.0–46.0)
HEMOGLOBIN: 10.6 g/dL — AB (ref 12.0–15.0)
MCH: 28 pg (ref 26.0–34.0)
MCHC: 32.1 g/dL (ref 30.0–36.0)
MCV: 87.1 fL (ref 78.0–100.0)
Platelets: 379 10*3/uL (ref 150–400)
RBC: 3.79 MIL/uL — ABNORMAL LOW (ref 3.87–5.11)
RDW: 15 % (ref 11.5–15.5)
WBC: 6.2 10*3/uL (ref 4.0–10.5)

## 2014-02-20 MED ORDER — SENNA 8.6 MG PO TABS: 1.0000 | ORAL_TABLET | Freq: Every day | ORAL | Status: DC

## 2014-02-20 MED ORDER — CIPROFLOXACIN HCL 500 MG PO TABS: 500.0000 mg | ORAL_TABLET | Freq: Two times a day (BID) | ORAL | Status: DC

## 2014-02-20 MED ORDER — SACCHAROMYCES BOULARDII 250 MG PO CAPS: 250.0000 mg | ORAL_CAPSULE | Freq: Two times a day (BID) | ORAL | Status: DC

## 2014-02-20 MED ORDER — METRONIDAZOLE 500 MG PO TABS: 500.0000 mg | ORAL_TABLET | Freq: Three times a day (TID) | ORAL | Status: DC

## 2014-02-20 MED ORDER — POLYETHYLENE GLYCOL 3350 17 G PO PACK: 17.0000 g | PACK | Freq: Two times a day (BID) | ORAL | Status: DC

## 2014-02-20 NOTE — Progress Notes (Signed)
02/20/14 Patient being discharged home, IV site removed and discharge instructions reviewed.

## 2014-02-20 NOTE — Discharge Summary (Signed)
PATIENT DETAILS Name: [name] Age: 80 y.o. Sex: female Date of Birth: Sep 10, 1924 MRN: [id number]. Admit Date: 02/18/2014 Admitting Physician: Jonetta Osgood, MD KGY:[email or web address],VZCHY, MD  Recommendations for Outpatient Follow-up:  1. Please follow GI pathogen panel and stool culture-pending at the time of discharge 2. Please repeat CBC and BMET at next visit. 3. Recheck TSH in 3 months  PRIMARY DISCHARGE DIAGNOSIS:  Principal Problem:   Metabolic acidosis Active Problems:   Hypertension   Hypokalemia   Severe diarrhea   Weight loss, unintentional   Rash of neck   Hypothyroidism   Tonsillar exudate   Protein-calorie malnutrition, severe      PAST MEDICAL HISTORY: Past Medical History  Diagnosis Date  . Angina   . Headache(784.0)   . Hypoglycemia   . Fracture 2008    back  . Macular degeneration of both eyes   . Glaucoma   . Hypothyroidism   . Blood transfusion   . Anemia   . GERD (gastroesophageal reflux disease)   . H/O hiatal hernia   . Arthritis   . Chronic back pain   . Thyroid cancer 1986  . Shortness of breath 11/08/11    "here lately, all the time"  . Exertional dyspnea 01/17/12  . Osteomyelitis 11/2011    right knee  . Hypertension   . Pneumonia 01/2010  . Metabolic acidemia 8/50/2774    DISCHARGE MEDICATIONS:   Medication List    STOP taking these medications       triamterene-hydrochlorothiazide 37.5-25 MG per tablet  Commonly known as:  MAXZIDE-25      TAKE these medications       ciprofloxacin 500 MG tablet  Commonly known as:  CIPRO  Take 1 tablet (500 mg total) by mouth 2 (two) times daily.     diazepam 5 MG tablet  Commonly known as:  VALIUM  Take 1 tablet by mouth 2 (two) times daily as needed for anxiety or sedation. For sleep     diphenoxylate-atropine 2.5-0.025 MG per tablet  Commonly known as:  LOMOTIL  Take 1 tablet by mouth 4 (four) times daily as needed for diarrhea or loose stools.     gabapentin 300 MG  capsule  Commonly known as:  NEURONTIN  Take 300 mg by mouth 3 (three) times daily.     latanoprost 0.005 % ophthalmic solution  Commonly known as:  XALATAN  Place 1 drop into both eyes at bedtime.     levothyroxine 150 MCG tablet  Commonly known as:  SYNTHROID, LEVOTHROID  Take 150 mcg by mouth daily before breakfast.     metroNIDAZOLE 500 MG tablet  Commonly known as:  FLAGYL  Take 1 tablet (500 mg total) by mouth 3 (three) times daily.     ondansetron 4 MG tablet  Commonly known as:  ZOFRAN  Take 4 mg by mouth 2 (two) times daily as needed for nausea or vomiting.     polyethylene glycol packet  Commonly known as:  MIRALAX / GLYCOLAX  Take 17 g by mouth 2 (two) times daily.     propranolol 20 MG tablet  Commonly known as:  INDERAL  Take 20 mg by mouth daily. Takes for migraines.     saccharomyces boulardii 250 MG capsule  Commonly known as:  FLORASTOR  Take 1 capsule (250 mg total) by mouth 2 (two) times daily.     senna 8.6 MG Tabs tablet  Commonly known as:  SENOKOT  Take 1 tablet (8.6 mg total)  by mouth at bedtime.        ALLERGIES:   Allergies  Allergen Reactions  . Demerol Shortness Of Breath  . Lorazepam Anxiety  . Penicillins Swelling    "in the face"  . Tetracyclines & Related Rash  . Vancomycin Rash    BRIEF HPI:  See H&P, Labs, Consult and Test reports for all details in brief, patient was admitted for evaluation of diarrhea and dehydration.  CONSULTATIONS:   None  PERTINENT RADIOLOGIC STUDIES: Dg Chest 2 View  02/18/2014   CLINICAL DATA:  80 year old female with nausea vomiting diarrhea and fever. Initial encounter.  EXAM: CHEST  2 VIEW  COMPARISON:  01/17/2012 and earlier.  FINDINGS: Lung volumes are within normal limits. Normal cardiac size and mediastinal contours. Visualized tracheal air column is within normal limits. No pneumothorax, pulmonary edema, pleural effusion or confluent pulmonary opacity. Grossly negative visible bowel gas  pattern. Partially visible lumbar fusion hardware. No acute osseous abnormality identified.  IMPRESSION: No acute cardiopulmonary abnormality.   Electronically Signed   By: Lars Pinks M.D.   On: 02/18/2014 15:19   Ct Abdomen Pelvis W Contrast  02/18/2014   CLINICAL DATA:  Nausea, weight loss and intermittent constipation and diarrhea.  EXAM: CT ABDOMEN AND PELVIS WITH CONTRAST  TECHNIQUE: Multidetector CT imaging of the abdomen and pelvis was performed using the standard protocol following bolus administration of intravenous contrast.  CONTRAST:  64mL OMNIPAQUE IOHEXOL 300 MG/ML  SOLN  COMPARISON:  None.  FINDINGS: The lung bases are clear. No pleural effusion or pulmonary nodule. The heart is normal in size. No pericardial effusion. The distal esophagus is grossly normal.  Numerous small low-attenuation lesions in the liver consistent with benign hepatic cysts. No worrisome hepatic lesions or intrahepatic biliary dilatation. The gallbladder is normal. Mild age related common bile duct dilatation. The pancreas is moderately atrophied but no mass or inflammation. The spleen is normal in size. No focal lesions. The adrenal glands and kidneys are unremarkable. There are small scattered cysts and scarring changes with areas of focal cortical atrophy. No acute abnormality or mass. No hydronephrosis.  The stomach is not well distended but no gross abnormalities are seen. The duodenum and small bowel are unremarkable.  There is diffuse colonic wall thickening also involving the rectum. Suggesting a diffuse colitis. C difficile colitis would be the most likely cause.  No mesenteric or retroperitoneal mass or adenopathy. Moderate atherosclerotic calcifications involving the aorta and iliac arteries but no focal aneurysm or dissection.  The bladder is distended. No bladder wall thickening or mass. The uterus is surgically absent. No pelvic mass or adenopathy. No significant free pelvic fluid collections. No inguinal mass or  adenopathy.  The bony structures are unremarkable. Extensive lumbar fusion hardware is noted.  IMPRESSION: Diffuse colonic wall thickening most likely due to C difficile colitis.  Moderate bladder distention.   Electronically Signed   By: Kalman Jewels M.D.   On: 02/18/2014 16:08     PERTINENT LAB RESULTS: CBC:  Recent Labs  02/19/14 0701 02/20/14 0459  WBC 9.4 6.2  HGB 10.3* 10.6*  HCT 31.1* 33.0*  PLT 368 379   CMET CMP     Component Value Date/Time   NA 136* 02/20/2014 0459   K 3.9 02/20/2014 0459   CL 98 02/20/2014 0459   CO2 25 02/20/2014 0459   GLUCOSE 96 02/20/2014 0459   BUN 13 02/20/2014 0459   CREATININE 0.64 02/20/2014 0459   CREATININE 0.85 12/18/2011 1111   CALCIUM  8.3* 02/20/2014 0459   PROT 4.5* 02/19/2014 0701   ALBUMIN 2.2* 02/19/2014 0701   AST 10 02/19/2014 0701   ALT <5 02/19/2014 0701   ALKPHOS 75 02/19/2014 0701   BILITOT 0.4 02/19/2014 0701   GFRNONAA 79* 02/20/2014 0459   GFRAA >90 02/20/2014 0459    GFR Estimated Creatinine Clearance: 42.1 ml/min (by C-G formula based on Cr of 0.64).  Recent Labs  02/18/14 1709  LIPASE 7*    Recent Labs  02/18/14 1213  TROPONINI <0.30   No components found with this basename: POCBNP,  No results found for this basename: DDIMER,  in the last 72 hours No results found for this basename: HGBA1C,  in the last 72 hours No results found for this basename: CHOL, HDL, LDLCALC, TRIG, CHOLHDL, LDLDIRECT,  in the last 72 hours  Recent Labs  02/18/14 1709  TSH 0.061*   No results found for this basename: VITAMINB12, FOLATE, FERRITIN, TIBC, IRON, RETICCTPCT,  in the last 72 hours Coags: No results found for this basename: PT, INR,  in the last 72 hours Microbiology: Recent Results (from the past 240 hour(s))  RAPID STREP SCREEN     Status: None   Collection Time    02/18/14  5:26 PM      Result Value Ref Range Status   Streptococcus, Group A Screen (Direct) NEGATIVE  NEGATIVE Final   Comment: (NOTE)     A Rapid  Antigen test may result negative if the antigen level in the     sample is below the detection level of this test. The FDA has not     cleared this test as a stand-alone test therefore the rapid antigen     negative result has reflexed to a Group A Strep culture.  FUNGUS CULTURE W SMEAR     Status: None   Collection Time    02/18/14  5:26 PM      Result Value Ref Range Status   Specimen Description THROAT   Final   Special Requests Immunocompromised   Final   Fungal Smear     Final   Value: FEW Y     Performed at [facility]   Culture     Final   Value: Culture in Progress for 7 days     Performed at [facility]   Report Status PENDING   Incomplete  CULTURE, GROUP A STREP     Status: None   Collection Time    02/18/14  5:26 PM      Result Value Ref Range Status   Specimen Description THROAT   Final   Special Requests NONE   Final   Culture     Final   Value: NO SUSPICIOUS COLONIES, CONTINUING TO HOLD     Performed at [facility]   Report Status PENDING   Incomplete  CLOSTRIDIUM DIFFICILE BY PCR     Status: None   Collection Time    02/19/14  7:38 AM      Result Value Ref Range Status   C difficile by pcr NEGATIVE  NEGATIVE Final     BRIEF HOSPITAL COURSE:  Colitis  -suspect infectious, C Diff PCR neg.Stool culture and GI Pathogen panel negative. CT Abdomen revealed diffuse colonic wall thickening.Admitted and started on IVF, IV Cipro and Flagyl. Significantly improved, diarrhea has essentially resolved. Since significantly better will transition to oral Cipro and Flagyl.  Metabolic acidosis  -  suspect due to diarrhea and dehydration  -  Resolved with IVF.  Leukocytosis  - resolved. Likely secondary to Colitis   Weight Loss in context of severe malnutrition  - pt family reports 40 lb weight loss in last 4 months, suspect that may have been due to excessive Levothyroxine-TSH was suppressed. Dose of levothyroxine decreased to 125 mcg. Suggest  further work up to be done in the outpatient setting, including age appropriate cancer screening.  Hypothyroidism s/p thyroidectomy  - c/w Synthroid, TSH is low (0.061)  -  decrease synthroid dose from 150 mcg to 125 mcg-recheck TSH in 3 months   Chronic back pain, on chronic narcotics  - h/o previous surgeries, c/w home narcotic regimen   HTN  - stable  - HCTZ/triamterene being held on discharge, as BP controlled with propranolol  TODAY-DAY OF DISCHARGE:  Subjective:   [name] today has no headache,no chest abdominal pain,no new weakness tingling or numbness, feels much better wants to go home today.   Objective:   Blood pressure 116/66, pulse 70, temperature 97.6 F (36.4 C), temperature source Oral, resp. rate 18, height 5\' 4"  (1.626 m), weight 52.799 kg (116 lb 6.4 oz), SpO2 100.00%.  Intake/Output Summary (Last 24 hours) at 02/20/14 1132 Last data filed at 02/20/14 0854  Gross per 24 hour  Intake    360 ml  Output      0 ml  Net    360 ml   Filed Weights   02/18/14 1153 02/18/14 1646  Weight: 58.968 kg (130 lb) 52.799 kg (116 lb 6.4 oz)    Exam Awake Alert, Oriented *3, No new F.N deficits, Normal affect [place].AT,PERRAL Supple Neck,No JVD, No cervical lymphadenopathy appriciated.  Symmetrical Chest wall movement, Good air movement bilaterally, CTAB RRR,No Gallops,Rubs or new Murmurs, No Parasternal Heave +ve B.Sounds, Abd Soft, Non tender, No organomegaly appriciated, No rebound -guarding or rigidity. No Cyanosis, Clubbing or edema, No new Rash or bruise  DISCHARGE CONDITION: Stable  DISPOSITION: Home  DISCHARGE INSTRUCTIONS:    Activity:  As tolerated with Full fall precautions use walker/cane & assistance as needed  Diet recommendation: Heart Healthy diet   Discharge Instructions   Call MD for:  difficulty breathing, headache or visual disturbances    Complete by:  As directed      Call MD for:  extreme fatigue    Complete by:  As directed        Call MD for:  persistant nausea and vomiting    Complete by:  As directed      Call MD for:  severe uncontrolled pain    Complete by:  As directed      Diet - low sodium heart healthy    Complete by:  As directed      Increase activity slowly    Complete by:  As directed            Follow-up Information   Follow up with [facility], [place]. Schedule an appointment as soon as possible for a visit in 1 week.   Specialty:  Internal Medicine   Contact information:   [address] [place] [address] [phone]      Total Time spent on discharge equals 45 minutes.  SignedOren Binet 02/20/2014 11:32 AM  **Disclaimer: This note may have been dictated with voice recognition software. Similar sounding words can inadvertently be transcribed and this note may contain transcription errors which may not have been corrected upon publication of note.**

## 2014-02-20 NOTE — Care Management Note (Signed)
    Page 1 of 1   02/20/2014     3:40:02 PM CARE MANAGEMENT NOTE 02/20/2014  Patient:  [name], [name]   Account Number:  [id number]  Date Initiated:  02/19/2014  Documentation initiated by:  [facility]  Subjective/Objective Assessment:   diarrhea     Action/Plan:   home with husband   Anticipated DC Date:  02/20/2014   Anticipated DC Plan:  [place]  CM consult      Choice offered to / List presented to:             Status of service:  Completed, signed off Medicare Important Message given?  NA - LOS <3 / Initial given by admissions (If response is "NO", the following Medicare IM given date fields will be blank) Date Medicare IM given:   Date Additional Medicare IM given:    Discharge Disposition:  HOME/SELF CARE  Per UR Regulation:  Reviewed for med. necessity/level of care/duration of stay  If discussed at [place] of Stay Meetings, dates discussed:    Comments:

## 2014-02-21 LAB — FUNGUS CULTURE W SMEAR

## 2014-02-22 LAB — GI PATHOGEN PANEL BY PCR, STOOL
C DIFFICILE TOXIN A/B: NEGATIVE
CAMPYLOBACTER BY PCR: NEGATIVE
Cryptosporidium by PCR: NEGATIVE
E COLI 0157 BY PCR: NEGATIVE
E coli (ETEC) LT/ST: NEGATIVE
E coli (STEC): NEGATIVE
G lamblia by PCR: NEGATIVE
NOROVIRUS G1/G2: NEGATIVE
Rotavirus A by PCR: NEGATIVE
Salmonella by PCR: NEGATIVE
Shigella by PCR: NEGATIVE

## 2014-02-23 DIAGNOSIS — E039 Hypothyroidism, unspecified: Secondary | ICD-10-CM | POA: Diagnosis not present

## 2014-02-23 DIAGNOSIS — R112 Nausea with vomiting, unspecified: Secondary | ICD-10-CM | POA: Diagnosis not present

## 2014-02-23 DIAGNOSIS — K5289 Other specified noninfective gastroenteritis and colitis: Secondary | ICD-10-CM | POA: Diagnosis not present

## 2014-02-23 DIAGNOSIS — R634 Abnormal weight loss: Secondary | ICD-10-CM | POA: Diagnosis not present

## 2014-02-24 LAB — STOOL CULTURE

## 2014-02-25 LAB — CULTURE, BLOOD (ROUTINE X 2)
CULTURE: NO GROWTH
Culture: NO GROWTH

## 2014-03-22 DIAGNOSIS — M259 Joint disorder, unspecified: Secondary | ICD-10-CM | POA: Diagnosis not present

## 2014-03-22 DIAGNOSIS — M961 Postlaminectomy syndrome, not elsewhere classified: Secondary | ICD-10-CM | POA: Diagnosis not present

## 2014-03-22 DIAGNOSIS — M5137 Other intervertebral disc degeneration, lumbosacral region: Secondary | ICD-10-CM | POA: Diagnosis not present

## 2014-04-14 DIAGNOSIS — K5289 Other specified noninfective gastroenteritis and colitis: Secondary | ICD-10-CM | POA: Diagnosis not present

## 2014-04-14 DIAGNOSIS — E039 Hypothyroidism, unspecified: Secondary | ICD-10-CM | POA: Diagnosis not present

## 2014-04-20 DIAGNOSIS — R5381 Other malaise: Secondary | ICD-10-CM | POA: Diagnosis not present

## 2014-04-20 DIAGNOSIS — R5383 Other fatigue: Secondary | ICD-10-CM | POA: Diagnosis not present

## 2014-04-20 DIAGNOSIS — E039 Hypothyroidism, unspecified: Secondary | ICD-10-CM | POA: Diagnosis not present

## 2014-04-20 DIAGNOSIS — R634 Abnormal weight loss: Secondary | ICD-10-CM | POA: Diagnosis not present

## 2014-04-20 DIAGNOSIS — K5289 Other specified noninfective gastroenteritis and colitis: Secondary | ICD-10-CM | POA: Diagnosis not present

## 2014-04-27 DIAGNOSIS — H4011X0 Primary open-angle glaucoma, stage unspecified: Secondary | ICD-10-CM | POA: Diagnosis not present

## 2014-04-27 DIAGNOSIS — H26499 Other secondary cataract, unspecified eye: Secondary | ICD-10-CM | POA: Diagnosis not present

## 2014-04-27 DIAGNOSIS — H353190 Nonexudative age-related macular degeneration, unspecified eye, stage unspecified: Secondary | ICD-10-CM | POA: Diagnosis not present

## 2014-04-27 DIAGNOSIS — Z961 Presence of intraocular lens: Secondary | ICD-10-CM | POA: Diagnosis not present

## 2014-05-24 DIAGNOSIS — M961 Postlaminectomy syndrome, not elsewhere classified: Secondary | ICD-10-CM | POA: Diagnosis not present

## 2014-05-24 DIAGNOSIS — M5137 Other intervertebral disc degeneration, lumbosacral region: Secondary | ICD-10-CM | POA: Diagnosis not present

## 2014-05-24 DIAGNOSIS — M259 Joint disorder, unspecified: Secondary | ICD-10-CM | POA: Diagnosis not present

## 2014-06-16 DIAGNOSIS — M19032 Primary osteoarthritis, left wrist: Secondary | ICD-10-CM | POA: Diagnosis not present

## 2014-06-16 DIAGNOSIS — M1812 Unilateral primary osteoarthritis of first carpometacarpal joint, left hand: Secondary | ICD-10-CM | POA: Diagnosis not present

## 2014-07-19 DIAGNOSIS — M5416 Radiculopathy, lumbar region: Secondary | ICD-10-CM | POA: Diagnosis not present

## 2014-07-19 DIAGNOSIS — M961 Postlaminectomy syndrome, not elsewhere classified: Secondary | ICD-10-CM | POA: Diagnosis not present

## 2014-08-03 DIAGNOSIS — E039 Hypothyroidism, unspecified: Secondary | ICD-10-CM | POA: Diagnosis not present

## 2014-08-03 DIAGNOSIS — N390 Urinary tract infection, site not specified: Secondary | ICD-10-CM | POA: Diagnosis not present

## 2014-08-03 DIAGNOSIS — K529 Noninfective gastroenteritis and colitis, unspecified: Secondary | ICD-10-CM | POA: Diagnosis not present

## 2014-08-03 DIAGNOSIS — R634 Abnormal weight loss: Secondary | ICD-10-CM | POA: Diagnosis not present

## 2014-08-04 DIAGNOSIS — H01021 Squamous blepharitis right upper eyelid: Secondary | ICD-10-CM | POA: Diagnosis not present

## 2014-08-04 DIAGNOSIS — H01024 Squamous blepharitis left upper eyelid: Secondary | ICD-10-CM | POA: Diagnosis not present

## 2014-08-04 DIAGNOSIS — H01025 Squamous blepharitis left lower eyelid: Secondary | ICD-10-CM | POA: Diagnosis not present

## 2014-08-04 DIAGNOSIS — H3532 Exudative age-related macular degeneration: Secondary | ICD-10-CM | POA: Diagnosis not present

## 2014-08-04 DIAGNOSIS — Z961 Presence of intraocular lens: Secondary | ICD-10-CM | POA: Diagnosis not present

## 2014-08-04 DIAGNOSIS — H4011X1 Primary open-angle glaucoma, mild stage: Secondary | ICD-10-CM | POA: Diagnosis not present

## 2014-08-04 DIAGNOSIS — H4011X3 Primary open-angle glaucoma, severe stage: Secondary | ICD-10-CM | POA: Diagnosis not present

## 2014-08-04 DIAGNOSIS — H01022 Squamous blepharitis right lower eyelid: Secondary | ICD-10-CM | POA: Diagnosis not present

## 2014-08-10 DIAGNOSIS — R634 Abnormal weight loss: Secondary | ICD-10-CM | POA: Diagnosis not present

## 2014-08-10 DIAGNOSIS — E039 Hypothyroidism, unspecified: Secondary | ICD-10-CM | POA: Diagnosis not present

## 2014-08-10 DIAGNOSIS — D500 Iron deficiency anemia secondary to blood loss (chronic): Secondary | ICD-10-CM | POA: Diagnosis not present

## 2014-08-10 DIAGNOSIS — Z8619 Personal history of other infectious and parasitic diseases: Secondary | ICD-10-CM | POA: Diagnosis not present

## 2014-08-17 DIAGNOSIS — M961 Postlaminectomy syndrome, not elsewhere classified: Secondary | ICD-10-CM | POA: Diagnosis not present

## 2014-08-17 DIAGNOSIS — M5137 Other intervertebral disc degeneration, lumbosacral region: Secondary | ICD-10-CM | POA: Diagnosis not present

## 2014-08-17 DIAGNOSIS — M5416 Radiculopathy, lumbar region: Secondary | ICD-10-CM | POA: Diagnosis not present

## 2014-08-24 DIAGNOSIS — D500 Iron deficiency anemia secondary to blood loss (chronic): Secondary | ICD-10-CM | POA: Diagnosis not present

## 2014-10-14 DIAGNOSIS — M961 Postlaminectomy syndrome, not elsewhere classified: Secondary | ICD-10-CM | POA: Diagnosis not present

## 2014-10-14 DIAGNOSIS — M5137 Other intervertebral disc degeneration, lumbosacral region: Secondary | ICD-10-CM | POA: Diagnosis not present

## 2014-10-14 DIAGNOSIS — M5416 Radiculopathy, lumbar region: Secondary | ICD-10-CM | POA: Diagnosis not present

## 2014-11-02 DIAGNOSIS — D500 Iron deficiency anemia secondary to blood loss (chronic): Secondary | ICD-10-CM | POA: Diagnosis not present

## 2014-11-09 DIAGNOSIS — D500 Iron deficiency anemia secondary to blood loss (chronic): Secondary | ICD-10-CM | POA: Diagnosis not present

## 2014-11-09 DIAGNOSIS — K5901 Slow transit constipation: Secondary | ICD-10-CM | POA: Diagnosis not present

## 2014-11-09 DIAGNOSIS — E039 Hypothyroidism, unspecified: Secondary | ICD-10-CM | POA: Diagnosis not present

## 2014-11-09 DIAGNOSIS — R634 Abnormal weight loss: Secondary | ICD-10-CM | POA: Diagnosis not present

## 2014-11-25 DIAGNOSIS — Z1389 Encounter for screening for other disorder: Secondary | ICD-10-CM | POA: Diagnosis not present

## 2014-11-25 DIAGNOSIS — R609 Edema, unspecified: Secondary | ICD-10-CM | POA: Diagnosis not present

## 2014-11-25 DIAGNOSIS — D649 Anemia, unspecified: Secondary | ICD-10-CM | POA: Diagnosis not present

## 2014-11-25 DIAGNOSIS — M1990 Unspecified osteoarthritis, unspecified site: Secondary | ICD-10-CM | POA: Diagnosis not present

## 2014-11-25 DIAGNOSIS — I10 Essential (primary) hypertension: Secondary | ICD-10-CM | POA: Diagnosis not present

## 2014-11-25 DIAGNOSIS — M4306 Spondylolysis, lumbar region: Secondary | ICD-10-CM | POA: Diagnosis not present

## 2014-11-25 DIAGNOSIS — Z008 Encounter for other general examination: Secondary | ICD-10-CM | POA: Diagnosis not present

## 2014-11-25 DIAGNOSIS — E039 Hypothyroidism, unspecified: Secondary | ICD-10-CM | POA: Diagnosis not present

## 2014-11-25 DIAGNOSIS — M549 Dorsalgia, unspecified: Secondary | ICD-10-CM | POA: Diagnosis not present

## 2014-12-10 ENCOUNTER — Other Ambulatory Visit: Payer: Self-pay | Admitting: Internal Medicine

## 2014-12-10 DIAGNOSIS — M47816 Spondylosis without myelopathy or radiculopathy, lumbar region: Secondary | ICD-10-CM

## 2014-12-10 DIAGNOSIS — M545 Low back pain: Secondary | ICD-10-CM

## 2014-12-13 DIAGNOSIS — Z1212 Encounter for screening for malignant neoplasm of rectum: Secondary | ICD-10-CM | POA: Diagnosis not present

## 2014-12-14 DIAGNOSIS — M5416 Radiculopathy, lumbar region: Secondary | ICD-10-CM | POA: Diagnosis not present

## 2014-12-14 DIAGNOSIS — M259 Joint disorder, unspecified: Secondary | ICD-10-CM | POA: Diagnosis not present

## 2014-12-14 DIAGNOSIS — M961 Postlaminectomy syndrome, not elsewhere classified: Secondary | ICD-10-CM | POA: Diagnosis not present

## 2014-12-23 ENCOUNTER — Ambulatory Visit
Admission: RE | Admit: 2014-12-23 | Discharge: 2014-12-23 | Disposition: A | Discharge status: Home / Self Care | Payer: Medicare Other | Source: Ambulatory Visit | Attending: Internal Medicine | Admitting: Internal Medicine

## 2014-12-23 DIAGNOSIS — M545 Low back pain: Secondary | ICD-10-CM

## 2014-12-23 DIAGNOSIS — M4806 Spinal stenosis, lumbar region: Secondary | ICD-10-CM | POA: Diagnosis not present

## 2014-12-23 DIAGNOSIS — M5126 Other intervertebral disc displacement, lumbar region: Secondary | ICD-10-CM | POA: Diagnosis not present

## 2014-12-23 DIAGNOSIS — M47816 Spondylosis without myelopathy or radiculopathy, lumbar region: Secondary | ICD-10-CM | POA: Diagnosis not present

## 2014-12-23 DIAGNOSIS — M4327 Fusion of spine, lumbosacral region: Secondary | ICD-10-CM | POA: Diagnosis not present

## 2014-12-23 MED ORDER — GADOBENATE DIMEGLUMINE 529 MG/ML IV SOLN
10.0000 mL | Freq: Once | INTRAVENOUS | Status: AC | PRN
  Administered 2014-12-23: 10 mL via INTRAVENOUS

## 2014-12-24 DIAGNOSIS — Z6822 Body mass index (BMI) 22.0-22.9, adult: Secondary | ICD-10-CM | POA: Diagnosis not present

## 2014-12-24 DIAGNOSIS — K921 Melena: Secondary | ICD-10-CM | POA: Diagnosis not present

## 2014-12-24 DIAGNOSIS — I10 Essential (primary) hypertension: Secondary | ICD-10-CM | POA: Diagnosis not present

## 2014-12-24 DIAGNOSIS — M1990 Unspecified osteoarthritis, unspecified site: Secondary | ICD-10-CM | POA: Diagnosis not present

## 2014-12-24 DIAGNOSIS — E039 Hypothyroidism, unspecified: Secondary | ICD-10-CM | POA: Diagnosis not present

## 2014-12-24 DIAGNOSIS — M549 Dorsalgia, unspecified: Secondary | ICD-10-CM | POA: Diagnosis not present

## 2014-12-24 DIAGNOSIS — M4306 Spondylolysis, lumbar region: Secondary | ICD-10-CM | POA: Diagnosis not present

## 2015-01-10 ENCOUNTER — Other Ambulatory Visit: Payer: Self-pay | Admitting: Internal Medicine

## 2015-01-10 DIAGNOSIS — M47816 Spondylosis without myelopathy or radiculopathy, lumbar region: Secondary | ICD-10-CM

## 2015-01-11 ENCOUNTER — Ambulatory Visit
Admission: RE | Admit: 2015-01-11 | Discharge: 2015-01-11 | Disposition: A | Discharge status: Home / Self Care | Payer: Medicare Other | Source: Ambulatory Visit | Attending: Internal Medicine | Admitting: Internal Medicine

## 2015-01-11 DIAGNOSIS — M47816 Spondylosis without myelopathy or radiculopathy, lumbar region: Secondary | ICD-10-CM

## 2015-01-11 DIAGNOSIS — M545 Low back pain: Secondary | ICD-10-CM | POA: Diagnosis not present

## 2015-01-11 MED ORDER — METHYLPREDNISOLONE ACETATE 40 MG/ML INJ SUSP (RADIOLOG
120.0000 mg | Freq: Once | INTRAMUSCULAR | Status: AC
  Administered 2015-01-11: 120 mg via EPIDURAL

## 2015-01-11 MED ORDER — IOHEXOL 180 MG/ML  SOLN
1.0000 mL | Freq: Once | INTRAMUSCULAR | Status: AC | PRN
  Administered 2015-01-11: 1 mL via EPIDURAL

## 2015-01-11 NOTE — Discharge Instructions (Signed)

## 2015-01-12 DIAGNOSIS — M810 Age-related osteoporosis without current pathological fracture: Secondary | ICD-10-CM | POA: Diagnosis not present

## 2015-01-12 DIAGNOSIS — E890 Postprocedural hypothyroidism: Secondary | ICD-10-CM | POA: Diagnosis not present

## 2015-01-12 DIAGNOSIS — C73 Malignant neoplasm of thyroid gland: Secondary | ICD-10-CM | POA: Diagnosis not present

## 2015-01-12 DIAGNOSIS — Z8781 Personal history of (healed) traumatic fracture: Secondary | ICD-10-CM | POA: Diagnosis not present

## 2015-02-03 DIAGNOSIS — H4011X3 Primary open-angle glaucoma, severe stage: Secondary | ICD-10-CM | POA: Diagnosis not present

## 2015-02-03 DIAGNOSIS — H4011X1 Primary open-angle glaucoma, mild stage: Secondary | ICD-10-CM | POA: Diagnosis not present

## 2015-02-03 DIAGNOSIS — H3532 Exudative age-related macular degeneration: Secondary | ICD-10-CM | POA: Diagnosis not present

## 2015-02-04 DIAGNOSIS — M5137 Other intervertebral disc degeneration, lumbosacral region: Secondary | ICD-10-CM | POA: Diagnosis not present

## 2015-02-04 DIAGNOSIS — M961 Postlaminectomy syndrome, not elsewhere classified: Secondary | ICD-10-CM | POA: Diagnosis not present

## 2015-02-04 DIAGNOSIS — M5416 Radiculopathy, lumbar region: Secondary | ICD-10-CM | POA: Diagnosis not present

## 2015-02-25 DIAGNOSIS — H43813 Vitreous degeneration, bilateral: Secondary | ICD-10-CM | POA: Diagnosis not present

## 2015-02-25 DIAGNOSIS — H3532 Exudative age-related macular degeneration: Secondary | ICD-10-CM | POA: Diagnosis not present

## 2015-02-25 DIAGNOSIS — H35053 Retinal neovascularization, unspecified, bilateral: Secondary | ICD-10-CM | POA: Diagnosis not present

## 2015-03-18 DIAGNOSIS — H3532 Exudative age-related macular degeneration: Secondary | ICD-10-CM | POA: Diagnosis not present

## 2015-03-18 DIAGNOSIS — H35053 Retinal neovascularization, unspecified, bilateral: Secondary | ICD-10-CM | POA: Diagnosis not present

## 2015-04-05 DIAGNOSIS — M5416 Radiculopathy, lumbar region: Secondary | ICD-10-CM | POA: Diagnosis not present

## 2015-04-05 DIAGNOSIS — M5137 Other intervertebral disc degeneration, lumbosacral region: Secondary | ICD-10-CM | POA: Diagnosis not present

## 2015-04-05 DIAGNOSIS — M961 Postlaminectomy syndrome, not elsewhere classified: Secondary | ICD-10-CM | POA: Diagnosis not present

## 2015-04-28 DIAGNOSIS — H35053 Retinal neovascularization, unspecified, bilateral: Secondary | ICD-10-CM | POA: Diagnosis not present

## 2015-04-28 DIAGNOSIS — H3532 Exudative age-related macular degeneration: Secondary | ICD-10-CM | POA: Diagnosis not present

## 2015-05-02 DIAGNOSIS — R0609 Other forms of dyspnea: Secondary | ICD-10-CM | POA: Diagnosis not present

## 2015-05-02 DIAGNOSIS — R600 Localized edema: Secondary | ICD-10-CM | POA: Diagnosis not present

## 2015-05-02 DIAGNOSIS — D649 Anemia, unspecified: Secondary | ICD-10-CM | POA: Diagnosis not present

## 2015-05-02 DIAGNOSIS — Z6826 Body mass index (BMI) 26.0-26.9, adult: Secondary | ICD-10-CM | POA: Diagnosis not present

## 2015-05-02 DIAGNOSIS — I10 Essential (primary) hypertension: Secondary | ICD-10-CM | POA: Diagnosis not present

## 2015-05-06 DIAGNOSIS — E039 Hypothyroidism, unspecified: Secondary | ICD-10-CM | POA: Diagnosis not present

## 2015-05-06 DIAGNOSIS — Z6825 Body mass index (BMI) 25.0-25.9, adult: Secondary | ICD-10-CM | POA: Diagnosis not present

## 2015-05-06 DIAGNOSIS — I10 Essential (primary) hypertension: Secondary | ICD-10-CM | POA: Diagnosis not present

## 2015-05-06 DIAGNOSIS — M1990 Unspecified osteoarthritis, unspecified site: Secondary | ICD-10-CM | POA: Diagnosis not present

## 2015-05-06 DIAGNOSIS — M549 Dorsalgia, unspecified: Secondary | ICD-10-CM | POA: Diagnosis not present

## 2015-05-26 DIAGNOSIS — M961 Postlaminectomy syndrome, not elsewhere classified: Secondary | ICD-10-CM | POA: Diagnosis not present

## 2015-05-26 DIAGNOSIS — M5137 Other intervertebral disc degeneration, lumbosacral region: Secondary | ICD-10-CM | POA: Diagnosis not present

## 2015-05-26 DIAGNOSIS — M5416 Radiculopathy, lumbar region: Secondary | ICD-10-CM | POA: Diagnosis not present

## 2015-05-31 DIAGNOSIS — H35053 Retinal neovascularization, unspecified, bilateral: Secondary | ICD-10-CM | POA: Diagnosis not present

## 2015-05-31 DIAGNOSIS — H3532 Exudative age-related macular degeneration: Secondary | ICD-10-CM | POA: Diagnosis not present

## 2015-05-31 DIAGNOSIS — H43813 Vitreous degeneration, bilateral: Secondary | ICD-10-CM | POA: Diagnosis not present

## 2015-07-05 DIAGNOSIS — H353211 Exudative age-related macular degeneration, right eye, with active choroidal neovascularization: Secondary | ICD-10-CM | POA: Diagnosis not present

## 2015-07-05 DIAGNOSIS — H353222 Exudative age-related macular degeneration, left eye, with inactive choroidal neovascularization: Secondary | ICD-10-CM | POA: Diagnosis not present

## 2015-07-05 DIAGNOSIS — H43813 Vitreous degeneration, bilateral: Secondary | ICD-10-CM | POA: Diagnosis not present

## 2015-07-26 DIAGNOSIS — M5137 Other intervertebral disc degeneration, lumbosacral region: Secondary | ICD-10-CM | POA: Diagnosis not present

## 2015-07-26 DIAGNOSIS — M5416 Radiculopathy, lumbar region: Secondary | ICD-10-CM | POA: Diagnosis not present

## 2015-07-26 DIAGNOSIS — M961 Postlaminectomy syndrome, not elsewhere classified: Secondary | ICD-10-CM | POA: Diagnosis not present

## 2015-08-05 DIAGNOSIS — H401121 Primary open-angle glaucoma, left eye, mild stage: Secondary | ICD-10-CM | POA: Diagnosis not present

## 2015-08-05 DIAGNOSIS — Z961 Presence of intraocular lens: Secondary | ICD-10-CM | POA: Diagnosis not present

## 2015-08-05 DIAGNOSIS — H401113 Primary open-angle glaucoma, right eye, severe stage: Secondary | ICD-10-CM | POA: Diagnosis not present

## 2015-08-09 DIAGNOSIS — H353222 Exudative age-related macular degeneration, left eye, with inactive choroidal neovascularization: Secondary | ICD-10-CM | POA: Diagnosis not present

## 2015-08-09 DIAGNOSIS — H353211 Exudative age-related macular degeneration, right eye, with active choroidal neovascularization: Secondary | ICD-10-CM | POA: Diagnosis not present

## 2015-08-09 DIAGNOSIS — H43813 Vitreous degeneration, bilateral: Secondary | ICD-10-CM | POA: Diagnosis not present

## 2015-08-18 DIAGNOSIS — H401133 Primary open-angle glaucoma, bilateral, severe stage: Secondary | ICD-10-CM | POA: Diagnosis not present

## 2015-08-18 DIAGNOSIS — Z961 Presence of intraocular lens: Secondary | ICD-10-CM | POA: Diagnosis not present

## 2015-09-08 DIAGNOSIS — H401121 Primary open-angle glaucoma, left eye, mild stage: Secondary | ICD-10-CM | POA: Diagnosis not present

## 2015-09-12 DIAGNOSIS — H353211 Exudative age-related macular degeneration, right eye, with active choroidal neovascularization: Secondary | ICD-10-CM | POA: Diagnosis not present

## 2015-09-12 DIAGNOSIS — H43813 Vitreous degeneration, bilateral: Secondary | ICD-10-CM | POA: Diagnosis not present

## 2015-09-12 DIAGNOSIS — H353222 Exudative age-related macular degeneration, left eye, with inactive choroidal neovascularization: Secondary | ICD-10-CM | POA: Diagnosis not present

## 2015-09-26 DIAGNOSIS — Z5181 Encounter for therapeutic drug level monitoring: Secondary | ICD-10-CM | POA: Diagnosis not present

## 2015-09-26 DIAGNOSIS — M5137 Other intervertebral disc degeneration, lumbosacral region: Secondary | ICD-10-CM | POA: Diagnosis not present

## 2015-09-26 DIAGNOSIS — M5416 Radiculopathy, lumbar region: Secondary | ICD-10-CM | POA: Diagnosis not present

## 2015-09-26 DIAGNOSIS — M961 Postlaminectomy syndrome, not elsewhere classified: Secondary | ICD-10-CM | POA: Diagnosis not present

## 2015-10-31 DIAGNOSIS — H353211 Exudative age-related macular degeneration, right eye, with active choroidal neovascularization: Secondary | ICD-10-CM | POA: Diagnosis not present

## 2015-10-31 DIAGNOSIS — H43813 Vitreous degeneration, bilateral: Secondary | ICD-10-CM | POA: Diagnosis not present

## 2015-10-31 DIAGNOSIS — H353222 Exudative age-related macular degeneration, left eye, with inactive choroidal neovascularization: Secondary | ICD-10-CM | POA: Diagnosis not present

## 2015-11-04 DIAGNOSIS — E039 Hypothyroidism, unspecified: Secondary | ICD-10-CM | POA: Diagnosis not present

## 2015-11-04 DIAGNOSIS — D649 Anemia, unspecified: Secondary | ICD-10-CM | POA: Diagnosis not present

## 2015-11-04 DIAGNOSIS — R600 Localized edema: Secondary | ICD-10-CM | POA: Diagnosis not present

## 2015-11-04 DIAGNOSIS — M4306 Spondylolysis, lumbar region: Secondary | ICD-10-CM | POA: Diagnosis not present

## 2015-11-04 DIAGNOSIS — R0609 Other forms of dyspnea: Secondary | ICD-10-CM | POA: Diagnosis not present

## 2015-11-04 DIAGNOSIS — Z1389 Encounter for screening for other disorder: Secondary | ICD-10-CM | POA: Diagnosis not present

## 2015-11-04 DIAGNOSIS — M1990 Unspecified osteoarthritis, unspecified site: Secondary | ICD-10-CM | POA: Diagnosis not present

## 2015-11-04 DIAGNOSIS — M549 Dorsalgia, unspecified: Secondary | ICD-10-CM | POA: Diagnosis not present

## 2015-11-04 DIAGNOSIS — Z6827 Body mass index (BMI) 27.0-27.9, adult: Secondary | ICD-10-CM | POA: Diagnosis not present

## 2015-11-22 DIAGNOSIS — M5416 Radiculopathy, lumbar region: Secondary | ICD-10-CM | POA: Diagnosis not present

## 2015-11-22 DIAGNOSIS — M5137 Other intervertebral disc degeneration, lumbosacral region: Secondary | ICD-10-CM | POA: Diagnosis not present

## 2015-11-22 DIAGNOSIS — M961 Postlaminectomy syndrome, not elsewhere classified: Secondary | ICD-10-CM | POA: Diagnosis not present

## 2015-12-09 DIAGNOSIS — H401133 Primary open-angle glaucoma, bilateral, severe stage: Secondary | ICD-10-CM | POA: Diagnosis not present

## 2015-12-09 DIAGNOSIS — Z961 Presence of intraocular lens: Secondary | ICD-10-CM | POA: Diagnosis not present

## 2016-01-02 DIAGNOSIS — H43813 Vitreous degeneration, bilateral: Secondary | ICD-10-CM | POA: Diagnosis not present

## 2016-01-02 DIAGNOSIS — H353222 Exudative age-related macular degeneration, left eye, with inactive choroidal neovascularization: Secondary | ICD-10-CM | POA: Diagnosis not present

## 2016-01-02 DIAGNOSIS — H353221 Exudative age-related macular degeneration, left eye, with active choroidal neovascularization: Secondary | ICD-10-CM | POA: Diagnosis not present

## 2016-01-17 DIAGNOSIS — M5137 Other intervertebral disc degeneration, lumbosacral region: Secondary | ICD-10-CM | POA: Diagnosis not present

## 2016-01-17 DIAGNOSIS — M5416 Radiculopathy, lumbar region: Secondary | ICD-10-CM | POA: Diagnosis not present

## 2016-01-17 DIAGNOSIS — E890 Postprocedural hypothyroidism: Secondary | ICD-10-CM | POA: Diagnosis not present

## 2016-01-17 DIAGNOSIS — C73 Malignant neoplasm of thyroid gland: Secondary | ICD-10-CM | POA: Diagnosis not present

## 2016-01-17 DIAGNOSIS — M961 Postlaminectomy syndrome, not elsewhere classified: Secondary | ICD-10-CM | POA: Diagnosis not present

## 2016-01-19 DIAGNOSIS — M810 Age-related osteoporosis without current pathological fracture: Secondary | ICD-10-CM | POA: Diagnosis not present

## 2016-01-19 DIAGNOSIS — E890 Postprocedural hypothyroidism: Secondary | ICD-10-CM | POA: Diagnosis not present

## 2016-01-19 DIAGNOSIS — Z8781 Personal history of (healed) traumatic fracture: Secondary | ICD-10-CM | POA: Diagnosis not present

## 2016-01-19 DIAGNOSIS — Z85850 Personal history of malignant neoplasm of thyroid: Secondary | ICD-10-CM | POA: Diagnosis not present

## 2016-01-20 DIAGNOSIS — Z6826 Body mass index (BMI) 26.0-26.9, adult: Secondary | ICD-10-CM | POA: Diagnosis not present

## 2016-01-20 DIAGNOSIS — I10 Essential (primary) hypertension: Secondary | ICD-10-CM | POA: Diagnosis not present

## 2016-01-20 DIAGNOSIS — R600 Localized edema: Secondary | ICD-10-CM | POA: Diagnosis not present

## 2016-01-21 ENCOUNTER — Inpatient Hospital Stay (HOSPITAL_COMMUNITY)
Admission: EM | Admit: 2016-01-21 | Discharge: 2016-01-26 | DRG: 470 | Disposition: A | Discharge status: Home Health | Payer: Medicare Other | Attending: Family Medicine | Admitting: Family Medicine

## 2016-01-21 ENCOUNTER — Encounter (HOSPITAL_COMMUNITY): Payer: Self-pay | Admitting: Nurse Practitioner

## 2016-01-21 ENCOUNTER — Emergency Department (HOSPITAL_COMMUNITY): Payer: Medicare Other

## 2016-01-21 DIAGNOSIS — Z90710 Acquired absence of both cervix and uterus: Secondary | ICD-10-CM | POA: Diagnosis not present

## 2016-01-21 DIAGNOSIS — S12110A Anterior displaced Type II dens fracture, initial encounter for closed fracture: Secondary | ICD-10-CM | POA: Diagnosis present

## 2016-01-21 DIAGNOSIS — S72001P Fracture of unspecified part of neck of right femur, subsequent encounter for closed fracture with malunion: Secondary | ICD-10-CM | POA: Diagnosis not present

## 2016-01-21 DIAGNOSIS — W19XXXA Unspecified fall, initial encounter: Secondary | ICD-10-CM | POA: Diagnosis present

## 2016-01-21 DIAGNOSIS — Z9841 Cataract extraction status, right eye: Secondary | ICD-10-CM

## 2016-01-21 DIAGNOSIS — Z79899 Other long term (current) drug therapy: Secondary | ICD-10-CM

## 2016-01-21 DIAGNOSIS — E890 Postprocedural hypothyroidism: Secondary | ICD-10-CM | POA: Diagnosis present

## 2016-01-21 DIAGNOSIS — S72011A Unspecified intracapsular fracture of right femur, initial encounter for closed fracture: Principal | ICD-10-CM | POA: Diagnosis present

## 2016-01-21 DIAGNOSIS — T148 Other injury of unspecified body region: Secondary | ICD-10-CM | POA: Diagnosis not present

## 2016-01-21 DIAGNOSIS — S12100A Unspecified displaced fracture of second cervical vertebra, initial encounter for closed fracture: Secondary | ICD-10-CM

## 2016-01-21 DIAGNOSIS — Z85850 Personal history of malignant neoplasm of thyroid: Secondary | ICD-10-CM

## 2016-01-21 DIAGNOSIS — Y92009 Unspecified place in unspecified non-institutional (private) residence as the place of occurrence of the external cause: Secondary | ICD-10-CM

## 2016-01-21 DIAGNOSIS — Z820 Family history of epilepsy and other diseases of the nervous system: Secondary | ICD-10-CM

## 2016-01-21 DIAGNOSIS — S72001D Fracture of unspecified part of neck of right femur, subsequent encounter for closed fracture with routine healing: Secondary | ICD-10-CM | POA: Diagnosis not present

## 2016-01-21 DIAGNOSIS — E440 Moderate protein-calorie malnutrition: Secondary | ICD-10-CM | POA: Diagnosis present

## 2016-01-21 DIAGNOSIS — Z96641 Presence of right artificial hip joint: Secondary | ICD-10-CM | POA: Diagnosis not present

## 2016-01-21 DIAGNOSIS — D62 Acute posthemorrhagic anemia: Secondary | ICD-10-CM | POA: Diagnosis not present

## 2016-01-21 DIAGNOSIS — R339 Retention of urine, unspecified: Secondary | ICD-10-CM | POA: Diagnosis not present

## 2016-01-21 DIAGNOSIS — M62838 Other muscle spasm: Secondary | ICD-10-CM | POA: Diagnosis present

## 2016-01-21 DIAGNOSIS — M549 Dorsalgia, unspecified: Secondary | ICD-10-CM | POA: Diagnosis present

## 2016-01-21 DIAGNOSIS — D72829 Elevated white blood cell count, unspecified: Secondary | ICD-10-CM | POA: Diagnosis present

## 2016-01-21 DIAGNOSIS — Z6828 Body mass index (BMI) 28.0-28.9, adult: Secondary | ICD-10-CM | POA: Diagnosis not present

## 2016-01-21 DIAGNOSIS — I10 Essential (primary) hypertension: Secondary | ICD-10-CM | POA: Diagnosis present

## 2016-01-21 DIAGNOSIS — E039 Hypothyroidism, unspecified: Secondary | ICD-10-CM | POA: Diagnosis present

## 2016-01-21 DIAGNOSIS — Z961 Presence of intraocular lens: Secondary | ICD-10-CM | POA: Diagnosis present

## 2016-01-21 DIAGNOSIS — K219 Gastro-esophageal reflux disease without esophagitis: Secondary | ICD-10-CM | POA: Diagnosis present

## 2016-01-21 DIAGNOSIS — M51379 Other intervertebral disc degeneration, lumbosacral region without mention of lumbar back pain or lower extremity pain: Secondary | ICD-10-CM | POA: Diagnosis present

## 2016-01-21 DIAGNOSIS — E876 Hypokalemia: Secondary | ICD-10-CM | POA: Diagnosis present

## 2016-01-21 DIAGNOSIS — S12090A Other displaced fracture of first cervical vertebra, initial encounter for closed fracture: Secondary | ICD-10-CM | POA: Diagnosis not present

## 2016-01-21 DIAGNOSIS — S12190A Other displaced fracture of second cervical vertebra, initial encounter for closed fracture: Secondary | ICD-10-CM | POA: Diagnosis not present

## 2016-01-21 DIAGNOSIS — S72041A Displaced fracture of base of neck of right femur, initial encounter for closed fracture: Secondary | ICD-10-CM | POA: Diagnosis not present

## 2016-01-21 DIAGNOSIS — Z471 Aftercare following joint replacement surgery: Secondary | ICD-10-CM | POA: Diagnosis not present

## 2016-01-21 DIAGNOSIS — S299XXA Unspecified injury of thorax, initial encounter: Secondary | ICD-10-CM | POA: Diagnosis not present

## 2016-01-21 DIAGNOSIS — M5137 Other intervertebral disc degeneration, lumbosacral region: Secondary | ICD-10-CM | POA: Diagnosis present

## 2016-01-21 DIAGNOSIS — Z9842 Cataract extraction status, left eye: Secondary | ICD-10-CM

## 2016-01-21 DIAGNOSIS — H409 Unspecified glaucoma: Secondary | ICD-10-CM | POA: Diagnosis present

## 2016-01-21 DIAGNOSIS — S72001A Fracture of unspecified part of neck of right femur, initial encounter for closed fracture: Secondary | ICD-10-CM | POA: Diagnosis not present

## 2016-01-21 DIAGNOSIS — G8929 Other chronic pain: Secondary | ICD-10-CM | POA: Diagnosis present

## 2016-01-21 DIAGNOSIS — H3530 Unspecified macular degeneration: Secondary | ICD-10-CM | POA: Diagnosis present

## 2016-01-21 DIAGNOSIS — W19XXXD Unspecified fall, subsequent encounter: Secondary | ICD-10-CM | POA: Diagnosis not present

## 2016-01-21 DIAGNOSIS — E43 Unspecified severe protein-calorie malnutrition: Secondary | ICD-10-CM

## 2016-01-21 DIAGNOSIS — Z01810 Encounter for preprocedural cardiovascular examination: Secondary | ICD-10-CM | POA: Diagnosis not present

## 2016-01-21 DIAGNOSIS — Z66 Do not resuscitate: Secondary | ICD-10-CM | POA: Diagnosis present

## 2016-01-21 DIAGNOSIS — Z96649 Presence of unspecified artificial hip joint: Secondary | ICD-10-CM

## 2016-01-21 DIAGNOSIS — M25551 Pain in right hip: Secondary | ICD-10-CM | POA: Diagnosis not present

## 2016-01-21 DIAGNOSIS — S0990XA Unspecified injury of head, initial encounter: Secondary | ICD-10-CM | POA: Diagnosis not present

## 2016-01-21 LAB — BASIC METABOLIC PANEL
ANION GAP: 11 (ref 5–15)
BUN: 18 mg/dL (ref 6–20)
CHLORIDE: 97 mmol/L — AB (ref 101–111)
CO2: 26 mmol/L (ref 22–32)
Calcium: 8.6 mg/dL — ABNORMAL LOW (ref 8.9–10.3)
Creatinine, Ser: 0.91 mg/dL (ref 0.44–1.00)
GFR calc non Af Amer: 55 mL/min — ABNORMAL LOW (ref >60)
Glucose, Bld: 133 mg/dL — ABNORMAL HIGH (ref 65–99)
Potassium: 3.4 mmol/L — ABNORMAL LOW (ref 3.5–5.1)
Sodium: 134 mmol/L — ABNORMAL LOW (ref 135–145)

## 2016-01-21 LAB — URINALYSIS, ROUTINE W REFLEX MICROSCOPIC
Bilirubin Urine: NEGATIVE
Glucose, UA: NEGATIVE mg/dL
Ketones, ur: NEGATIVE mg/dL
LEUKOCYTES UA: NEGATIVE
NITRITE: NEGATIVE
PH: 6 (ref 5.0–8.0)
Protein, ur: NEGATIVE mg/dL
SPECIFIC GRAVITY, URINE: 1.011 (ref 1.005–1.030)

## 2016-01-21 LAB — CBC WITH DIFFERENTIAL/PLATELET
BASOS ABS: 0 10*3/uL (ref 0.0–0.1)
Basophils Relative: 0 %
Eosinophils Absolute: 0 10*3/uL (ref 0.0–0.7)
Eosinophils Relative: 0 %
HEMATOCRIT: 37.5 % (ref 36.0–46.0)
Hemoglobin: 12.1 g/dL (ref 12.0–15.0)
Lymphocytes Relative: 4 %
Lymphs Abs: 0.6 10*3/uL — ABNORMAL LOW (ref 0.7–4.0)
MCH: 30 pg (ref 26.0–34.0)
MCHC: 32.3 g/dL (ref 30.0–36.0)
MCV: 93.1 fL (ref 78.0–100.0)
MONOS PCT: 7 %
Monocytes Absolute: 1 10*3/uL (ref 0.1–1.0)
NEUTROS PCT: 89 %
Neutro Abs: 12.3 10*3/uL — ABNORMAL HIGH (ref 1.7–7.7)
Platelets: 288 10*3/uL (ref 150–400)
RBC: 4.03 MIL/uL (ref 3.87–5.11)
RDW: 13.8 % (ref 11.5–15.5)
WBC: 13.9 10*3/uL — AB (ref 4.0–10.5)

## 2016-01-21 LAB — PROTIME-INR
INR: 1.04 (ref 0.00–1.49)
PROTHROMBIN TIME: 13.4 s (ref 11.6–15.2)

## 2016-01-21 LAB — URINE MICROSCOPIC-ADD ON: Squamous Epithelial / LPF: NONE SEEN

## 2016-01-21 LAB — APTT: APTT: 36 s (ref 24–37)

## 2016-01-21 MED ORDER — DOCUSATE SODIUM 100 MG PO CAPS: 300.0000 mg | ORAL_CAPSULE | Freq: Every day | ORAL | Status: DC

## 2016-01-21 MED ORDER — MORPHINE SULFATE (PF) 2 MG/ML IV SOLN
2.0000 mg | INTRAVENOUS | Status: DC | PRN
  Administered 2016-01-21: 2 mg via INTRAVENOUS
  Filled 2016-01-21: qty 1

## 2016-01-21 MED ORDER — DOCUSATE SODIUM 100 MG PO CAPS: 300.0000 mg | ORAL_CAPSULE | Freq: Two times a day (BID) | ORAL | Status: DC | PRN

## 2016-01-21 MED ORDER — BRIMONIDINE TARTRATE 0.2 % OP SOLN
1.0000 [drp] | Freq: Two times a day (BID) | OPHTHALMIC | Status: DC
  Administered 2016-01-21 – 2016-01-26 (×10): 1 [drp] via OPHTHALMIC
  Filled 2016-01-21: qty 5

## 2016-01-21 MED ORDER — ONDANSETRON HCL 4 MG/2ML IJ SOLN
4.0000 mg | Freq: Three times a day (TID) | INTRAMUSCULAR | Status: DC | PRN
  Administered 2016-01-21 – 2016-01-23 (×2): 4 mg via INTRAVENOUS
  Filled 2016-01-21: qty 2

## 2016-01-21 MED ORDER — LATANOPROST 0.005 % OP SOLN
1.0000 [drp] | Freq: Every day | OPHTHALMIC | Status: DC
  Administered 2016-01-21 – 2016-01-25 (×5): 1 [drp] via OPHTHALMIC
  Filled 2016-01-21: qty 2.5

## 2016-01-21 MED ORDER — POTASSIUM CHLORIDE 20 MEQ/15ML (10%) PO SOLN
20.0000 meq | Freq: Once | ORAL | Status: AC
  Administered 2016-01-21: 20 meq via ORAL
  Filled 2016-01-21: qty 15

## 2016-01-21 MED ORDER — METHOCARBAMOL 1000 MG/10ML IJ SOLN
500.0000 mg | Freq: Four times a day (QID) | INTRAVENOUS | Status: DC | PRN
  Filled 2016-01-21: qty 5

## 2016-01-21 MED ORDER — MORPHINE SULFATE (PF) 4 MG/ML IV SOLN
4.0000 mg | INTRAVENOUS | Status: DC | PRN
  Administered 2016-01-21 – 2016-01-23 (×6): 4 mg via INTRAVENOUS
  Filled 2016-01-21 (×6): qty 1

## 2016-01-21 MED ORDER — GABAPENTIN 300 MG PO CAPS
600.0000 mg | ORAL_CAPSULE | Freq: Three times a day (TID) | ORAL | Status: DC
  Administered 2016-01-21 – 2016-01-26 (×13): 600 mg via ORAL
  Filled 2016-01-21 (×13): qty 2

## 2016-01-21 MED ORDER — PROPRANOLOL HCL 20 MG PO TABS
20.0000 mg | ORAL_TABLET | Freq: Every day | ORAL | Status: DC
  Administered 2016-01-23 – 2016-01-26 (×4): 20 mg via ORAL
  Filled 2016-01-21 (×5): qty 1

## 2016-01-21 MED ORDER — CHLORHEXIDINE GLUCONATE 4 % EX LIQD
60.0000 mL | Freq: Once | CUTANEOUS | Status: AC
  Administered 2016-01-23: 4 via TOPICAL

## 2016-01-21 MED ORDER — CLINDAMYCIN PHOSPHATE 900 MG/50ML IV SOLN
900.0000 mg | INTRAVENOUS | Status: AC
  Administered 2016-01-23: 900 mg via INTRAVENOUS
  Filled 2016-01-21: qty 50

## 2016-01-21 MED ORDER — METHOCARBAMOL 500 MG PO TABS
500.0000 mg | ORAL_TABLET | Freq: Four times a day (QID) | ORAL | Status: DC | PRN
  Administered 2016-01-21 – 2016-01-25 (×6): 500 mg via ORAL
  Filled 2016-01-21 (×6): qty 1

## 2016-01-21 MED ORDER — PANTOPRAZOLE SODIUM 40 MG PO TBEC: 40.0000 mg | DELAYED_RELEASE_TABLET | Freq: Every day | ORAL | Status: DC | PRN

## 2016-01-21 MED ORDER — SODIUM CHLORIDE 0.9 % IV SOLN
INTRAVENOUS | Status: DC
  Administered 2016-01-21: 75 mL/h via INTRAVENOUS

## 2016-01-21 MED ORDER — MORPHINE SULFATE (PF) 4 MG/ML IV SOLN
4.0000 mg | Freq: Once | INTRAVENOUS | Status: AC
Start: 2016-01-21 — End: 2016-01-21
  Administered 2016-01-21: 4 mg via INTRAVENOUS
  Filled 2016-01-21: qty 1

## 2016-01-21 MED ORDER — POVIDONE-IODINE 10 % EX SWAB: 2.0000 "application " | Freq: Once | CUTANEOUS | Status: DC

## 2016-01-21 MED ORDER — OXYMETAZOLINE HCL 0.05 % NA SOLN
1.0000 | Freq: Two times a day (BID) | NASAL | Status: DC
  Administered 2016-01-21 – 2016-01-26 (×7): 1 via NASAL
  Filled 2016-01-21: qty 15

## 2016-01-21 MED ORDER — MORPHINE SULFATE (PF) 4 MG/ML IV SOLN
4.0000 mg | Freq: Once | INTRAVENOUS | Status: AC
  Administered 2016-01-21: 4 mg via INTRAVENOUS
  Filled 2016-01-21: qty 1

## 2016-01-21 MED ORDER — HYDRALAZINE HCL 20 MG/ML IJ SOLN: 5.0000 mg | INTRAMUSCULAR | Status: DC | PRN

## 2016-01-21 MED ORDER — LEVOTHYROXINE SODIUM 75 MCG PO TABS
150.0000 ug | ORAL_TABLET | Freq: Every day | ORAL | Status: DC
  Administered 2016-01-22: 150 ug via ORAL
  Filled 2016-01-21: qty 2
  Filled 2016-01-21: qty 1

## 2016-01-21 MED ORDER — HEPARIN SODIUM (PORCINE) 5000 UNIT/ML IJ SOLN
5000.0000 [IU] | Freq: Three times a day (TID) | INTRAMUSCULAR | Status: AC
  Administered 2016-01-21 – 2016-01-22 (×2): 5000 [IU] via SUBCUTANEOUS
  Filled 2016-01-21 (×2): qty 1

## 2016-01-21 MED ORDER — POLYETHYLENE GLYCOL 3350 17 G PO PACK
17.0000 g | PACK | Freq: Every day | ORAL | Status: DC | PRN
  Filled 2016-01-21: qty 1

## 2016-01-21 MED ORDER — DORZOLAMIDE HCL-TIMOLOL MAL 2-0.5 % OP SOLN
1.0000 [drp] | Freq: Two times a day (BID) | OPHTHALMIC | Status: DC
  Administered 2016-01-21 – 2016-01-26 (×10): 1 [drp] via OPHTHALMIC
  Filled 2016-01-21: qty 10

## 2016-01-21 MED ORDER — METHOCARBAMOL 1000 MG/10ML IJ SOLN: 500.0000 mg | Freq: Four times a day (QID) | INTRAVENOUS | Status: DC | PRN

## 2016-01-21 MED ORDER — OXYCODONE HCL 5 MG PO TABS
15.0000 mg | ORAL_TABLET | ORAL | Status: DC | PRN
  Administered 2016-01-21 – 2016-01-24 (×8): 15 mg via ORAL
  Filled 2016-01-21 (×8): qty 3

## 2016-01-21 NOTE — ED Notes (Signed)
Pt arrived to [facility] via [facility] after suffering a fall and now complaining of right hip pain. She tripped on her dog, denies hitting her head. Right hip is shortened and rotated. She received 250mg  of fentanyl with [facility]. Complains pain is 6/10.

## 2016-01-21 NOTE — ED Provider Notes (Signed)
CSN: [id number]     Arrival date & time 01/21/16  1736 History   First MD Initiated Contact with Patient 01/21/16 1739     Chief Complaint  Patient presents with  . Fall    right hip     (Consider location/radiation/quality/duration/timing/severity/associated sxs/prior Treatment) HPI 82 year old female who presents after fall. She has a history of hypertension and hypothyroid thyroidism. Does not take anticoagulation. States that she was walking to the bathroom from the kitchen table tonight, when she tripped over her dog. She fell on her right hip and onto her head.. Complains of right hip pain and headache. Denies any loss of consciousness, focal numbness or weakness, nausea or vomiting, confusion, chest pain or difficulty breathing, or abdominal pain. Past Medical History  Diagnosis Date  . Angina   . Headache(784.0)   . Hypoglycemia   . Fracture 2008    back  . Macular degeneration of both eyes   . Glaucoma   . Hypothyroidism   . Blood transfusion   . Anemia   . GERD (gastroesophageal reflux disease)   . H/O hiatal hernia   . Arthritis   . Chronic back pain   . Thyroid cancer ([place]) 1986  . Shortness of breath 11/08/11    "here lately, all the time"  . Exertional dyspnea 01/17/12  . Osteomyelitis ([place]) 11/2011    right knee  . Hypertension   . Pneumonia 01/2010  . Metabolic acidemia [id number]   Past Surgical History  Procedure Laterality Date  . Thyroidectomy  1986  . Shoulder arthroscopy w/ rotator cuff repair    . Trigger finger release      right  thumb  . Abdominal hysterectomy  1963  . Carpal tunnel release      bilaterally  . Tonsillectomy and adenoidectomy    . Appendectomy    . Dilation and curettage of uterus    . Cataract extraction w/ intraocular lens  implant, bilateral    . Joint replacement  2005, 2006, 2012, 11/10/11    left; left; right; right; right  . Goiter removed  1980's    "when I was young"  . Fracture surgery      "broke back"  . Hand or   09/2011    "left hand; bones were coming together"  . I&d knee with poly exchange  11/10/2011    Procedure: IRRIGATION AND DEBRIDEMENT KNEE WITH POLY EXCHANGE;  Surgeon: Yvette Rack., MD;  Location: [facility];  Service: Orthopedics;  Laterality: Right;  . Post polyethelene exchange  11/09/11    RLE  . Back surgery      "3 back OR's;"; S/P "fractured back 2008"  . Peripherally inserted central catheter insertion  11/2011    "removed 12/2011; LUE"  . Total knee revision  04/28/2012    Procedure: TOTAL KNEE REVISION;  Surgeon: Rudean Haskell, MD;  Location: [facility];  Service: Orthopedics;  Laterality: Right;   History reviewed. No pertinent family history. Social History  Substance Use Topics  . Smoking status: Never Smoker   . Smokeless tobacco: Never Used  . Alcohol Use: No   OB History    No data available     Review of Systems 10/14 systems reviewed and are negative other than those stated in the HPI    Allergies  Demerol; Penicillins; Corticosteroids; Lorazepam; Tetracyclines & related; and Vancomycin  Home Medications   Prior to Admission medications   Medication Sig Start Date End Date Taking? Authorizing Provider  brimonidine (  ALPHAGAN) 0.2 % ophthalmic solution Place 1 drop into both eyes 2 (two) times daily. 01/12/16  Yes Historical Provider, MD  docusate sodium (COLACE) 100 MG capsule Take 300 mg by mouth at bedtime.   Yes Historical Provider, MD  dorzolamide-timolol (COSOPT) 22.3-6.8 MG/ML ophthalmic solution Place 1 drop into both eyes 2 (two) times daily. 01/12/16  Yes Historical Provider, MD  gabapentin (NEURONTIN) 300 MG capsule Take 600 mg by mouth 3 (three) times daily.    Yes Historical Provider, MD  latanoprost (XALATAN) 0.005 % ophthalmic solution Place 1 drop into both eyes at bedtime.  01/05/13  Yes Historical Provider, MD  levothyroxine (SYNTHROID, LEVOTHROID) 150 MCG tablet Take 150 mcg by mouth daily before breakfast.   Yes Historical Provider, MD  ondansetron  (ZOFRAN) 4 MG tablet Take 4 mg by mouth 2 (two) times daily as needed for nausea or vomiting.   Yes Historical Provider, MD  oxyCODONE (ROXICODONE) 15 MG immediate release tablet Take 15 mg by mouth every 4 (four) hours as needed for pain.  01/17/16  Yes Historical Provider, MD  polyethylene glycol (MIRALAX / GLYCOLAX) packet Take 17 g by mouth 2 (two) times daily. Patient taking differently: Take 17 g by mouth daily as needed for mild constipation or moderate constipation.  02/20/14  Yes Shanker Kristeen Mans, MD  propranolol (INDERAL) 20 MG tablet Take 20 mg by mouth daily. Takes for migraines.   Yes Historical Provider, MD  saccharomyces boulardii (FLORASTOR) 250 MG capsule Take 1 capsule (250 mg total) by mouth 2 (two) times daily. Patient not taking: Reported on 01/21/2016 02/20/14   Jonetta Osgood, MD  senna (SENOKOT) 8.6 MG TABS tablet Take 1 tablet (8.6 mg total) by mouth at bedtime. Patient not taking: Reported on 01/21/2016 02/20/14   Jonetta Osgood, MD   BP 184/90 mmHg  Pulse 92  Temp(Src) 98.4 F (36.9 C) (Oral)  Resp 18  SpO2 94% Physical Exam Physical Exam  Nursing note and vitals reviewed. Constitutional: Well developed, well nourished, non-toxic, and in no acute distress Head: Normocephalic and atraumatic.  Mouth/Throat: Oropharynx is clear and moist.  Neck: Tenderness involving c-spine diffusely without step offs Cardiovascular: Normal rate and regular rhythm.  No chest wall tenderness. +2 DP pulses bilaterally Pulmonary/Chest: Effort normal and breath sounds normal.  Abdominal: Soft. There is no tenderness. There is no rebound and no guarding.  Musculoskeletal: Right lower extremity is externally rotated and shortened.  Neurological: Alert, no facial droop, fluent speech, moves all extremities symmetrically, reports diminished sensation over toes of the right foot Skin: Skin is warm and dry.  Psychiatric: Cooperative  ED Course  Procedures (including critical care  time) Labs Review Labs Reviewed  CBC WITH DIFFERENTIAL/PLATELET - Abnormal; Notable for the following:    WBC 13.9 (*)    Neutro Abs 12.3 (*)    Lymphs Abs 0.6 (*)    All other components within normal limits  BASIC METABOLIC PANEL - Abnormal; Notable for the following:    Sodium 134 (*)    Potassium 3.4 (*)    Chloride 97 (*)    Glucose, Bld 133 (*)    Calcium 8.6 (*)    GFR calc non Af Amer 55 (*)    All other components within normal limits  URINALYSIS, ROUTINE W REFLEX MICROSCOPIC (NOT AT [facility]) - Abnormal; Notable for the following:    APPearance CLOUDY (*)    Hgb urine dipstick TRACE (*)    All other components within normal limits  URINE  MICROSCOPIC-ADD ON - Abnormal; Notable for the following:    Bacteria, UA MANY (*)    All other components within normal limits    Imaging Review Dg Chest 1 View  01/21/2016  CLINICAL DATA:  Fall, right hip pain. EXAM: CHEST 1 VIEW COMPARISON:  Chest x-rays dated 02/18/2014 and 01/2015 2013. FINDINGS: Cardiomediastinal silhouette is stable in size and configuration. Lungs appear hyperexpanded. Lungs are clear. No pleural effusion or pneumothorax seen. Osseous structures about the chest are unremarkable. IMPRESSION: 1. Lungs appear hyperexpanded suggesting COPD/emphysema. 2. Lungs are clear and there is no evidence of acute cardiopulmonary abnormality. Electronically Signed   By: Franki Cabot M.D.   On: 01/21/2016 18:49   Ct Head Wo Contrast  01/21/2016  CLINICAL DATA:  Fall, right hip pain. EXAM: CT HEAD WITHOUT CONTRAST CT CERVICAL SPINE WITHOUT CONTRAST TECHNIQUE: Multidetector CT imaging of the head and cervical spine was performed following the standard protocol without intravenous contrast. Multiplanar CT image reconstructions of the cervical spine were also generated. COMPARISON:  None. FINDINGS: CT HEAD FINDINGS There is mild generalized brain atrophy with commensurate dilatation of the ventricles and sulci. Mild chronic small vessel  ischemic change noted within the deep periventricular white matter regions bilaterally. There is no mass, hemorrhage, edema or other evidence of acute parenchymal abnormality. No extra-axial hemorrhage. No skull fracture. Visualized upper paranasal sinuses are clear. CT CERVICAL SPINE FINDINGS Extensive degenerative change throughout the cervical spine. There is a slightly displaced fracture within the odontoid process, with slight posterior tilt of the odontoid, with surrounding erosive change that may indicate chronic fracture displacement. There is also a nondisplaced fracture within the left lamina of C1 which is of uncertain age. Fracture diastasis, with small adjacent bony fragments, also noted within the right lamina of C1 which is also of uncertain age but more likely chronic, possibly congenital. No other fracture line or displaced fracture fragment is identified within the cervical spine. Facet joints of the C2 through T1 are normally aligned. Atherosclerotic calcifications are noted at each carotid bulb region. Paravertebral soft tissues are otherwise unremarkable. IMPRESSION: 1. Slightly displaced fracture within the C2 odontoid process, with surrounding erosive change that may indicate chronic fracture displacement. There is a slight posterior tilt of the odontoid and up to 3 mm cortical displacement anteriorly. No associated central canal narrowing. 2. Additional nondisplaced fracture within the left lamina of the C1 vertebral body, of uncertain age. Fracture diastasis within the right lamina of C1 is also of uncertain age but has a more chronic appearance, and may be congenital. 3. No acute intracranial findings. No intracranial mass, hemorrhage or edema. These results were called by telephone at the time of interpretation on 01/21/2016 at 7:15 pm to Dr. Brantley Stage , who verbally acknowledged these results. Electronically Signed   By: Franki Cabot M.D.   On: 01/21/2016 19:17   Ct Cervical Spine Wo  Contrast  01/21/2016  CLINICAL DATA:  Fall, right hip pain. EXAM: CT HEAD WITHOUT CONTRAST CT CERVICAL SPINE WITHOUT CONTRAST TECHNIQUE: Multidetector CT imaging of the head and cervical spine was performed following the standard protocol without intravenous contrast. Multiplanar CT image reconstructions of the cervical spine were also generated. COMPARISON:  None. FINDINGS: CT HEAD FINDINGS There is mild generalized brain atrophy with commensurate dilatation of the ventricles and sulci. Mild chronic small vessel ischemic change noted within the deep periventricular white matter regions bilaterally. There is no mass, hemorrhage, edema or other evidence of acute parenchymal abnormality. No extra-axial hemorrhage. No  skull fracture. Visualized upper paranasal sinuses are clear. CT CERVICAL SPINE FINDINGS Extensive degenerative change throughout the cervical spine. There is a slightly displaced fracture within the odontoid process, with slight posterior tilt of the odontoid, with surrounding erosive change that may indicate chronic fracture displacement. There is also a nondisplaced fracture within the left lamina of C1 which is of uncertain age. Fracture diastasis, with small adjacent bony fragments, also noted within the right lamina of C1 which is also of uncertain age but more likely chronic, possibly congenital. No other fracture line or displaced fracture fragment is identified within the cervical spine. Facet joints of the C2 through T1 are normally aligned. Atherosclerotic calcifications are noted at each carotid bulb region. Paravertebral soft tissues are otherwise unremarkable. IMPRESSION: 1. Slightly displaced fracture within the C2 odontoid process, with surrounding erosive change that may indicate chronic fracture displacement. There is a slight posterior tilt of the odontoid and up to 3 mm cortical displacement anteriorly. No associated central canal narrowing. 2. Additional nondisplaced fracture within  the left lamina of the C1 vertebral body, of uncertain age. Fracture diastasis within the right lamina of C1 is also of uncertain age but has a more chronic appearance, and may be congenital. 3. No acute intracranial findings. No intracranial mass, hemorrhage or edema. These results were called by telephone at the time of interpretation on 01/21/2016 at 7:15 pm to Dr. Brantley Stage , who verbally acknowledged these results. Electronically Signed   By: Franki Cabot M.D.   On: 01/21/2016 19:17   Dg Hip Unilat  With Pelvis 2-3 Views Right  01/21/2016  CLINICAL DATA:  Fall, right hip pain. EXAM: DG HIP (WITH OR WITHOUT PELVIS) 2-3V RIGHT COMPARISON:  None. FINDINGS: Markedly displaced fracture within the subcapital region of the right femoral neck. Right femoral head remains well positioned relative to the acetabulum. IMPRESSION: Significantly displaced fracture of the right femoral neck, subcapital region. Electronically Signed   By: Franki Cabot M.D.   On: 01/21/2016 18:48   I have personally reviewed and evaluated these images and lab results as part of my medical decision-making.   EKG Interpretation None      MDM   Final diagnoses:  Closed right hip fracture, initial encounter ([place])  Odontoid fracture, closed, initial encounter ([place])  Fall, initial encounter    82 year old female who presents after mechanical fall.  she is well-appearing in no acute distress. Primarily complaining of right hip pain which appears shortened and externally rotated. X-ray reveals closed right femoral neck fracture. Discussed with Dr. Wynelle Link who will operate on Monday. With some cervical spine  tenderness noted it as well on exam but neuro-intact.  CT head is negative for injury. CT cervical spine reveals odontoid fracture with possible C1 ring fracture that could be acute vs chronic. However patient without prior history of cervical spine fracture and currently having neck pain. Cervical collar placed. Discussed with  Dr. Joya Salm from neurosurgery who recommended soft cervical collar and recommended outpatient follow-up within 2-3 weeks. He did not feel that injury with operative in nature. I subsequently discussed with Dr. Blaine Hamper who will admit to [facility] for further management.   Forde Dandy, MD 01/21/16 2105

## 2016-01-21 NOTE — ED Notes (Signed)
Bed: WA10 Expected date: 01/21/16 Expected time: 5:22 PM Means of arrival: Ambulance Comments: Fall, rt hip pain

## 2016-01-21 NOTE — ED Notes (Signed)
Attempted use of female urinal due to patient mobility limited due to pain.  Unable to obtain urine at this time.

## 2016-01-21 NOTE — H&P (Signed)
History and Physical    Sheila LADUKE [id number] DOB: 09/29/1924 DOA: 01/21/2016  Referring MD/NP/PA:   PCP: Merrilee Seashore, MD   Patient coming from:  The patient is coming from home.  At baseline, he is independent for most of his ADL.    Chief Complaint: Right hip pain and neck pain after fall  HPI: [name] is a 82 y.o. female with medical history significant of hypertension, glaucoma, macular degeneration, chronic back pain, GERD, hypothyroidism, thyroid cancer, who presents with right hip pain and neck pain after fall.  Patient states that she had a fall when was getting up from her table, turned and tripped on her dog at about 1:30 PM. She landed on the right side. She did not have any prodromal symptoms leading to the fall. She developed pain over right hip and neck.  No LOC. Her right hip is severe, 10 out of 10 in severity, sharp, nonradiating, constant. Her neck pain is moderate, constant, 6 out of 10 in severity, nonradiating. She has right leg muscle spasm, but no numbness or tingling in her legs. Patient does not have unilateral weakness, fever, chills, chest pain, shortness breath, cough, nausea, vomiting, abdominal pain or symptoms of a UTI. She has loose stool which she attributes to laxatives she is taking.  ED Course: pt was found to have WBC 13.9, temperature normal, no tachycardia, potassium 3.4, creatinine 0.91, chest x-ray showed COPD change, but no infiltration. CT head is negative for intracranial abnormalities. CT of C-spine showed slightly displaced fracture within the C2 odontoid process, with surrounding erosive change that may indicate chronic fracturedisplacement. There is a slight posterior tilt of the odontoid and up to 3 mm cortical displacement anteriorly. No associated central canal narrowing; additional nondisplaced fracture within the left lamina of the C1 vertebral body, of uncertain age. Fracture diastasis within the right lamina of C1 is also  of uncertain age but has a more chronic appearance, and may be congenital. X-Ray of right hip/pelvis showed significantly displaced fracture of the right femoral neck, subcapital region. Pt is admitted for further evaluation and treatment. Orthopedic surgeon and neurosurgeon were consulted by EP.  Review of Systems:   General: no fevers, chills, no changes in body weight, has fatigue HEENT: no blurry vision, hearing changes or sore throat Pulm: no dyspnea, coughing, wheezing CV: no chest pain, no palpitations Abd: no nausea, vomiting, abdominal pain, diarrhea, constipation GU: no dysuria, burning on urination, increased urinary frequency, hematuria  Ext: no leg edema Neuro: no unilateral weakness, numbness, or tingling, no vision change or hearing loss Skin: no rash MSK: has pain over right hip and neck Heme: No easy bruising.  Travel history: No recent long distant travel.  Allergy:  Allergies  Allergen Reactions  . Demerol Shortness Of Breath  . Penicillins Swelling    "in the face" Has patient had a PCN reaction causing immediate rash, facial/tongue/throat swelling, SOB or lightheadedness with hypotension: yes Has patient had a PCN reaction causing severe rash involving mucus membranes or skin necrosis: no Has patient had a PCN reaction that required hospitalization: no  Has patient had a PCN reaction occurring within the last 10 years: no If all of the above answers are "NO", then may proceed with Cephalosporin use.   . Corticosteroids Other (See Comments)    DIZZINESS, HEADACHE  . Lorazepam Anxiety  . Tetracyclines & Related Rash  . Vancomycin Rash    Past Medical History  Diagnosis Date  . Angina   .  Headache(784.0)   . Hypoglycemia   . Fracture 2008    back  . Macular degeneration of both eyes   . Glaucoma   . Hypothyroidism   . Blood transfusion   . Anemia   . GERD (gastroesophageal reflux disease)   . H/O hiatal hernia   . Arthritis   . Chronic back pain     . Thyroid cancer ([place]) 1986  . Shortness of breath 11/08/11    "here lately, all the time"  . Exertional dyspnea 01/17/12  . Osteomyelitis ([place]) 11/2011    right knee  . Hypertension   . Pneumonia 01/2010  . Metabolic acidemia 123456    Past Surgical History  Procedure Laterality Date  . Thyroidectomy  1986  . Shoulder arthroscopy w/ rotator cuff repair    . Trigger finger release      right  thumb  . Abdominal hysterectomy  1963  . Carpal tunnel release      bilaterally  . Tonsillectomy and adenoidectomy    . Appendectomy    . Dilation and curettage of uterus    . Cataract extraction w/ intraocular lens  implant, bilateral    . Joint replacement  2005, 2006, 2012, 11/10/11    left; left; right; right; right  . Goiter removed  1980's    "when I was young"  . Fracture surgery      "broke back"  . Hand or  09/2011    "left hand; bones were coming together"  . I&d knee with poly exchange  11/10/2011    Procedure: IRRIGATION AND DEBRIDEMENT KNEE WITH POLY EXCHANGE;  Surgeon: Yvette Rack., MD;  Location: [facility];  Service: Orthopedics;  Laterality: Right;  . Post polyethelene exchange  11/09/11    RLE  . Back surgery      "3 back OR's;"; S/P "fractured back 2008"  . Peripherally inserted central catheter insertion  11/2011    "removed 12/2011; LUE"  . Total knee revision  04/28/2012    Procedure: TOTAL KNEE REVISION;  Surgeon: Rudean Haskell, MD;  Location: [facility];  Service: Orthopedics;  Laterality: Right;    Social History:  reports that she has never smoked. She has never used smokeless tobacco. She reports that she does not drink alcohol or use illicit drugs.  Family History:  Family History  Problem Relation Age of Onset  . Intracerebral hemorrhage Mother   . Stroke Father   . Parkinson's disease Brother      Prior to Admission medications   Medication Sig Start Date End Date Taking? Authorizing Provider  brimonidine (ALPHAGAN) 0.2 % ophthalmic solution Place 1 drop  into both eyes 2 (two) times daily. 01/12/16  Yes Historical Provider, MD  docusate sodium (COLACE) 100 MG capsule Take 300 mg by mouth at bedtime.   Yes Historical Provider, MD  dorzolamide-timolol (COSOPT) 22.3-6.8 MG/ML ophthalmic solution Place 1 drop into both eyes 2 (two) times daily. 01/12/16  Yes Historical Provider, MD  gabapentin (NEURONTIN) 300 MG capsule Take 600 mg by mouth 3 (three) times daily.    Yes Historical Provider, MD  latanoprost (XALATAN) 0.005 % ophthalmic solution Place 1 drop into both eyes at bedtime.  01/05/13  Yes Historical Provider, MD  levothyroxine (SYNTHROID, LEVOTHROID) 150 MCG tablet Take 150 mcg by mouth daily before breakfast.   Yes Historical Provider, MD  ondansetron (ZOFRAN) 4 MG tablet Take 4 mg by mouth 2 (two) times daily as needed for nausea or vomiting.   Yes Historical  Provider, MD  oxyCODONE (ROXICODONE) 15 MG immediate release tablet Take 15 mg by mouth every 4 (four) hours as needed for pain.  01/17/16  Yes Historical Provider, MD  polyethylene glycol (MIRALAX / GLYCOLAX) packet Take 17 g by mouth 2 (two) times daily. Patient taking differently: Take 17 g by mouth daily as needed for mild constipation or moderate constipation.  02/20/14  Yes Shanker Kristeen Mans, MD  propranolol (INDERAL) 20 MG tablet Take 20 mg by mouth daily. Takes for migraines.   Yes Historical Provider, MD  saccharomyces boulardii (FLORASTOR) 250 MG capsule Take 1 capsule (250 mg total) by mouth 2 (two) times daily. Patient not taking: Reported on 01/21/2016 02/20/14   Jonetta Osgood, MD  senna (SENOKOT) 8.6 MG TABS tablet Take 1 tablet (8.6 mg total) by mouth at bedtime. Patient not taking: Reported on 01/21/2016 02/20/14   Jonetta Osgood, MD    Physical Exam: Filed Vitals:   01/21/16 1744 01/21/16 1759  BP:  184/90  Pulse:  92  Temp:  98.4 F (36.9 C)  TempSrc:  Oral  Resp:  18  SpO2: 95% 94%   General: Not in acute distress HEENT:       Eyes: PERRL, EOMI, no scleral  icterus.       ENT: No discharge from the ears and nose, no pharynx injection, no tonsillar enlargement.        Neck: No JVD, no bruit, no mass felt. Heme: No neck lymph node enlargement. Cardiac: S1/S2, RRR, No murmurs, No gallops or rubs. Pulm:  No rales, wheezing, rhonchi or rubs. Abd: Soft, nondistended, nontender, no rebound pain, no organomegaly, BS present. GU: No hematuria Ext: No pitting leg edema bilaterally. 2+DP/PT pulse bilaterally. Musculoskeletal: has tenderness over neck and right hip. Right leg is shortened and externally rotated. Skin: No rashes.  Neuro: Alert, oriented X3, cranial nerves II-XII grossly intact, moves all extremities. Psych: Patient is not psychotic, no suicidal or hemocidal ideation.  Labs on Admission: I have personally reviewed following labs and imaging studies  CBC:  Recent Labs Lab 01/21/16 1829  WBC 13.9*  NEUTROABS 12.3*  HGB 12.1  HCT 37.5  MCV 93.1  PLT 123XX123   Basic Metabolic Panel:  Recent Labs Lab 01/21/16 1829  NA 134*  K 3.4*  CL 97*  CO2 26  GLUCOSE 133*  BUN 18  CREATININE 0.91  CALCIUM 8.6*   GFR: CrCl cannot be calculated (Unknown ideal weight.). Liver Function Tests: No results for input(s): AST, ALT, ALKPHOS, BILITOT, PROT, ALBUMIN in the last 168 hours. No results for input(s): LIPASE, AMYLASE in the last 168 hours. No results for input(s): AMMONIA in the last 168 hours. Coagulation Profile: No results for input(s): INR, PROTIME in the last 168 hours. Cardiac Enzymes: No results for input(s): CKTOTAL, CKMB, CKMBINDEX, TROPONINI in the last 168 hours. BNP (last 3 results) No results for input(s): PROBNP in the last 8760 hours. HbA1C: No results for input(s): HGBA1C in the last 72 hours. CBG: No results for input(s): GLUCAP in the last 168 hours. Lipid Profile: No results for input(s): CHOL, HDL, LDLCALC, TRIG, CHOLHDL, LDLDIRECT in the last 72 hours. Thyroid Function Tests: No results for input(s): TSH,  T4TOTAL, FREET4, T3FREE, THYROIDAB in the last 72 hours. Anemia Panel: No results for input(s): VITAMINB12, FOLATE, FERRITIN, TIBC, IRON, RETICCTPCT in the last 72 hours. Urine analysis:    Component Value Date/Time   COLORURINE YELLOW 01/21/2016 2040   APPEARANCEUR CLOUDY* 01/21/2016 2040   LABSPEC 1.011  01/21/2016 2040   PHURINE 6.0 01/21/2016 2040   GLUCOSEU NEGATIVE 01/21/2016 2040   HGBUR TRACE* 01/21/2016 2040   BILIRUBINUR NEGATIVE 01/21/2016 2040   KETONESUR NEGATIVE 01/21/2016 2040   PROTEINUR NEGATIVE 01/21/2016 2040   UROBILINOGEN 1.0 02/18/2014 1617   NITRITE NEGATIVE 01/21/2016 2040   LEUKOCYTESUR NEGATIVE 01/21/2016 2040   Sepsis Labs: @LABRCNTIP (procalcitonin:4,lacticidven:4) )No results found for this or any previous visit (from the past 240 hour(s)).   Radiological Exams on Admission: Dg Chest 1 View  01/21/2016  CLINICAL DATA:  Fall, right hip pain. EXAM: CHEST 1 VIEW COMPARISON:  Chest x-rays dated 02/18/2014 and 01/2015 2013. FINDINGS: Cardiomediastinal silhouette is stable in size and configuration. Lungs appear hyperexpanded. Lungs are clear. No pleural effusion or pneumothorax seen. Osseous structures about the chest are unremarkable. IMPRESSION: 1. Lungs appear hyperexpanded suggesting COPD/emphysema. 2. Lungs are clear and there is no evidence of acute cardiopulmonary abnormality. Electronically Signed   By: Franki Cabot M.D.   On: 01/21/2016 18:49   Ct Head Wo Contrast  01/21/2016  CLINICAL DATA:  Fall, right hip pain. EXAM: CT HEAD WITHOUT CONTRAST CT CERVICAL SPINE WITHOUT CONTRAST TECHNIQUE: Multidetector CT imaging of the head and cervical spine was performed following the standard protocol without intravenous contrast. Multiplanar CT image reconstructions of the cervical spine were also generated. COMPARISON:  None. FINDINGS: CT HEAD FINDINGS There is mild generalized brain atrophy with commensurate dilatation of the ventricles and sulci. Mild chronic small  vessel ischemic change noted within the deep periventricular white matter regions bilaterally. There is no mass, hemorrhage, edema or other evidence of acute parenchymal abnormality. No extra-axial hemorrhage. No skull fracture. Visualized upper paranasal sinuses are clear. CT CERVICAL SPINE FINDINGS Extensive degenerative change throughout the cervical spine. There is a slightly displaced fracture within the odontoid process, with slight posterior tilt of the odontoid, with surrounding erosive change that may indicate chronic fracture displacement. There is also a nondisplaced fracture within the left lamina of C1 which is of uncertain age. Fracture diastasis, with small adjacent bony fragments, also noted within the right lamina of C1 which is also of uncertain age but more likely chronic, possibly congenital. No other fracture line or displaced fracture fragment is identified within the cervical spine. Facet joints of the C2 through T1 are normally aligned. Atherosclerotic calcifications are noted at each carotid bulb region. Paravertebral soft tissues are otherwise unremarkable. IMPRESSION: 1. Slightly displaced fracture within the C2 odontoid process, with surrounding erosive change that may indicate chronic fracture displacement. There is a slight posterior tilt of the odontoid and up to 3 mm cortical displacement anteriorly. No associated central canal narrowing. 2. Additional nondisplaced fracture within the left lamina of the C1 vertebral body, of uncertain age. Fracture diastasis within the right lamina of C1 is also of uncertain age but has a more chronic appearance, and may be congenital. 3. No acute intracranial findings. No intracranial mass, hemorrhage or edema. These results were called by telephone at the time of interpretation on 01/21/2016 at 7:15 pm to Dr. Brantley Stage , who verbally acknowledged these results. Electronically Signed   By: Franki Cabot M.D.   On: 01/21/2016 19:17   Ct Cervical Spine  Wo Contrast  01/21/2016  CLINICAL DATA:  Fall, right hip pain. EXAM: CT HEAD WITHOUT CONTRAST CT CERVICAL SPINE WITHOUT CONTRAST TECHNIQUE: Multidetector CT imaging of the head and cervical spine was performed following the standard protocol without intravenous contrast. Multiplanar CT image reconstructions of the cervical spine were also generated. COMPARISON:  None. FINDINGS: CT HEAD FINDINGS There is mild generalized brain atrophy with commensurate dilatation of the ventricles and sulci. Mild chronic small vessel ischemic change noted within the deep periventricular white matter regions bilaterally. There is no mass, hemorrhage, edema or other evidence of acute parenchymal abnormality. No extra-axial hemorrhage. No skull fracture. Visualized upper paranasal sinuses are clear. CT CERVICAL SPINE FINDINGS Extensive degenerative change throughout the cervical spine. There is a slightly displaced fracture within the odontoid process, with slight posterior tilt of the odontoid, with surrounding erosive change that may indicate chronic fracture displacement. There is also a nondisplaced fracture within the left lamina of C1 which is of uncertain age. Fracture diastasis, with small adjacent bony fragments, also noted within the right lamina of C1 which is also of uncertain age but more likely chronic, possibly congenital. No other fracture line or displaced fracture fragment is identified within the cervical spine. Facet joints of the C2 through T1 are normally aligned. Atherosclerotic calcifications are noted at each carotid bulb region. Paravertebral soft tissues are otherwise unremarkable. IMPRESSION: 1. Slightly displaced fracture within the C2 odontoid process, with surrounding erosive change that may indicate chronic fracture displacement. There is a slight posterior tilt of the odontoid and up to 3 mm cortical displacement anteriorly. No associated central canal narrowing. 2. Additional nondisplaced fracture  within the left lamina of the C1 vertebral body, of uncertain age. Fracture diastasis within the right lamina of C1 is also of uncertain age but has a more chronic appearance, and may be congenital. 3. No acute intracranial findings. No intracranial mass, hemorrhage or edema. These results were called by telephone at the time of interpretation on 01/21/2016 at 7:15 pm to Dr. Brantley Stage , who verbally acknowledged these results. Electronically Signed   By: Franki Cabot M.D.   On: 01/21/2016 19:17   Dg Hip Unilat  With Pelvis 2-3 Views Right  01/21/2016  CLINICAL DATA:  Fall, right hip pain. EXAM: DG HIP (WITH OR WITHOUT PELVIS) 2-3V RIGHT COMPARISON:  None. FINDINGS: Markedly displaced fracture within the subcapital region of the right femoral neck. Right femoral head remains well positioned relative to the acetabulum. IMPRESSION: Significantly displaced fracture of the right femoral neck, subcapital region. Electronically Signed   By: Franki Cabot M.D.   On: 01/21/2016 18:48     EKG: Not done in ED, will get one.   Assessment/Plan Principal Problem:   Fracture of femoral neck, right, closed Active Problems:   Hypertension   Hx of thyroid cancer   Hypokalemia   Hypothyroidism   Protein-calorie malnutrition, severe (HCC)   DDD (degenerative disc disease), lumbosacral   GERD (gastroesophageal reflux disease)   Closed displaced fracture of right femoral neck (HCC)   Fall   Odontoid fracture (HCC)   Fracture of femoral neck, right, closed: As evidenced by x-ray. Patient has severe pain and muscle spasm now. No neurovascular compromise. Orthopedic surgeon, Dr. Synthia Innocent plans to do surgery on Monday.   - will admit to Med-surg bed - Pain control: morphine prn and Oxycodon - When necessary Zofran for nausea - Robaxin for muscle spasm - Appreciated Dr. Anne Fu consultation - type and cross - INR/PTT - DVT PPx: give two dose of sq heparin plus SCD  Leukocytosis: Likely due to stress-induced  demargination. Patient does not have signs of infection. -Follow-up CBC  Hypertension: -Continue home propranolol -IV hydralazine when necessary  Hypothyroidism: Last TSH was 0.061 on 02/18/14 -Continue home Synthroid -Check TSH  Hypokalemia: K= 3.4 on admission. -  Repleted - Check Mg level  Protein-calorie malnutrition-Moderate: -Nutrition consult  GERD:  -Protonix prn  Odontoid fracture and possible C1 ring fracture: Neurosurgery, Dr. Joya Salm was consulted by EDP,  recommend start c-collar and follow-up as outpatient. -c-collar -follow-up as outpatient.   DVT ppx: SCD and SQ Heparin (will give 2 dose)  Code Status: Full code Family Communication: Yes, patient's friend at bed side Disposition Plan:  Anticipate discharge back to previous home environment Consults called:  Ortho, Dr. Felix Ahmadi; and neurosurgeon, Dr.Botero Admission status: inpt/ tele   Date of Service 01/21/2016    Ivor Costa [facility] Pager [phone]  If 7PM-7AM, please contact night-coverage [email or web address] Password [email or web address] 01/21/2016, 9:25 PM

## 2016-01-21 NOTE — Consult Note (Signed)
Reason for Consult:Right Femoral neck fracture Referring Physician: Dr. Bonnita Nasuti is an 82 y.o. female.  HPI:  82 yo female who was at home earlier this evening and was getting up from her table when she turned and tripped on her dog, falling and landing on her right side. She did not have any prodromal symptoms leading to the fall. She had immediate right hip pain and felt a pop. She also complains of neck pain and hit her head without LOC. She was unable to get up and called EMS which brought her to the ED. Evaluation showed displaced right femoral neck fracture. She also had a negative head CT but did have a fracture at C1 which may be a chronic injury (neurosurgery consulted by ED). She denies any numbness or tingling in her right leg. Of note, she saw her PCP yesterday due to bilateral leg swelling and was prescribed a diuretic, but did not pick it up at the pharmacy yet.  Past Medical History  Diagnosis Date  . Angina   . Headache(784.0)   . Hypoglycemia   . Fracture 2008    back  . Macular degeneration of both eyes   . Glaucoma   . Hypothyroidism   . Blood transfusion   . Anemia   . GERD (gastroesophageal reflux disease)   . H/O hiatal hernia   . Arthritis   . Chronic back pain   . Thyroid cancer ([place]) 1986  . Shortness of breath 11/08/11    "here lately, all the time"  . Exertional dyspnea 01/17/12  . Osteomyelitis ([place]) 11/2011    right knee  . Hypertension   . Pneumonia 01/2010  . Metabolic acidemia 0/93/2355    Past Surgical History  Procedure Laterality Date  . Thyroidectomy  1986  . Shoulder arthroscopy w/ rotator cuff repair    . Trigger finger release      right  thumb  . Abdominal hysterectomy  1963  . Carpal tunnel release      bilaterally  . Tonsillectomy and adenoidectomy    . Appendectomy    . Dilation and curettage of uterus    . Cataract extraction w/ intraocular lens  implant, bilateral    . Joint replacement  2005, 2006, 2012, 11/10/11     left; left; right; right; right  . Goiter removed  1980's    "when I was young"  . Fracture surgery      "broke back"  . Hand or  09/2011    "left hand; bones were coming together"  . I&d knee with poly exchange  11/10/2011    Procedure: IRRIGATION AND DEBRIDEMENT KNEE WITH POLY EXCHANGE;  Surgeon: Yvette Rack., MD;  Location: [facility];  Service: Orthopedics;  Laterality: Right;  . Post polyethelene exchange  11/09/11    RLE  . Back surgery      "3 back OR's;"; S/P "fractured back 2008"  . Peripherally inserted central catheter insertion  11/2011    "removed 12/2011; LUE"  . Total knee revision  04/28/2012    Procedure: TOTAL KNEE REVISION;  Surgeon: Rudean Haskell, MD;  Location: [facility];  Service: Orthopedics;  Laterality: Right;    History reviewed. No pertinent family history.  Social History:  reports that she has never smoked. She has never used smokeless tobacco. She reports that she does not drink alcohol or use illicit drugs.  Allergies:  Allergies  Allergen Reactions  . Demerol Shortness Of Breath  . Penicillins  Swelling    "in the face" Has patient had a PCN reaction causing immediate rash, facial/tongue/throat swelling, SOB or lightheadedness with hypotension: yes Has patient had a PCN reaction causing severe rash involving mucus membranes or skin necrosis: no Has patient had a PCN reaction that required hospitalization: no  Has patient had a PCN reaction occurring within the last 10 years: no If all of the above answers are "NO", then may proceed with Cephalosporin use.   . Corticosteroids Other (See Comments)    DIZZINESS, HEADACHE  . Lorazepam Anxiety  . Tetracyclines & Related Rash  . Vancomycin Rash    Medications: I have reviewed the patient's current medications.  Results for orders placed or performed during the hospital encounter of 01/21/16 (from the past 48 hour(s))  CBC with Differential     Status: Abnormal   Collection Time: 01/21/16  6:29 PM  Result  Value Ref Range   WBC 13.9 (H) 4.0 - 10.5 K/uL   RBC 4.03 3.87 - 5.11 MIL/uL   Hemoglobin 12.1 12.0 - 15.0 g/dL   HCT 37.5 36.0 - 46.0 %   MCV 93.1 78.0 - 100.0 fL   MCH 30.0 26.0 - 34.0 pg   MCHC 32.3 30.0 - 36.0 g/dL   RDW 13.8 11.5 - 15.5 %   Platelets 288 150 - 400 K/uL   Neutrophils Relative % 89 %   Lymphocytes Relative 4 %   Monocytes Relative 7 %   Eosinophils Relative 0 %   Basophils Relative 0 %   Neutro Abs 12.3 (H) 1.7 - 7.7 K/uL   Lymphs Abs 0.6 (L) 0.7 - 4.0 K/uL   Monocytes Absolute 1.0 0.1 - 1.0 K/uL   Eosinophils Absolute 0.0 0.0 - 0.7 K/uL   Basophils Absolute 0.0 0.0 - 0.1 K/uL   Smear Review MORPHOLOGY UNREMARKABLE   Basic metabolic panel     Status: Abnormal   Collection Time: 01/21/16  6:29 PM  Result Value Ref Range   Sodium 134 (L) 135 - 145 mmol/L   Potassium 3.4 (L) 3.5 - 5.1 mmol/L   Chloride 97 (L) 101 - 111 mmol/L   CO2 26 22 - 32 mmol/L   Glucose, Bld 133 (H) 65 - 99 mg/dL   BUN 18 6 - 20 mg/dL   Creatinine, Ser 0.91 0.44 - 1.00 mg/dL   Calcium 8.6 (L) 8.9 - 10.3 mg/dL   GFR calc non Af Amer 55 (L) >60 mL/min   GFR calc Af Amer >60 >60 mL/min    Comment: (NOTE) The eGFR has been calculated using the CKD EPI equation. This calculation has not been validated in all clinical situations. eGFR's persistently <60 mL/min signify possible Chronic Kidney Disease.    Anion gap 11 5 - 15  Urinalysis, Routine w reflex microscopic (not at [facility])     Status: Abnormal   Collection Time: 01/21/16  8:40 PM  Result Value Ref Range   Color, Urine YELLOW YELLOW   APPearance CLOUDY (A) CLEAR   Specific Gravity, Urine 1.011 1.005 - 1.030   pH 6.0 5.0 - 8.0   Glucose, UA NEGATIVE NEGATIVE mg/dL   Hgb urine dipstick TRACE (A) NEGATIVE   Bilirubin Urine NEGATIVE NEGATIVE   Ketones, ur NEGATIVE NEGATIVE mg/dL   Protein, ur NEGATIVE NEGATIVE mg/dL   Nitrite NEGATIVE NEGATIVE   Leukocytes, UA NEGATIVE NEGATIVE  Urine microscopic-add on     Status: Abnormal    Collection Time: 01/21/16  8:40 PM  Result Value Ref Range  Squamous Epithelial / LPF NONE SEEN NONE SEEN   WBC, UA 0-5 0 - 5 WBC/hpf   RBC / HPF 0-5 0 - 5 RBC/hpf   Bacteria, UA MANY (A) NONE SEEN    Dg Chest 1 View  01/21/2016  CLINICAL DATA:  Fall, right hip pain. EXAM: CHEST 1 VIEW COMPARISON:  Chest x-rays dated 02/18/2014 and 01/2015 2013. FINDINGS: Cardiomediastinal silhouette is stable in size and configuration. Lungs appear hyperexpanded. Lungs are clear. No pleural effusion or pneumothorax seen. Osseous structures about the chest are unremarkable. IMPRESSION: 1. Lungs appear hyperexpanded suggesting COPD/emphysema. 2. Lungs are clear and there is no evidence of acute cardiopulmonary abnormality. Electronically Signed   By: Franki Cabot M.D.   On: 01/21/2016 18:49   Ct Head Wo Contrast  01/21/2016  CLINICAL DATA:  Fall, right hip pain. EXAM: CT HEAD WITHOUT CONTRAST CT CERVICAL SPINE WITHOUT CONTRAST TECHNIQUE: Multidetector CT imaging of the head and cervical spine was performed following the standard protocol without intravenous contrast. Multiplanar CT image reconstructions of the cervical spine were also generated. COMPARISON:  None. FINDINGS: CT HEAD FINDINGS There is mild generalized brain atrophy with commensurate dilatation of the ventricles and sulci. Mild chronic small vessel ischemic change noted within the deep periventricular white matter regions bilaterally. There is no mass, hemorrhage, edema or other evidence of acute parenchymal abnormality. No extra-axial hemorrhage. No skull fracture. Visualized upper paranasal sinuses are clear. CT CERVICAL SPINE FINDINGS Extensive degenerative change throughout the cervical spine. There is a slightly displaced fracture within the odontoid process, with slight posterior tilt of the odontoid, with surrounding erosive change that may indicate chronic fracture displacement. There is also a nondisplaced fracture within the left lamina of C1  which is of uncertain age. Fracture diastasis, with small adjacent bony fragments, also noted within the right lamina of C1 which is also of uncertain age but more likely chronic, possibly congenital. No other fracture line or displaced fracture fragment is identified within the cervical spine. Facet joints of the C2 through T1 are normally aligned. Atherosclerotic calcifications are noted at each carotid bulb region. Paravertebral soft tissues are otherwise unremarkable. IMPRESSION: 1. Slightly displaced fracture within the C2 odontoid process, with surrounding erosive change that may indicate chronic fracture displacement. There is a slight posterior tilt of the odontoid and up to 3 mm cortical displacement anteriorly. No associated central canal narrowing. 2. Additional nondisplaced fracture within the left lamina of the C1 vertebral body, of uncertain age. Fracture diastasis within the right lamina of C1 is also of uncertain age but has a more chronic appearance, and may be congenital. 3. No acute intracranial findings. No intracranial mass, hemorrhage or edema. These results were called by telephone at the time of interpretation on 01/21/2016 at 7:15 pm to Dr. Brantley Stage , who verbally acknowledged these results. Electronically Signed   By: Franki Cabot M.D.   On: 01/21/2016 19:17   Ct Cervical Spine Wo Contrast  01/21/2016  CLINICAL DATA:  Fall, right hip pain. EXAM: CT HEAD WITHOUT CONTRAST CT CERVICAL SPINE WITHOUT CONTRAST TECHNIQUE: Multidetector CT imaging of the head and cervical spine was performed following the standard protocol without intravenous contrast. Multiplanar CT image reconstructions of the cervical spine were also generated. COMPARISON:  None. FINDINGS: CT HEAD FINDINGS There is mild generalized brain atrophy with commensurate dilatation of the ventricles and sulci. Mild chronic small vessel ischemic change noted within the deep periventricular white matter regions bilaterally. There is no  mass, hemorrhage, edema or other  evidence of acute parenchymal abnormality. No extra-axial hemorrhage. No skull fracture. Visualized upper paranasal sinuses are clear. CT CERVICAL SPINE FINDINGS Extensive degenerative change throughout the cervical spine. There is a slightly displaced fracture within the odontoid process, with slight posterior tilt of the odontoid, with surrounding erosive change that may indicate chronic fracture displacement. There is also a nondisplaced fracture within the left lamina of C1 which is of uncertain age. Fracture diastasis, with small adjacent bony fragments, also noted within the right lamina of C1 which is also of uncertain age but more likely chronic, possibly congenital. No other fracture line or displaced fracture fragment is identified within the cervical spine. Facet joints of the C2 through T1 are normally aligned. Atherosclerotic calcifications are noted at each carotid bulb region. Paravertebral soft tissues are otherwise unremarkable. IMPRESSION: 1. Slightly displaced fracture within the C2 odontoid process, with surrounding erosive change that may indicate chronic fracture displacement. There is a slight posterior tilt of the odontoid and up to 3 mm cortical displacement anteriorly. No associated central canal narrowing. 2. Additional nondisplaced fracture within the left lamina of the C1 vertebral body, of uncertain age. Fracture diastasis within the right lamina of C1 is also of uncertain age but has a more chronic appearance, and may be congenital. 3. No acute intracranial findings. No intracranial mass, hemorrhage or edema. These results were called by telephone at the time of interpretation on 01/21/2016 at 7:15 pm to Dr. Brantley Stage , who verbally acknowledged these results. Electronically Signed   By: Franki Cabot M.D.   On: 01/21/2016 19:17   Dg Hip Unilat  With Pelvis 2-3 Views Right  01/21/2016  CLINICAL DATA:  Fall, right hip pain. EXAM: DG HIP (WITH OR WITHOUT  PELVIS) 2-3V RIGHT COMPARISON:  None. FINDINGS: Markedly displaced fracture within the subcapital region of the right femoral neck. Right femoral head remains well positioned relative to the acetabulum. IMPRESSION: Significantly displaced fracture of the right femoral neck, subcapital region. Electronically Signed   By: Franki Cabot M.D.   On: 01/21/2016 18:48    ROS Blood pressure 184/90, pulse 92, temperature 98.4 F (36.9 C), temperature source Oral, resp. rate 18, SpO2 94 %. Physical Exam Physical Examination: General appearance - alert, well appearing, and in no distress Mental status - alert, oriented to person, place, and time Chest - clear to auscultation, no wheezes, rales or rhonchi, symmetric air entry Heart - normal rate, regular rhythm, normal S1, S2, no murmurs, rubs, clicks or gallops Abdomen - soft, nontender, nondistended, no masses or organomegaly Neurological - alert, oriented, normal speech, no focal findings or movement disorder noted RLE shortened and externally rotated, Pain on any attempted ROM of right hip; 2+ edema BLE with erythema lower legs; no ulcerations; knee shows scar from previous TKA revision; tender right lateral hip; pelvis stable to compression /distraction  X-ray-- Displaced right femoral neck fracture   Assessment/Plan: Right femoral neck fracture- Patient will require a right hip hemiarthroplasty as definitive treatment. She has no arthritis and had no hip pain prior to this, thus does not need a THA. Discussed surgery in detail and she wants to proceed. Plan OR on Monday 5/22. She needs to have the BLE edema addressed by medical team and ensure she does not have CHF prior to surgery.   Gearlean Alf 01/21/2016, 9:11 PM

## 2016-01-22 ENCOUNTER — Inpatient Hospital Stay (HOSPITAL_COMMUNITY): Payer: Medicare Other

## 2016-01-22 DIAGNOSIS — Z01810 Encounter for preprocedural cardiovascular examination: Secondary | ICD-10-CM

## 2016-01-22 DIAGNOSIS — E440 Moderate protein-calorie malnutrition: Secondary | ICD-10-CM

## 2016-01-22 DIAGNOSIS — Z85850 Personal history of malignant neoplasm of thyroid: Secondary | ICD-10-CM

## 2016-01-22 LAB — BASIC METABOLIC PANEL
ANION GAP: 7 (ref 5–15)
BUN: 16 mg/dL (ref 6–20)
CALCIUM: 7.9 mg/dL — AB (ref 8.9–10.3)
CO2: 28 mmol/L (ref 22–32)
Chloride: 100 mmol/L — ABNORMAL LOW (ref 101–111)
Creatinine, Ser: 0.88 mg/dL (ref 0.44–1.00)
GFR calc Af Amer: >60 mL/min (ref >60)
GFR, EST NON AFRICAN AMERICAN: 57 mL/min — AB (ref >60)
Glucose, Bld: 115 mg/dL — ABNORMAL HIGH (ref 65–99)
POTASSIUM: 3.5 mmol/L (ref 3.5–5.1)
SODIUM: 135 mmol/L (ref 135–145)

## 2016-01-22 LAB — CBC
HCT: 34 % — ABNORMAL LOW (ref 36.0–46.0)
Hemoglobin: 11 g/dL — ABNORMAL LOW (ref 12.0–15.0)
MCH: 30.2 pg (ref 26.0–34.0)
MCHC: 32.4 g/dL (ref 30.0–36.0)
MCV: 93.4 fL (ref 78.0–100.0)
PLATELETS: 259 10*3/uL (ref 150–400)
RBC: 3.64 MIL/uL — AB (ref 3.87–5.11)
RDW: 14 % (ref 11.5–15.5)
WBC: 7.3 10*3/uL (ref 4.0–10.5)

## 2016-01-22 LAB — TYPE AND SCREEN
ABO/RH(D): O POS
Antibody Screen: POSITIVE
DAT, IGG: NEGATIVE

## 2016-01-22 LAB — SURGICAL PCR SCREEN
MRSA, PCR: NEGATIVE
STAPHYLOCOCCUS AUREUS: NEGATIVE

## 2016-01-22 LAB — TSH: TSH: 0.14 u[IU]/mL — AB (ref 0.350–4.500)

## 2016-01-22 LAB — ECHOCARDIOGRAM COMPLETE

## 2016-01-22 LAB — MAGNESIUM: MAGNESIUM: 2.2 mg/dL (ref 1.7–2.4)

## 2016-01-22 MED ORDER — IPRATROPIUM-ALBUTEROL 0.5-2.5 (3) MG/3ML IN SOLN: 3.0000 mL | RESPIRATORY_TRACT | Status: DC | PRN

## 2016-01-22 MED ORDER — IPRATROPIUM-ALBUTEROL 0.5-2.5 (3) MG/3ML IN SOLN
3.0000 mL | Freq: Three times a day (TID) | RESPIRATORY_TRACT | Status: DC
  Filled 2016-01-22: qty 3

## 2016-01-22 MED ORDER — LEVOTHYROXINE SODIUM 25 MCG PO TABS
137.0000 ug | ORAL_TABLET | Freq: Every day | ORAL | Status: DC
  Administered 2016-01-23 – 2016-01-26 (×4): 137 ug via ORAL
  Filled 2016-01-22 (×4): qty 1

## 2016-01-22 MED ORDER — OXYCODONE HCL ER 15 MG PO T12A
15.0000 mg | EXTENDED_RELEASE_TABLET | Freq: Two times a day (BID) | ORAL | Status: DC
  Administered 2016-01-22 – 2016-01-26 (×9): 15 mg via ORAL
  Filled 2016-01-22 (×9): qty 1

## 2016-01-22 MED ORDER — SENNOSIDES-DOCUSATE SODIUM 8.6-50 MG PO TABS
1.0000 | ORAL_TABLET | Freq: Every day | ORAL | Status: DC
  Administered 2016-01-23 – 2016-01-25 (×2): 1 via ORAL
  Filled 2016-01-22 (×2): qty 1

## 2016-01-22 MED ORDER — POTASSIUM CHLORIDE CRYS ER 20 MEQ PO TBCR
40.0000 meq | EXTENDED_RELEASE_TABLET | Freq: Once | ORAL | Status: AC
  Administered 2016-01-22: 40 meq via ORAL
  Filled 2016-01-22: qty 2

## 2016-01-22 MED ORDER — ACETAMINOPHEN 325 MG PO TABS
650.0000 mg | ORAL_TABLET | Freq: Four times a day (QID) | ORAL | Status: DC | PRN
  Administered 2016-01-22: 650 mg via ORAL
  Filled 2016-01-22: qty 2

## 2016-01-22 MED ORDER — ACETAMINOPHEN 500 MG PO TABS: 500.0000 mg | ORAL_TABLET | Freq: Three times a day (TID) | ORAL | Status: DC

## 2016-01-22 MED ORDER — PANTOPRAZOLE SODIUM 40 MG PO TBEC
40.0000 mg | DELAYED_RELEASE_TABLET | Freq: Every day | ORAL | Status: DC
  Administered 2016-01-22 – 2016-01-26 (×5): 40 mg via ORAL
  Filled 2016-01-22 (×5): qty 1

## 2016-01-22 MED ORDER — LIP MEDEX EX OINT
TOPICAL_OINTMENT | CUTANEOUS | Status: AC
  Administered 2016-01-22: 01:00:00
  Filled 2016-01-22: qty 7

## 2016-01-22 MED ORDER — POLYETHYLENE GLYCOL 3350 17 G PO PACK
17.0000 g | PACK | Freq: Two times a day (BID) | ORAL | Status: DC
  Administered 2016-01-22: 17 g via ORAL
  Filled 2016-01-22 (×3): qty 1

## 2016-01-22 NOTE — Progress Notes (Signed)
PT Cancellation Note  Patient Details Name: [name] MRN: [id number] DOB: April 05, 1925   Cancelled Treatment:    Reason Eval/Treat Not Completed: Patient not medically ready; pt sched for R hip hemiarthroplsasty per Dr. Wynelle Link on Monday--will defer until re-order per ortho;    [facility] 01/22/2016, 8:26 AM

## 2016-01-22 NOTE — Progress Notes (Signed)
OT Cancellation Note  Patient Details Name: [name] MRN: [id number] DOB: June 24, 1925   Cancelled Treatment:    Reason Eval/Treat Not Completed: Other (comment) Note plan for surgery 5/22. Will await new orders after surgery. Thank you.  Pauline Aus [place] 01/22/2016, 8:46 AM

## 2016-01-22 NOTE — Progress Notes (Signed)
PROGRESS NOTE    [name]  [id number] DOB: 11-Apr-1977 DOA: 01/21/2016 PCP: Merrilee Seashore, MD   Assessment & Plan:   Principal Problem:   Fracture of femoral neck, right, closed Active Problems:   Hypertension   Hx of thyroid cancer   Hypokalemia   Hypothyroidism   Protein-calorie malnutrition, moderate (HCC)   DDD (degenerative disc disease), lumbosacral   GERD (gastroesophageal reflux disease)   Closed displaced fracture of right femoral neck (HCC)   Fall   Odontoid fracture ([place])   Closed right hip fracture ([place])  #1 right femoral neck fracture Questionable etiology. Secondary to mechanical fall. Patient has been seen in consultation by orthopedics were planning on taking patient to the operating room on 01/23/2016. Continue oxycodone for breakthrough pain. Patient with significant pain and a such will place on long-acting OxyContin 15 mg twice a day. Continue Robaxin when necessary. Per orthopedics.  #2 leukocytosis Likely secondary to stress-induced emargination. No signs or symptoms of infection. WBC trending down. Follow.  #3 hypertension Continue propranolol.  #4 hypothyroidism TSH of 0.140. Decrease dose of Synthroid.  #5 odontoid fracture and possible C1 ring fracture Per admission history and physical EDP spoke with neurosurgery, Dr. Joya Salm who recommended starting patient on his c-collar with outpatient follow-up.  #6 gastroesophageal reflux disease  PPI.  #7 moderate protein calorie Malnutrition Dietitian consultation pending.  #8 minimal lower extremity edema versus venous stasis changes Bilateral lower extremities with minimal edema trace to 1+, erythematous, nontender to palpation. Check a 2-D echo. Follow.    DVT prophylaxis: SCDs Code Status: DO NOT RESUSCITATE Family Communication: Updated patient at bedside Disposition Plan: Pending PT OT evaluation. Likely skilled nursing facility.   Consultants:   Orthopedics: Dr.Aluisio  01/21/2016  Procedures:   Plain films of the right hip and pelvis 01/21/2016  2-D echo 01/22/2016  Chest x-ray 01/21/2016  CT head CT C-spine 01/21/2016  Antimicrobials:   None   Subjective: Patient states right hip significantly painful. Patient asking for pain medications. No chest pain. No shortness of breath.  Objective: Filed Vitals:   01/21/16 1759 01/21/16 2137 01/21/16 2236 01/22/16 0510  BP: 184/90 141/112 171/84 140/60  Pulse: 92 102 110 99  Temp: 98.4 F (36.9 C)  98.9 F (37.2 C) 98.9 F (37.2 C)  TempSrc: Oral  Oral Oral  Resp: 18 18 22 15   SpO2: 94% 100% 94% 95%    Intake/Output Summary (Last 24 hours) at 01/22/16 1231 Last data filed at 01/22/16 1010  Gross per 24 hour  Intake    120 ml  Output    525 ml  Net   -405 ml   There were no vitals filed for this visit.  Examination:  General exam: Appears calm and comfortable. C-collar. Respiratory system: Clear to auscultation. Respiratory effort normal. Cardiovascular system: S1 & S2 heard, RRR. No JVD, murmurs, rubs, gallops or clicks. Trace to 1+ bilateral lower extremity edema. Gastrointestinal system: Abdomen is nondistended, soft and nontender. No organomegaly or masses felt. Normal bowel sounds heard. Central nervous system: Alert and oriented. No focal neurological deficits. Extremities: Symmetric 5 x 5 power. Skin: Bilateral lower extremity edema with some slight erythema that is blanchable, nontender to palpation, trace to 1+ edema. Some excoriations. No rashes, lesions or ulcers Psychiatry: Judgement and insight appear normal. Mood & affect appropriate.     Data Reviewed: I have personally reviewed following labs and imaging studies  CBC:  Recent Labs Lab 01/21/16 1829 01/22/16 0436  WBC 13.9* 7.3  NEUTROABS  12.3*  --   HGB 12.1 11.0*  HCT 37.5 34.0*  MCV 93.1 93.4  PLT 288 [id number]   Basic Metabolic Panel:  Recent Labs Lab 01/21/16 1829 01/22/16 0436  NA 134* 135  K 3.4*  3.5  CL 97* 100*  CO2 26 28  GLUCOSE 133* 115*  BUN 18 16  CREATININE 0.91 0.88  CALCIUM 8.6* 7.9*  MG  --  2.2   GFR: CrCl cannot be calculated (Unknown ideal weight.). Liver Function Tests: No results for input(s): AST, ALT, ALKPHOS, BILITOT, PROT, ALBUMIN in the last 168 hours. No results for input(s): LIPASE, AMYLASE in the last 168 hours. No results for input(s): AMMONIA in the last 168 hours. Coagulation Profile:  Recent Labs Lab 01/21/16 2252  INR 1.04   Cardiac Enzymes: No results for input(s): CKTOTAL, CKMB, CKMBINDEX, TROPONINI in the last 168 hours. BNP (last 3 results) No results for input(s): PROBNP in the last 8760 hours. HbA1C: No results for input(s): HGBA1C in the last 72 hours. CBG: No results for input(s): GLUCAP in the last 168 hours. Lipid Profile: No results for input(s): CHOL, HDL, LDLCALC, TRIG, CHOLHDL, LDLDIRECT in the last 72 hours. Thyroid Function Tests:  Recent Labs  01/22/16 0436  TSH 0.140*   Anemia Panel: No results for input(s): VITAMINB12, FOLATE, FERRITIN, TIBC, IRON, RETICCTPCT in the last 72 hours. Sepsis Labs: No results for input(s): PROCALCITON, LATICACIDVEN in the last 168 hours.  Recent Results (from the past 240 hour(s))  Surgical PCR screen     Status: None   Collection Time: 01/22/16  5:39 AM  Result Value Ref Range Status   MRSA, PCR NEGATIVE NEGATIVE Final   Staphylococcus aureus NEGATIVE NEGATIVE Final    Comment:        The Xpert SA Assay (FDA approved for NASAL specimens in patients over 73 years of age), is one component of a comprehensive surveillance program.  Test performance has been validated by [facility] for patients greater than or equal to 4 year old. It is not intended to diagnose infection nor to guide or monitor treatment.          Radiology Studies: Dg Chest 1 View  01/21/2016  CLINICAL DATA:  Fall, right hip pain. EXAM: CHEST 1 VIEW COMPARISON:  Chest x-rays dated 02/18/2014 and  01/2015 2013. FINDINGS: Cardiomediastinal silhouette is stable in size and configuration. Lungs appear hyperexpanded. Lungs are clear. No pleural effusion or pneumothorax seen. Osseous structures about the chest are unremarkable. IMPRESSION: 1. Lungs appear hyperexpanded suggesting COPD/emphysema. 2. Lungs are clear and there is no evidence of acute cardiopulmonary abnormality. Electronically Signed   By: Franki Cabot M.D.   On: 01/21/2016 18:49   Ct Head Wo Contrast  01/21/2016  CLINICAL DATA:  Fall, right hip pain. EXAM: CT HEAD WITHOUT CONTRAST CT CERVICAL SPINE WITHOUT CONTRAST TECHNIQUE: Multidetector CT imaging of the head and cervical spine was performed following the standard protocol without intravenous contrast. Multiplanar CT image reconstructions of the cervical spine were also generated. COMPARISON:  None. FINDINGS: CT HEAD FINDINGS There is mild generalized brain atrophy with commensurate dilatation of the ventricles and sulci. Mild chronic small vessel ischemic change noted within the deep periventricular white matter regions bilaterally. There is no mass, hemorrhage, edema or other evidence of acute parenchymal abnormality. No extra-axial hemorrhage. No skull fracture. Visualized upper paranasal sinuses are clear. CT CERVICAL SPINE FINDINGS Extensive degenerative change throughout the cervical spine. There is a slightly displaced fracture within the odontoid process, with slight  posterior tilt of the odontoid, with surrounding erosive change that may indicate chronic fracture displacement. There is also a nondisplaced fracture within the left lamina of C1 which is of uncertain age. Fracture diastasis, with small adjacent bony fragments, also noted within the right lamina of C1 which is also of uncertain age but more likely chronic, possibly congenital. No other fracture line or displaced fracture fragment is identified within the cervical spine. Facet joints of the C2 through T1 are normally  aligned. Atherosclerotic calcifications are noted at each carotid bulb region. Paravertebral soft tissues are otherwise unremarkable. IMPRESSION: 1. Slightly displaced fracture within the C2 odontoid process, with surrounding erosive change that may indicate chronic fracture displacement. There is a slight posterior tilt of the odontoid and up to 3 mm cortical displacement anteriorly. No associated central canal narrowing. 2. Additional nondisplaced fracture within the left lamina of the C1 vertebral body, of uncertain age. Fracture diastasis within the right lamina of C1 is also of uncertain age but has a more chronic appearance, and may be congenital. 3. No acute intracranial findings. No intracranial mass, hemorrhage or edema. These results were called by telephone at the time of interpretation on 01/21/2016 at 7:15 pm to Dr. Brantley Stage , who verbally acknowledged these results. Electronically Signed   By: Franki Cabot M.D.   On: 01/21/2016 19:17   Ct Cervical Spine Wo Contrast  01/21/2016  CLINICAL DATA:  Fall, right hip pain. EXAM: CT HEAD WITHOUT CONTRAST CT CERVICAL SPINE WITHOUT CONTRAST TECHNIQUE: Multidetector CT imaging of the head and cervical spine was performed following the standard protocol without intravenous contrast. Multiplanar CT image reconstructions of the cervical spine were also generated. COMPARISON:  None. FINDINGS: CT HEAD FINDINGS There is mild generalized brain atrophy with commensurate dilatation of the ventricles and sulci. Mild chronic small vessel ischemic change noted within the deep periventricular white matter regions bilaterally. There is no mass, hemorrhage, edema or other evidence of acute parenchymal abnormality. No extra-axial hemorrhage. No skull fracture. Visualized upper paranasal sinuses are clear. CT CERVICAL SPINE FINDINGS Extensive degenerative change throughout the cervical spine. There is a slightly displaced fracture within the odontoid process, with slight  posterior tilt of the odontoid, with surrounding erosive change that may indicate chronic fracture displacement. There is also a nondisplaced fracture within the left lamina of C1 which is of uncertain age. Fracture diastasis, with small adjacent bony fragments, also noted within the right lamina of C1 which is also of uncertain age but more likely chronic, possibly congenital. No other fracture line or displaced fracture fragment is identified within the cervical spine. Facet joints of the C2 through T1 are normally aligned. Atherosclerotic calcifications are noted at each carotid bulb region. Paravertebral soft tissues are otherwise unremarkable. IMPRESSION: 1. Slightly displaced fracture within the C2 odontoid process, with surrounding erosive change that may indicate chronic fracture displacement. There is a slight posterior tilt of the odontoid and up to 3 mm cortical displacement anteriorly. No associated central canal narrowing. 2. Additional nondisplaced fracture within the left lamina of the C1 vertebral body, of uncertain age. Fracture diastasis within the right lamina of C1 is also of uncertain age but has a more chronic appearance, and may be congenital. 3. No acute intracranial findings. No intracranial mass, hemorrhage or edema. These results were called by telephone at the time of interpretation on 01/21/2016 at 7:15 pm to Dr. Brantley Stage , who verbally acknowledged these results. Electronically Signed   By: Roxy Horseman.D.  On: 01/21/2016 19:17   Dg Hip Unilat  With Pelvis 2-3 Views Right  01/21/2016  CLINICAL DATA:  Fall, right hip pain. EXAM: DG HIP (WITH OR WITHOUT PELVIS) 2-3V RIGHT COMPARISON:  None. FINDINGS: Markedly displaced fracture within the subcapital region of the right femoral neck. Right femoral head remains well positioned relative to the acetabulum. IMPRESSION: Significantly displaced fracture of the right femoral neck, subcapital region. Electronically Signed   By: Franki Cabot  M.D.   On: 01/21/2016 18:48        Scheduled Meds: . brimonidine  1 drop Both Eyes BID  . chlorhexidine  60 mL Topical Once  . [START ON 01/23/2016] clindamycin (CLEOCIN) IV  900 mg Intravenous On Call to OR  . dorzolamide-timolol  1 drop Both Eyes BID  . gabapentin  600 mg Oral TID  . ipratropium-albuterol  3 mL Nebulization TID  . latanoprost  1 drop Both Eyes QHS  . levothyroxine  150 mcg Oral QAC breakfast  . oxyCODONE  15 mg Oral Q12H  . oxymetazoline  1 spray Each Nare BID  . pantoprazole  40 mg Oral Daily  . polyethylene glycol  17 g Oral BID  . povidone-iodine  2 application Topical Once  . propranolol  20 mg Oral Daily  . senna-docusate  1 tablet Oral QHS   Continuous Infusions:    LOS: 1 day    Time spent: 34 minutes    Eragon Hammond, MD [facility] Pager [phone]  If 7PM-7AM, please contact night-coverage [email or web address] Password [facility] 01/22/2016, 12:31 PM

## 2016-01-22 NOTE — Progress Notes (Signed)
Clinical Social Work aware of consult and ?disposition needs once patient has had surgery. Will have unit CSW follow up with patient next week with regards to disposition, following acutely.   Lane Hacker, MSW Clinical Social Work: System [employer] [phone]

## 2016-01-22 NOTE — Progress Notes (Signed)
Patient refuses BD and all scheduled nebulizers. She states she is breathing "good as always" and states she will "tell somebody if I have trouble breathing". RT treatment assessment protocol completed. Orders changed accordingly. BD made prn only per patient request.

## 2016-01-22 NOTE — Progress Notes (Signed)
[name]  MRN: [id number] DOB/Age: 01/29/1925 82 y.o. Physician: Aluisio Procedure:       Subjective: Pain under better control since reaching the floor but still anxious and ready to proceed with surgert  Vital Signs Temp:  [98.4 F (36.9 C)-98.9 F (37.2 C)] 98.9 F (37.2 C) (05/21 0510) Pulse Rate:  [92-110] 99 (05/21 0510) Resp:  [15-22] 15 (05/21 0510) BP: (140-184)/(60-112) 140/60 mmHg (05/21 0510) SpO2:  [94 %-100 %] 95 % (05/21 0510)  Lab Results  Recent Labs  01/21/16 1829 01/22/16 0436  WBC 13.9* 7.3  HGB 12.1 11.0*  HCT 37.5 34.0*  PLT 288 259   BMET  Recent Labs  01/21/16 1829 01/22/16 0436  NA 134* 135  K 3.4* 3.5  CL 97* 100*  CO2 26 28  GLUCOSE 133* 115*  BUN 18 16  CREATININE 0.91 0.88  CALCIUM 8.6* 7.9*   INR  Date Value Ref Range Status  01/21/2016 1.04 0.00 - 1.49 Final     Exam NVI but still with edematous and slightly red lower extremities        Plan Plan for OR tomorrow with Dr. Wynelle Link for hemiarthroplasty. NPO after midnight  Kloey Cazarez PA-C   01/22/2016, 8:27 AM Contact # [phone]

## 2016-01-22 NOTE — Progress Notes (Signed)
Pt is refusing to allow nursing staff to turn her Q2 hours and to remove all bedding from the ED stretcher due to pain. Pt has been educated on the necessity of turning to prevent pressure ulcers. Pt has been medicated when pain medication was due and continued to refuse turning. Will continue to monitor

## 2016-01-22 NOTE — Progress Notes (Signed)
Utilization review completed.  

## 2016-01-23 ENCOUNTER — Inpatient Hospital Stay (HOSPITAL_COMMUNITY): Payer: Medicare Other | Admitting: Certified Registered Nurse Anesthetist

## 2016-01-23 ENCOUNTER — Inpatient Hospital Stay (HOSPITAL_COMMUNITY): Payer: Medicare Other

## 2016-01-23 ENCOUNTER — Encounter (HOSPITAL_COMMUNITY)
Admission: EM | Disposition: A | Discharge status: Home Health | Payer: Self-pay | Source: Home / Self Care | Attending: Internal Medicine

## 2016-01-23 ENCOUNTER — Encounter (HOSPITAL_COMMUNITY): Payer: Self-pay | Admitting: Orthopedic Surgery

## 2016-01-23 HISTORY — PX: HIP ARTHROPLASTY: SHX981

## 2016-01-23 LAB — IRON AND TIBC
Iron: 18 ug/dL — ABNORMAL LOW (ref 28–170)
SATURATION RATIOS: 8 % — AB (ref 10.4–31.8)
TIBC: 232 ug/dL — ABNORMAL LOW (ref 250–450)
UIBC: 214 ug/dL

## 2016-01-23 LAB — CBC
HCT: 33.2 % — ABNORMAL LOW (ref 36.0–46.0)
HEMOGLOBIN: 10.5 g/dL — AB (ref 12.0–15.0)
MCH: 30.1 pg (ref 26.0–34.0)
MCHC: 31.6 g/dL (ref 30.0–36.0)
MCV: 95.1 fL (ref 78.0–100.0)
Platelets: 220 10*3/uL (ref 150–400)
RBC: 3.49 MIL/uL — AB (ref 3.87–5.11)
RDW: 14.2 % (ref 11.5–15.5)
WBC: 6.2 10*3/uL (ref 4.0–10.5)

## 2016-01-23 LAB — FOLATE: Folate: 20.1 ng/mL (ref >5.9)

## 2016-01-23 LAB — BASIC METABOLIC PANEL
ANION GAP: 4 — AB (ref 5–15)
BUN: 16 mg/dL (ref 6–20)
CHLORIDE: 100 mmol/L — AB (ref 101–111)
CO2: 30 mmol/L (ref 22–32)
CREATININE: 1 mg/dL (ref 0.44–1.00)
Calcium: 8 mg/dL — ABNORMAL LOW (ref 8.9–10.3)
GFR calc non Af Amer: 49 mL/min — ABNORMAL LOW (ref >60)
GFR, EST AFRICAN AMERICAN: 57 mL/min — AB (ref >60)
Glucose, Bld: 118 mg/dL — ABNORMAL HIGH (ref 65–99)
Potassium: 4.2 mmol/L (ref 3.5–5.1)
SODIUM: 134 mmol/L — AB (ref 135–145)

## 2016-01-23 LAB — RETICULOCYTES
RBC.: 3.56 MIL/uL — AB (ref 3.87–5.11)
RETIC CT PCT: 1 % (ref 0.4–3.1)
Retic Count, Absolute: 35.6 10*3/uL (ref 19.0–186.0)

## 2016-01-23 LAB — FERRITIN: FERRITIN: 142 ng/mL (ref 11–307)

## 2016-01-23 LAB — VITAMIN B12: Vitamin B-12: 168 pg/mL — ABNORMAL LOW (ref 180–914)

## 2016-01-23 SURGERY — HEMIARTHROPLASTY, HIP, DIRECT ANTERIOR APPROACH, FOR FRACTURE
Anesthesia: General | Site: Hip | Laterality: Right

## 2016-01-23 MED ORDER — ACETAMINOPHEN 650 MG RE SUPP: 650.0000 mg | Freq: Four times a day (QID) | RECTAL | Status: DC | PRN

## 2016-01-23 MED ORDER — MENTHOL 3 MG MT LOZG: 1.0000 | LOZENGE | OROMUCOSAL | Status: DC | PRN

## 2016-01-23 MED ORDER — FENTANYL CITRATE (PF) 100 MCG/2ML IJ SOLN
INTRAMUSCULAR | Status: AC
  Filled 2016-01-23: qty 2

## 2016-01-23 MED ORDER — METOCLOPRAMIDE HCL 5 MG PO TABS: 5.0000 mg | ORAL_TABLET | Freq: Three times a day (TID) | ORAL | Status: DC | PRN

## 2016-01-23 MED ORDER — ONDANSETRON HCL 4 MG/2ML IJ SOLN: 4.0000 mg | Freq: Four times a day (QID) | INTRAMUSCULAR | Status: DC | PRN

## 2016-01-23 MED ORDER — FLEET ENEMA 7-19 GM/118ML RE ENEM: 1.0000 | ENEMA | Freq: Once | RECTAL | Status: DC | PRN

## 2016-01-23 MED ORDER — SUGAMMADEX SODIUM 200 MG/2ML IV SOLN
INTRAVENOUS | Status: DC | PRN
  Administered 2016-01-23: 100 mg via INTRAVENOUS

## 2016-01-23 MED ORDER — PROPOFOL 10 MG/ML IV BOLUS
INTRAVENOUS | Status: DC | PRN
  Administered 2016-01-23: 100 mg via INTRAVENOUS

## 2016-01-23 MED ORDER — ACETAMINOPHEN 160 MG/5ML PO SOLN
500.0000 mg | Freq: Once | ORAL | Status: AC
  Administered 2016-01-23: 500 mg via ORAL

## 2016-01-23 MED ORDER — BISACODYL 10 MG RE SUPP: 10.0000 mg | Freq: Every day | RECTAL | Status: DC | PRN

## 2016-01-23 MED ORDER — LIDOCAINE HCL (CARDIAC) 20 MG/ML IV SOLN
INTRAVENOUS | Status: DC | PRN
  Administered 2016-01-23: 50 mg via INTRAVENOUS

## 2016-01-23 MED ORDER — ONDANSETRON HCL 4 MG PO TABS: 4.0000 mg | ORAL_TABLET | Freq: Four times a day (QID) | ORAL | Status: DC | PRN

## 2016-01-23 MED ORDER — FENTANYL CITRATE (PF) 100 MCG/2ML IJ SOLN
25.0000 ug | INTRAMUSCULAR | Status: DC | PRN
  Administered 2016-01-23 (×2): 50 ug via INTRAVENOUS

## 2016-01-23 MED ORDER — FENTANYL CITRATE (PF) 250 MCG/5ML IJ SOLN
INTRAMUSCULAR | Status: AC
  Filled 2016-01-23: qty 5

## 2016-01-23 MED ORDER — DEXAMETHASONE SODIUM PHOSPHATE 10 MG/ML IJ SOLN
INTRAMUSCULAR | Status: AC
  Filled 2016-01-23: qty 1

## 2016-01-23 MED ORDER — LACTATED RINGERS IV SOLN
INTRAVENOUS | Status: DC
  Administered 2016-01-23 (×3): via INTRAVENOUS

## 2016-01-23 MED ORDER — SODIUM CHLORIDE 0.9 % IV SOLN
INTRAVENOUS | Status: DC
  Administered 2016-01-23: 17:00:00 via INTRAVENOUS

## 2016-01-23 MED ORDER — SODIUM CHLORIDE 0.9 % IR SOLN
Status: DC | PRN
  Administered 2016-01-23: 1000 mL

## 2016-01-23 MED ORDER — BUPIVACAINE HCL (PF) 0.25 % IJ SOLN
INTRAMUSCULAR | Status: AC
  Filled 2016-01-23: qty 30

## 2016-01-23 MED ORDER — FENTANYL CITRATE (PF) 100 MCG/2ML IJ SOLN
INTRAMUSCULAR | Status: DC | PRN
  Administered 2016-01-23 (×5): 25 ug via INTRAVENOUS

## 2016-01-23 MED ORDER — POLYETHYLENE GLYCOL 3350 17 G PO PACK: 17.0000 g | PACK | Freq: Every day | ORAL | Status: DC | PRN

## 2016-01-23 MED ORDER — ENOXAPARIN SODIUM 40 MG/0.4ML ~~LOC~~ SOLN
40.0000 mg | SUBCUTANEOUS | Status: DC
  Administered 2016-01-24 – 2016-01-26 (×3): 40 mg via SUBCUTANEOUS
  Filled 2016-01-23 (×3): qty 0.4

## 2016-01-23 MED ORDER — DEXAMETHASONE SODIUM PHOSPHATE 10 MG/ML IJ SOLN
INTRAMUSCULAR | Status: DC | PRN
  Administered 2016-01-23: 5 mg via INTRAVENOUS

## 2016-01-23 MED ORDER — SUCCINYLCHOLINE CHLORIDE 20 MG/ML IJ SOLN
INTRAMUSCULAR | Status: DC | PRN
  Administered 2016-01-23: 100 mg via INTRAVENOUS

## 2016-01-23 MED ORDER — EPHEDRINE SULFATE 50 MG/ML IJ SOLN
INTRAMUSCULAR | Status: DC | PRN
  Administered 2016-01-23: 10 mg via INTRAVENOUS
  Administered 2016-01-23: 5 mg via INTRAVENOUS

## 2016-01-23 MED ORDER — PHENOL 1.4 % MT LIQD
1.0000 | OROMUCOSAL | Status: DC | PRN
  Filled 2016-01-23: qty 177

## 2016-01-23 MED ORDER — PROPOFOL 10 MG/ML IV BOLUS
INTRAVENOUS | Status: AC
  Filled 2016-01-23: qty 20

## 2016-01-23 MED ORDER — TRAMADOL HCL 50 MG PO TABS
50.0000 mg | ORAL_TABLET | Freq: Four times a day (QID) | ORAL | Status: DC | PRN
  Administered 2016-01-23: 50 mg via ORAL
  Administered 2016-01-24 – 2016-01-25 (×2): 100 mg via ORAL
  Filled 2016-01-23: qty 1
  Filled 2016-01-23 (×2): qty 2

## 2016-01-23 MED ORDER — ACETAMINOPHEN 325 MG PO TABS: 650.0000 mg | ORAL_TABLET | Freq: Four times a day (QID) | ORAL | Status: DC | PRN

## 2016-01-23 MED ORDER — ROCURONIUM BROMIDE 100 MG/10ML IV SOLN
INTRAVENOUS | Status: DC | PRN
  Administered 2016-01-23: 30 mg via INTRAVENOUS

## 2016-01-23 MED ORDER — CLINDAMYCIN PHOSPHATE 600 MG/50ML IV SOLN
600.0000 mg | Freq: Once | INTRAVENOUS | Status: AC
  Administered 2016-01-23: 600 mg via INTRAVENOUS
  Filled 2016-01-23: qty 50

## 2016-01-23 MED ORDER — LIDOCAINE HCL (CARDIAC) 20 MG/ML IV SOLN
INTRAVENOUS | Status: AC
  Filled 2016-01-23: qty 5

## 2016-01-23 MED ORDER — ACETAMINOPHEN 160 MG/5ML PO SOLN
ORAL | Status: AC
  Filled 2016-01-23: qty 20.3

## 2016-01-23 MED ORDER — ONDANSETRON HCL 4 MG/2ML IJ SOLN
INTRAMUSCULAR | Status: AC
Start: 2016-01-23 — End: 2016-01-23
  Filled 2016-01-23: qty 2

## 2016-01-23 MED ORDER — BUPIVACAINE HCL (PF) 0.25 % IJ SOLN
INTRAMUSCULAR | Status: DC | PRN
Start: 2016-01-23 — End: 2016-01-23
  Administered 2016-01-23: 30 mL

## 2016-01-23 MED ORDER — MORPHINE SULFATE (PF) 2 MG/ML IV SOLN: 0.5000 mg | INTRAVENOUS | Status: DC | PRN

## 2016-01-23 MED ORDER — METOCLOPRAMIDE HCL 5 MG/ML IJ SOLN
5.0000 mg | Freq: Three times a day (TID) | INTRAMUSCULAR | Status: DC | PRN
Start: 2016-01-23 — End: 2016-01-26

## 2016-01-23 MED ORDER — HYDROMORPHONE HCL 1 MG/ML IJ SOLN
INTRAMUSCULAR | Status: AC
  Filled 2016-01-23: qty 1

## 2016-01-23 MED ORDER — HYDROMORPHONE HCL 1 MG/ML IJ SOLN
0.2500 mg | INTRAMUSCULAR | Status: DC | PRN
  Administered 2016-01-23: 0.25 mg via INTRAVENOUS

## 2016-01-23 SURGICAL SUPPLY — 51 items
BAG ZIPLOCK 12X15 (MISCELLANEOUS) ×3 IMPLANT
BIT DRILL 2.8X128 (BIT) ×2 IMPLANT
BIT DRILL 2.8X128MM (BIT) ×1
BLADE EXTENDED COATED 6.5IN (ELECTRODE) ×3 IMPLANT
BLADE SAW SAG 73X25 THK (BLADE) ×2
BLADE SAW SGTL 73X25 THK (BLADE) ×1 IMPLANT
BRUSH FEMORAL CANAL (MISCELLANEOUS) ×3 IMPLANT
CAPT HIP HEMI 1 ×3 IMPLANT
CEMENT BONE DEPUY (Cement) ×6 IMPLANT
CEMENT RESTRICTOR DEPUY SZ 3 (Cement) ×3 IMPLANT
CLOSURE WOUND 1/2 X4 (GAUZE/BANDAGES/DRESSINGS) ×2
DRAPE INCISE IOBAN 66X45 STRL (DRAPES) ×3 IMPLANT
DRAPE ORTHO SPLIT 77X108 STRL (DRAPES) ×4
DRAPE POUCH INSTRU U-SHP 10X18 (DRAPES) ×3 IMPLANT
DRAPE SURG ORHT 6 SPLT 77X108 (DRAPES) ×2 IMPLANT
DRAPE U-SHAPE 47X51 STRL (DRAPES) ×3 IMPLANT
DRSG EMULSION OIL 3X16 NADH (GAUZE/BANDAGES/DRESSINGS) ×3 IMPLANT
DRSG MEPILEX BORDER 4X12 (GAUZE/BANDAGES/DRESSINGS) ×3 IMPLANT
DRSG MEPILEX BORDER 4X4 (GAUZE/BANDAGES/DRESSINGS) ×3 IMPLANT
DRSG MEPILEX BORDER 4X8 (GAUZE/BANDAGES/DRESSINGS) ×3 IMPLANT
DURAPREP 26ML APPLICATOR (WOUND CARE) ×3 IMPLANT
ELECT REM PT RETURN 9FT ADLT (ELECTROSURGICAL) ×3
ELECTRODE REM PT RTRN 9FT ADLT (ELECTROSURGICAL) ×1 IMPLANT
EVACUATOR 1/8 PVC DRAIN (DRAIN) ×3 IMPLANT
FACESHIELD WRAPAROUND (MASK) ×9 IMPLANT
GAUZE SPONGE 4X4 12PLY STRL (GAUZE/BANDAGES/DRESSINGS) ×6 IMPLANT
GLOVE BIO SURGEON STRL SZ7.5 (GLOVE) ×3 IMPLANT
GLOVE BIO SURGEON STRL SZ8 (GLOVE) ×6 IMPLANT
GLOVE BIOGEL PI IND STRL 8 (GLOVE) ×1 IMPLANT
GLOVE BIOGEL PI INDICATOR 8 (GLOVE) ×2
GOWN STRL REUS W/TWL LRG LVL3 (GOWN DISPOSABLE) ×3 IMPLANT
GOWN STRL REUS W/TWL XL LVL3 (GOWN DISPOSABLE) ×3 IMPLANT
HANDPIECE INTERPULSE COAX TIP (DISPOSABLE) ×2
IMMOBILIZER KNEE 20 (SOFTGOODS)
IMMOBILIZER KNEE 20 THIGH 36 (SOFTGOODS) IMPLANT
KIT BASIN OR (CUSTOM PROCEDURE TRAY) ×3 IMPLANT
MANIFOLD NEPTUNE II (INSTRUMENTS) ×3 IMPLANT
PACK TOTAL JOINT (CUSTOM PROCEDURE TRAY) ×3 IMPLANT
PASSER SUT SWANSON 36MM LOOP (INSTRUMENTS) ×3 IMPLANT
POSITIONER SURGICAL ARM (MISCELLANEOUS) ×3 IMPLANT
PRESSURIZER FEMORAL UNIV (MISCELLANEOUS) ×3 IMPLANT
SET HNDPC FAN SPRY TIP SCT (DISPOSABLE) ×1 IMPLANT
STRIP CLOSURE SKIN 1/2X4 (GAUZE/BANDAGES/DRESSINGS) ×4 IMPLANT
SUT ETHIBOND NAB CT1 #1 30IN (SUTURE) ×6 IMPLANT
SUT MNCRL AB 4-0 PS2 18 (SUTURE) ×3 IMPLANT
SUT VIC AB 2-0 CT1 27 (SUTURE) ×4
SUT VIC AB 2-0 CT1 TAPERPNT 27 (SUTURE) ×2 IMPLANT
SUT VLOC 180 0 24IN GS25 (SUTURE) ×9 IMPLANT
TOWEL OR 17X26 10 PK STRL BLUE (TOWEL DISPOSABLE) ×6 IMPLANT
TOWEL OR NON WOVEN STRL DISP B (DISPOSABLE) ×3 IMPLANT
TOWER CARTRIDGE SMART MIX (DISPOSABLE) ×3 IMPLANT

## 2016-01-23 NOTE — Progress Notes (Signed)
PROGRESS NOTE    [name]  [id number] DOB: 1925-03-06 DOA: 01/21/2016 PCP: Merrilee Seashore, MD   Assessment & Plan:   Principal Problem:   Fracture of femoral neck, right, closed Active Problems:   Hypertension   Hx of thyroid cancer   Hypokalemia   Hypothyroidism   Protein-calorie malnutrition, moderate (HCC)   DDD (degenerative disc disease), lumbosacral   GERD (gastroesophageal reflux disease)   Closed displaced fracture of right femoral neck (HCC)   Fall   Odontoid fracture ([place])   Closed right hip fracture ([place])  #1 right femoral neck fracture Questionable etiology. Secondary to mechanical fall. Patient has been seen in consultation by orthopedics were planning on taking patient to the operating room today, 01/23/2016. Continue oxycodone for breakthrough pain. Patient with significant pain and as such has been started on long-acting OxyContin 15 mg twice a day. Continue Robaxin when necessary. Per orthopedics.  #2 leukocytosis Likely secondary to stress-induced emargination. No signs or symptoms of infection. WBC trending down. Follow.  #3 hypertension Continue propranolol.  #4 hypothyroidism TSH of 0.140. Decreased dose of Synthroid.  #5 odontoid fracture and possible C1 ring fracture Per admission history and physical EDP spoke with neurosurgery, Dr. Joya Salm who recommended starting patient on c-collar with outpatient follow-up.  #6 gastroesophageal reflux disease  PPI.  #7 moderate protein calorie Malnutrition Dietitian consultation pending.  #8 minimal lower extremity edema versus venous stasis changes Bilateral lower extremities with minimal edema, less erythematous, nontender to palpation. 2-D echo with EF of 60-65% with no wall motion abnormalities. Grade 1 diastolic dysfunction.    DVT prophylaxis: SCDs Code Status: DO NOT RESUSCITATE Family Communication: Updated patient at bedside Disposition Plan: Pending hip repair and PT /OT  evaluation. Likely skilled nursing facility.   Consultants:   Orthopedics: Dr.Aluisio 01/21/2016  Procedures:   Plain films of the right hip and pelvis 01/21/2016  2-D echo 01/22/2016  Chest x-ray 01/21/2016  CT head CT C-spine 01/21/2016  2 D Echo 01/22/2016  Antimicrobials:   None   Subjective: Patient states right hip significantly painful. No chest pain. No shortness of breath.  Objective: Filed Vitals:   01/22/16 0510 01/22/16 1426 01/22/16 2142 01/23/16 0631  BP: 140/60 106/43 154/49 135/50  Pulse: 99 80 80 84  Temp: 98.9 F (37.2 C) 98.5 F (36.9 C) 98.4 F (36.9 C) 98.7 F (37.1 C)  TempSrc: Oral Oral Oral Oral  Resp: 15 16 16 16   SpO2: 95% 100% 100% 96%    Intake/Output Summary (Last 24 hours) at 01/23/16 1022 Last data filed at 01/23/16 0909  Gross per 24 hour  Intake    120 ml  Output   1500 ml  Net  -1380 ml   There were no vitals filed for this visit.  Examination:  General exam: Appears calm and comfortable. C-collar. Respiratory system: Clear to auscultation. Respiratory effort normal. Cardiovascular system: S1 & S2 heard, RRR. No JVD, murmurs, rubs, gallops or clicks. Trace to 1+ bilateral lower extremity edema. Gastrointestinal system: Abdomen is nondistended, soft and nontender. No organomegaly or masses felt. Normal bowel sounds heard. Central nervous system: Alert and oriented. No focal neurological deficits. Extremities: Symmetric 5 x 5 power. Skin: Bilateral lower extremity edema with some slight erythema that is blanchable, nontender to palpation, trace edema. Some excoriations. No rashes, lesions or ulcers Psychiatry: Judgement and insight appear normal. Mood & affect appropriate.     Data Reviewed: I have personally reviewed following labs and imaging studies  CBC:  Recent Labs Lab  01/21/16 1829 01/22/16 0436 01/23/16 0418  WBC 13.9* 7.3 6.2  NEUTROABS 12.3*  --   --   HGB 12.1 11.0* 10.5*  HCT 37.5 34.0* 33.2*  MCV  93.1 93.4 95.1  PLT 288 259 [id number]   Basic Metabolic Panel:  Recent Labs Lab 01/21/16 1829 01/22/16 0436 01/23/16 0418  NA 134* 135 134*  K 3.4* 3.5 4.2  CL 97* 100* 100*  CO2 26 28 30   GLUCOSE 133* 115* 118*  BUN 18 16 16   CREATININE 0.91 0.88 1.00  CALCIUM 8.6* 7.9* 8.0*  MG  --  2.2  --    GFR: CrCl cannot be calculated (Unknown ideal weight.). Liver Function Tests: No results for input(s): AST, ALT, ALKPHOS, BILITOT, PROT, ALBUMIN in the last 168 hours. No results for input(s): LIPASE, AMYLASE in the last 168 hours. No results for input(s): AMMONIA in the last 168 hours. Coagulation Profile:  Recent Labs Lab 01/21/16 2252  INR 1.04   Cardiac Enzymes: No results for input(s): CKTOTAL, CKMB, CKMBINDEX, TROPONINI in the last 168 hours. BNP (last 3 results) No results for input(s): PROBNP in the last 8760 hours. HbA1C: No results for input(s): HGBA1C in the last 72 hours. CBG: No results for input(s): GLUCAP in the last 168 hours. Lipid Profile: No results for input(s): CHOL, HDL, LDLCALC, TRIG, CHOLHDL, LDLDIRECT in the last 72 hours. Thyroid Function Tests:  Recent Labs  01/22/16 0436  TSH 0.140*   Anemia Panel:  Recent Labs  01/23/16 0916  RETICCTPCT 1.0   Sepsis Labs: No results for input(s): PROCALCITON, LATICACIDVEN in the last 168 hours.  Recent Results (from the past 240 hour(s))  Surgical PCR screen     Status: None   Collection Time: 01/22/16  5:39 AM  Result Value Ref Range Status   MRSA, PCR NEGATIVE NEGATIVE Final   Staphylococcus aureus NEGATIVE NEGATIVE Final    Comment:        The Xpert SA Assay (FDA approved for NASAL specimens in patients over 37 years of age), is one component of a comprehensive surveillance program.  Test performance has been validated by [facility] for patients greater than or equal to 23 year old. It is not intended to diagnose infection nor to guide or monitor treatment.          Radiology  Studies: Dg Chest 1 View  01/21/2016  CLINICAL DATA:  Fall, right hip pain. EXAM: CHEST 1 VIEW COMPARISON:  Chest x-rays dated 02/18/2014 and 01/2015 2013. FINDINGS: Cardiomediastinal silhouette is stable in size and configuration. Lungs appear hyperexpanded. Lungs are clear. No pleural effusion or pneumothorax seen. Osseous structures about the chest are unremarkable. IMPRESSION: 1. Lungs appear hyperexpanded suggesting COPD/emphysema. 2. Lungs are clear and there is no evidence of acute cardiopulmonary abnormality. Electronically Signed   By: Franki Cabot M.D.   On: 01/21/2016 18:49   Ct Head Wo Contrast  01/21/2016  CLINICAL DATA:  Fall, right hip pain. EXAM: CT HEAD WITHOUT CONTRAST CT CERVICAL SPINE WITHOUT CONTRAST TECHNIQUE: Multidetector CT imaging of the head and cervical spine was performed following the standard protocol without intravenous contrast. Multiplanar CT image reconstructions of the cervical spine were also generated. COMPARISON:  None. FINDINGS: CT HEAD FINDINGS There is mild generalized brain atrophy with commensurate dilatation of the ventricles and sulci. Mild chronic small vessel ischemic change noted within the deep periventricular white matter regions bilaterally. There is no mass, hemorrhage, edema or other evidence of acute parenchymal abnormality. No extra-axial hemorrhage. No skull fracture.  Visualized upper paranasal sinuses are clear. CT CERVICAL SPINE FINDINGS Extensive degenerative change throughout the cervical spine. There is a slightly displaced fracture within the odontoid process, with slight posterior tilt of the odontoid, with surrounding erosive change that may indicate chronic fracture displacement. There is also a nondisplaced fracture within the left lamina of C1 which is of uncertain age. Fracture diastasis, with small adjacent bony fragments, also noted within the right lamina of C1 which is also of uncertain age but more likely chronic, possibly congenital. No  other fracture line or displaced fracture fragment is identified within the cervical spine. Facet joints of the C2 through T1 are normally aligned. Atherosclerotic calcifications are noted at each carotid bulb region. Paravertebral soft tissues are otherwise unremarkable. IMPRESSION: 1. Slightly displaced fracture within the C2 odontoid process, with surrounding erosive change that may indicate chronic fracture displacement. There is a slight posterior tilt of the odontoid and up to 3 mm cortical displacement anteriorly. No associated central canal narrowing. 2. Additional nondisplaced fracture within the left lamina of the C1 vertebral body, of uncertain age. Fracture diastasis within the right lamina of C1 is also of uncertain age but has a more chronic appearance, and may be congenital. 3. No acute intracranial findings. No intracranial mass, hemorrhage or edema. These results were called by telephone at the time of interpretation on 01/21/2016 at 7:15 pm to Dr. Brantley Stage , who verbally acknowledged these results. Electronically Signed   By: Franki Cabot M.D.   On: 01/21/2016 19:17   Ct Cervical Spine Wo Contrast  01/21/2016  CLINICAL DATA:  Fall, right hip pain. EXAM: CT HEAD WITHOUT CONTRAST CT CERVICAL SPINE WITHOUT CONTRAST TECHNIQUE: Multidetector CT imaging of the head and cervical spine was performed following the standard protocol without intravenous contrast. Multiplanar CT image reconstructions of the cervical spine were also generated. COMPARISON:  None. FINDINGS: CT HEAD FINDINGS There is mild generalized brain atrophy with commensurate dilatation of the ventricles and sulci. Mild chronic small vessel ischemic change noted within the deep periventricular white matter regions bilaterally. There is no mass, hemorrhage, edema or other evidence of acute parenchymal abnormality. No extra-axial hemorrhage. No skull fracture. Visualized upper paranasal sinuses are clear. CT CERVICAL SPINE FINDINGS  Extensive degenerative change throughout the cervical spine. There is a slightly displaced fracture within the odontoid process, with slight posterior tilt of the odontoid, with surrounding erosive change that may indicate chronic fracture displacement. There is also a nondisplaced fracture within the left lamina of C1 which is of uncertain age. Fracture diastasis, with small adjacent bony fragments, also noted within the right lamina of C1 which is also of uncertain age but more likely chronic, possibly congenital. No other fracture line or displaced fracture fragment is identified within the cervical spine. Facet joints of the C2 through T1 are normally aligned. Atherosclerotic calcifications are noted at each carotid bulb region. Paravertebral soft tissues are otherwise unremarkable. IMPRESSION: 1. Slightly displaced fracture within the C2 odontoid process, with surrounding erosive change that may indicate chronic fracture displacement. There is a slight posterior tilt of the odontoid and up to 3 mm cortical displacement anteriorly. No associated central canal narrowing. 2. Additional nondisplaced fracture within the left lamina of the C1 vertebral body, of uncertain age. Fracture diastasis within the right lamina of C1 is also of uncertain age but has a more chronic appearance, and may be congenital. 3. No acute intracranial findings. No intracranial mass, hemorrhage or edema. These results were called by telephone at the  time of interpretation on 01/21/2016 at 7:15 pm to Dr. Brantley Stage , who verbally acknowledged these results. Electronically Signed   By: Franki Cabot M.D.   On: 01/21/2016 19:17   Dg Hip Unilat  With Pelvis 2-3 Views Right  01/21/2016  CLINICAL DATA:  Fall, right hip pain. EXAM: DG HIP (WITH OR WITHOUT PELVIS) 2-3V RIGHT COMPARISON:  None. FINDINGS: Markedly displaced fracture within the subcapital region of the right femoral neck. Right femoral head remains well positioned relative to the  acetabulum. IMPRESSION: Significantly displaced fracture of the right femoral neck, subcapital region. Electronically Signed   By: Franki Cabot M.D.   On: 01/21/2016 18:48        Scheduled Meds: . brimonidine  1 drop Both Eyes BID  . chlorhexidine  60 mL Topical Once  . clindamycin (CLEOCIN) IV  900 mg Intravenous On Call to OR  . dorzolamide-timolol  1 drop Both Eyes BID  . gabapentin  600 mg Oral TID  . latanoprost  1 drop Both Eyes QHS  . levothyroxine  137 mcg Oral QAC breakfast  . oxyCODONE  15 mg Oral Q12H  . oxymetazoline  1 spray Each Nare BID  . pantoprazole  40 mg Oral Daily  . polyethylene glycol  17 g Oral BID  . povidone-iodine  2 application Topical Once  . propranolol  20 mg Oral Daily  . senna-docusate  1 tablet Oral QHS   Continuous Infusions:    LOS: 2 days    Time spent: 26 minutes    Abbigayle Toole, MD [facility] Pager [phone]  If 7PM-7AM, please contact night-coverage [email or web address] Password [email or web address] 01/23/2016, 10:22 AM

## 2016-01-23 NOTE — Brief Op Note (Signed)
01/21/2016 - 01/23/2016  2:35 PM  PATIENT:  [name]  82 y.o. female  PRE-OPERATIVE DIAGNOSIS:  right hip fracture  POST-OPERATIVE DIAGNOSIS:  right hip fracture  PROCEDURE:  Procedure(s) with comments: ARTHROPLASTY BIPOLAR HIP (HEMIARTHROPLASTY) (Right) - Depuy  SURGEON:  Surgeon(s) and Role:    * Gaynelle Arabian, MD - Primary  PHYSICIAN ASSISTANT:   ASSISTANTS: Molli Barrows, PA-C   ANESTHESIA:   general  EBL:  Total I/O In: -  Out: 1100 [Urine:900; Blood:200]  BLOOD ADMINISTERED:none  DRAINS: (Medium) Hemovact drain(s) in the right hip with  Suction Open   LOCAL MEDICATIONS USED:  MARCAINE     COUNTS:  YES  TOURNIQUET:  * No tourniquets in log *  DICTATION: .Other Dictation: Dictation Number [id number]  PLAN OF CARE: Admit to inpatient   PATIENT DISPOSITION:  PACU - hemodynamically stable.

## 2016-01-23 NOTE — Anesthesia Procedure Notes (Signed)
Procedure Name: Intubation Date/Time: 01/23/2016 1:27 PM Performed by: West Pugh Pre-anesthesia Checklist: Patient identified, Emergency Drugs available, Suction available, Patient being monitored and Timeout performed Patient Re-evaluated:Patient Re-evaluated prior to inductionOxygen Delivery Method: Circle system utilized Preoxygenation: Pre-oxygenation with 100% oxygen Intubation Type: IV induction Ventilation: Mask ventilation without difficulty Laryngoscope size: Blind intubation. Tube type: Oral Tube size: 7.0 mm Number of attempts: 1 Airway Equipment and Method: Stylet Placement Confirmation: positive ETCO2,  CO2 detector and breath sounds checked- equal and bilateral Secured at: 21 cm Tube secured with: Tape Dental Injury: Teeth and Oropharynx as per pre-operative assessment  Comments: Patient was intubated by Dr Orene Desanctis with one attempt and blind intubation. Neck and head were maintained in neutral alignment during entire procedure.

## 2016-01-23 NOTE — Anesthesia Postprocedure Evaluation (Signed)
Anesthesia Post Note  Patient: SAKIYA HULM  Procedure(s) Performed: Procedure(s) (LRB): ARTHROPLASTY BIPOLAR HIP (HEMIARTHROPLASTY) (Right)  Patient location during evaluation: PACU Anesthesia Type: General Level of consciousness: awake and alert Pain management: pain level controlled Vital Signs Assessment: post-procedure vital signs reviewed and stable Respiratory status: spontaneous breathing, nonlabored ventilation, respiratory function stable and patient connected to nasal cannula oxygen Cardiovascular status: blood pressure returned to baseline and stable Postop Assessment: no signs of nausea or vomiting Anesthetic complications: no (Neck stable ,collar in place , no problems)    Last Vitals:  Filed Vitals:   01/23/16 1530 01/23/16 1545  BP: 181/71 155/73  Pulse: 72 73  Temp:    Resp: 14 11    Last Pain:  Filed Vitals:   01/23/16 1551  PainSc: 8                  Caitlain Tweed,JAMES TERRILL

## 2016-01-23 NOTE — Transfer of Care (Signed)
Immediate Anesthesia Transfer of Care Note  Patient: [name]  Procedure(s) Performed: Procedure(s) with comments: ARTHROPLASTY BIPOLAR HIP (HEMIARTHROPLASTY) (Right) - Depuy  Patient Location: PACU  Anesthesia Type:General  Level of Consciousness:  sedated, patient cooperative and responds to stimulation  Airway & Oxygen Therapy:Patient Spontanous Breathing and Patient connected to face mask oxgen  Post-op Assessment:  Report given to PACU RN and Post -op Vital signs reviewed and stable  Post vital signs:  Reviewed and stable  Last Vitals:  Filed Vitals:   01/22/16 2142 01/23/16 0631  BP: 154/49 135/50  Pulse: 80 84  Temp: 36.9 C 37.1 C  Resp: 16 16    Complications: No apparent anesthesia complications

## 2016-01-23 NOTE — Progress Notes (Signed)
Initial Nutrition Assessment  DOCUMENTATION CODES:   Non-severe (moderate) malnutrition in context of chronic illness  INTERVENTION:  -RD to continue to monitor -Pt declined ONS  NUTRITION DIAGNOSIS:   Malnutrition related to chronic illness as evidenced by moderate depletions of muscle mass, severe depletion of muscle mass, moderate depletion of body fat.  GOAL:   Patient will meet greater than or equal to 90% of their needs  MONITOR:   PO intake, Labs, Diet advancement, Skin, I & O's  REASON FOR ASSESSMENT:   Consult Hip fracture protocol  ASSESSMENT:   ZHARIA PANCIERA is a 82 y.o. female with medical history significant of hypertension, glaucoma, macular degeneration, chronic back pain, GERD, hypothyroidism, thyroid cancer, who presents with right hip pain and neck pain after fall.  Spoke with Ms. Lullo, family at bedside. She is in good spirits s/p surgery. She endorses good appetite PTA -> eating 3 meals a day, no concerns. Prior to surgery she had pot pie twice which she claimed was great. Doesn't seem to suffer from issues tasting food. Denies wt loss.  Nutrition-Focused physical exam completed. Findings are moderate fat depletion, moderate-severe muscle depletion, and no edema.   She was sipping a ginger ale during my visit. Tolerating no issues. Has now been advanced to regular diet.   She has no height and weight in chart -> states ht and wt to be 5'1 - 147#  Labs and Medications reviewed: Na 134, Ca 8.0 Reglan PRN NS 75 mL/hr   Diet Order:  Diet regular Room service appropriate?: Yes; Fluid consistency:: Thin  Skin:  Wound (see comment) (Closed incision @ hip)  Last BM:  5/20  Height:   Ht Readings from Last 1 Encounters:  02/18/14 5\' 4"  (1.626 m)    Weight:   Wt Readings from Last 1 Encounters:  02/18/14 116 lb 6.4 oz (52.799 kg)    Ideal Body Weight:  47.72  BMI:  There is no weight on file to calculate BMI.  -> Based on stated ht and wt  -> 28.17  Estimated Nutritional Needs:   Kcal:  1350-1650 calories  Protein:  65-75 grams  Fluid:  >/= 1.35 L  EDUCATION NEEDS:   No education needs identified at this time  Satira Anis. Christmas Faraci, MS, RD LDN Inpatient Clinical Dietitian Pager [phone]

## 2016-01-23 NOTE — Interval H&P Note (Signed)
History and Physical Interval Note:  01/23/2016 12:23 PM  [name]  has presented today for surgery, with the diagnosis of right hip fracture  The various methods of treatment have been discussed with the patient and family. After consideration of risks, benefits and other options for treatment, the patient has consented to  Procedure(s) with comments: [place] (HEMIARTHROPLASTY) (Right) - Depuy as a surgical intervention .  The patient's history has been reviewed, patient examined, no change in status, stable for surgery.  I have reviewed the patient's chart and labs.  Questions were answered to the patient's satisfaction.     Gearlean Alf

## 2016-01-23 NOTE — Progress Notes (Signed)
OT Cancellation Note  Patient Details Name: [name] MRN: [id number] DOB: Jun 22, 1925   Cancelled Treatment:    Reason Eval/Treat Not Completed: Medical issues which prohibited therapy  . Pt scheduled to have surgery. will sign off and await new orders from ortho MD. Thanks Kari Baars, [place] Payton Mccallum D 01/23/2016, 9:58 AM

## 2016-01-23 NOTE — H&P (View-Only) (Signed)
Reason for Consult:Right Femoral neck fracture Referring Physician: Dr. Bonnita Nasuti is an 82 y.o. female.  HPI:  82 yo female who was at home earlier this evening and was getting up from her table when she turned and tripped on her dog, falling and landing on her right side. She did not have any prodromal symptoms leading to the fall. She had immediate right hip pain and felt a pop. She also complains of neck pain and hit her head without LOC. She was unable to get up and called EMS which brought her to the ED. Evaluation showed displaced right femoral neck fracture. She also had a negative head CT but did have a fracture at C1 which may be a chronic injury (neurosurgery consulted by ED). She denies any numbness or tingling in her right leg. Of note, she saw her PCP yesterday due to bilateral leg swelling and was prescribed a diuretic, but did not pick it up at the pharmacy yet.  Past Medical History  Diagnosis Date  . Angina   . Headache(784.0)   . Hypoglycemia   . Fracture 2008    back  . Macular degeneration of both eyes   . Glaucoma   . Hypothyroidism   . Blood transfusion   . Anemia   . GERD (gastroesophageal reflux disease)   . H/O hiatal hernia   . Arthritis   . Chronic back pain   . Thyroid cancer ([place]) 1986  . Shortness of breath 11/08/11    "here lately, all the time"  . Exertional dyspnea 01/17/12  . Osteomyelitis ([place]) 11/2011    right knee  . Hypertension   . Pneumonia 01/2010  . Metabolic acidemia 02/05/5408    Past Surgical History  Procedure Laterality Date  . Thyroidectomy  1986  . Shoulder arthroscopy w/ rotator cuff repair    . Trigger finger release      right  thumb  . Abdominal hysterectomy  1963  . Carpal tunnel release      bilaterally  . Tonsillectomy and adenoidectomy    . Appendectomy    . Dilation and curettage of uterus    . Cataract extraction w/ intraocular lens  implant, bilateral    . Joint replacement  2005, 2006, 2012, 11/10/11     left; left; right; right; right  . Goiter removed  1980's    "when I was young"  . Fracture surgery      "broke back"  . Hand or  09/2011    "left hand; bones were coming together"  . I&d knee with poly exchange  11/10/2011    Procedure: IRRIGATION AND DEBRIDEMENT KNEE WITH POLY EXCHANGE;  Surgeon: Yvette Rack., MD;  Location: [facility];  Service: Orthopedics;  Laterality: Right;  . Post polyethelene exchange  11/09/11    RLE  . Back surgery      "3 back OR's;"; S/P "fractured back 2008"  . Peripherally inserted central catheter insertion  11/2011    "removed 12/2011; LUE"  . Total knee revision  04/28/2012    Procedure: TOTAL KNEE REVISION;  Surgeon: Rudean Haskell, MD;  Location: [facility];  Service: Orthopedics;  Laterality: Right;    History reviewed. No pertinent family history.  Social History:  reports that she has never smoked. She has never used smokeless tobacco. She reports that she does not drink alcohol or use illicit drugs.  Allergies:  Allergies  Allergen Reactions  . Demerol Shortness Of Breath  . Penicillins  Swelling    "in the face" Has patient had a PCN reaction causing immediate rash, facial/tongue/throat swelling, SOB or lightheadedness with hypotension: yes Has patient had a PCN reaction causing severe rash involving mucus membranes or skin necrosis: no Has patient had a PCN reaction that required hospitalization: no  Has patient had a PCN reaction occurring within the last 10 years: no If all of the above answers are "NO", then may proceed with Cephalosporin use.   . Corticosteroids Other (See Comments)    DIZZINESS, HEADACHE  . Lorazepam Anxiety  . Tetracyclines & Related Rash  . Vancomycin Rash    Medications: I have reviewed the patient's current medications.  Results for orders placed or performed during the hospital encounter of 01/21/16 (from the past 48 hour(s))  CBC with Differential     Status: Abnormal   Collection Time: 01/21/16  6:29 PM  Result  Value Ref Range   WBC 13.9 (H) 4.0 - 10.5 K/uL   RBC 4.03 3.87 - 5.11 MIL/uL   Hemoglobin 12.1 12.0 - 15.0 g/dL   HCT 37.5 36.0 - 46.0 %   MCV 93.1 78.0 - 100.0 fL   MCH 30.0 26.0 - 34.0 pg   MCHC 32.3 30.0 - 36.0 g/dL   RDW 13.8 11.5 - 15.5 %   Platelets 288 150 - 400 K/uL   Neutrophils Relative % 89 %   Lymphocytes Relative 4 %   Monocytes Relative 7 %   Eosinophils Relative 0 %   Basophils Relative 0 %   Neutro Abs 12.3 (H) 1.7 - 7.7 K/uL   Lymphs Abs 0.6 (L) 0.7 - 4.0 K/uL   Monocytes Absolute 1.0 0.1 - 1.0 K/uL   Eosinophils Absolute 0.0 0.0 - 0.7 K/uL   Basophils Absolute 0.0 0.0 - 0.1 K/uL   Smear Review MORPHOLOGY UNREMARKABLE   Basic metabolic panel     Status: Abnormal   Collection Time: 01/21/16  6:29 PM  Result Value Ref Range   Sodium 134 (L) 135 - 145 mmol/L   Potassium 3.4 (L) 3.5 - 5.1 mmol/L   Chloride 97 (L) 101 - 111 mmol/L   CO2 26 22 - 32 mmol/L   Glucose, Bld 133 (H) 65 - 99 mg/dL   BUN 18 6 - 20 mg/dL   Creatinine, Ser 0.91 0.44 - 1.00 mg/dL   Calcium 8.6 (L) 8.9 - 10.3 mg/dL   GFR calc non Af Amer 55 (L) >60 mL/min   GFR calc Af Amer >60 >60 mL/min    Comment: (NOTE) The eGFR has been calculated using the CKD EPI equation. This calculation has not been validated in all clinical situations. eGFR's persistently <60 mL/min signify possible Chronic Kidney Disease.    Anion gap 11 5 - 15  Urinalysis, Routine w reflex microscopic (not at [facility])     Status: Abnormal   Collection Time: 01/21/16  8:40 PM  Result Value Ref Range   Color, Urine YELLOW YELLOW   APPearance CLOUDY (A) CLEAR   Specific Gravity, Urine 1.011 1.005 - 1.030   pH 6.0 5.0 - 8.0   Glucose, UA NEGATIVE NEGATIVE mg/dL   Hgb urine dipstick TRACE (A) NEGATIVE   Bilirubin Urine NEGATIVE NEGATIVE   Ketones, ur NEGATIVE NEGATIVE mg/dL   Protein, ur NEGATIVE NEGATIVE mg/dL   Nitrite NEGATIVE NEGATIVE   Leukocytes, UA NEGATIVE NEGATIVE  Urine microscopic-add on     Status: Abnormal    Collection Time: 01/21/16  8:40 PM  Result Value Ref Range  Squamous Epithelial / LPF NONE SEEN NONE SEEN   WBC, UA 0-5 0 - 5 WBC/hpf   RBC / HPF 0-5 0 - 5 RBC/hpf   Bacteria, UA MANY (A) NONE SEEN    Dg Chest 1 View  01/21/2016  CLINICAL DATA:  Fall, right hip pain. EXAM: CHEST 1 VIEW COMPARISON:  Chest x-rays dated 02/18/2014 and 01/2015 2013. FINDINGS: Cardiomediastinal silhouette is stable in size and configuration. Lungs appear hyperexpanded. Lungs are clear. No pleural effusion or pneumothorax seen. Osseous structures about the chest are unremarkable. IMPRESSION: 1. Lungs appear hyperexpanded suggesting COPD/emphysema. 2. Lungs are clear and there is no evidence of acute cardiopulmonary abnormality. Electronically Signed   By: Franki Cabot M.D.   On: 01/21/2016 18:49   Ct Head Wo Contrast  01/21/2016  CLINICAL DATA:  Fall, right hip pain. EXAM: CT HEAD WITHOUT CONTRAST CT CERVICAL SPINE WITHOUT CONTRAST TECHNIQUE: Multidetector CT imaging of the head and cervical spine was performed following the standard protocol without intravenous contrast. Multiplanar CT image reconstructions of the cervical spine were also generated. COMPARISON:  None. FINDINGS: CT HEAD FINDINGS There is mild generalized brain atrophy with commensurate dilatation of the ventricles and sulci. Mild chronic small vessel ischemic change noted within the deep periventricular white matter regions bilaterally. There is no mass, hemorrhage, edema or other evidence of acute parenchymal abnormality. No extra-axial hemorrhage. No skull fracture. Visualized upper paranasal sinuses are clear. CT CERVICAL SPINE FINDINGS Extensive degenerative change throughout the cervical spine. There is a slightly displaced fracture within the odontoid process, with slight posterior tilt of the odontoid, with surrounding erosive change that may indicate chronic fracture displacement. There is also a nondisplaced fracture within the left lamina of C1  which is of uncertain age. Fracture diastasis, with small adjacent bony fragments, also noted within the right lamina of C1 which is also of uncertain age but more likely chronic, possibly congenital. No other fracture line or displaced fracture fragment is identified within the cervical spine. Facet joints of the C2 through T1 are normally aligned. Atherosclerotic calcifications are noted at each carotid bulb region. Paravertebral soft tissues are otherwise unremarkable. IMPRESSION: 1. Slightly displaced fracture within the C2 odontoid process, with surrounding erosive change that may indicate chronic fracture displacement. There is a slight posterior tilt of the odontoid and up to 3 mm cortical displacement anteriorly. No associated central canal narrowing. 2. Additional nondisplaced fracture within the left lamina of the C1 vertebral body, of uncertain age. Fracture diastasis within the right lamina of C1 is also of uncertain age but has a more chronic appearance, and may be congenital. 3. No acute intracranial findings. No intracranial mass, hemorrhage or edema. These results were called by telephone at the time of interpretation on 01/21/2016 at 7:15 pm to Dr. Brantley Stage , who verbally acknowledged these results. Electronically Signed   By: Franki Cabot M.D.   On: 01/21/2016 19:17   Ct Cervical Spine Wo Contrast  01/21/2016  CLINICAL DATA:  Fall, right hip pain. EXAM: CT HEAD WITHOUT CONTRAST CT CERVICAL SPINE WITHOUT CONTRAST TECHNIQUE: Multidetector CT imaging of the head and cervical spine was performed following the standard protocol without intravenous contrast. Multiplanar CT image reconstructions of the cervical spine were also generated. COMPARISON:  None. FINDINGS: CT HEAD FINDINGS There is mild generalized brain atrophy with commensurate dilatation of the ventricles and sulci. Mild chronic small vessel ischemic change noted within the deep periventricular white matter regions bilaterally. There is no  mass, hemorrhage, edema or other  evidence of acute parenchymal abnormality. No extra-axial hemorrhage. No skull fracture. Visualized upper paranasal sinuses are clear. CT CERVICAL SPINE FINDINGS Extensive degenerative change throughout the cervical spine. There is a slightly displaced fracture within the odontoid process, with slight posterior tilt of the odontoid, with surrounding erosive change that may indicate chronic fracture displacement. There is also a nondisplaced fracture within the left lamina of C1 which is of uncertain age. Fracture diastasis, with small adjacent bony fragments, also noted within the right lamina of C1 which is also of uncertain age but more likely chronic, possibly congenital. No other fracture line or displaced fracture fragment is identified within the cervical spine. Facet joints of the C2 through T1 are normally aligned. Atherosclerotic calcifications are noted at each carotid bulb region. Paravertebral soft tissues are otherwise unremarkable. IMPRESSION: 1. Slightly displaced fracture within the C2 odontoid process, with surrounding erosive change that may indicate chronic fracture displacement. There is a slight posterior tilt of the odontoid and up to 3 mm cortical displacement anteriorly. No associated central canal narrowing. 2. Additional nondisplaced fracture within the left lamina of the C1 vertebral body, of uncertain age. Fracture diastasis within the right lamina of C1 is also of uncertain age but has a more chronic appearance, and may be congenital. 3. No acute intracranial findings. No intracranial mass, hemorrhage or edema. These results were called by telephone at the time of interpretation on 01/21/2016 at 7:15 pm to Dr. Brantley Stage , who verbally acknowledged these results. Electronically Signed   By: Franki Cabot M.D.   On: 01/21/2016 19:17   Dg Hip Unilat  With Pelvis 2-3 Views Right  01/21/2016  CLINICAL DATA:  Fall, right hip pain. EXAM: DG HIP (WITH OR WITHOUT  PELVIS) 2-3V RIGHT COMPARISON:  None. FINDINGS: Markedly displaced fracture within the subcapital region of the right femoral neck. Right femoral head remains well positioned relative to the acetabulum. IMPRESSION: Significantly displaced fracture of the right femoral neck, subcapital region. Electronically Signed   By: Franki Cabot M.D.   On: 01/21/2016 18:48    ROS Blood pressure 184/90, pulse 92, temperature 98.4 F (36.9 C), temperature source Oral, resp. rate 18, SpO2 94 %. Physical Exam Physical Examination: General appearance - alert, well appearing, and in no distress Mental status - alert, oriented to person, place, and time Chest - clear to auscultation, no wheezes, rales or rhonchi, symmetric air entry Heart - normal rate, regular rhythm, normal S1, S2, no murmurs, rubs, clicks or gallops Abdomen - soft, nontender, nondistended, no masses or organomegaly Neurological - alert, oriented, normal speech, no focal findings or movement disorder noted RLE shortened and externally rotated, Pain on any attempted ROM of right hip; 2+ edema BLE with erythema lower legs; no ulcerations; knee shows scar from previous TKA revision; tender right lateral hip; pelvis stable to compression /distraction  X-ray-- Displaced right femoral neck fracture   Assessment/Plan: Right femoral neck fracture- Patient will require a right hip hemiarthroplasty as definitive treatment. She has no arthritis and had no hip pain prior to this, thus does not need a THA. Discussed surgery in detail and she wants to proceed. Plan OR on Monday 5/22. She needs to have the BLE edema addressed by medical team and ensure she does not have CHF prior to surgery.   Gearlean Alf 01/21/2016, 9:11 PM

## 2016-01-23 NOTE — Progress Notes (Signed)
PT Cancellation Note  Patient Details Name: [name] MRN: [id number] DOB: [birth year]   Cancelled Treatment:    Reason Eval/Treat Not Completed: Patient not medically ready. Pt scheduled to have surgery. will sign off and await new orders from ortho MD. Thanks.    Weston Anna, MPT Pager: [phone]

## 2016-01-23 NOTE — Anesthesia Preprocedure Evaluation (Addendum)
Anesthesia Evaluation  Patient identified by MRN, date of birth, ID band Patient awake    Reviewed: Allergy & Precautions, NPO status , Patient's Chart, lab work & pertinent test results  Airway Mallampati: II   Neck ROM: Limited   Comment: Pt in collar , cannot extend but can flex 2-3 cm forward c pain, Sensory and motor intact.  Dental  (+) Teeth Intact, Dental Advisory Given   Pulmonary shortness of breath,    breath sounds clear to auscultation       Cardiovascular hypertension,  Rhythm:Regular Rate:Normal    - Left ventricle: The cavity size was normal. Wall thickness was normal. Systolic function was normal. The estimated ejection fraction was in the range of 60% to 65%. Wall motion was normal; there were no regional wall motion abnormalities. Doppler parameters are consistent with abnormal left ventricular relaxation (grade 1 diastolic dysfunction). - Aortic valve: Trileaflet; mildly thickened, mildly calcified leaflets. - Left atrium: The atrium was mildly dilated. Volume/bsa, ES, (1-plane Simpson&'s, A2C): 36.4 ml/m^2. - Pulmonary arteries: Systolic pressure was mildly to moderately increased.  Transthoracic echocardiography. M-mode, complete 2D, spectral Doppler, and color Doppler. Birthdate: Patient birthdate: [birth year]. Age: Patient is 82 yr old. Sex: Gender: female. BMI: 20 kg/m^2. Blood pressure: 140/60 Patient status: Inpatient. Study date: Study date: 01/22/2016. Study time: 01:01 PM. Location: Bedside.  -------------------------------------------------------------------  ------------------------------------------------------------------- Left ventricle: The cavity size was normal. Wall thickness was normal. Systolic function was normal. The estimated ejection fraction was in the range of 60% to 65%. Wall motion was normal; there were no regional wall motion abnormalities. Doppler parameters are  consistent with abnormal left ventricular relaxation (grade 1 diastolic dysfunction).  ------------------------------------------------------------------- Aortic valve: Trileaflet; mildly thickened, mildly calcified leaflets. Mobility was not restricted. Doppler: Transvalvular velocity was within the normal range. There was no stenosis. There was no regurgitation.  ------------------------------------------------------------------- Aorta: Aortic root: The aortic root was normal in size.  ------------------------------------------------------------------- Mitral valve: Structurally normal valve. Mobility was not restricted. Doppler: Transvalvular velocity was within the normal range. There was no evidence for stenosis. There was no regurgitation. Peak gradient (D): 3 mm Hg.     Neuro/Psych  Headaches,  Neuromuscular disease    GI/Hepatic hiatal hernia, GERD  ,  Endo/Other    Renal/GU      Musculoskeletal  (+) Arthritis ,   Abdominal   Peds  Hematology  (+) anemia ,   Anesthesia Other Findings   Reproductive/Obstetrics                           Anesthesia Physical Anesthesia Plan  ASA: III  Anesthesia Plan: General   Post-op Pain Management:    Induction: Intravenous  Airway Management Planned: Video Laryngoscope Planned and Oral ETT  Additional Equipment:   Intra-op Plan:   Post-operative Plan: Extubation in OR  Informed Consent: I have reviewed the patients History and Physical, chart, labs and discussed the procedure including the risks, benefits and alternatives for the proposed anesthesia with the patient or authorized representative who has indicated his/her understanding and acceptance.   Dental advisory given  Plan Discussed with: CRNA and Surgeon  Anesthesia Plan Comments: (She has had multiple back surg and fusion . Now in  C-collar c perhaps old or new odontoid fx, will plan Gen anesth , keep head in neutral position  and intubate c Glide)       Anesthesia Quick Evaluation

## 2016-01-24 LAB — CBC
HCT: 29 % — ABNORMAL LOW (ref 36.0–46.0)
HEMOGLOBIN: 9.3 g/dL — AB (ref 12.0–15.0)
MCH: 30.6 pg (ref 26.0–34.0)
MCHC: 32.1 g/dL (ref 30.0–36.0)
MCV: 95.4 fL (ref 78.0–100.0)
PLATELETS: 212 10*3/uL (ref 150–400)
RBC: 3.04 MIL/uL — AB (ref 3.87–5.11)
RDW: 13.8 % (ref 11.5–15.5)
WBC: 9.5 10*3/uL (ref 4.0–10.5)

## 2016-01-24 LAB — BASIC METABOLIC PANEL
ANION GAP: 7 (ref 5–15)
BUN: 15 mg/dL (ref 6–20)
CALCIUM: 8 mg/dL — AB (ref 8.9–10.3)
CO2: 29 mmol/L (ref 22–32)
CREATININE: 0.79 mg/dL (ref 0.44–1.00)
Chloride: 98 mmol/L — ABNORMAL LOW (ref 101–111)
GLUCOSE: 134 mg/dL — AB (ref 65–99)
Potassium: 4.4 mmol/L (ref 3.5–5.1)
Sodium: 134 mmol/L — ABNORMAL LOW (ref 135–145)

## 2016-01-24 MED ORDER — SODIUM CHLORIDE 0.9 % IV SOLN
510.0000 mg | Freq: Once | INTRAVENOUS | Status: AC
  Administered 2016-01-24: 510 mg via INTRAVENOUS
  Filled 2016-01-24: qty 17

## 2016-01-24 MED ORDER — HYDROCODONE-ACETAMINOPHEN 5-325 MG PO TABS
1.0000 | ORAL_TABLET | ORAL | Status: DC | PRN
  Administered 2016-01-24 – 2016-01-26 (×11): 2 via ORAL
  Filled 2016-01-24 (×11): qty 2

## 2016-01-24 NOTE — Progress Notes (Signed)
Occupational Therapy Treatment Note:  Pt instructed in use of AE for LB ADLs.  She is unable to use it effectively due to visual deficits.  She states spouse will be able to assist her at discharge.  She is able to recall 2/3 post THA precautions.  Min guard assist for functional mobility     01/24/16 1502  OT Visit Information  Last OT Received On 01/24/16  Assistance Needed +1  History of Present Illness Pt is an 82 year old female s/p fall at home resulting in right femoral neck fracture and odontoid fracture.  Pt currently s/p R hip hemiarthroplasty with posterior hip precautions and has soft c-collar for odontoid fracture  Precautions  Precautions Cervical;Posterior Hip  Precaution Comments posterior hip precautions and cervical precautions reviewed with pt  Required Braces or Orthoses Cervical Brace  Cervical Brace Soft collar  Pain Assessment  Pain Assessment Faces  Faces Pain Scale 4  Pain Location Rt hip   Pain Descriptors / Indicators Aching;Grimacing  Pain Intervention(s) Monitored during session  Cognition  Arousal/Alertness Awake/alert  Behavior During Therapy WFL for tasks assessed/performed  Overall Cognitive Status Within Functional Limits for tasks assessed  ADL  Toilet Transfer Min guard;Ambulation;Comfort height toilet;Grab bars;RW  Toileting- Clothing Manipulation and Hygiene Minimal assistance;Sit to/from stand  Functional mobility during ADLs Min guard;Minimal assistance;Rolling walker  General ADL Comments Pt instructed in use of AE for LB ADLs. She is unable to see the prongs on the reacher adequately.due to glaucoma.  She reports spouse will assist her as needed at discharge.    Restrictions  Weight Bearing Restrictions Yes  Other Position/Activity Restrictions WBAT  Transfers  Overall transfer level Needs assistance  Equipment used Rolling walker (2 wheeled)  Transfers Sit to/from Stand;Stand Pivot Transfers  Sit to Stand Min guard  Stand pivot  transfers Min guard  OT - End of Session  Equipment Utilized During Treatment Rolling walker  Activity Tolerance Patient tolerated treatment well  Patient left in chair;with call bell/phone within reach;with bed alarm set  Nurse Communication Mobility status  OT Assessment/Plan  OT Plan Discharge plan remains appropriate  OT Frequency (ACUTE ONLY) Min 2X/week  Follow Up Recommendations Home health OT;Supervision/Assistance - 24 hour  OT Equipment None recommended by OT  OT Goal Progression  Progress towards OT goals Progressing toward goals  ADL Goals  Pt Will Perform Grooming with supervision;standing  Pt Will Perform Upper Body Bathing with set-up;with supervision;sitting  Pt Will Perform Lower Body Bathing with min assist;with adaptive equipment;sit to/from stand  Pt Will Perform Upper Body Dressing with set-up;with supervision;sitting  Pt Will Perform Lower Body Dressing with min assist;with adaptive equipment;sit to/from stand  Pt Will Transfer to Toilet with supervision;ambulating;regular height toilet;bedside commode;grab bars  Pt Will Perform Toileting - Clothing Manipulation and hygiene with supervision;sit to/from stand  Pt Will Perform Tub/Shower Transfer Shower transfer;with min guard assist;shower seat;ambulating;3 in 1;grab bars;rolling walker  Additional ADL Goal #1 Pt will be independent wtih posterior THA precautions   OT Time Calculation  OT Start Time (ACUTE ONLY) 1247  OT Stop Time (ACUTE ONLY) 1309  OT Time Calculation (min) 22 min  OT General Charges  $OT Visit 1 Procedure  OT Treatments  $Self Care/Home Management  8-22 mins  [facility], OTR/L [phone]

## 2016-01-24 NOTE — Progress Notes (Signed)
Physical Therapy Treatment Note    01/24/16 1551  PT Visit Information  Last PT Received On 01/24/16  Assistance Needed +1  History of Present Illness Pt is an 82 year old female s/p fall at home resulting in right femoral neck fracture and odontoid fracture.  Pt currently s/p R hip hemiarthroplasty with posterior hip precautions and has soft c-collar for odontoid fracture  Subjective Data  Subjective Pt reported fatigue from today and declined ambulating again so assisted back to bed and performed a couple exercises.  Pt with poor vision so spouse provided with handout on posterior hip precautions.  Reviewed precautions with both pt and spouse.  Precautions  Precautions Cervical;Posterior Hip  Precaution Comments posterior hip precautions and cervical precautions reviewed with pt  Required Braces or Orthoses Cervical Brace  Cervical Brace Soft collar  Restrictions  Other Position/Activity Restrictions WBAT  Pain Assessment  Pain Assessment 0-10  Pain Score 6  Pain Location R hip  Pain Descriptors / Indicators Aching;Sore  Pain Intervention(s) Limited activity within patient's tolerance;Monitored during session;Repositioned;Premedicated before session  Cognition  Arousal/Alertness Awake/alert  Behavior During Therapy WFL for tasks assessed/performed  Overall Cognitive Status Within Functional Limits for tasks assessed  Bed Mobility  Overal bed mobility Needs Assistance  Bed Mobility Sit to Supine  Sit to supine Min assist  General bed mobility comments verbal cues for safe technique within precautions, assist mostly for R LE  Transfers  Overall transfer level Needs assistance  Equipment used Rolling walker (2 wheeled)  Transfers Sit to/from Stand  Sit to Stand Min guard  Stand pivot transfers Min guard  General transfer comment verbal cues for UE and LE positioning, maintaining precautions  Exercises  Exercises Total Joint  Total Joint Exercises  Ankle Circles/Pumps  AROM;Both;10 reps  Heel Slides AAROM;Right;10 reps (exercises within precautions)  Hip ABduction/ADduction AAROM;Right;10 reps  PT - End of Session  Equipment Utilized During Treatment Cervical collar  Activity Tolerance Patient tolerated treatment well  Patient left in bed;with call bell/phone within reach;with bed alarm set;with family/visitor present  PT - Assessment/Plan  PT Plan Current plan remains appropriate  PT Frequency (ACUTE ONLY) Min 5X/week  Follow Up Recommendations Home health PT;Supervision/Assistance - 24 hour  PT equipment None recommended by PT  PT Goal Progression  Progress towards PT goals Progressing toward goals  PT Time Calculation  PT Start Time (ACUTE ONLY) 1446  PT Stop Time (ACUTE ONLY) 1500  PT Time Calculation (min) (ACUTE ONLY) 14 min  PT General Charges  $$ ACUTE PT VISIT 1 Procedure  PT Treatments  $Therapeutic Activity 8-22 mins   Carmelia Bake, PT, DPT 01/24/2016 Pager: [phone]

## 2016-01-24 NOTE — Progress Notes (Signed)
01/24/16 2300 Nursing Patient unable to void independently. Patient I and Out Cath for 800cc at this time. Will continue to monitor patient

## 2016-01-24 NOTE — Progress Notes (Signed)
PROGRESS NOTE    [name]  [id number] DOB: 06-27-1925 DOA: 01/21/2016 PCP: Merrilee Seashore, MD  Brief interval history  Patient is a 82 year old female with history of hypertension, glaucoma, macular degeneration, chronic back pain hypothyroidism who had a mechanical fall leading to a right femoral neck fracture and odontoid fracture and possible C1 ring fracture. Patient was admitted placement c-collar neurosurgery was consulted who recommended outpatient follow-up. Patient was also seen by orthopedics and underwent right hip hemiarthroplasty on 01/23/2016. PT OT following. Home versus SNF when medically stable.   Assessment & Plan:   Principal Problem:   Fracture of femoral neck, right, closed Active Problems:   Hypertension   Hx of thyroid cancer   Hypokalemia   Hypothyroidism   Protein-calorie malnutrition, moderate (HCC)   DDD (degenerative disc disease), lumbosacral   GERD (gastroesophageal reflux disease)   Closed displaced fracture of right femoral neck (HCC)   Fall   Odontoid fracture (HCC)   Closed right hip fracture (HCC)  #1 right femoral neck fracture Questionable etiology. Secondary to mechanical fall. Patient has been seen in consultation by orthopedics and patient status post right hip hemiarthroplasty 01/23/2016. Continue oxycodone for breakthrough pain. Patient with significant pain and as such has been started on long-acting OxyContin 15 mg twice a day. Continue Robaxin when necessary. Per orthopedics.  #2 leukocytosis Likely secondary to stress-induced demargination. No signs or symptoms of infection. WBC trending down. Follow.  #3 hypertension Continue propranolol.  #4 hypothyroidism TSH of 0.140. Decreased dose of Synthroid.  #5 odontoid fracture and possible C1 ring fracture Per admission history and physical EDP spoke with neurosurgery, Dr. Joya Salm who recommended starting patient on c-collar with outpatient follow-up.  #6  gastroesophageal reflux disease  PPI.  #7 moderate protein calorie Malnutrition Dietitian consultation pending.  #8 minimal lower extremity edema versus venous stasis changes Bilateral lower extremities with minimal edema, less erythematous, nontender to palpation. 2-D echo with EF of 60-65% with no wall motion abnormalities. Grade 1 diastolic dysfunction.    DVT prophylaxis: SCDs Code Status: DO NOT RESUSCITATE Family Communication: Updated patient at bedside Disposition Plan: Home with home health services.    Consultants:   Orthopedics: Dr.Aluisio 01/21/2016  Procedures:   Plain films of the right hip and pelvis 01/21/2016  2-D echo 01/22/2016  Chest x-ray 01/21/2016  CT head CT C-spine 01/21/2016  2 D Echo 01/22/2016  Right hip hemiarthroplasty 01/23/2016 per Dr.Aluisio  Antimicrobials:   None   Subjective: Patient states pain is controlled. Patient sitting up in chair. Patient stated she ambulated to the nursing station today. Patient states she is going home when she is discharged and is not going to go to a skilled facility.   Objective: Filed Vitals:   01/23/16 2300 01/24/16 0157 01/24/16 0500 01/24/16 0945  BP:  134/51 129/51 102/44  Pulse:  76 72 66  Temp:  98 F (36.7 C) 97.6 F (36.4 C) 98.8 F (37.1 C)  TempSrc:  Oral Oral Oral  Resp:  14 16 20   Height: 5\' 1"  (1.549 m)     Weight: 69.1 kg (152 lb 5.4 oz)     SpO2:  100% 100% 100%    Intake/Output Summary (Last 24 hours) at 01/24/16 1321 Last data filed at 01/24/16 1140  Gross per 24 hour  Intake   1430 ml  Output   1320 ml  Net    110 ml   Filed Weights   01/23/16 2300  Weight: 69.1 kg (152 lb 5.4 oz)  Examination:  General exam: Appears calm and comfortable. C-collar. Respiratory system: Clear to auscultation. Respiratory effort normal. Cardiovascular system: S1 & S2 heard, RRR. No JVD, murmurs, rubs, gallops or clicks. Trace to 1+ bilateral lower extremity  edema. Gastrointestinal system: Abdomen is nondistended, soft and nontender. No organomegaly or masses felt. Normal bowel sounds heard. Central nervous system: Alert and oriented. No focal neurological deficits. Extremities: Symmetric 5 x 5 power. Skin: Bilateral lower extremity edema with some slight erythema that is blanchable, nontender to palpation, trace edema. Some excoriations. No rashes, lesions or ulcers Psychiatry: Judgement and insight appear normal. Mood & affect appropriate.     Data Reviewed: I have personally reviewed following labs and imaging studies  CBC:  Recent Labs Lab 01/21/16 1829 01/22/16 0436 01/23/16 0418 01/24/16 0416  WBC 13.9* 7.3 6.2 9.5  NEUTROABS 12.3*  --   --   --   HGB 12.1 11.0* 10.5* 9.3*  HCT 37.5 34.0* 33.2* 29.0*  MCV 93.1 93.4 95.1 95.4  PLT 288 259 220 [id number]   Basic Metabolic Panel:  Recent Labs Lab 01/21/16 1829 01/22/16 0436 01/23/16 0418 01/24/16 0416  NA 134* 135 134* 134*  K 3.4* 3.5 4.2 4.4  CL 97* 100* 100* 98*  CO2 26 28 30 29   GLUCOSE 133* 115* 118* 134*  BUN 18 16 16 15   CREATININE 0.91 0.88 1.00 0.79  CALCIUM 8.6* 7.9* 8.0* 8.0*  MG  --  2.2  --   --    GFR: Estimated Creatinine Clearance: 43.2 mL/min (by C-G formula based on Cr of 0.79). Liver Function Tests: No results for input(s): AST, ALT, ALKPHOS, BILITOT, PROT, ALBUMIN in the last 168 hours. No results for input(s): LIPASE, AMYLASE in the last 168 hours. No results for input(s): AMMONIA in the last 168 hours. Coagulation Profile:  Recent Labs Lab 01/21/16 2252  INR 1.04   Cardiac Enzymes: No results for input(s): CKTOTAL, CKMB, CKMBINDEX, TROPONINI in the last 168 hours. BNP (last 3 results) No results for input(s): PROBNP in the last 8760 hours. HbA1C: No results for input(s): HGBA1C in the last 72 hours. CBG: No results for input(s): GLUCAP in the last 168 hours. Lipid Profile: No results for input(s): CHOL, HDL, LDLCALC, TRIG, CHOLHDL,  LDLDIRECT in the last 72 hours. Thyroid Function Tests:  Recent Labs  01/22/16 0436  TSH 0.140*   Anemia Panel:  Recent Labs  01/23/16 0916  VITAMINB12 168*  FOLATE 20.1  FERRITIN 142  TIBC 232*  IRON 18*  RETICCTPCT 1.0   Sepsis Labs: No results for input(s): PROCALCITON, LATICACIDVEN in the last 168 hours.  Recent Results (from the past 240 hour(s))  Surgical PCR screen     Status: None   Collection Time: 01/22/16  5:39 AM  Result Value Ref Range Status   MRSA, PCR NEGATIVE NEGATIVE Final   Staphylococcus aureus NEGATIVE NEGATIVE Final    Comment:        The Xpert SA Assay (FDA approved for NASAL specimens in patients over 45 years of age), is one component of a comprehensive surveillance program.  Test performance has been validated by [facility] for patients greater than or equal to 37 year old. It is not intended to diagnose infection nor to guide or monitor treatment.          Radiology Studies: Pelvis Portable  01/23/2016  CLINICAL DATA:  Status post right hip replacement EXAM: PORTABLE PELVIS 1-2 VIEWS COMPARISON:  None. FINDINGS: Right hip replacement is now seen. No acute  bony abnormality is noted. Surgical drain is noted in place. Pelvic ring is intact. IMPRESSION: Status post right hip replacement.  No acute abnormality noted. These results will be called to the ordering clinician or representative by the Radiologist Assistant, and communication documented in the PACS or zVision Dashboard. Electronically Signed   By: Inez Catalina M.D.   On: 01/23/2016 15:31        Scheduled Meds: . brimonidine  1 drop Both Eyes BID  . dorzolamide-timolol  1 drop Both Eyes BID  . enoxaparin (LOVENOX) injection  40 mg Subcutaneous Q24H  . gabapentin  600 mg Oral TID  . latanoprost  1 drop Both Eyes QHS  . levothyroxine  137 mcg Oral QAC breakfast  . oxyCODONE  15 mg Oral Q12H  . oxymetazoline  1 spray Each Nare BID  . pantoprazole  40 mg Oral Daily  .  propranolol  20 mg Oral Daily  . senna-docusate  1 tablet Oral QHS   Continuous Infusions: . sodium chloride 75 mL/hr at 01/23/16 1720     LOS: 3 days    Time spent: 75 minutes    Sheila Pitones, MD [facility] Pager [phone]  If 7PM-7AM, please contact night-coverage [email or web address] Password [email or web address] 01/24/2016, 1:21 PM

## 2016-01-24 NOTE — Evaluation (Signed)
Physical Therapy Evaluation Patient Details Name: [name] MRN: [id number] DOB: [birth year] Today's Date: 01/24/2016   History of Present Illness  Pt is an 82 year old female s/p fall at home resulting in right femoral neck fracture and odontoid fracture.  Pt currently s/p R hip hemiarthroplasty with posterior hip precautions and has soft c-collar for odontoid fracture  Clinical Impression  Patient is s/p above surgery resulting in functional limitations due to the deficits listed below (see PT Problem List).  Patient will benefit from skilled PT to increase their independence and safety with mobility to allow discharge to the venue listed below.   Pt educated on cervical and posterior hip precautions.  Pt plans to d/c home with assist from spouse.         Follow Up Recommendations Home health PT;Supervision/Assistance - 24 hour (pt declines SNF, states her plan is home with sposue assist)    Equipment Recommendations  None recommended by PT    Recommendations for Other Services       Precautions / Restrictions Precautions Precautions: Cervical;Posterior Hip Precaution Comments: posterior hip precautions and cervical precautions reviewed with pt Required Braces or Orthoses: Cervical Brace Cervical Brace: Soft collar Restrictions Other Position/Activity Restrictions: WBAT      Mobility  Bed Mobility Overal bed mobility: Needs Assistance Bed Mobility: Supine to Sit     Supine to sit: Min assist;+2 for safety/equipment     General bed mobility comments: verbal cues for safe technique within precautions, assist mostly for R LE  Transfers Overall transfer level: Needs assistance Equipment used: Rolling walker (2 wheeled) Transfers: Sit to/from Stand Sit to Stand: Min assist;+2 safety/equipment         General transfer comment: verbal cues for UE and LE positioning, maintaining precautions  Ambulation/Gait Ambulation/Gait assistance: Min guard Ambulation Distance  (Feet): 100 Feet Assistive device: Rolling walker (2 wheeled) Gait Pattern/deviations: Step-to pattern;Antalgic     General Gait Details: verbal cues for sequence, RW positioning, maintaining precautions, pt reports 8/10 hip pain during gait, a couple standing rest breaks required  Stairs            Wheelchair Mobility    Modified Rankin (Stroke Patients Only)       Balance                                             Pertinent Vitals/Pain Pain Assessment: 0-10 Pain Score: 7  Pain Location: R hip Pain Descriptors / Indicators: Sore;Aching Pain Intervention(s): Limited activity within patient's tolerance;Monitored during session;Repositioned;Premedicated before session    Home Living Family/patient expects to be discharged to:: Private residence Living Arrangements: Spouse/significant other   Type of Home: House Home Access: Stairs to enter Entrance Stairs-Rails: Left;Right;Can reach both [employer] brewer of Steps: 3 Home Layout: Able to live on main level with bedroom/bathroom Home Equipment: Walker - 4 wheels;Cane - single point;Bedside commode      Prior Function Level of Independence: Independent with assistive device(s)         Comments: Pt uses SPC for ambulation     Hand Dominance        Extremity/Trunk Assessment               Lower Extremity Assessment: RLE deficits/detail;Generalized weakness RLE Deficits / Details: functional hip weakness observed, maintained precautions       Communication   Communication: No difficulties  Cognition Arousal/Alertness: Awake/alert Behavior During Therapy: WFL for tasks assessed/performed Overall Cognitive Status: Within Functional Limits for tasks assessed                      General Comments      Exercises        Assessment/Plan    PT Assessment Patient needs continued PT services  PT Diagnosis Difficulty walking;Acute pain   PT Problem List  Decreased strength;Decreased activity tolerance;Decreased mobility;Pain;Decreased knowledge of precautions;Decreased knowledge of use of DME;Decreased safety awareness  PT Treatment Interventions DME instruction;Gait training;Stair training;Functional mobility training;Patient/family education;Therapeutic activities;Therapeutic exercise   PT Goals (Current goals can be found in the Care Plan section) Acute Rehab PT Goals PT Goal Formulation: With patient Time For Goal Achievement: 01/31/16 Potential to Achieve Goals: Good    Frequency Min 5X/week   Barriers to discharge        Co-evaluation PT/OT/SLP Co-Evaluation/Treatment: Yes Reason for Co-Treatment: For patient/therapist safety;Complexity of the patient's impairments (multi-system involvement) PT goals addressed during session: Mobility/safety with mobility;Proper use of DME OT goals addressed during session: ADL's and self-care;Proper use of Adaptive equipment and DME       End of Session Equipment Utilized During Treatment: Gait belt;Cervical collar Activity Tolerance: Patient tolerated treatment well Patient left: in chair;with call bell/phone within reach;with chair alarm set           Time: [id number] PT Time Calculation (min) (ACUTE ONLY): 26 min   Charges:   PT Evaluation $PT Eval Moderate Complexity: 1 Procedure     PT G Codes:        Kalyb Pemble,KATHrine E 01/24/2016, 12:23 PM Carmelia Bake, PT, DPT 01/24/2016 Pager: [phone]

## 2016-01-24 NOTE — Op Note (Signed)
NAMEEMERSEN, Sheila NO.:  [id number]  MEDICAL RECORD NO.:  [id number]  LOCATION:  [id number]                         FACILITY:  [facility]  PHYSICIAN:  Gaynelle Arabian, M.D.    DATE OF BIRTH:  Dec 01, 1924  DATE OF PROCEDURE:  01/23/2016 DATE OF DISCHARGE:                              OPERATIVE REPORT   PREOPERATIVE DIAGNOSIS:  Right femoral neck fracture.  POSTOPERATIVE DIAGNOSIS:  Right femoral neck fracture.  PROCEDURE:  Right hip hemiarthroplasty.  SURGEON:  Gaynelle Arabian, M.D.  ASSISTANT:  Judith Part. Chabon, P.A.  ANESTHESIA:  General.  ESTIMATED BLOOD LOSS:  200.  DRAINS:  Hemovac x1.  COMPLICATIONS:  None.  CONDITION:  Stable to recovery.  BRIEF CLINICAL NOTE:  [name] is an 82 year old female, who had a fall at home 2 days ago, sustaining a displaced right femoral neck fracture. She has been cleared medically and presents now for right hip hemiarthroplasty.  PROCEDURE IN DETAIL:  After successful administration of general anesthetic, the patient was placed in the left lateral decubitus position with the right side up and held with the hip positioner.  Right lower extremity was isolated from her perineum with plastic drapes and prepped and draped in the usual sterile fashion.  Short posterolateral incision was made with a 10-blade through the subcutaneous tissue to the level of the fascia lata, which was incised, in line with the skin incision.  Sciatic nerve was palpated and protected, and short rotators and capsule isolated off the femur.  Fracture hematoma was identified in the joint.  I was able to remove the femoral head, which had a diameter of 46 mm.  We freshened the femoral neck cut to get the appropriate configuration.  The femur was then prepared.  Canal finder was first passed and the canal was thoroughly irrigated with saline.  We used the lateralizing reamer to get into the greater trochanter.  I then started broaching at size 1 up to  size 3, which had an excellent purchase, excellent axial stability and rotational stability.  We placed a trial 28+ 1.5 head and a 46 bipolar.  The hip was reduced with excellent stability, full extension, full external rotation, 40 degrees of adduction and 70 degrees of internal rotation, and 90 degrees of flexion and 70 degrees of internal rotation.  By placing the right leg on top of the left, leg lengths were equal.  The hip was then dislocated and the trials were removed.  The cement restrictor was sized and size 3 was most appropriate.  It was placed at the appropriate depth in the femoral canal.  Canal was thoroughly irrigated with pulsatile lavage and then dried out.  Cement was mixed and once ready for implantation, the cement injected into the femoral canal and hand pressurized.  Size 3 Summit basic stem from [employer] was then cemented in its position and about 20 degrees of anteversion.  When the cement had fully hardened, then the permanent 28+ 1.5 head was placed, the permanent 46-mm bipolar component.  Hip was reduced to the same stability parameters.  The wound was copiously irrigated with saline solution and then short rotators and capsule were reattached to the femur through  drill holes with Ethibond suture.  A 30 mL of 0.25% Marcaine was then injected into the fascia lata and subcu tissues.  The fascia lata was then closed over Hemovac drain with a running #1 V-Loc suture.  Subcu was closed with interrupted 2-0 Vicryl and subcuticular running 4-0 Monocryl.  The drains hooked to suction.  Incision was cleaned and dried.  Steri-Strips and a bulky sterile dressing applied.  She was placed into a knee immobilizer, awakened, and transferred to recovery in stable condition.  Note that a surgical assistant is a medical necessity for this procedure to do it in a safe and expeditious manner.  Surgical assistant was necessary for proper positioning of the limb and for retraction of  vital neurovascular structures.  Proper positioning was necessary for accurate and safe placement of the prosthesis.     Gaynelle Arabian, M.D.     FA/MEDQ  D:  01/23/2016  T:  01/24/2016  Job:  [id number]

## 2016-01-24 NOTE — Evaluation (Signed)
Occupational Therapy Evaluation Patient Details Name: [name] MRN: [id number] DOB: [birth year] Today's Date: 01/24/2016    History of Present Illness Pt is an 82 year old female s/p fall at home resulting in right femoral neck fracture and odontoid fracture.  Pt currently s/p R hip hemiarthroplasty with posterior hip precautions and has soft c-collar for odontoid fracture   Clinical Impression   Pt admitted with above. She demonstrates the below listed deficits and will benefit from continued OT to maximize safety and independence with BADLs.  Pt presents to OT with generalized weakness, pain, and decreased knowledge of posterior THA precautions .  She currently requires max A for LB ADLs.  She reports spouse is in good health and able to assist her as needed at discharge.  Pt is adamant about returning home       Follow Up Recommendations  Home health OT;Supervision/Assistance - 24 hour    Equipment Recommendations  None recommended by OT    Recommendations for Other Services       Precautions / Restrictions Precautions Precautions: Cervical;Posterior Hip Precaution Comments: posterior hip precautions and cervical precautions reviewed with pt Required Braces or Orthoses: Cervical Brace Cervical Brace: Soft collar Restrictions Weight Bearing Restrictions: Yes Other Position/Activity Restrictions: WBAT      Mobility Bed Mobility Overal bed mobility: Needs Assistance Bed Mobility: Supine to Sit     Supine to sit: Min assist;+2 for safety/equipment     General bed mobility comments: verbal cues for safe technique within precautions, assist mostly for R LE  Transfers Overall transfer level: Needs assistance Equipment used: Rolling walker (2 wheeled) Transfers: Sit to/from [facility] Sit to Stand: Min assist;+2 safety/equipment Stand pivot transfers: Min assist;+2 safety/equipment       General transfer comment: verbal cues for UE and LE  positioning, maintaining precautions    Balance                                            ADL Overall ADL's : Needs assistance/impaired Eating/Feeding: Independent;Sitting   Grooming: Wash/dry hands;Wash/dry face;Oral care;Brushing hair;Set up;Sitting   Upper Body Bathing: Set up;Sitting   Lower Body Bathing: Maximal assistance;Sit to/from stand   Upper Body Dressing : Minimal assistance;Sitting   Lower Body Dressing: Total assistance;Sit to/from stand   Toilet Transfer: Minimal assistance;Ambulation;Comfort height toilet;Grab bars;RW   Toileting- Clothing Manipulation and Hygiene: Moderate assistance;Sit to/from stand       Functional mobility during ADLs: Minimal assistance;+2 for safety/equipment       Vision Additional Comments: Pt unable to dial phone, unable to read menu    Perception     Praxis      Pertinent Vitals/Pain Pain Assessment: Faces Faces Pain Scale: Hurts little more Pain Location: Rt hip  Pain Descriptors / Indicators: Aching;Grimacing Pain Intervention(s): Monitored during session;Premedicated before session;Limited activity within patient's tolerance     Hand Dominance Right   Extremity/Trunk Assessment Upper Extremity Assessment Upper Extremity Assessment: Generalized weakness   Lower Extremity Assessment Lower Extremity Assessment: Defer to PT evaluation RLE Deficits / Details: functional hip weakness observed, maintained precautions       Communication Communication Communication: No difficulties   Cognition Arousal/Alertness: Awake/alert Behavior During Therapy: WFL for tasks assessed/performed Overall Cognitive Status: Within Functional Limits for tasks assessed  General Comments       Exercises       Shoulder Instructions      Home Living Family/patient expects to be discharged to:: Private residence Living Arrangements: Spouse/significant other Available Help at  Discharge: Family;Available 24 hours/day Type of Home: House Home Access: Stairs to enter [employer] of Steps: 3 Entrance Stairs-Rails: Left;Right;Can reach both Home Layout: Able to live on main level with bedroom/bathroom     Bathroom Shower/Tub: Occupational psychologist: Standard Bathroom Accessibility: Yes How Accessible: Accessible via walker Home Equipment: [place] - 4 wheels;Cane - single point;Bedside commode;Grab bars - toilet;Grab bars - tub/shower;Shower seat   Additional Comments: Pt reports spouse is in good health and able to assist her as needed       Prior Functioning/Environment Level of Independence: Independent with assistive device(s)        Comments: Pt uses SPC for ambulation    OT Diagnosis: Generalized weakness;Acute pain   OT Problem List: Decreased strength;Decreased activity tolerance;Decreased safety awareness;Decreased knowledge of use of DME or AE;Decreased knowledge of precautions;Pain   OT Treatment/Interventions: Self-care/ADL training;DME and/or AE instruction;Therapeutic activities;Patient/family education    OT Goals(Current goals can be found in the care plan section) Acute Rehab OT Goals Patient Stated Goal: to go home  OT Goal Formulation: With patient Time For Goal Achievement: 01/31/16 Potential to Achieve Goals: Good ADL Goals Pt Will Perform Grooming: with supervision;standing Pt Will Perform Upper Body Bathing: with set-up;with supervision;sitting Pt Will Perform Lower Body Bathing: with min assist;with adaptive equipment;sit to/from stand Pt Will Perform Upper Body Dressing: with set-up;with supervision;sitting Pt Will Perform Lower Body Dressing: with min assist;with adaptive equipment;sit to/from stand Pt Will Transfer to Toilet: with supervision;ambulating;regular height toilet;bedside commode;grab bars Pt Will Perform Toileting - Clothing Manipulation and hygiene: with supervision;sit to/from stand Pt  Will Perform Tub/Shower Transfer: Shower transfer;with min guard assist;shower seat;ambulating;3 in 1;grab bars;rolling walker Additional ADL Goal #1: Pt will be independent wtih posterior THA precautions   OT Frequency: Min 2X/week   Barriers to D/C:            Co-evaluation PT/OT/SLP Co-Evaluation/Treatment: Yes Reason for Co-Treatment: For patient/therapist safety PT goals addressed during session: Mobility/safety with mobility;Proper use of DME OT goals addressed during session: ADL's and self-care      End of Session Equipment Utilized During Treatment: Rolling walker Nurse Communication: Mobility status  Activity Tolerance: Patient tolerated treatment well Patient left: in chair;with call bell/phone within reach;with chair alarm set   Time: [id number] OT Time Calculation (min): 23 min Charges:  OT General Charges $OT Visit: 1 Procedure OT Evaluation $OT Eval Moderate Complexity: 1 Procedure G-Codes:    Keanu Lesniak M 01/29/16, 3:01 PM

## 2016-01-24 NOTE — Care Management Important Message (Signed)
Important Message  Patient Details  Name: ERSEL TAHIR MRN: [id number] Date of Birth: Mar 04, 1925   Medicare Important Message Given:  Yes    Camillo Flaming 01/24/2016, 9:31 AMImportant Message  Patient Details  Name: OTTILIA WINAND MRN: [id number] Date of Birth: 1925-07-21   Medicare Important Message Given:  Yes    Camillo Flaming 01/24/2016, 9:31 AM

## 2016-01-24 NOTE — Progress Notes (Signed)
Subjective: 1 Day Post-Op Procedure(s) (LRB): ARTHROPLASTY BIPOLAR HIP (HEMIARTHROPLASTY) (Right) Patient reports pain as mild.   Patient seen in rounds with Dr. Wynelle Link. Sitting up in bed eating breakfast. Patient is well, and has had no acute complaints or problems We will start therapy today.  Plan is to go Home after hospital stay.  Patient wants to try and go home following her hospital stay.  Objective: Vital signs in last 24 hours: Temp:  [97.6 F (36.4 C)-98.6 F (37 C)] 97.6 F (36.4 C) (05/23 0500) Pulse Rate:  [63-91] 72 (05/23 0500) Resp:  [11-16] 16 (05/23 0500) BP: (120-185)/(48-78) 129/51 mmHg (05/23 0500) SpO2:  [99 %-100 %] 100 % (05/23 0500) Weight:  [69.1 kg (152 lb 5.4 oz)] 69.1 kg (152 lb 5.4 oz) (05/22 2300)  Intake/Output from previous day:  Intake/Output Summary (Last 24 hours) at 01/24/16 0820 Last data filed at 01/24/16 0500  Gross per 24 hour  Intake   1320 ml  Output   2020 ml  Net   -700 ml     Labs:  Recent Labs  01/21/16 1829 01/22/16 0436 01/23/16 0418 01/24/16 0416  HGB 12.1 11.0* 10.5* 9.3*    Recent Labs  01/23/16 0418 01/23/16 0916 01/24/16 0416  WBC 6.2  --  9.5  RBC 3.49* 3.56* 3.04*  HCT 33.2*  --  29.0*  PLT 220  --  212    Recent Labs  01/23/16 0418 01/24/16 0416  NA 134* 134*  K 4.2 4.4  CL 100* 98*  CO2 30 29  BUN 16 15  CREATININE 1.00 0.79  GLUCOSE 118* 134*  CALCIUM 8.0* 8.0*    Recent Labs  01/21/16 2252  INR 1.04    EXAM General - Patient is Alert and Appropriate Extremity - Neurovascular intact Sensation intact distally Dorsiflexion/Plantar flexion intact Dressing - dressing C/D/I Motor Function - intact, moving foot and toes well on exam.  Hemovac pulled without difficulty. Will DC the knee immobilizer  Past Medical History  Diagnosis Date  . Angina   . Headache(784.0)   . Hypoglycemia   . Fracture 2008    back  . Macular degeneration of both eyes   . Glaucoma   .  Hypothyroidism   . Blood transfusion   . Anemia   . GERD (gastroesophageal reflux disease)   . H/O hiatal hernia   . Arthritis   . Chronic back pain   . Thyroid cancer ([place]) 1986  . Shortness of breath 11/08/11    "here lately, all the time"  . Exertional dyspnea 01/17/12  . Osteomyelitis ([place]) 11/2011    right knee  . Hypertension   . Pneumonia 01/2010  . Metabolic acidemia 123456    Assessment/Plan: 1 Day Post-Op Procedure(s) (LRB): ARTHROPLASTY BIPOLAR HIP (HEMIARTHROPLASTY) (Right) Principal Problem:   Fracture of femoral neck, right, closed Active Problems:   Hypertension   Hx of thyroid cancer   Hypokalemia   Hypothyroidism   Protein-calorie malnutrition, moderate (HCC)   DDD (degenerative disc disease), lumbosacral   GERD (gastroesophageal reflux disease)   Closed displaced fracture of right femoral neck (HCC)   Fall   Odontoid fracture (HCC)   Closed right hip fracture (HCC)  Estimated body mass index is 28.8 kg/(m^2) as calculated from the following:   Height as of this encounter: 5\' 1"  (1.549 m).   Weight as of this encounter: 69.1 kg (152 lb 5.4 oz). Up with therapy  DVT Prophylaxis - Lovenox Weight Bearing As Tolerated right Leg D/C  Knee Immobilizer Hemovac Pulled Begin Therapy Hip Preacutions  Arlee Muslim, PA-C Orthopaedic Surgery 01/24/2016, 8:20 AM

## 2016-01-24 NOTE — Progress Notes (Signed)
[facility] NOTE  Pharmacy Consult for IV Iron Indication: IDA  Allergies  Allergen Reactions  . Demerol Shortness Of Breath  . Penicillins Swelling    "in the face" Has patient had a PCN reaction causing immediate rash, facial/tongue/throat swelling, SOB or lightheadedness with hypotension: yes Has patient had a PCN reaction causing severe rash involving mucus membranes or skin necrosis: no Has patient had a PCN reaction that required hospitalization: no  Has patient had a PCN reaction occurring within the last 10 years: no If all of the above answers are "NO", then may proceed with Cephalosporin use.   . Corticosteroids Other (See Comments)    DIZZINESS, HEADACHE  . Lorazepam Anxiety  . Tetracyclines & Related Rash  . Vancomycin Rash    Patient Measurements: Height: 5\' 1"  (154.9 cm) Weight: 152 lb 5.4 oz (69.1 kg) IBW/kg (Calculated) : 47.8  Vital Signs: Temp: 97.6 F (36.4 C) (05/23 0500) Temp Source: Oral (05/23 0500) BP: 129/51 mmHg (05/23 0500) Pulse Rate: 72 (05/23 0500) Intake/Output from previous day: 05/22 0701 - 05/23 0700 In: 1320 [P.O.:120; I.V.:1200] Out: 2020 [Urine:1700; Drains:120; Blood:200] Intake/Output from this shift: Total I/O In: 50 [P.O.:50] Out: -   Labs:  Recent Labs  01/21/16 2252 01/22/16 0436 01/23/16 0418 01/24/16 0416  WBC  --  7.3 6.2 9.5  HGB  --  11.0* 10.5* 9.3*  HCT  --  34.0* 33.2* 29.0*  PLT  --  259 220 212  APTT 36  --   --   --   CREATININE  --  0.88 1.00 0.79  MG  --  2.2  --   --    Estimated Creatinine Clearance: 43.2 mL/min (by C-G formula based on Cr of 0.79).   Assessment: 9 yoF admitted with hip fracture now s/p arthroplasty bipolar hip 5/22.  Pharmacy consulted to dose IV iron.  Patient anemic with some expected ABLA following surgery and iron studies returned low, appears to be functional iron deficiency with normal ferritin and low tsat.  CBC: H/H 9.3, 29.0  Iron panel: iron 18, TIBC  232, tsat 8, ferritin 142  Plan:  Feraheme 510 mg IV x 1.  Sindy Mccune 01/24/2016,9:33 AM

## 2016-01-24 NOTE — Progress Notes (Signed)
CSW consulted for SNF placement. PN reviewed. PT recommends HHPT vs SNF depending on progress. CSW met with pt to review recommendations. Pt declining SNF at this time. RNCM will assist with d/c planning needs.  Werner Lean LCSW [phone]

## 2016-01-25 DIAGNOSIS — K219 Gastro-esophageal reflux disease without esophagitis: Secondary | ICD-10-CM

## 2016-01-25 DIAGNOSIS — E039 Hypothyroidism, unspecified: Secondary | ICD-10-CM

## 2016-01-25 DIAGNOSIS — I10 Essential (primary) hypertension: Secondary | ICD-10-CM

## 2016-01-25 DIAGNOSIS — S72001D Fracture of unspecified part of neck of right femur, subsequent encounter for closed fracture with routine healing: Secondary | ICD-10-CM

## 2016-01-25 DIAGNOSIS — S72001P Fracture of unspecified part of neck of right femur, subsequent encounter for closed fracture with malunion: Secondary | ICD-10-CM

## 2016-01-25 LAB — CBC
HCT: 27.1 % — ABNORMAL LOW (ref 36.0–46.0)
Hemoglobin: 8.6 g/dL — ABNORMAL LOW (ref 12.0–15.0)
MCH: 30.3 pg (ref 26.0–34.0)
MCHC: 31.7 g/dL (ref 30.0–36.0)
MCV: 95.4 fL (ref 78.0–100.0)
PLATELETS: 207 10*3/uL (ref 150–400)
RBC: 2.84 MIL/uL — AB (ref 3.87–5.11)
RDW: 13.8 % (ref 11.5–15.5)
WBC: 5.1 10*3/uL (ref 4.0–10.5)

## 2016-01-25 LAB — BASIC METABOLIC PANEL
Anion gap: 6 (ref 5–15)
BUN: 15 mg/dL (ref 6–20)
CO2: 30 mmol/L (ref 22–32)
CREATININE: 0.86 mg/dL (ref 0.44–1.00)
Calcium: 7.8 mg/dL — ABNORMAL LOW (ref 8.9–10.3)
Chloride: 97 mmol/L — ABNORMAL LOW (ref 101–111)
GFR calc Af Amer: >60 mL/min (ref >60)
GFR, EST NON AFRICAN AMERICAN: 59 mL/min — AB (ref >60)
GLUCOSE: 119 mg/dL — AB (ref 65–99)
POTASSIUM: 4.2 mmol/L (ref 3.5–5.1)
SODIUM: 133 mmol/L — AB (ref 135–145)

## 2016-01-25 MED ORDER — TAMSULOSIN HCL 0.4 MG PO CAPS
0.4000 mg | ORAL_CAPSULE | Freq: Every day | ORAL | Status: DC
  Administered 2016-01-25 – 2016-01-26 (×2): 0.4 mg via ORAL
  Filled 2016-01-25 (×2): qty 1

## 2016-01-25 NOTE — Progress Notes (Signed)
01/25/16 Nursing 0545 Patient still unable to void. I/Out cath #2 for 500cc urine.

## 2016-01-25 NOTE — Progress Notes (Signed)
   Subjective: 2 Days Post-Op Procedure(s) (LRB): ARTHROPLASTY BIPOLAR HIP (HEMIARTHROPLASTY) (Right) Patient reports pain as mild and moderate.   Patient seen in rounds for Dr. Wynelle Link this morning. Patient is having problems with pain in the hip area, requiring pain medications Plan is to go Home after hospital stay.  Objective: Vital signs in last 24 hours: Temp:  [98.1 F (36.7 C)-98.2 F (36.8 C)] 98.1 F (36.7 C) (05/24 1418) Pulse Rate:  [69-79] 69 (05/24 1418) Resp:  [16-17] 17 (05/24 1418) BP: (125-161)/(44-78) 134/44 mmHg (05/24 1418) SpO2:  [90 %-94 %] 90 % (05/24 1418)  Intake/Output from previous day:  Intake/Output Summary (Last 24 hours) at 01/25/16 1709 Last data filed at 01/25/16 1419  Gross per 24 hour  Intake    743 ml  Output   1300 ml  Net   -557 ml    Intake/Output this shift: Total I/O In: 240 [P.O.:240] Out: -   Labs:  Recent Labs  01/23/16 0418 01/24/16 0416 01/25/16 0415  HGB 10.5* 9.3* 8.6*    Recent Labs  01/24/16 0416 01/25/16 0415  WBC 9.5 5.1  RBC 3.04* 2.84*  HCT 29.0* 27.1*  PLT 212 207    Recent Labs  01/24/16 0416 01/25/16 0415  NA 134* 133*  K 4.4 4.2  CL 98* 97*  CO2 29 30  BUN 15 15  CREATININE 0.79 0.86  GLUCOSE 134* 119*  CALCIUM 8.0* 7.8*   No results for input(s): LABPT, INR in the last 72 hours.  EXAM General - Patient is Alert and Appropriate Extremity - Neurovascular intact Sensation intact distally Dorsiflexion/Plantar flexion intact Dressing/Incision - clean, dry, no drainage Motor Function - intact, moving foot and toes well on exam.   Past Medical History  Diagnosis Date  . Angina   . Headache(784.0)   . Hypoglycemia   . Fracture 2008    back  . Macular degeneration of both eyes   . Glaucoma   . Hypothyroidism   . Blood transfusion   . Anemia   . GERD (gastroesophageal reflux disease)   . H/O hiatal hernia   . Arthritis   . Chronic back pain   . Thyroid cancer ([place]) 1986  .  Shortness of breath 11/08/11    "here lately, all the time"  . Exertional dyspnea 01/17/12  . Osteomyelitis ([place]) 11/2011    right knee  . Hypertension   . Pneumonia 01/2010  . Metabolic acidemia 123456    Assessment/Plan: 2 Days Post-Op Procedure(s) (LRB): ARTHROPLASTY BIPOLAR HIP (HEMIARTHROPLASTY) (Right) Principal Problem:   Fracture of femoral neck, right, closed Active Problems:   Hypertension   Hx of thyroid cancer   Hypokalemia   Hypothyroidism   Protein-calorie malnutrition, moderate (HCC)   DDD (degenerative disc disease), lumbosacral   GERD (gastroesophageal reflux disease)   Closed displaced fracture of right femoral neck (HCC)   Fall   Odontoid fracture (HCC)   Closed right hip fracture (HCC)  Estimated body mass index is 28.8 kg/(m^2) as calculated from the following:   Height as of this encounter: 5\' 1"  (1.549 m).   Weight as of this encounter: 69.1 kg (152 lb 5.4 oz). Up with therapy Plan for discharge tomorrow Discharge home with home health  DVT Prophylaxis - Lovenox Weight Bearing As Tolerated right Leg Recheck labs in the AM  Arlee Muslim, PA-C Orthopaedic Surgery 01/25/2016, 5:09 PM

## 2016-01-25 NOTE — Progress Notes (Signed)
Occupational Therapy Treatment Patient Details Name: [name] MRN: [id number] DOB: 1925-03-26 Today's Date: 01/25/2016    History of present illness Pt is an 82 year old female s/p fall at home resulting in right femoral neck fracture and odontoid fracture.  Pt currently s/p R hip hemiarthroplasty with posterior hip precautions and has soft c-collar for odontoid fracture   OT comments  Pt stood for oral care this am and reviewed all precautions.    Follow Up Recommendations  Home health OT;Supervision/Assistance - 24 hour    Equipment Recommendations  None recommended by OT    Recommendations for Other Services      Precautions / Restrictions Precautions Precautions: Cervical;Posterior Hip Precaution Comments: posterior hip precautions and cervical precautions reviewed with pt Required Braces or Orthoses: Cervical Brace Cervical Brace: Soft collar Restrictions Other Position/Activity Restrictions: WBAT       Mobility Bed Mobility               General bed mobility comments: oob  Transfers   Equipment used: Rolling walker (2 wheeled) Transfers: Sit to/from [employer] Sit to Stand: Min guard Stand pivot transfers: Min guard       General transfer comment: verbal cues for UE and LE positioning, maintaining precautions    Balance                                   ADL       Grooming: Min guard;Oral care;Standing--pt was able to apply toothpaste but difficulty due to vision. She tries to be as independent as she can be                   Armed forces technical officer: Min guard;Stand-pivot;RW (chair)             General ADL Comments: reviewed posterior THPs and cervical precautions.  Pt plans to have her husband assist with ADLs.  Pt did not need to use bathroom this session.        Vision                 Additional Comments: pt has glaucoma and macular degeneration   Perception     Praxis      Cognition    Behavior During Therapy: WFL for tasks assessed/performed Overall Cognitive Status: Within Functional Limits for tasks assessed                       Extremity/Trunk Assessment               Exercises     Shoulder Instructions       General Comments      Pertinent Vitals/ Pain       Pain Assessment: Faces Pain Score: 6  Pain Location: Rhip Pain Descriptors / Indicators: Aching Pain Intervention(s): Limited activity within patient's tolerance;Monitored during session;Premedicated before session;Repositioned;Ice applied  Home Living                                          Prior Functioning/Environment              Frequency Min 2X/week     Progress Toward Goals  OT Goals(current goals can now be found in the care plan section)  Progress towards OT goals: Progressing toward goals  Plan      Co-evaluation                 End of Session Equipment Utilized During Treatment: Rolling walker   Activity Tolerance Patient tolerated treatment well   Patient Left in chair;with call bell/phone within reach;with chair alarm set   Nurse Communication          Time: [id number] OT Time Calculation (min): 15 min  Charges: OT General Charges $OT Visit: 1 Procedure OT Treatments $Self Care/Home Management : 8-22 mins  Brightyn Mozer 01/25/2016, 10:33 AM  Lesle Chris, OTR/L [phone] 01/25/2016

## 2016-01-25 NOTE — Progress Notes (Signed)
Physical Therapy Treatment Note    01/25/16 1400  PT Visit Information  Last PT Received On 01/25/16  Assistance Needed +1  History of Present Illness Pt is an 82 year old female s/p fall at home resulting in right femoral neck fracture and odontoid fracture.  Pt currently s/p R hip hemiarthroplasty with posterior hip precautions and has soft c-collar for odontoid fracture  Subjective Data  Subjective Pt sitting on BSC upon entering room and reporting her R LE uncomfortable.  Pt declined ambulating again and assisted back to bed.  Spouse into room end of session and agreeable for being present for pt practicing steps tomorrow.  Precautions  Precautions Cervical;Posterior Hip  Precaution Comments posterior hip precautions and cervical precautions reviewed with pt  Required Braces or Orthoses Cervical Brace  Cervical Brace Soft collar  Restrictions  Other Position/Activity Restrictions WBAT  Pain Assessment  Pain Assessment 0-10  Pain Score 8  Pain Location R hip, thigh  Pain Descriptors / Indicators Aching;Discomfort;Sore  Pain Intervention(s) Limited activity within patient's tolerance;Monitored during session;Repositioned  Cognition  Arousal/Alertness Awake/alert  Behavior During Therapy WFL for tasks assessed/performed  Overall Cognitive Status Within Functional Limits for tasks assessed  Bed Mobility  Overal bed mobility Needs Assistance  Bed Mobility Sit to Supine  Sit to supine Min guard  General bed mobility comments pt able to self assist R LE onto bed  Transfers  Overall transfer level Needs assistance  Equipment used Rolling walker (2 wheeled)  Transfers Sit to/from [employer] Transfers  Sit to Stand Min guard  Stand pivot transfers Min guard  General transfer comment verbal cues for UE positioning, able to take a couple steps from [facility] to bed  PT - End of Session  Equipment Utilized During Treatment Cervical collar  Activity Tolerance Patient limited by  fatigue;Patient limited by pain  Patient left in bed;with call bell/phone within reach;with family/visitor present  PT - Assessment/Plan  PT Plan Current plan remains appropriate  PT Frequency (ACUTE ONLY) Min 5X/week  Follow Up Recommendations Home health PT;Supervision/Assistance - 24 hour  PT equipment None recommended by PT  PT Goal Progression  Progress towards PT goals Progressing toward goals  PT Time Calculation  PT Start Time (ACUTE ONLY) 1350  PT Stop Time (ACUTE ONLY) 1403  PT Time Calculation (min) (ACUTE ONLY) 13 min  PT General Charges  $$ ACUTE PT VISIT 1 Procedure  PT Treatments  $Therapeutic Activity 8-22 mins   Carmelia Bake, PT, DPT 01/25/2016 Pager: [phone]

## 2016-01-25 NOTE — Progress Notes (Signed)
Patient Demographics:    [name], is a 82 y.o. female, DOB - 02-19-1925, [id number]  Admit date - 01/21/2016   Admitting Physician Ivor Costa, MD  Outpatient Primary MD for the patient is [facility], MD  LOS - 4   Chief Complaint  Patient presents with  . Fall    right hip        Subjective:    Novel Schuchmann today has, No headache, No chest pain, No abdominal pain - No Nausea, No new weakness tingling or numbness, No Cough - SOB. Rt Hip Pain is better, seen with Case manager at bedside   Assessment  & Plan :    Principal Problem:   Fracture of femoral neck, right, closed Active Problems:   Hypertension   Hx of thyroid cancer   Hypokalemia   Hypothyroidism   Protein-calorie malnutrition, moderate (HCC)   DDD (degenerative disc disease), lumbosacral   GERD (gastroesophageal reflux disease)   Closed displaced fracture of right femoral neck (HCC)   Fall   Odontoid fracture ([place])   Closed right hip fracture ([place])  Brief interval history  Patient is a 82 year old female with history of hypertension, glaucoma, macular degeneration, chronic back pain hypothyroidism who had a mechanical fall leading to a right femoral neck fracture and odontoid fracture and possible C1 ring fracture. Patient was admitted placement c-collar neurosurgery was consulted who recommended outpatient follow-up. Patient was also seen by orthopedics and underwent right hip hemiarthroplasty on 01/23/2016. PT OT following. HomeWith home health on 01/26/16  #1 Right Femoral Neck Fracture-  Secondary to mechanical fall. status post right hip hemiarthroplasty 01/23/2016. Continue oxycodone for breakthrough pain. C/n OxyContin 15 mg twice a day. Continue Robaxin when necessary.   #2 Leukocytosis- resolved, no fever, no evidence of active infection Likely secondary to stress-induced demargination. No signs or symptoms  of infection.   #3 Hypertension- c/n  propranolol.  #4 Hypothyroidism- TSH of 0.140. Decreased dose of Synthroid, recheck TSH in 6-8 weeks as outpatient.  #5 Odontoid Fracture and Possible C1 Ring Fracture Per admission history and physical EDP spoke with neurosurgery, Dr. Joya Salm who recommended starting patient on c-collar with outpatient follow-up.  #6 Gastroesophageal Reflux Disease - c/n PPI.  #7 Moderate protein calorie Malnutrition- encourage increased intake  #8 Chronic mild lower extremity edema - may use diuretics when necessary,  2-D echo with EF of 60-65% with no wall motion abnormalities. Grade 1 diastolic dysfunction.   Code Status : DNR  Disposition Plan  : Home with Home health PT on 01/26/16, case manager at bedside at the time of my evaluation on 01/25/2016  Consults  :  Ortho and Neurosurgery  Procedures  : ORIF of Rt Hip Fx on 01/23/16  DVT Prophylaxis  :  Lovenox ,  SCDs   Lab Results  Component Value Date   PLT 207 01/25/2016    Inpatient Medications  Scheduled Meds: . brimonidine  1 drop Both Eyes BID  . dorzolamide-timolol  1 drop Both Eyes BID  . enoxaparin (LOVENOX) injection  40 mg Subcutaneous Q24H  . gabapentin  600 mg Oral TID  . latanoprost  1 drop Both Eyes QHS  . levothyroxine  137 mcg Oral QAC breakfast  . oxyCODONE  15 mg Oral Q12H  .  oxymetazoline  1 spray Each Nare BID  . pantoprazole  40 mg Oral Daily  . propranolol  20 mg Oral Daily  . senna-docusate  1 tablet Oral QHS   Continuous Infusions: . sodium chloride 20 mL/hr at 01/24/16 1414   PRN Meds:.acetaminophen **OR** acetaminophen, acetaminophen, bisacodyl, docusate sodium, hydrALAZINE, HYDROcodone-acetaminophen, ipratropium-albuterol, menthol-cetylpyridinium **OR** phenol, methocarbamol **OR** methocarbamol (ROBAXIN)  IV, metoCLOPramide **OR** metoCLOPramide (REGLAN) injection, morphine injection, ondansetron **OR** ondansetron (ZOFRAN) IV, polyethylene glycol, sodium phosphate,  traMADol   Anti-infectives    Start     Dose/Rate Route Frequency Ordered Stop   01/23/16 2000  clindamycin (CLEOCIN) IVPB 600 mg     600 mg 100 mL/hr over 30 Minutes Intravenous  Once 01/23/16 1655 01/23/16 1958   01/23/16 0600  clindamycin (CLEOCIN) IVPB 900 mg     900 mg 100 mL/hr over 30 Minutes Intravenous On call to O.R. 01/21/16 2232 01/23/16 1336        Objective:   Filed Vitals:   01/24/16 1310 01/24/16 1808 01/24/16 2200 01/25/16 0500  BP: 119/44 134/78 161/58 125/46  Pulse: 64 74 79 74  Temp: 98.2 F (36.8 C) 98.1 F (36.7 C) 98.1 F (36.7 C) 98.2 F (36.8 C)  TempSrc: Oral Oral Oral Oral  Resp: 16 17 16 16   Height:      Weight:      SpO2: 100% 93% 93% 94%    Wt Readings from Last 3 Encounters:  01/23/16 69.1 kg (152 lb 5.4 oz)  02/18/14 52.799 kg (116 lb 6.4 oz)  04/28/12 65.988 kg (145 lb 7.6 oz)     Intake/Output Summary (Last 24 hours) at 01/25/16 0740 Last data filed at 01/25/16 0600  Gross per 24 hour  Intake 2297.5 ml  Output   1500 ml  Net  797.5 ml     Physical Exam  Gen:- Awake Alert, Oriented X 3, No new F.N deficits, Normal affect HEENT:- [place].AT,PERRAL  Neck-Supple Neck,No JVD, No cervical lymphadenopathy appriciated.  Lungs- Symmetrical Chest wall movement, Good air movement bilaterally, CTAB  CV- S1, S2 normal Abd-  +ve B.Sounds, Abd Soft, No tenderness,    Extremity/Skin:- No Cyanosis, Clubbing or edema,   Rt Hip postop site is clean dry and intact, pedal pulses are good    Data Review:   Micro Results Recent Results (from the past 240 hour(s))  Surgical PCR screen     Status: None   Collection Time: 01/22/16  5:39 AM  Result Value Ref Range Status   MRSA, PCR NEGATIVE NEGATIVE Final   Staphylococcus aureus NEGATIVE NEGATIVE Final    Comment:        The Xpert SA Assay (FDA approved for NASAL specimens in patients over 54 years of age), is one component of a comprehensive surveillance program.  Test performance  has been validated by [facility] for patients greater than or equal to 62 year old. It is not intended to diagnose infection nor to guide or monitor treatment.     Radiology Reports Dg Chest 1 View  01/21/2016  CLINICAL DATA:  Fall, right hip pain. EXAM: CHEST 1 VIEW COMPARISON:  Chest x-rays dated 02/18/2014 and 01/2015 2013. FINDINGS: Cardiomediastinal silhouette is stable in size and configuration. Lungs appear hyperexpanded. Lungs are clear. No pleural effusion or pneumothorax seen. Osseous structures about the chest are unremarkable. IMPRESSION: 1. Lungs appear hyperexpanded suggesting COPD/emphysema. 2. Lungs are clear and there is no evidence of acute cardiopulmonary abnormality. Electronically Signed   By: Roxy Horseman.D.  On: 01/21/2016 18:49   Ct Head Wo Contrast  01/21/2016  CLINICAL DATA:  Fall, right hip pain. EXAM: CT HEAD WITHOUT CONTRAST CT CERVICAL SPINE WITHOUT CONTRAST TECHNIQUE: Multidetector CT imaging of the head and cervical spine was performed following the standard protocol without intravenous contrast. Multiplanar CT image reconstructions of the cervical spine were also generated. COMPARISON:  None. FINDINGS: CT HEAD FINDINGS There is mild generalized brain atrophy with commensurate dilatation of the ventricles and sulci. Mild chronic small vessel ischemic change noted within the deep periventricular white matter regions bilaterally. There is no mass, hemorrhage, edema or other evidence of acute parenchymal abnormality. No extra-axial hemorrhage. No skull fracture. Visualized upper paranasal sinuses are clear. CT CERVICAL SPINE FINDINGS Extensive degenerative change throughout the cervical spine. There is a slightly displaced fracture within the odontoid process, with slight posterior tilt of the odontoid, with surrounding erosive change that may indicate chronic fracture displacement. There is also a nondisplaced fracture within the left lamina of C1 which is of  uncertain age. Fracture diastasis, with small adjacent bony fragments, also noted within the right lamina of C1 which is also of uncertain age but more likely chronic, possibly congenital. No other fracture line or displaced fracture fragment is identified within the cervical spine. Facet joints of the C2 through T1 are normally aligned. Atherosclerotic calcifications are noted at each carotid bulb region. Paravertebral soft tissues are otherwise unremarkable. IMPRESSION: 1. Slightly displaced fracture within the C2 odontoid process, with surrounding erosive change that may indicate chronic fracture displacement. There is a slight posterior tilt of the odontoid and up to 3 mm cortical displacement anteriorly. No associated central canal narrowing. 2. Additional nondisplaced fracture within the left lamina of the C1 vertebral body, of uncertain age. Fracture diastasis within the right lamina of C1 is also of uncertain age but has a more chronic appearance, and may be congenital. 3. No acute intracranial findings. No intracranial mass, hemorrhage or edema. These results were called by telephone at the time of interpretation on 01/21/2016 at 7:15 pm to Dr. Brantley Stage , who verbally acknowledged these results. Electronically Signed   By: Franki Cabot M.D.   On: 01/21/2016 19:17   Ct Cervical Spine Wo Contrast  01/21/2016  CLINICAL DATA:  Fall, right hip pain. EXAM: CT HEAD WITHOUT CONTRAST CT CERVICAL SPINE WITHOUT CONTRAST TECHNIQUE: Multidetector CT imaging of the head and cervical spine was performed following the standard protocol without intravenous contrast. Multiplanar CT image reconstructions of the cervical spine were also generated. COMPARISON:  None. FINDINGS: CT HEAD FINDINGS There is mild generalized brain atrophy with commensurate dilatation of the ventricles and sulci. Mild chronic small vessel ischemic change noted within the deep periventricular white matter regions bilaterally. There is no mass,  hemorrhage, edema or other evidence of acute parenchymal abnormality. No extra-axial hemorrhage. No skull fracture. Visualized upper paranasal sinuses are clear. CT CERVICAL SPINE FINDINGS Extensive degenerative change throughout the cervical spine. There is a slightly displaced fracture within the odontoid process, with slight posterior tilt of the odontoid, with surrounding erosive change that may indicate chronic fracture displacement. There is also a nondisplaced fracture within the left lamina of C1 which is of uncertain age. Fracture diastasis, with small adjacent bony fragments, also noted within the right lamina of C1 which is also of uncertain age but more likely chronic, possibly congenital. No other fracture line or displaced fracture fragment is identified within the cervical spine. Facet joints of the C2 through T1 are normally aligned. Atherosclerotic  calcifications are noted at each carotid bulb region. Paravertebral soft tissues are otherwise unremarkable. IMPRESSION: 1. Slightly displaced fracture within the C2 odontoid process, with surrounding erosive change that may indicate chronic fracture displacement. There is a slight posterior tilt of the odontoid and up to 3 mm cortical displacement anteriorly. No associated central canal narrowing. 2. Additional nondisplaced fracture within the left lamina of the C1 vertebral body, of uncertain age. Fracture diastasis within the right lamina of C1 is also of uncertain age but has a more chronic appearance, and may be congenital. 3. No acute intracranial findings. No intracranial mass, hemorrhage or edema. These results were called by telephone at the time of interpretation on 01/21/2016 at 7:15 pm to Dr. Brantley Stage , who verbally acknowledged these results. Electronically Signed   By: Franki Cabot M.D.   On: 01/21/2016 19:17   Pelvis Portable  01/23/2016  CLINICAL DATA:  Status post right hip replacement EXAM: PORTABLE PELVIS 1-2 VIEWS COMPARISON:  None.  FINDINGS: Right hip replacement is now seen. No acute bony abnormality is noted. Surgical drain is noted in place. Pelvic ring is intact. IMPRESSION: Status post right hip replacement.  No acute abnormality noted. These results will be called to the ordering clinician or representative by the Radiologist Assistant, and communication documented in the PACS or zVision Dashboard. Electronically Signed   By: Inez Catalina M.D.   On: 01/23/2016 15:31   Dg Hip Unilat  With Pelvis 2-3 Views Right  01/21/2016  CLINICAL DATA:  Fall, right hip pain. EXAM: DG HIP (WITH OR WITHOUT PELVIS) 2-3V RIGHT COMPARISON:  None. FINDINGS: Markedly displaced fracture within the subcapital region of the right femoral neck. Right femoral head remains well positioned relative to the acetabulum. IMPRESSION: Significantly displaced fracture of the right femoral neck, subcapital region. Electronically Signed   By: Franki Cabot M.D.   On: 01/21/2016 18:48     CBC  Recent Labs Lab 01/21/16 1829 01/22/16 0436 01/23/16 0418 01/24/16 0416 01/25/16 0415  WBC 13.9* 7.3 6.2 9.5 5.1  HGB 12.1 11.0* 10.5* 9.3* 8.6*  HCT 37.5 34.0* 33.2* 29.0* 27.1*  PLT 288 259 220 212 207  MCV 93.1 93.4 95.1 95.4 95.4  MCH 30.0 30.2 30.1 30.6 30.3  MCHC 32.3 32.4 31.6 32.1 31.7  RDW 13.8 14.0 14.2 13.8 13.8  LYMPHSABS 0.6*  --   --   --   --   MONOABS 1.0  --   --   --   --   EOSABS 0.0  --   --   --   --   BASOSABS 0.0  --   --   --   --     Chemistries   Recent Labs Lab 01/21/16 1829 01/22/16 0436 01/23/16 0418 01/24/16 0416 01/25/16 0415  NA 134* 135 134* 134* 133*  K 3.4* 3.5 4.2 4.4 4.2  CL 97* 100* 100* 98* 97*  CO2 26 28 30 29 30   GLUCOSE 133* 115* 118* 134* 119*  BUN 18 16 16 15 15   CREATININE 0.91 0.88 1.00 0.79 0.86  CALCIUM 8.6* 7.9* 8.0* 8.0* 7.8*  MG  --  2.2  --   --   --    ------------------------------------------------------------------------------------------------------------------ No results for  input(s): CHOL, HDL, LDLCALC, TRIG, CHOLHDL, LDLDIRECT in the last 72 hours.  No results found for: HGBA1C ------------------------------------------------------------------------------------------------------------------ No results for input(s): TSH, T4TOTAL, T3FREE, THYROIDAB in the last 72 hours.  Invalid input(s): FREET3 ------------------------------------------------------------------------------------------------------------------  Recent Labs  01/23/16 0916  [id number]  168*  FOLATE 20.1  FERRITIN 142  TIBC 232*  IRON 18*  RETICCTPCT 1.0    Coagulation profile  Recent Labs Lab 01/21/16 2252  INR 1.04    No results for input(s): DDIMER in the last 72 hours.  Cardiac Enzymes No results for input(s): CKMB, TROPONINI, MYOGLOBIN in the last 168 hours.  Invalid input(s): CK ------------------------------------------------------------------------------------------------------------------ No results found for: BNP   Sheridyn Canino M.D on 01/25/2016 at 7:40 AM  Between 7am to 7pm - Pager - [phone]  After 7pm go to [email or web address] - password [email or web address]  [facility] -  Office  [phone]  Dragon dictation system was used to create this note, attempts have been made to correct errors, however presence of uncorrected errors is not a reflection quality of care provided

## 2016-01-25 NOTE — Progress Notes (Signed)
Physical Therapy Treatment Patient Details Name: SAHARRAH MIZZI MRN: [id number] DOB: 1925/08/24 Today's Date: 01/25/2016    History of Present Illness Pt is an 82 year old female s/p fall at home resulting in right femoral neck fracture and odontoid fracture.  Pt currently s/p R hip hemiarthroplasty with posterior hip precautions and has soft c-collar for odontoid fracture    PT Comments    Pt reports increased pain and soreness from activity yesterday.  Pt assisted with ambulating however kept distance shorter per pt preference.  Pt will likely d/c home tomorrow.  Will need to practice steps with spouse present prior to d/c.   Follow Up Recommendations  Home health PT;Supervision/Assistance - 24 hour     Equipment Recommendations  None recommended by PT    Recommendations for Other Services       Precautions / Restrictions Precautions Precautions: Cervical;Posterior Hip Precaution Comments: posterior hip precautions and cervical precautions reviewed with pt Required Braces or Orthoses: Cervical Brace Cervical Brace: Soft collar Restrictions Weight Bearing Restrictions: No Other Position/Activity Restrictions: WBAT    Mobility  Bed Mobility Overal bed mobility: Needs Assistance Bed Mobility: Supine to Sit     Supine to sit: Min assist     General bed mobility comments: verbal cues for safe technique within precautions, assist mostly for R LE  Transfers Overall transfer level: Needs assistance Equipment used: Rolling walker (2 wheeled) Transfers: Sit to/from Stand Sit to Stand: Min guard Stand pivot transfers: Min guard       General transfer comment: verbal cues for UE and LE positioning, maintaining precautions  Ambulation/Gait Ambulation/Gait assistance: Min guard Ambulation Distance (Feet): 50 Feet Assistive device: Rolling walker (2 wheeled) Gait Pattern/deviations: Step-to pattern;Antalgic     General Gait Details: pt reports increased soreness  today so distance limited, verbal cues for maintaining precautions   Stairs            Wheelchair Mobility    Modified Rankin (Stroke Patients Only)       Balance                                    Cognition Arousal/Alertness: Awake/alert Behavior During Therapy: WFL for tasks assessed/performed Overall Cognitive Status: Within Functional Limits for tasks assessed                      Exercises      General Comments        Pertinent Vitals/Pain Pain Assessment: 0-10 Pain Score: 6  Pain Location: R hip, neck Pain Descriptors / Indicators: Aching;Sore Pain Intervention(s): Monitored during session;Limited activity within patient's tolerance;Premedicated before session;Repositioned    Home Living                      Prior Function            PT Goals (current goals can now be found in the care plan section) Progress towards PT goals: Progressing toward goals    Frequency  Min 5X/week    PT Plan Current plan remains appropriate    Co-evaluation             End of Session Equipment Utilized During Treatment: Cervical collar;Gait belt Activity Tolerance: Patient tolerated treatment well Patient left: with call bell/phone within reach;in chair;with chair alarm set     Time: [id number] PT Time Calculation (min) (ACUTE ONLY): 22 min  Charges:  $  Gait Training: 8-22 mins                    G Codes:      Viktoriya Glaspy,KATHrine E 02/16/2016, 12:20 PM Carmelia Bake, PT, DPT 16-Feb-2016 Pager: [phone]

## 2016-01-25 NOTE — Care Management Note (Addendum)
Case Management Note  Patient Details  Name: MAGABY RUMBERGER MRN: [id number] Date of Birth: Jan 05, 1925  Subjective/Objective:                  ARTHROPLASTY BIPOLAR HIP (HEMIARTHROPLASTY) (Right) Action/Plan: Discharge planning Expected Discharge Date:                  Expected Discharge Plan:  [place]  In-House Referral:     Discharge planning Services  CM Consult  Post Acute Care Choice:    Choice offered to:  Patient  DME Arranged:  N/A DME Agency:  NA  HH Arranged:  PT HH Agency:  [facility]  Status of Service:  Completed, signed off  Medicare Important Message Given:  Yes Date Medicare IM Given:    Medicare IM give by:    Date Additional Medicare IM Given:    Additional Medicare Important Message give by:     If discussed at [place] of Stay Meetings, dates discussed:    Additional Comments: CM met with pt in room to offer choice of home health agency.  Pt chooses [facility] to render HHPT/OT/aide/SW.  Referral given to [employer], Tim.  Physical address of pt is:              [address] [place], [place] [address]      Pt states she is going home with husband and has both a rolling walker and a 3n1 at home.  No other CM needs were communicated. Dellie Catholic, RN 01/25/2016, 10:57 AM

## 2016-01-26 DIAGNOSIS — W19XXXD Unspecified fall, subsequent encounter: Secondary | ICD-10-CM

## 2016-01-26 DIAGNOSIS — D62 Acute posthemorrhagic anemia: Secondary | ICD-10-CM | POA: Diagnosis present

## 2016-01-26 DIAGNOSIS — S12100D Unspecified displaced fracture of second cervical vertebra, subsequent encounter for fracture with routine healing: Secondary | ICD-10-CM

## 2016-01-26 LAB — CBC
HCT: 26.4 % — ABNORMAL LOW (ref 36.0–46.0)
HEMOGLOBIN: 8.3 g/dL — AB (ref 12.0–15.0)
MCH: 30.1 pg (ref 26.0–34.0)
MCHC: 31.4 g/dL (ref 30.0–36.0)
MCV: 95.7 fL (ref 78.0–100.0)
PLATELETS: 228 10*3/uL (ref 150–400)
RBC: 2.76 MIL/uL — ABNORMAL LOW (ref 3.87–5.11)
RDW: 14 % (ref 11.5–15.5)
WBC: 4.8 10*3/uL (ref 4.0–10.5)

## 2016-01-26 MED ORDER — FERROUS SULFATE 325 (65 FE) MG PO TBEC: 325.0000 mg | DELAYED_RELEASE_TABLET | Freq: Three times a day (TID) | ORAL | Status: DC

## 2016-01-26 MED ORDER — METHOCARBAMOL 500 MG PO TABS: 500.0000 mg | ORAL_TABLET | Freq: Three times a day (TID) | ORAL | Status: DC

## 2016-01-26 MED ORDER — HYDROCODONE-ACETAMINOPHEN 5-325 MG PO TABS: 1.0000 | ORAL_TABLET | ORAL | Status: DC | PRN

## 2016-01-26 MED ORDER — SENNOSIDES-DOCUSATE SODIUM 8.6-50 MG PO TABS: 2.0000 | ORAL_TABLET | Freq: Every day | ORAL | Status: DC

## 2016-01-26 MED ORDER — OXYCODONE HCL ER 10 MG PO T12A: 10.0000 mg | EXTENDED_RELEASE_TABLET | Freq: Every day | ORAL | Status: DC

## 2016-01-26 MED ORDER — FOLIC ACID 1 MG PO TABS: 1.0000 mg | ORAL_TABLET | Freq: Every day | ORAL | Status: DC

## 2016-01-26 MED ORDER — METHOCARBAMOL 500 MG PO TABS: 500.0000 mg | ORAL_TABLET | Freq: Four times a day (QID) | ORAL | Status: DC | PRN

## 2016-01-26 MED ORDER — ONDANSETRON HCL 4 MG PO TABS: 4.0000 mg | ORAL_TABLET | Freq: Three times a day (TID) | ORAL | Status: DC | PRN

## 2016-01-26 MED ORDER — ENOXAPARIN SODIUM 40 MG/0.4ML ~~LOC~~ SOLN: 40.0000 mg | SUBCUTANEOUS | Status: DC

## 2016-01-26 MED ORDER — LEVOTHYROXINE SODIUM 137 MCG PO TABS: 137.0000 ug | ORAL_TABLET | Freq: Every day | ORAL | Status: DC

## 2016-01-26 NOTE — Discharge Summary (Addendum)
[name], is a 82 y.o. female  DOB [birth year]  MRN [id number].  Admission date:  01/21/2016  Admitting Physician  Ivor Costa, MD  Discharge Date:  01/26/2016   Primary MD  Merrilee Seashore, MD  Recommendations for primary care physician for things to follow:   Repeat CBC within 1 wk (post Anemia) recheck TSH in 6-8 weeks as outpatient. Use Neck Collar until seen by neurosurgeon  Admission Diagnosis  Fall, initial encounter [W19.XXXA] Closed right hip fracture, initial encounter [facility]) [S72.001A] Odontoid fracture, closed, initial encounter [facility]) [place]   Discharge Diagnosis  Fall, initial encounter [W19.XXXA] Closed right hip fracture, initial encounter ([place]) [S72.001A] Odontoid fracture, closed, initial encounter ([place]) [S12.100A]    Principal Problem:   Fracture of femoral neck, right, closed Active Problems:   Hypertension   Hx of thyroid cancer   Hypokalemia   Hypothyroidism   Protein-calorie malnutrition, moderate (HCC)   DDD (degenerative disc disease), lumbosacral   GERD (gastroesophageal reflux disease)   Closed displaced fracture of right femoral neck (HCC)   Fall   Odontoid fracture (HCC)   Closed right hip fracture (HCC)   Postoperative anemia due to acute blood loss      Past Medical History  Diagnosis Date  . Angina   . Headache(784.0)   . Hypoglycemia   . Fracture 2008    back  . Macular degeneration of both eyes   . Glaucoma   . Hypothyroidism   . Blood transfusion   . Anemia   . GERD (gastroesophageal reflux disease)   . H/O hiatal hernia   . Arthritis   . Chronic back pain   . Thyroid cancer ([place]) 1986  . Shortness of breath 11/08/11    "here lately, all the time"  . Exertional dyspnea 01/17/12  . Osteomyelitis ([place]) 11/2011    right knee  . Hypertension   . Pneumonia 01/2010  . Metabolic acidemia [id number]    Past Surgical History  Procedure  Laterality Date  . Thyroidectomy  1986  . Shoulder arthroscopy w/ rotator cuff repair    . Trigger finger release      right  thumb  . Abdominal hysterectomy  1963  . Carpal tunnel release      bilaterally  . Tonsillectomy and adenoidectomy    . Appendectomy    . Dilation and curettage of uterus    . Cataract extraction w/ intraocular lens  implant, bilateral    . Joint replacement  2005, 2006, 2012, 11/10/11    left; left; right; right; right  . Goiter removed  1980's    "when I was young"  . Fracture surgery      "broke back"  . Hand or  09/2011    "left hand; bones were coming together"  . I&d knee with poly exchange  11/10/2011    Procedure: IRRIGATION AND DEBRIDEMENT KNEE WITH POLY EXCHANGE;  Surgeon: Yvette Rack., MD;  Location: [place];  Service: Orthopedics;  Laterality: Right;  . Post polyethelene exchange  11/09/11  RLE  . Back surgery      "3 back OR's;"; S/P "fractured back 2008"  . Peripherally inserted central catheter insertion  11/2011    "removed 12/2011; LUE"  . Total knee revision  04/28/2012    Procedure: TOTAL KNEE REVISION;  Surgeon: Rudean Haskell, MD;  Location: [place];  Service: Orthopedics;  Laterality: Right;  . Hip arthroplasty Right 01/23/2016    Procedure: ARTHROPLASTY BIPOLAR HIP (HEMIARTHROPLASTY);  Surgeon: Gaynelle Arabian, MD;  Location: [facility];  Service: Orthopedics;  Laterality: Right;  Depuy       HPI  from the history and physical done on the day of admission:    Patient coming from: The patient is coming from home. At baseline, he is independent for most of his ADL.   Chief Complaint: Right hip pain and neck pain after fall  HPI: [name] is a 82 y.o. female with medical history significant of hypertension, glaucoma, macular degeneration, chronic back pain, GERD, hypothyroidism, thyroid cancer, who presents with right hip pain and neck pain after fall.  Patient states that she had a fall when was getting up from her table, turned and  tripped on her dog at about 1:30 PM. She landed on the right side. She did not have any prodromal symptoms leading to the fall. She developed pain over right hip and neck. No LOC. Her right hip is severe, 10 out of 10 in severity, sharp, nonradiating, constant. Her neck pain is moderate, constant, 6 out of 10 in severity, nonradiating. She has right leg muscle spasm, but no numbness or tingling in her legs. Patient does not have unilateral weakness, fever, chills, chest pain, shortness breath, cough, nausea, vomiting, abdominal pain or symptoms of a UTI. She has loose stool which she attributes to laxatives she is taking.  ED Course: pt was found to have WBC 13.9, temperature normal, no tachycardia, potassium 3.4, creatinine 0.91, chest x-ray showed COPD change, but no infiltration. CT head is negative for intracranial abnormalities. CT of C-spine showed slightly displaced fracture within the C2 odontoid process, with surrounding erosive change that may indicate chronic fracturedisplacement. There is a slight posterior tilt of the odontoid and up to 3 mm cortical displacement anteriorly. No associated central canal narrowing; additional nondisplaced fracture within the left lamina of the C1 vertebral body, of uncertain age. Fracture diastasis within the right lamina of C1 is also of uncertain age but has a more chronic appearance, and may be congenital. X-Ray of right hip/pelvis showed significantly displaced fracture of the right femoral neck, subcapital region. Pt is admitted for further evaluation and treatment. Orthopedic surgeon and neurosurgeon were consulted by EP.     Hospital Course:    Lovenox 40 mg subcutaneous daily for 1 month for DVT prophylaxis (s/p Rt Hip ORIF)    Brief interval history  Patient is a 82 year old female with history of hypertension, glaucoma, macular degeneration, chronic back pain hypothyroidism who had a mechanical fall leading to a right femoral neck fracture and  odontoid fracture and possible C1 ring fracture. Patient was admitted placement c-collar neurosurgery was consulted who recommended outpatient follow-up. Patient was also seen by orthopedics and underwent right hip hemiarthroplasty on 01/23/2016. PT OT following. HomeWith home health on 01/26/16  #1 Right Femoral Neck Fracture- Secondary to mechanical fall. status post right hip hemiarthroplasty 01/23/2016 by Dr Wynelle Link.  C/n OxyContin 10 mg daily and vicodin prn. Continue Robaxin 500 mg tid.   #2 Leukocytosis- resolved, no fever, no evidence of  active infection Likely secondary to stress-induced demargination. No signs or symptoms of infection.   #3 Hypertension- stable, c/n propranolol.  #4 Hypothyroidism- TSH of 0.140. Decreased dose of Synthroid, recheck TSH in 6-8 weeks as outpatient.  #5 Odontoid Fracture and Possible C1 Ring Fracture Per admission history and physical EDP spoke with neurosurgery, Dr. Joya Salm who recommended starting patient on c-collar with outpatient follow-up.  #6 Gastroesophageal Reflux Disease - c/n PPI.  #7 Moderate protein calorie Malnutrition- encourage increased intake  #8 Chronic mild lower extremity edema - improved, may use diuretics when necessary, 2-D echo with EF of 60-65% with no wall motion abnormalities. Grade 1 diastolic dysfunction.  #9 Post op Anemia- give iron sulfate and folic acid, repeat CBC within a week with PCP  #10 Acute urinary retention-in the postop patient, Place Foley catheter, follow-up with urology whithin a week for evaluation and possible Foley Removal. Appointment with Dr. Matilde Sprang with [facility]  Code Status : DNR  Disposition Plan : Home with Home health PT on 01/26/16, case manager at bedside at the time of my evaluation on 01/25/2016  Consults : Ortho and Neurosurgery  Procedures : ORIF of Rt Hip Fx on 01/23/16  DVT Prophylaxis : Lovenox 40 mg subcutaneous daily for 1 month only for DVT prophylaxis in the  hip surgery patient  Discharge Condition: stable  Follow UP  Follow-up Information    Follow up with [facility].   Why:  home health occupational and physical therapy, aide, and nurse   Contact information:   [place] [address] [phone]       Follow up with Gearlean Alf, MD. Schedule an appointment as soon as possible for a visit on 02/07/2016.   Specialty:  Orthopedic Surgery   Why:  Call office at [phone] to setup appointment on Tuesday 02/07/2016 with Dr. Wynelle Link.   Contact information:   [address] [place] [address] [phone]       Follow up with Gearlean Alf, MD In 2 weeks.   Specialty:  Orthopedic Surgery   Why:  For suture removal, For wound re-check   Contact information:   [address] [place] [address] [phone]       Follow up with Eustace Moore, MD In 1 week.   Specialty:  Neurosurgery   Why:  C-spine fracture   Contact information:   [address] [place] [address] [phone]       Follow up with [facility], MD In 5 days.   Specialty:  Internal Medicine   Why:  Repeat CBC within 1 wk (post Anemia), Repeat TSH in 6 wks, Refer to Neurosurgery   Contact information:   [place] [address] [phone]       Follow up with MACDIARMID,SCOTT A, MD In 6 days.   Specialty:  Urology   Why:  Urinary retention, status post foley cath placement   Contact information:   [place] [address] [phone]        Consults obtained - Ortho and Neurosurgery  Diet and Activity recommendation: See Discharge Instructions below  Discharge Instructions  - Repeat CBC within 1 wk (post Anemia) recheck TSH in 6-8 weeks as outpatient. Use Neck Collar until seen by neurosurgeon   Discharge Instructions    Call MD / Call 911    Complete by:  As directed   If you experience chest pain or  shortness  of breath, CALL 911 and be transported to the hospital emergency room.  If you develope a fever above 101 F, pus (white drainage) or increased drainage or redness at the wound, or calf pain, call your surgeon's office.     Change dressing    Complete by:  As directed   You may change your dressing dressing daily with sterile 4 x 4 inch gauze dressing and paper tape.  Do not submerge the incision under water.     Discharge instructions    Complete by:  As directed   Pick up stool softner and laxative for home use following surgery while on pain medications. Do not submerge incision under water. Please use good hand washing techniques while changing dressing each day. May shower starting three days after surgery. Please use a clean towel to pat the incision dry following showers. Continue to use ice for pain and swelling after surgery. Do not use any lotions or creams on the incision until instructed by your surgeon. Hip precautions.  Total Hip Protocol.  Take a 325 mg Aspirin twice a day for three weeks and then reduce to a baby 81 mg Aspirin daily for three additional weeks.  Postoperative Constipation Protocol  Constipation - defined medically as fewer than three stools per week and severe constipation as less than one stool per week.  One of the most common issues patients have following surgery is constipation.  Even if you have a regular bowel pattern at home, your normal regimen is likely to be disrupted due to multiple reasons following surgery.  Combination of anesthesia, postoperative narcotics, change in appetite and fluid intake all can affect your bowels.  In order to avoid complications following surgery, here are some recommendations in order to help you during your recovery period.  Colace (docusate) - Pick up an over-the-counter form of Colace or another stool softener and take twice a day as long as you are requiring postoperative pain medications.  Take with a full  glass of water daily.  If you experience loose stools or diarrhea, hold the colace until you stool forms back up.  If your symptoms do not get better within 1 week or if they get worse, check with your doctor.  Dulcolax (bisacodyl) - Pick up over-the-counter and take as directed by the product packaging as needed to assist with the movement of your bowels.  Take with a full glass of water.  Use this product as needed if not relieved by Colace only.   MiraLax (polyethylene glycol) - Pick up over-the-counter to have on hand.  MiraLax is a solution that will increase the amount of water in your bowels to assist with bowel movements.  Take as directed and can mix with a glass of water, juice, soda, coffee, or tea.  Take if you go more than two days without a movement. Do not use MiraLax more than once per day. Call your doctor if you are still constipated or irregular after using this medication for 7 days in a row.  If you continue to have problems with postoperative constipation, please contact the office for further assistance and recommendations.  If you experience "the worst abdominal pain ever" or develop nausea or vomiting, please contact the office immediatly for further recommendations for treatment.     Do not sit on low chairs, stoools or toilet seats, as it may be difficult to get up from low surfaces    Complete by:  As directed  Driving restrictions    Complete by:  As directed   No driving until released by the physician.     Follow the hip precautions as taught in Physical Therapy    Complete by:  As directed      Increase activity slowly as tolerated    Complete by:  As directed      Lifting restrictions    Complete by:  As directed   No lifting until released by the physician.     Patient may shower    Complete by:  As directed   You may shower without a dressing once there is no drainage.  Do not wash over the wound.  If drainage remains, do not shower until drainage stops.       TED hose    Complete by:  As directed   Use stockings (TED hose) for 3 weeks on both leg(s).  You may remove them at night for sleeping.     Weight bearing as tolerated    Complete by:  As directed   Laterality:  right  Extremity:  Lower             Discharge Medications       Medication List    STOP taking these medications        docusate sodium 100 MG capsule  Commonly known as:  COLACE     senna 8.6 MG Tabs tablet  Commonly known as:  SENOKOT      TAKE these medications        brimonidine 0.2 % ophthalmic solution  Commonly known as:  ALPHAGAN  Place 1 drop into both eyes 2 (two) times daily.     dorzolamide-timolol 22.3-6.8 MG/ML ophthalmic solution  Commonly known as:  COSOPT  Place 1 drop into both eyes 2 (two) times daily.     enoxaparin 40 MG/0.4ML injection  Commonly known as:  LOVENOX  Inject 0.4 mLs (40 mg total) into the skin daily. Lovenox 40 mg subcutaneous daily for 1 month for DVT prophylaxis (s/p Rt Hip ORIF)     ferrous sulfate 325 (65 FE) MG EC tablet  Take 1 tablet (325 mg total) by mouth 3 (three) times daily with meals.     folic acid 1 MG tablet  Commonly known as:  FOLVITE  Take 1 tablet (1 mg total) by mouth daily.     gabapentin 300 MG capsule  Commonly known as:  NEURONTIN  Take 600 mg by mouth 3 (three) times daily.     HYDROcodone-acetaminophen 5-325 MG tablet  Commonly known as:  NORCO/VICODIN  Take 1 tablet by mouth every 4 (four) hours as needed for moderate pain.     latanoprost 0.005 % ophthalmic solution  Commonly known as:  XALATAN  Place 1 drop into both eyes at bedtime.     levothyroxine 137 MCG tablet  Commonly known as:  SYNTHROID, LEVOTHROID  Take 1 tablet (137 mcg total) by mouth daily before breakfast.     methocarbamol 500 MG tablet  Commonly known as:  ROBAXIN  Take 1 tablet (500 mg total) by mouth 3 (three) times daily.     ondansetron 4 MG tablet  Commonly known as:  ZOFRAN  Take 4 mg by mouth  2 (two) times daily as needed for nausea or vomiting.     ondansetron 4 MG tablet  Commonly known as:  ZOFRAN  Take 1 tablet (4 mg total) by mouth every 8 (eight) hours as needed for nausea.  oxyCODONE 15 MG immediate release tablet  Commonly known as:  ROXICODONE  Take 15 mg by mouth every 4 (four) hours as needed for pain.     oxyCODONE 10 mg 12 hr tablet  Commonly known as:  OXYCONTIN  Take 1 tablet (10 mg total) by mouth daily.     polyethylene glycol packet  Commonly known as:  MIRALAX / GLYCOLAX  Take 17 g by mouth 2 (two) times daily.     propranolol 20 MG tablet  Commonly known as:  INDERAL  Take 20 mg by mouth daily. Takes for migraines.     saccharomyces boulardii 250 MG capsule  Commonly known as:  FLORASTOR  Take 1 capsule (250 mg total) by mouth 2 (two) times daily.     senna-docusate 8.6-50 MG tablet  Commonly known as:  Senokot-S  Take 2 tablets by mouth at bedtime.        Major procedures and Radiology Reports - PLEASE review detailed and final reports for all details, in brief -   Dg Chest 1 View  01/21/2016  CLINICAL DATA:  Fall, right hip pain. EXAM: CHEST 1 VIEW COMPARISON:  Chest x-rays dated 02/18/2014 and 01/2015 2013. FINDINGS: Cardiomediastinal silhouette is stable in size and configuration. Lungs appear hyperexpanded. Lungs are clear. No pleural effusion or pneumothorax seen. Osseous structures about the chest are unremarkable. IMPRESSION: 1. Lungs appear hyperexpanded suggesting COPD/emphysema. 2. Lungs are clear and there is no evidence of acute cardiopulmonary abnormality. Electronically Signed   By: Franki Cabot M.D.   On: 01/21/2016 18:49   Ct Head Wo Contrast  01/21/2016  CLINICAL DATA:  Fall, right hip pain. EXAM: CT HEAD WITHOUT CONTRAST CT CERVICAL SPINE WITHOUT CONTRAST TECHNIQUE: Multidetector CT imaging of the head and cervical spine was performed following the standard protocol without intravenous contrast. Multiplanar CT image  reconstructions of the cervical spine were also generated. COMPARISON:  None. FINDINGS: CT HEAD FINDINGS There is mild generalized brain atrophy with commensurate dilatation of the ventricles and sulci. Mild chronic small vessel ischemic change noted within the deep periventricular white matter regions bilaterally. There is no mass, hemorrhage, edema or other evidence of acute parenchymal abnormality. No extra-axial hemorrhage. No skull fracture. Visualized upper paranasal sinuses are clear. CT CERVICAL SPINE FINDINGS Extensive degenerative change throughout the cervical spine. There is a slightly displaced fracture within the odontoid process, with slight posterior tilt of the odontoid, with surrounding erosive change that may indicate chronic fracture displacement. There is also a nondisplaced fracture within the left lamina of C1 which is of uncertain age. Fracture diastasis, with small adjacent bony fragments, also noted within the right lamina of C1 which is also of uncertain age but more likely chronic, possibly congenital. No other fracture line or displaced fracture fragment is identified within the cervical spine. Facet joints of the C2 through T1 are normally aligned. Atherosclerotic calcifications are noted at each carotid bulb region. Paravertebral soft tissues are otherwise unremarkable. IMPRESSION: 1. Slightly displaced fracture within the C2 odontoid process, with surrounding erosive change that may indicate chronic fracture displacement. There is a slight posterior tilt of the odontoid and up to 3 mm cortical displacement anteriorly. No associated central canal narrowing. 2. Additional nondisplaced fracture within the left lamina of the C1 vertebral body, of uncertain age. Fracture diastasis within the right lamina of C1 is also of uncertain age but has a more chronic appearance, and may be congenital. 3. No acute intracranial findings. No intracranial mass, hemorrhage or edema. These results  were  called by telephone at the time of interpretation on 01/21/2016 at 7:15 pm to Dr. Brantley Stage , who verbally acknowledged these results. Electronically Signed   By: Franki Cabot M.D.   On: 01/21/2016 19:17   Ct Cervical Spine Wo Contrast  01/21/2016  CLINICAL DATA:  Fall, right hip pain. EXAM: CT HEAD WITHOUT CONTRAST CT CERVICAL SPINE WITHOUT CONTRAST TECHNIQUE: Multidetector CT imaging of the head and cervical spine was performed following the standard protocol without intravenous contrast. Multiplanar CT image reconstructions of the cervical spine were also generated. COMPARISON:  None. FINDINGS: CT HEAD FINDINGS There is mild generalized brain atrophy with commensurate dilatation of the ventricles and sulci. Mild chronic small vessel ischemic change noted within the deep periventricular white matter regions bilaterally. There is no mass, hemorrhage, edema or other evidence of acute parenchymal abnormality. No extra-axial hemorrhage. No skull fracture. Visualized upper paranasal sinuses are clear. CT CERVICAL SPINE FINDINGS Extensive degenerative change throughout the cervical spine. There is a slightly displaced fracture within the odontoid process, with slight posterior tilt of the odontoid, with surrounding erosive change that may indicate chronic fracture displacement. There is also a nondisplaced fracture within the left lamina of C1 which is of uncertain age. Fracture diastasis, with small adjacent bony fragments, also noted within the right lamina of C1 which is also of uncertain age but more likely chronic, possibly congenital. No other fracture line or displaced fracture fragment is identified within the cervical spine. Facet joints of the C2 through T1 are normally aligned. Atherosclerotic calcifications are noted at each carotid bulb region. Paravertebral soft tissues are otherwise unremarkable. IMPRESSION: 1. Slightly displaced fracture within the C2 odontoid process, with surrounding erosive change  that may indicate chronic fracture displacement. There is a slight posterior tilt of the odontoid and up to 3 mm cortical displacement anteriorly. No associated central canal narrowing. 2. Additional nondisplaced fracture within the left lamina of the C1 vertebral body, of uncertain age. Fracture diastasis within the right lamina of C1 is also of uncertain age but has a more chronic appearance, and may be congenital. 3. No acute intracranial findings. No intracranial mass, hemorrhage or edema. These results were called by telephone at the time of interpretation on 01/21/2016 at 7:15 pm to Dr. Brantley Stage , who verbally acknowledged these results. Electronically Signed   By: Franki Cabot M.D.   On: 01/21/2016 19:17   Pelvis Portable  01/23/2016  CLINICAL DATA:  Status post right hip replacement EXAM: PORTABLE PELVIS 1-2 VIEWS COMPARISON:  None. FINDINGS: Right hip replacement is now seen. No acute bony abnormality is noted. Surgical drain is noted in place. Pelvic ring is intact. IMPRESSION: Status post right hip replacement.  No acute abnormality noted. These results will be called to the ordering clinician or representative by the Radiologist Assistant, and communication documented in the PACS or zVision Dashboard. Electronically Signed   By: Inez Catalina M.D.   On: 01/23/2016 15:31   Dg Hip Unilat  With Pelvis 2-3 Views Right  01/21/2016  CLINICAL DATA:  Fall, right hip pain. EXAM: DG HIP (WITH OR WITHOUT PELVIS) 2-3V RIGHT COMPARISON:  None. FINDINGS: Markedly displaced fracture within the subcapital region of the right femoral neck. Right femoral head remains well positioned relative to the acetabulum. IMPRESSION: Significantly displaced fracture of the right femoral neck, subcapital region. Electronically Signed   By: Franki Cabot M.D.   On: 01/21/2016 18:48    Micro Results   Recent Results (from the past 240  hour(s))  Surgical PCR screen     Status: None   Collection Time: 01/22/16  5:39 AM    Result Value Ref Range Status   MRSA, PCR NEGATIVE NEGATIVE Final   Staphylococcus aureus NEGATIVE NEGATIVE Final    Comment:        The Xpert SA Assay (FDA approved for NASAL specimens in patients over 53 years of age), is one component of a comprehensive surveillance program.  Test performance has been validated by [facility] for patients greater than or equal to 60 year old. It is not intended to diagnose infection nor to guide or monitor treatment.        Today   Subjective    [name] today has no headache,no chest abdominal pain,no new weakness tingling or numbness, feels much better wants to go home today with home health PT   Objective   Blood pressure 124/41, pulse 75, temperature 97.9 F (36.6 C), temperature source Oral, resp. rate 17, height 5\' 1"  (1.549 m), weight 69.1 kg (152 lb 5.4 oz), SpO2 92 %.   Intake/Output Summary (Last 24 hours) at 01/26/16 1013 Last data filed at 01/26/16 0908  Gross per 24 hour  Intake    600 ml  Output    870 ml  Net   -270 ml    Exam Gen:- Awake Alert, Oriented X 3, No new F.N deficits, Normal affect HEENT:- [place].AT,PERRAL  Neck- C spine collar  Lungs- Symmetrical Chest wall movement, Good air movement bilaterally, CTAB  CV- S1, S2 normal Abd-  +ve B.Sounds, Abd Soft, No tenderness,    Extremity/Skin:- No Cyanosis, Clubbing or edema,   Rt Hip post op Wound is clean dry and intact GU- foley will be placed   Data Review   CBC w Diff: Lab Results  Component Value Date   WBC 4.8 01/26/2016   HGB 8.3* 01/26/2016   HCT 26.4* 01/26/2016   PLT 228 01/26/2016   LYMPHOPCT 4 01/21/2016   MONOPCT 7 01/21/2016   EOSPCT 0 01/21/2016   BASOPCT 0 01/21/2016    CMP: Lab Results  Component Value Date   NA 133* 01/25/2016   K 4.2 01/25/2016   CL 97* 01/25/2016   CO2 30 01/25/2016   BUN 15 01/25/2016   CREATININE 0.86 01/25/2016   CREATININE 0.85 12/18/2011   PROT 4.5* 02/19/2014   ALBUMIN 2.2* 02/19/2014    BILITOT 0.4 02/19/2014   ALKPHOS 75 02/19/2014   AST 10 02/19/2014   ALT <5 02/19/2014  .   Total Time in preparing paper work, data evaluation and todays exam - 35 minutes  Dwayne Bulkley M.D on 01/26/2016 at 10:13 AM  [facility]   Office  [phone]  Dragon dictation system was used to create this note, attempts have been made to correct errors, however presence of uncorrected errors is not a reflection quality of care provided

## 2016-01-26 NOTE — Progress Notes (Signed)
Occupational Therapy Treatment Patient Details Name: BRANDYN STEINERT MRN: [id number] DOB: June 25, 1925 Today's Date: 01/26/2016    History of present illness Pt is an 82 year old female s/p fall at home resulting in right femoral neck fracture and odontoid fracture.  Pt currently s/p R hip hemiarthroplasty with posterior hip precautions and has soft c-collar for odontoid fracture   OT comments  Pt premedicated but still with increased pain.  Able to ambulate to bathroom then repositioned in chair  Follow Up Recommendations  Home health OT;Supervision/Assistance - 24 hour    Equipment Recommendations  None recommended by OT    Recommendations for Other Services      Precautions / Restrictions Precautions Precautions: Cervical;Posterior Hip Precaution Comments: posterior hip precautions and cervical precautions reviewed with pt Required Braces or Orthoses: Cervical Brace Cervical Brace: Soft collar Restrictions Other Position/Activity Restrictions: WBAT       Mobility Bed Mobility         Supine to sit: Min assist     General bed mobility comments: assist for RLE.    Transfers   Equipment used: Rolling walker (2 wheeled) Transfers: Sit to/from Stand Sit to Stand: Min guard Stand pivot transfers: Min guard       General transfer comment: vcs for UE/LE placement    Balance                                   ADL       Grooming: Wash/dry face;Min guard;Standing                   Toilet Transfer: Min guard;Ambulation;BSC;RW             General ADL Comments: ambulated to bathroom and sat on commode, but no results as catheter was just removed today.  Pt states she does not plan to get into shower any time soon (and she is wearing soft collar AAT).  Pt c/o tightness of collar, but she is able to place fingers inbetween her neck and collar--explained importance of keeping this on snugly for support.  Pt followed thps (posterior) but did need  cues to extend leg when sitting      Vision                     Perception     Praxis      Cognition   Behavior During Therapy: WFL for tasks assessed/performed Overall Cognitive Status: Within Functional Limits for tasks assessed                       Extremity/Trunk Assessment               Exercises     Shoulder Instructions       General Comments      Pertinent Vitals/ Pain       Pain Assessment: 0-10 Pain Score: 8  Pain Location: R hip Pain Descriptors / Indicators: Aching Pain Intervention(s): Limited activity within patient's tolerance;Monitored during session;Premedicated before session;Repositioned;Ice applied  Home Living                                          Prior Functioning/Environment              Frequency Min 2X/week  Progress Toward Goals  OT Goals(current goals can now be found in the care plan section)  Progress towards OT goals: Progressing toward goals     Plan      Co-evaluation                 End of Session     Activity Tolerance Patient tolerated treatment well   Patient Left in chair;with call bell/phone within reach;with chair alarm set   Nurse Communication  (check buttocks skin)        Time: [id number] OT Time Calculation (min): 19 min  Charges: OT General Charges $OT Visit: 1 Procedure OT Treatments $Self Care/Home Management : 8-22 mins  Simeon Vera 01/26/2016, 10:19 AM  Lesle Chris, OTR/L [phone] 01/26/2016

## 2016-01-26 NOTE — Progress Notes (Addendum)
Subjective: 3 Days Post-Op Procedure(s) (LRB): ARTHROPLASTY BIPOLAR HIP (HEMIARTHROPLASTY) (Right) Patient reports pain as mild.   Patient seen in rounds with Dr. Wynelle Link. Patient is well, but has had some minor complaints of pain in the hip, requiring pain medications Patient is ready to go home today from an ortho standpoint.  Objective: Vital signs in last 24 hours: Temp:  [97.8 F (36.6 C)-98.1 F (36.7 C)] 97.9 F (36.6 C) (05/25 0536) Pulse Rate:  [69-75] 75 (05/25 0536) Resp:  [17] 17 (05/25 0536) BP: (124-134)/(41-44) 124/41 mmHg (05/25 0536) SpO2:  [90 %-92 %] 92 % (05/25 0536)  Intake/Output from previous day:  Intake/Output Summary (Last 24 hours) at 01/26/16 0758 Last data filed at 01/26/16 0536  Gross per 24 hour  Intake    600 ml  Output    870 ml  Net   -270 ml   Labs:  Recent Labs  01/24/16 0416 01/25/16 0415 01/26/16 0352  HGB 9.3* 8.6* 8.3*    Recent Labs  01/25/16 0415 01/26/16 0352  WBC 5.1 4.8  RBC 2.84* 2.76*  HCT 27.1* 26.4*  PLT 207 228    Recent Labs  01/24/16 0416 01/25/16 0415  NA 134* 133*  K 4.4 4.2  CL 98* 97*  CO2 29 30  BUN 15 15  CREATININE 0.79 0.86  GLUCOSE 134* 119*  CALCIUM 8.0* 7.8*   No results for input(s): LABPT, INR in the last 72 hours.  EXAM: General - Patient is Alert and Appropriate Extremity - Neurovascular intact Sensation intact distally Dorsiflexion/Plantar flexion intact Incision - clean, dry, no drainage Motor Function - intact, moving foot and toes well on exam.   Assessment/Plan: 3 Days Post-Op Procedure(s) (LRB): ARTHROPLASTY BIPOLAR HIP (HEMIARTHROPLASTY) (Right) Procedure(s) (LRB): ARTHROPLASTY BIPOLAR HIP (HEMIARTHROPLASTY) (Right) Past Medical History  Diagnosis Date  . Angina   . Headache(784.0)   . Hypoglycemia   . Fracture 2008    back  . Macular degeneration of both eyes   . Glaucoma   . Hypothyroidism   . Blood transfusion   . Anemia   . GERD (gastroesophageal  reflux disease)   . H/O hiatal hernia   . Arthritis   . Chronic back pain   . Thyroid cancer ([place]) 1986  . Shortness of breath 11/08/11    "here lately, all the time"  . Exertional dyspnea 01/17/12  . Osteomyelitis ([facility]) 11/2011    right knee  . Hypertension   . Pneumonia 01/2010  . Metabolic acidemia 123456   Principal Problem:   Fracture of femoral neck, right, closed Active Problems:   Hypertension   Hx of thyroid cancer   Hypokalemia   Hypothyroidism   Protein-calorie malnutrition, moderate (HCC)   DDD (degenerative disc disease), lumbosacral   GERD (gastroesophageal reflux disease)   Closed displaced fracture of right femoral neck (HCC)   Fall   Odontoid fracture (HCC)   Closed right hip fracture (HCC)  Estimated body mass index is 28.8 kg/(m^2) as calculated from the following:   Height as of this encounter: 5\' 1"  (1.549 m).   Weight as of this encounter: 69.1 kg (152 lb 5.4 oz). Up with therapy Discharge home with home health as per the Medical Team Follow up - in 2 weeks with Dr. Wynelle Link Activity - WBAT to the right leg D/C Meds - See DC Summary DVT Prophylaxis - Lovenox while in hospital but will switch to Aspirin for home.  Aspirin 325 mg twice a day for three weeks and then reduce  to a baby 81 mg Aspirin daily for three additional weeks.  RX placed on chart for pain medication and muscle relaxant.  Arlee Muslim, PA-C Orthopaedic Surgery 01/26/2016, 7:58 AM

## 2016-01-26 NOTE — Progress Notes (Signed)
1845  Patient voiding but retaining urine Dr Joesph Fillers notified order received. Foley cath reinserted with clear urine returned.  Patient education on foley care with patient and husband.   Bethann Punches RN

## 2016-01-26 NOTE — Discharge Instructions (Signed)
Dr. Gaynelle Arabian Total Joint Specialist [facility] [address] [place], [place] [address] [phone]   POSTERIOR  HIP REPLACEMENT POSTOPERATIVE DIRECTIONS  Hip Rehabilitation, Guidelines Following Surgery  The results of a hip operation are greatly improved after range of motion and muscle strengthening exercises. Follow all safety measures which are given to protect your hip. If any of these exercises cause increased pain or swelling in your joint, decrease the amount until you are comfortable again. Then slowly increase the exercises. Call your caregiver if you have problems or questions.   HOME CARE INSTRUCTIONS  Remove items at home which could result in a fall. This includes throw rugs or furniture in walking pathways.   ICE to the affected hip every three hours for 30 minutes at a time and then as needed for pain and swelling.  Continue to use ice on the hip for pain and swelling from surgery. You may notice swelling that will progress down to the foot and ankle.  This is normal after surgery.  Elevate the leg when you are not up walking on it.    Continue to use the breathing machine which will help keep your temperature down.  It is common for your temperature to cycle up and down following surgery, especially at night when you are not up moving around and exerting yourself.  The breathing machine keeps your lungs expanded and your temperature down.  DIET You may resume your previous home diet once your are discharged from the hospital.  DRESSING / WOUND CARE / SHOWERING You may shower 3 days after surgery, but keep the wounds dry during showering.  You may use an occlusive plastic wrap (Press'n Seal for example), NO SOAKING/SUBMERGING IN THE BATHTUB.  If the bandage gets wet, change with a clean dry gauze.  If the incision gets wet, pat the wound dry with a clean towel. You may start showering once you are discharged home but do not submerge the  incision under water. Just pat the incision dry and apply a dry gauze dressing on daily. Change the surgical dressing daily and reapply a dry dressing each time.    ACTIVITY Walk with your walker as instructed. Use walker as long as suggested by your caregivers. Avoid periods of inactivity such as sitting longer than an hour when not asleep. This helps prevent blood clots.  You may resume a sexual relationship in one month or when given the OK by your doctor.  You may return to work once you are cleared by your doctor.  Do not drive a car for 6 weeks or until released by you surgeon.  Do not drive while taking narcotics.  WEIGHT BEARING Weight bearing as tolerated with assist device (walker, cane, etc) as directed, use it as long as suggested by your surgeon or therapist, typically at least 4-6 weeks.  POSTOPERATIVE CONSTIPATION PROTOCOL Constipation - defined medically as fewer than three stools per week and severe constipation as less than one stool per week.  One of the most common issues patients have following surgery is constipation.  Even if you have a regular bowel pattern at home, your normal regimen is likely to be disrupted due to multiple reasons following surgery.  Combination of anesthesia, postoperative narcotics, change in appetite and fluid intake all can affect your bowels.  In order to avoid complications following surgery, here are some recommendations in order to help you during your recovery period.  Colace (docusate) - Pick up an over-the-counter  form of Colace or another stool softener and take twice a day as long as you are requiring postoperative pain medications.  Take with a full glass of water daily.  If you experience loose stools or diarrhea, hold the colace until you stool forms back up.  If your symptoms do not get better within 1 week or if they get worse, check with your doctor.  Dulcolax (bisacodyl) - Pick up over-the-counter and take as directed by the  product packaging as needed to assist with the movement of your bowels.  Take with a full glass of water.  Use this product as needed if not relieved by Colace only.   MiraLax (polyethylene glycol) - Pick up over-the-counter to have on hand.  MiraLax is a solution that will increase the amount of water in your bowels to assist with bowel movements.  Take as directed and can mix with a glass of water, juice, soda, coffee, or tea.  Take if you go more than two days without a movement. Do not use MiraLax more than once per day. Call your doctor if you are still constipated or irregular after using this medication for 7 days in a row.  If you continue to have problems with postoperative constipation, please contact the office for further assistance and recommendations.  If you experience "the worst abdominal pain ever" or develop nausea or vomiting, please contact the office immediatly for further recommendations for treatment.  ITCHING  If you experience itching with your medications, try taking only a single pain pill, or even half a pain pill at a time.  You can also use Benadryl over the counter for itching or also to help with sleep.   TED HOSE STOCKINGS Wear the elastic stockings on both legs for three weeks following surgery during the day but you may remove then at night for sleeping.  MEDICATIONS See your medication summary on the After Visit Summary that the nursing staff will review with you prior to discharge.  You may have some home medications which will be placed on hold until you complete the course of blood thinner medication.  It is important for you to complete the blood thinner medication as prescribed by your surgeon.  Continue your approved medications as instructed at time of discharge.  PRECAUTIONS If you experience chest pain or shortness of breath - call 911 immediately for transfer to the hospital emergency department.  If you develop a fever greater that 101 F, purulent  drainage from wound, increased redness or drainage from wound, foul odor from the wound/dressing, or calf pain - CONTACT YOUR SURGEON.                                                   FOLLOW-UP APPOINTMENTS Make sure you keep all of your appointments after your operation with your surgeon and caregivers. You should call the office at the above phone number and make an appointment for approximately two weeks after the date of your surgery or on the date instructed by your surgeon outlined in the "After Visit Summary".  RANGE OF MOTION AND STRENGTHENING EXERCISES  These exercises are designed to help you keep full movement of your hip joint. Follow your caregiver's or physical therapist's instructions. Perform all exercises about fifteen times, three times per day or as directed. Exercise both hips, even if you  have had only one joint replacement. These exercises can be done on a training (exercise) mat, on the floor, on a table or on a bed. Use whatever works the best and is most comfortable for you. Use music or television while you are exercising so that the exercises are a pleasant break in your day. This will make your life better with the exercises acting as a break in routine you can look forward to.  Lying on your back, slowly slide your foot toward your buttocks, raising your knee up off the floor. Then slowly slide your foot back down until your leg is straight again.  Lying on your back spread your legs as far apart as you can without causing discomfort.  Lying on your side, raise your upper leg and foot straight up from the floor as far as is comfortable. Slowly lower the leg and repeat.  Lying on your back, tighten up the muscle in the front of your thigh (quadriceps muscles). You can do this by keeping your leg straight and trying to raise your heel off the floor. This helps strengthen the largest muscle supporting your knee.  Lying on your back, tighten up the muscles of your buttocks both  with the legs straight and with the knee bent at a comfortable angle while keeping your heel on the floor.      IF YOU ARE TRANSFERRED TO A SKILLED REHAB FACILITY If the patient is transferred to a skilled rehab facility following release from the hospital, a list of the current medications will be sent to the facility for the patient to continue.  When discharged from the skilled rehab facility, please have the facility set up the patient's [place] prior to being released. Also, the skilled facility will be responsible for providing the patient with their medications at time of release from the facility to include their pain medication, the muscle relaxants, and their blood thinner medication. If the patient is still at the rehab facility at time of the two week follow up appointment, the skilled rehab facility will also need to assist the patient in arranging follow up appointment in our office and any transportation needs.  MAKE SURE YOU:  Understand these instructions.  Get help right away if you are not doing well or get worse.    Pick up stool softner and laxative for home use following surgery while on pain medications. Do not submerge incision under water. Please use good hand washing techniques while changing dressing each day. May shower starting three days after surgery. Please use a clean towel to pat the incision dry following showers. Continue to use ice for pain and swelling after surgery. Do not use any lotions or creams on the incision until instructed by your surgeon.  Take a 325 mg Aspirin twice a day for three weeks following discharge from the hospital, then reduce to a baby 81 mg Aspirin daily for three additional weeks.

## 2016-01-26 NOTE — Progress Notes (Signed)
Physical Therapy Treatment Patient Details Name: [name] MRN: [id number] DOB: 1925-06-03 Today's Date: 01/26/2016    History of Present Illness Pt is an 82 year old female s/p fall at home resulting in right femoral neck fracture and odontoid fracture.  Pt currently s/p R hip hemiarthroplasty with posterior hip precautions and has soft c-collar for odontoid fracture    PT Comments    Pt mobilizing well and recalling precautions during mobility well. Pt performed steps and states her husband can physically assist.  Initially requested spouse be present however possible communication mix up and spouse not available this morning.  Pt stated she did not feel he needed to be present and was ready to ambulate and practice steps.  Pt encouraged to have another person assist her with steps at home if spouse was not physically able however pt reports spouse is fully capable of assist.  Pt feels comfortable with d/c home later today.   Follow Up Recommendations  Home health PT;Supervision/Assistance - 24 hour     Equipment Recommendations       Recommendations for Other Services       Precautions / Restrictions Precautions Precautions: Cervical;Posterior Hip Precaution Comments: posterior hip precautions reviewed  Required Braces or Orthoses: Cervical Brace Cervical Brace: Soft collar Restrictions Other Position/Activity Restrictions: WBAT    Mobility  Bed Mobility         Supine to sit: Min assist     General bed mobility comments: pt up in recliner on arrival  Transfers Overall transfer level: Needs assistance Equipment used: Rolling walker (2 wheeled) Transfers: Sit to/from Stand Sit to Stand: Min guard Stand pivot transfers: Min guard       General transfer comment: slow transfers however performs with safe technique  Ambulation/Gait Ambulation/Gait assistance: Min guard Ambulation Distance (Feet): 80 Feet Assistive device: Rolling walker (2 wheeled) Gait  Pattern/deviations: Step-to pattern;Antalgic     General Gait Details: mobilizing well with RW, cues to turn toward unaffected LE   Stairs Stairs: Yes Stairs assistance: Min guard Stair Management: Step to pattern;Forwards;One rail Right Number of Stairs: 2 General stair comments: verbal cues for safety and technique, pt with rail on right side and states her husband can assist on left side so provided HHA for left, pt performed twice, able to verbally recall correct sequence upon asking when returned to room  Wheelchair Mobility    Modified Rankin (Stroke Patients Only)       Balance                                    Cognition Arousal/Alertness: Awake/alert Behavior During Therapy: WFL for tasks assessed/performed Overall Cognitive Status: Within Functional Limits for tasks assessed                      Exercises      General Comments        Pertinent Vitals/Pain Pain Assessment: 0-10 Pain Score: 7  Pain Location: R hip Pain Descriptors / Indicators: Aching;Sore Pain Intervention(s): Limited activity within patient's tolerance;Premedicated before session;Repositioned;Ice applied;Monitored during session    Home Living                      Prior Function            PT Goals (current goals can now be found in the care plan section) Progress towards PT goals: Progressing  toward goals    Frequency  Min 5X/week    PT Plan Current plan remains appropriate    Co-evaluation             End of Session Equipment Utilized During Treatment: Cervical collar;Gait belt Activity Tolerance: Patient tolerated treatment well Patient left: in chair;with call bell/phone within reach;with chair alarm set     Time: [id number] PT Time Calculation (min) (ACUTE ONLY): 13 min  Charges:  $Gait Training: 8-22 mins                    G Codes:      [place] Coco,KATHrine E 02/20/2016, 1:00 PM Carmelia Bake, PT, DPT 2016/02/20 Pager:  [phone]

## 2016-01-27 DIAGNOSIS — S72011D Unspecified intracapsular fracture of right femur, subsequent encounter for closed fracture with routine healing: Secondary | ICD-10-CM | POA: Diagnosis not present

## 2016-01-27 DIAGNOSIS — S12000D Unspecified displaced fracture of first cervical vertebra, subsequent encounter for fracture with routine healing: Secondary | ICD-10-CM | POA: Diagnosis not present

## 2016-01-27 DIAGNOSIS — W19XXXD Unspecified fall, subsequent encounter: Secondary | ICD-10-CM | POA: Diagnosis not present

## 2016-01-27 DIAGNOSIS — S12100D Unspecified displaced fracture of second cervical vertebra, subsequent encounter for fracture with routine healing: Secondary | ICD-10-CM | POA: Diagnosis not present

## 2016-01-27 DIAGNOSIS — I10 Essential (primary) hypertension: Secondary | ICD-10-CM | POA: Diagnosis not present

## 2016-01-27 DIAGNOSIS — M1991 Primary osteoarthritis, unspecified site: Secondary | ICD-10-CM | POA: Diagnosis not present

## 2016-01-28 DIAGNOSIS — M1991 Primary osteoarthritis, unspecified site: Secondary | ICD-10-CM | POA: Diagnosis not present

## 2016-01-28 DIAGNOSIS — I10 Essential (primary) hypertension: Secondary | ICD-10-CM | POA: Diagnosis not present

## 2016-01-28 DIAGNOSIS — S12000D Unspecified displaced fracture of first cervical vertebra, subsequent encounter for fracture with routine healing: Secondary | ICD-10-CM | POA: Diagnosis not present

## 2016-01-28 DIAGNOSIS — W19XXXD Unspecified fall, subsequent encounter: Secondary | ICD-10-CM | POA: Diagnosis not present

## 2016-01-28 DIAGNOSIS — S12100D Unspecified displaced fracture of second cervical vertebra, subsequent encounter for fracture with routine healing: Secondary | ICD-10-CM | POA: Diagnosis not present

## 2016-01-28 DIAGNOSIS — S72011D Unspecified intracapsular fracture of right femur, subsequent encounter for closed fracture with routine healing: Secondary | ICD-10-CM | POA: Diagnosis not present

## 2016-01-31 DIAGNOSIS — I10 Essential (primary) hypertension: Secondary | ICD-10-CM | POA: Diagnosis not present

## 2016-01-31 DIAGNOSIS — M1991 Primary osteoarthritis, unspecified site: Secondary | ICD-10-CM | POA: Diagnosis not present

## 2016-01-31 DIAGNOSIS — S12000D Unspecified displaced fracture of first cervical vertebra, subsequent encounter for fracture with routine healing: Secondary | ICD-10-CM | POA: Diagnosis not present

## 2016-01-31 DIAGNOSIS — S12100D Unspecified displaced fracture of second cervical vertebra, subsequent encounter for fracture with routine healing: Secondary | ICD-10-CM | POA: Diagnosis not present

## 2016-01-31 DIAGNOSIS — S72011D Unspecified intracapsular fracture of right femur, subsequent encounter for closed fracture with routine healing: Secondary | ICD-10-CM | POA: Diagnosis not present

## 2016-01-31 DIAGNOSIS — W19XXXD Unspecified fall, subsequent encounter: Secondary | ICD-10-CM | POA: Diagnosis not present

## 2016-02-03 DIAGNOSIS — M1991 Primary osteoarthritis, unspecified site: Secondary | ICD-10-CM | POA: Diagnosis not present

## 2016-02-03 DIAGNOSIS — W19XXXD Unspecified fall, subsequent encounter: Secondary | ICD-10-CM | POA: Diagnosis not present

## 2016-02-03 DIAGNOSIS — I10 Essential (primary) hypertension: Secondary | ICD-10-CM | POA: Diagnosis not present

## 2016-02-03 DIAGNOSIS — S72011D Unspecified intracapsular fracture of right femur, subsequent encounter for closed fracture with routine healing: Secondary | ICD-10-CM | POA: Diagnosis not present

## 2016-02-03 DIAGNOSIS — S12000D Unspecified displaced fracture of first cervical vertebra, subsequent encounter for fracture with routine healing: Secondary | ICD-10-CM | POA: Diagnosis not present

## 2016-02-03 DIAGNOSIS — S12100D Unspecified displaced fracture of second cervical vertebra, subsequent encounter for fracture with routine healing: Secondary | ICD-10-CM | POA: Diagnosis not present

## 2016-02-06 DIAGNOSIS — S72011D Unspecified intracapsular fracture of right femur, subsequent encounter for closed fracture with routine healing: Secondary | ICD-10-CM | POA: Diagnosis not present

## 2016-02-06 DIAGNOSIS — I10 Essential (primary) hypertension: Secondary | ICD-10-CM | POA: Diagnosis not present

## 2016-02-06 DIAGNOSIS — W19XXXD Unspecified fall, subsequent encounter: Secondary | ICD-10-CM | POA: Diagnosis not present

## 2016-02-06 DIAGNOSIS — S12100D Unspecified displaced fracture of second cervical vertebra, subsequent encounter for fracture with routine healing: Secondary | ICD-10-CM | POA: Diagnosis not present

## 2016-02-06 DIAGNOSIS — M1991 Primary osteoarthritis, unspecified site: Secondary | ICD-10-CM | POA: Diagnosis not present

## 2016-02-06 DIAGNOSIS — S12000D Unspecified displaced fracture of first cervical vertebra, subsequent encounter for fracture with routine healing: Secondary | ICD-10-CM | POA: Diagnosis not present

## 2016-02-07 DIAGNOSIS — S72041D Displaced fracture of base of neck of right femur, subsequent encounter for closed fracture with routine healing: Secondary | ICD-10-CM | POA: Diagnosis not present

## 2016-02-10 DIAGNOSIS — M1991 Primary osteoarthritis, unspecified site: Secondary | ICD-10-CM | POA: Diagnosis not present

## 2016-02-10 DIAGNOSIS — S12100D Unspecified displaced fracture of second cervical vertebra, subsequent encounter for fracture with routine healing: Secondary | ICD-10-CM | POA: Diagnosis not present

## 2016-02-10 DIAGNOSIS — W19XXXD Unspecified fall, subsequent encounter: Secondary | ICD-10-CM | POA: Diagnosis not present

## 2016-02-10 DIAGNOSIS — I10 Essential (primary) hypertension: Secondary | ICD-10-CM | POA: Diagnosis not present

## 2016-02-10 DIAGNOSIS — S12000D Unspecified displaced fracture of first cervical vertebra, subsequent encounter for fracture with routine healing: Secondary | ICD-10-CM | POA: Diagnosis not present

## 2016-02-10 DIAGNOSIS — S72011D Unspecified intracapsular fracture of right femur, subsequent encounter for closed fracture with routine healing: Secondary | ICD-10-CM | POA: Diagnosis not present

## 2016-02-13 DIAGNOSIS — S12000D Unspecified displaced fracture of first cervical vertebra, subsequent encounter for fracture with routine healing: Secondary | ICD-10-CM | POA: Diagnosis not present

## 2016-02-13 DIAGNOSIS — W19XXXD Unspecified fall, subsequent encounter: Secondary | ICD-10-CM | POA: Diagnosis not present

## 2016-02-13 DIAGNOSIS — M1991 Primary osteoarthritis, unspecified site: Secondary | ICD-10-CM | POA: Diagnosis not present

## 2016-02-13 DIAGNOSIS — I10 Essential (primary) hypertension: Secondary | ICD-10-CM | POA: Diagnosis not present

## 2016-02-13 DIAGNOSIS — S72011D Unspecified intracapsular fracture of right femur, subsequent encounter for closed fracture with routine healing: Secondary | ICD-10-CM | POA: Diagnosis not present

## 2016-02-13 DIAGNOSIS — S12100D Unspecified displaced fracture of second cervical vertebra, subsequent encounter for fracture with routine healing: Secondary | ICD-10-CM | POA: Diagnosis not present

## 2016-02-17 DIAGNOSIS — S72011D Unspecified intracapsular fracture of right femur, subsequent encounter for closed fracture with routine healing: Secondary | ICD-10-CM | POA: Diagnosis not present

## 2016-02-17 DIAGNOSIS — M1991 Primary osteoarthritis, unspecified site: Secondary | ICD-10-CM | POA: Diagnosis not present

## 2016-02-17 DIAGNOSIS — S12100D Unspecified displaced fracture of second cervical vertebra, subsequent encounter for fracture with routine healing: Secondary | ICD-10-CM | POA: Diagnosis not present

## 2016-02-17 DIAGNOSIS — S12000D Unspecified displaced fracture of first cervical vertebra, subsequent encounter for fracture with routine healing: Secondary | ICD-10-CM | POA: Diagnosis not present

## 2016-02-17 DIAGNOSIS — I10 Essential (primary) hypertension: Secondary | ICD-10-CM | POA: Diagnosis not present

## 2016-02-17 DIAGNOSIS — W19XXXD Unspecified fall, subsequent encounter: Secondary | ICD-10-CM | POA: Diagnosis not present

## 2016-02-21 DIAGNOSIS — S12112A Nondisplaced Type II dens fracture, initial encounter for closed fracture: Secondary | ICD-10-CM | POA: Diagnosis not present

## 2016-02-27 DIAGNOSIS — M1991 Primary osteoarthritis, unspecified site: Secondary | ICD-10-CM | POA: Diagnosis not present

## 2016-02-27 DIAGNOSIS — S12000D Unspecified displaced fracture of first cervical vertebra, subsequent encounter for fracture with routine healing: Secondary | ICD-10-CM | POA: Diagnosis not present

## 2016-02-27 DIAGNOSIS — W19XXXD Unspecified fall, subsequent encounter: Secondary | ICD-10-CM | POA: Diagnosis not present

## 2016-02-27 DIAGNOSIS — I10 Essential (primary) hypertension: Secondary | ICD-10-CM | POA: Diagnosis not present

## 2016-02-27 DIAGNOSIS — S72011D Unspecified intracapsular fracture of right femur, subsequent encounter for closed fracture with routine healing: Secondary | ICD-10-CM | POA: Diagnosis not present

## 2016-02-27 DIAGNOSIS — S12100D Unspecified displaced fracture of second cervical vertebra, subsequent encounter for fracture with routine healing: Secondary | ICD-10-CM | POA: Diagnosis not present

## 2016-02-28 DIAGNOSIS — Z4789 Encounter for other orthopedic aftercare: Secondary | ICD-10-CM | POA: Diagnosis not present

## 2016-03-02 DIAGNOSIS — I10 Essential (primary) hypertension: Secondary | ICD-10-CM | POA: Diagnosis not present

## 2016-03-02 DIAGNOSIS — S12000D Unspecified displaced fracture of first cervical vertebra, subsequent encounter for fracture with routine healing: Secondary | ICD-10-CM | POA: Diagnosis not present

## 2016-03-02 DIAGNOSIS — M1991 Primary osteoarthritis, unspecified site: Secondary | ICD-10-CM | POA: Diagnosis not present

## 2016-03-02 DIAGNOSIS — S72011D Unspecified intracapsular fracture of right femur, subsequent encounter for closed fracture with routine healing: Secondary | ICD-10-CM | POA: Diagnosis not present

## 2016-03-02 DIAGNOSIS — S12100D Unspecified displaced fracture of second cervical vertebra, subsequent encounter for fracture with routine healing: Secondary | ICD-10-CM | POA: Diagnosis not present

## 2016-03-02 DIAGNOSIS — W19XXXD Unspecified fall, subsequent encounter: Secondary | ICD-10-CM | POA: Diagnosis not present

## 2016-03-16 DIAGNOSIS — M5416 Radiculopathy, lumbar region: Secondary | ICD-10-CM | POA: Diagnosis not present

## 2016-03-16 DIAGNOSIS — M5137 Other intervertebral disc degeneration, lumbosacral region: Secondary | ICD-10-CM | POA: Diagnosis not present

## 2016-03-16 DIAGNOSIS — M961 Postlaminectomy syndrome, not elsewhere classified: Secondary | ICD-10-CM | POA: Diagnosis not present

## 2016-03-19 DIAGNOSIS — S12112A Nondisplaced Type II dens fracture, initial encounter for closed fracture: Secondary | ICD-10-CM | POA: Diagnosis not present

## 2016-03-20 DIAGNOSIS — Z6824 Body mass index (BMI) 24.0-24.9, adult: Secondary | ICD-10-CM | POA: Diagnosis not present

## 2016-03-20 DIAGNOSIS — R600 Localized edema: Secondary | ICD-10-CM | POA: Diagnosis not present

## 2016-03-20 DIAGNOSIS — F419 Anxiety disorder, unspecified: Secondary | ICD-10-CM | POA: Diagnosis not present

## 2016-03-21 ENCOUNTER — Other Ambulatory Visit: Payer: Self-pay | Admitting: Neurological Surgery

## 2016-03-21 DIAGNOSIS — S12112A Nondisplaced Type II dens fracture, initial encounter for closed fracture: Secondary | ICD-10-CM

## 2016-03-29 ENCOUNTER — Ambulatory Visit
Admission: RE | Admit: 2016-03-29 | Discharge: 2016-03-29 | Disposition: A | Discharge status: Home / Self Care | Payer: Medicare Other | Source: Ambulatory Visit | Attending: Neurological Surgery | Admitting: Neurological Surgery

## 2016-03-29 DIAGNOSIS — S12001D Unspecified nondisplaced fracture of first cervical vertebra, subsequent encounter for fracture with routine healing: Secondary | ICD-10-CM | POA: Diagnosis not present

## 2016-03-29 DIAGNOSIS — S12112A Nondisplaced Type II dens fracture, initial encounter for closed fracture: Secondary | ICD-10-CM

## 2016-03-29 DIAGNOSIS — S12101D Unspecified nondisplaced fracture of second cervical vertebra, subsequent encounter for fracture with routine healing: Secondary | ICD-10-CM | POA: Diagnosis not present

## 2016-04-05 DIAGNOSIS — M549 Dorsalgia, unspecified: Secondary | ICD-10-CM | POA: Diagnosis not present

## 2016-04-05 DIAGNOSIS — Z6826 Body mass index (BMI) 26.0-26.9, adult: Secondary | ICD-10-CM | POA: Diagnosis not present

## 2016-04-05 DIAGNOSIS — I10 Essential (primary) hypertension: Secondary | ICD-10-CM | POA: Diagnosis not present

## 2016-04-05 DIAGNOSIS — R600 Localized edema: Secondary | ICD-10-CM | POA: Diagnosis not present

## 2016-04-05 DIAGNOSIS — F419 Anxiety disorder, unspecified: Secondary | ICD-10-CM | POA: Diagnosis not present

## 2016-04-12 DIAGNOSIS — M5481 Occipital neuralgia: Secondary | ICD-10-CM | POA: Diagnosis not present

## 2016-04-19 DIAGNOSIS — S72041D Displaced fracture of base of neck of right femur, subsequent encounter for closed fracture with routine healing: Secondary | ICD-10-CM | POA: Diagnosis not present

## 2016-04-19 DIAGNOSIS — Z4789 Encounter for other orthopedic aftercare: Secondary | ICD-10-CM | POA: Diagnosis not present

## 2016-04-24 ENCOUNTER — Other Ambulatory Visit: Payer: Self-pay | Admitting: Neurological Surgery

## 2016-04-24 DIAGNOSIS — S12112A Nondisplaced Type II dens fracture, initial encounter for closed fracture: Secondary | ICD-10-CM

## 2016-04-25 ENCOUNTER — Ambulatory Visit
Admission: RE | Admit: 2016-04-25 | Discharge: 2016-04-25 | Disposition: A | Discharge status: Home / Self Care | Payer: Medicare Other | Source: Ambulatory Visit | Attending: Neurological Surgery | Admitting: Neurological Surgery

## 2016-04-25 DIAGNOSIS — S12112A Nondisplaced Type II dens fracture, initial encounter for closed fracture: Secondary | ICD-10-CM | POA: Diagnosis not present

## 2016-04-30 DIAGNOSIS — S12112A Nondisplaced Type II dens fracture, initial encounter for closed fracture: Secondary | ICD-10-CM | POA: Diagnosis not present

## 2016-05-01 DIAGNOSIS — I10 Essential (primary) hypertension: Secondary | ICD-10-CM | POA: Diagnosis not present

## 2016-05-01 DIAGNOSIS — R8290 Unspecified abnormal findings in urine: Secondary | ICD-10-CM | POA: Diagnosis not present

## 2016-05-01 DIAGNOSIS — N390 Urinary tract infection, site not specified: Secondary | ICD-10-CM | POA: Diagnosis not present

## 2016-05-01 DIAGNOSIS — E038 Other specified hypothyroidism: Secondary | ICD-10-CM | POA: Diagnosis not present

## 2016-05-11 DIAGNOSIS — M5416 Radiculopathy, lumbar region: Secondary | ICD-10-CM | POA: Diagnosis not present

## 2016-05-11 DIAGNOSIS — M5137 Other intervertebral disc degeneration, lumbosacral region: Secondary | ICD-10-CM | POA: Diagnosis not present

## 2016-05-11 DIAGNOSIS — M961 Postlaminectomy syndrome, not elsewhere classified: Secondary | ICD-10-CM | POA: Diagnosis not present

## 2016-05-11 DIAGNOSIS — M5481 Occipital neuralgia: Secondary | ICD-10-CM | POA: Diagnosis not present

## 2016-05-14 DIAGNOSIS — D649 Anemia, unspecified: Secondary | ICD-10-CM | POA: Diagnosis not present

## 2016-05-14 DIAGNOSIS — I10 Essential (primary) hypertension: Secondary | ICD-10-CM | POA: Diagnosis not present

## 2016-05-14 DIAGNOSIS — Z0000 Encounter for general adult medical examination without abnormal findings: Secondary | ICD-10-CM | POA: Diagnosis not present

## 2016-05-14 DIAGNOSIS — E038 Other specified hypothyroidism: Secondary | ICD-10-CM | POA: Diagnosis not present

## 2016-05-14 DIAGNOSIS — D638 Anemia in other chronic diseases classified elsewhere: Secondary | ICD-10-CM | POA: Diagnosis not present

## 2016-05-14 DIAGNOSIS — Z6827 Body mass index (BMI) 27.0-27.9, adult: Secondary | ICD-10-CM | POA: Diagnosis not present

## 2016-05-14 DIAGNOSIS — R600 Localized edema: Secondary | ICD-10-CM | POA: Diagnosis not present

## 2016-05-14 DIAGNOSIS — M4306 Spondylolysis, lumbar region: Secondary | ICD-10-CM | POA: Diagnosis not present

## 2016-05-16 DIAGNOSIS — S12112A Nondisplaced Type II dens fracture, initial encounter for closed fracture: Secondary | ICD-10-CM | POA: Diagnosis not present

## 2016-05-16 DIAGNOSIS — S12090A Other displaced fracture of first cervical vertebra, initial encounter for closed fracture: Secondary | ICD-10-CM | POA: Diagnosis not present

## 2016-05-28 DIAGNOSIS — M47812 Spondylosis without myelopathy or radiculopathy, cervical region: Secondary | ICD-10-CM | POA: Diagnosis not present

## 2016-06-04 DIAGNOSIS — M47812 Spondylosis without myelopathy or radiculopathy, cervical region: Secondary | ICD-10-CM | POA: Diagnosis not present

## 2016-06-04 DIAGNOSIS — R030 Elevated blood-pressure reading, without diagnosis of hypertension: Secondary | ICD-10-CM | POA: Diagnosis not present

## 2016-06-04 DIAGNOSIS — Z6827 Body mass index (BMI) 27.0-27.9, adult: Secondary | ICD-10-CM | POA: Diagnosis not present

## 2016-06-04 DIAGNOSIS — S12112A Nondisplaced Type II dens fracture, initial encounter for closed fracture: Secondary | ICD-10-CM | POA: Diagnosis not present

## 2016-06-06 DIAGNOSIS — Z6826 Body mass index (BMI) 26.0-26.9, adult: Secondary | ICD-10-CM | POA: Diagnosis not present

## 2016-06-06 DIAGNOSIS — R42 Dizziness and giddiness: Secondary | ICD-10-CM | POA: Diagnosis not present

## 2016-06-06 DIAGNOSIS — I10 Essential (primary) hypertension: Secondary | ICD-10-CM | POA: Diagnosis not present

## 2016-06-06 DIAGNOSIS — G44309 Post-traumatic headache, unspecified, not intractable: Secondary | ICD-10-CM | POA: Diagnosis not present

## 2016-06-15 DIAGNOSIS — G44309 Post-traumatic headache, unspecified, not intractable: Secondary | ICD-10-CM | POA: Diagnosis not present

## 2016-06-15 DIAGNOSIS — M6281 Muscle weakness (generalized): Secondary | ICD-10-CM | POA: Diagnosis not present

## 2016-06-15 DIAGNOSIS — M5412 Radiculopathy, cervical region: Secondary | ICD-10-CM | POA: Diagnosis not present

## 2016-06-15 DIAGNOSIS — Z6825 Body mass index (BMI) 25.0-25.9, adult: Secondary | ICD-10-CM | POA: Diagnosis not present

## 2016-06-19 ENCOUNTER — Inpatient Hospital Stay (HOSPITAL_COMMUNITY)
Admission: EM | Admit: 2016-06-19 | Discharge: 2016-06-23 | DRG: 552 | Disposition: A | Discharge status: Home Health | Payer: Medicare Other | Attending: Family Medicine | Admitting: Family Medicine

## 2016-06-19 ENCOUNTER — Emergency Department (HOSPITAL_COMMUNITY): Payer: Medicare Other

## 2016-06-19 DIAGNOSIS — L89152 Pressure ulcer of sacral region, stage 2: Secondary | ICD-10-CM | POA: Diagnosis not present

## 2016-06-19 DIAGNOSIS — R269 Unspecified abnormalities of gait and mobility: Secondary | ICD-10-CM | POA: Diagnosis not present

## 2016-06-19 DIAGNOSIS — Z881 Allergy status to other antibiotic agents status: Secondary | ICD-10-CM | POA: Diagnosis not present

## 2016-06-19 DIAGNOSIS — R531 Weakness: Secondary | ICD-10-CM

## 2016-06-19 DIAGNOSIS — E039 Hypothyroidism, unspecified: Secondary | ICD-10-CM | POA: Diagnosis present

## 2016-06-19 DIAGNOSIS — M545 Low back pain: Secondary | ICD-10-CM | POA: Diagnosis present

## 2016-06-19 DIAGNOSIS — D649 Anemia, unspecified: Secondary | ICD-10-CM | POA: Diagnosis present

## 2016-06-19 DIAGNOSIS — L8990 Pressure ulcer of unspecified site, unspecified stage: Secondary | ICD-10-CM | POA: Insufficient documentation

## 2016-06-19 DIAGNOSIS — D638 Anemia in other chronic diseases classified elsewhere: Secondary | ICD-10-CM | POA: Diagnosis not present

## 2016-06-19 DIAGNOSIS — N3000 Acute cystitis without hematuria: Secondary | ICD-10-CM | POA: Diagnosis not present

## 2016-06-19 DIAGNOSIS — I6789 Other cerebrovascular disease: Secondary | ICD-10-CM | POA: Diagnosis not present

## 2016-06-19 DIAGNOSIS — R51 Headache: Secondary | ICD-10-CM | POA: Diagnosis not present

## 2016-06-19 DIAGNOSIS — Z79891 Long term (current) use of opiate analgesic: Secondary | ICD-10-CM

## 2016-06-19 DIAGNOSIS — M4802 Spinal stenosis, cervical region: Principal | ICD-10-CM | POA: Diagnosis present

## 2016-06-19 DIAGNOSIS — Z961 Presence of intraocular lens: Secondary | ICD-10-CM | POA: Diagnosis present

## 2016-06-19 DIAGNOSIS — B961 Klebsiella pneumoniae [K. pneumoniae] as the cause of diseases classified elsewhere: Secondary | ICD-10-CM | POA: Diagnosis present

## 2016-06-19 DIAGNOSIS — I10 Essential (primary) hypertension: Secondary | ICD-10-CM | POA: Diagnosis present

## 2016-06-19 DIAGNOSIS — E785 Hyperlipidemia, unspecified: Secondary | ICD-10-CM | POA: Diagnosis present

## 2016-06-19 DIAGNOSIS — M542 Cervicalgia: Secondary | ICD-10-CM | POA: Diagnosis not present

## 2016-06-19 DIAGNOSIS — B9620 Unspecified Escherichia coli [E. coli] as the cause of diseases classified elsewhere: Secondary | ICD-10-CM | POA: Diagnosis present

## 2016-06-19 DIAGNOSIS — E440 Moderate protein-calorie malnutrition: Secondary | ICD-10-CM | POA: Diagnosis present

## 2016-06-19 DIAGNOSIS — Z888 Allergy status to other drugs, medicaments and biological substances status: Secondary | ICD-10-CM

## 2016-06-19 DIAGNOSIS — Z880 Allergy status to penicillin: Secondary | ICD-10-CM

## 2016-06-19 DIAGNOSIS — G8929 Other chronic pain: Secondary | ICD-10-CM | POA: Diagnosis present

## 2016-06-19 DIAGNOSIS — N390 Urinary tract infection, site not specified: Secondary | ICD-10-CM | POA: Diagnosis present

## 2016-06-19 DIAGNOSIS — E890 Postprocedural hypothyroidism: Secondary | ICD-10-CM | POA: Diagnosis present

## 2016-06-19 DIAGNOSIS — Z823 Family history of stroke: Secondary | ICD-10-CM

## 2016-06-19 DIAGNOSIS — S12001D Unspecified nondisplaced fracture of first cervical vertebra, subsequent encounter for fracture with routine healing: Secondary | ICD-10-CM

## 2016-06-19 DIAGNOSIS — Z885 Allergy status to narcotic agent status: Secondary | ICD-10-CM

## 2016-06-19 DIAGNOSIS — H409 Unspecified glaucoma: Secondary | ICD-10-CM | POA: Diagnosis present

## 2016-06-19 DIAGNOSIS — Z85850 Personal history of malignant neoplasm of thyroid: Secondary | ICD-10-CM

## 2016-06-19 DIAGNOSIS — R410 Disorientation, unspecified: Secondary | ICD-10-CM

## 2016-06-19 DIAGNOSIS — Z9842 Cataract extraction status, left eye: Secondary | ICD-10-CM

## 2016-06-19 DIAGNOSIS — K219 Gastro-esophageal reflux disease without esophagitis: Secondary | ICD-10-CM | POA: Diagnosis present

## 2016-06-19 DIAGNOSIS — Z820 Family history of epilepsy and other diseases of the nervous system: Secondary | ICD-10-CM

## 2016-06-19 DIAGNOSIS — M5412 Radiculopathy, cervical region: Secondary | ICD-10-CM | POA: Diagnosis present

## 2016-06-19 DIAGNOSIS — Z9841 Cataract extraction status, right eye: Secondary | ICD-10-CM

## 2016-06-19 DIAGNOSIS — S12110A Anterior displaced Type II dens fracture, initial encounter for closed fracture: Secondary | ICD-10-CM | POA: Diagnosis present

## 2016-06-19 DIAGNOSIS — R29898 Other symptoms and signs involving the musculoskeletal system: Secondary | ICD-10-CM | POA: Diagnosis present

## 2016-06-19 LAB — CBC WITH DIFFERENTIAL/PLATELET
Basophils Absolute: 0 10*3/uL (ref 0.0–0.1)
Basophils Relative: 0 %
EOS ABS: 0.1 10*3/uL (ref 0.0–0.7)
EOS PCT: 1 %
HCT: 32.6 % — ABNORMAL LOW (ref 36.0–46.0)
HEMOGLOBIN: 10.6 g/dL — AB (ref 12.0–15.0)
LYMPHS ABS: 0.6 10*3/uL — AB (ref 0.7–4.0)
LYMPHS PCT: 12 %
MCH: 30.6 pg (ref 26.0–34.0)
MCHC: 32.5 g/dL (ref 30.0–36.0)
MCV: 94.2 fL (ref 78.0–100.0)
MONOS PCT: 10 %
Monocytes Absolute: 0.5 10*3/uL (ref 0.1–1.0)
NEUTROS PCT: 77 %
Neutro Abs: 3.8 10*3/uL (ref 1.7–7.7)
Platelets: 227 10*3/uL (ref 150–400)
RBC: 3.46 MIL/uL — AB (ref 3.87–5.11)
RDW: 13.6 % (ref 11.5–15.5)
WBC: 5.1 10*3/uL (ref 4.0–10.5)

## 2016-06-19 LAB — COMPREHENSIVE METABOLIC PANEL
ALK PHOS: 74 U/L (ref 38–126)
ALT: 12 U/L — AB (ref 14–54)
ANION GAP: 9 (ref 5–15)
AST: 16 U/L (ref 15–41)
Albumin: 3.7 g/dL (ref 3.5–5.0)
BUN: 16 mg/dL (ref 6–20)
CALCIUM: 8.7 mg/dL — AB (ref 8.9–10.3)
CO2: 25 mmol/L (ref 22–32)
CREATININE: 0.8 mg/dL (ref 0.44–1.00)
Chloride: 102 mmol/L (ref 101–111)
Glucose, Bld: 95 mg/dL (ref 65–99)
Potassium: 4.2 mmol/L (ref 3.5–5.1)
Sodium: 136 mmol/L (ref 135–145)
TOTAL PROTEIN: 5.8 g/dL — AB (ref 6.5–8.1)
Total Bilirubin: 0.8 mg/dL (ref 0.3–1.2)

## 2016-06-19 LAB — URINALYSIS, ROUTINE W REFLEX MICROSCOPIC
BILIRUBIN URINE: NEGATIVE
Glucose, UA: NEGATIVE mg/dL
KETONES UR: NEGATIVE mg/dL
NITRITE: NEGATIVE
PH: 6.5 (ref 5.0–8.0)
PROTEIN: NEGATIVE mg/dL
Specific Gravity, Urine: 1.012 (ref 1.005–1.030)

## 2016-06-19 LAB — URINE MICROSCOPIC-ADD ON

## 2016-06-19 LAB — CK: CK TOTAL: 88 U/L (ref 38–234)

## 2016-06-19 MED ORDER — DIPHENHYDRAMINE HCL 50 MG/ML IJ SOLN
12.5000 mg | Freq: Once | INTRAMUSCULAR | Status: AC
  Administered 2016-06-19: 12.5 mg via INTRAVENOUS
  Filled 2016-06-19: qty 1

## 2016-06-19 MED ORDER — SODIUM CHLORIDE 0.9 % IV BOLUS (SEPSIS)
1000.0000 mL | Freq: Once | INTRAVENOUS | Status: AC
  Administered 2016-06-19: 1000 mL via INTRAVENOUS

## 2016-06-19 MED ORDER — CIPROFLOXACIN IN D5W 400 MG/200ML IV SOLN
400.0000 mg | Freq: Once | INTRAVENOUS | Status: AC
  Administered 2016-06-19: 400 mg via INTRAVENOUS
  Filled 2016-06-19: qty 200

## 2016-06-19 MED ORDER — METOCLOPRAMIDE HCL 5 MG/ML IJ SOLN
5.0000 mg | Freq: Once | INTRAMUSCULAR | Status: AC
  Administered 2016-06-19: 5 mg via INTRAVENOUS
  Filled 2016-06-19: qty 2

## 2016-06-19 MED ORDER — FENTANYL CITRATE (PF) 100 MCG/2ML IJ SOLN
25.0000 ug | Freq: Once | INTRAMUSCULAR | Status: AC
  Administered 2016-06-19: 25 ug via INTRAVENOUS
  Filled 2016-06-19: qty 2

## 2016-06-19 NOTE — ED Triage Notes (Signed)
Patient to ED with C/O bilateral weakness that has worsened in the last week. Also C/O having a right sided  headache that has been ongoing for 4 months. Patient is from home.

## 2016-06-19 NOTE — ED Provider Notes (Signed)
[facility] DEPT Provider Note   CSN: [id number] Arrival date & time: 06/19/16  1926     History   Chief Complaint Chief Complaint  Patient presents with  . Weakness    HPI [name] is a 82 y.o. female.  HPI 82 year old female with past medical history of hypertension, hyperlipidemia, hypothyroidism, history of previous C1 fracture who presents with generalized and primarily right-sided weakness. The patient states that over the last month, she has had progressively worsening right-sided upper and lower extremity weakness. It was initially just subjective weakness and she has been evaluated multiple times for this by her primary care doctor. Over the last week, however, her weakness has acutely worsened and she is no longer able to feed herself or hold objects in her right hand. She has also had generalized weakness of her bilateral but right greater than left legs. She is now having difficulty walking around the house. She denies any associated fevers. Denies any recent medication changes. She denies any falls or increase in her neck pain.   Past Medical History:  Diagnosis Date  . Anemia   . Angina   . Arthritis   . Blood transfusion   . Chronic back pain   . Exertional dyspnea 01/17/12  . Fracture 2008   back  . GERD (gastroesophageal reflux disease)   . Glaucoma   . H/O hiatal hernia   . Headache(784.0)   . Hypertension   . Hypoglycemia   . Hypothyroidism   . Macular degeneration of both eyes   . Metabolic acidemia 123456  . Osteomyelitis ([place]) 11/2011   right knee  . Pneumonia 01/2010  . Shortness of breath 11/08/11   "here lately, all the time"  . Thyroid cancer [facility]) 1986    Patient Active Problem List   Diagnosis Date Noted  . Postoperative anemia due to acute blood loss 01/26/2016  . GERD (gastroesophageal reflux disease) 01/21/2016  . Fracture of femoral neck, right, closed ([place]) 01/21/2016  . Closed displaced fracture of right femoral neck ([place])  01/21/2016  . Closed right hip fracture ([place]) 01/21/2016  . Fall   . Odontoid fracture ([place])   . Protein-calorie malnutrition, moderate ([place]) 02/19/2014  . Severe diarrhea 02/18/2014  . Metabolic acidosis 99991111  . Weight loss, unintentional 02/18/2014  . Rash of neck 02/18/2014  . Hypothyroidism 02/18/2014  . Chronic anticoagulation 02/18/2014  . Tonsillar exudate 02/18/2014  . Lumbar radiculopathy 12/28/2013  . Cancer ([place]) 12/28/2013  . DDD (degenerative disc disease), lumbosacral 12/28/2013  . Glaucoma 12/28/2013  . Herpes zona 12/28/2013  . Chronic ulcer of skin ([place]) 12/28/2013  . Arthropathia 11/02/2013  . Failed back syndrome of lumbar spine 11/02/2013  . Anemia of unknown etiology 01/21/2012  . Hypokalemia 01/19/2012  . Anemia due to blood loss, acute 01/19/2012  . Infection of prosthetic knee joint ([place]) 01/18/2012  . Drug rash 12/14/2011  . Prosthetic joint infection ([place]) 12/14/2011  . GBS (group B streptococcus) infection 12/14/2011  . Hx of thyroid cancer 12/14/2011  . Bilateral lower extremity edema 12/14/2011  . Hypertension 01/05/2011    Past Surgical History:  Procedure Laterality Date  . ABDOMINAL HYSTERECTOMY  1963  . APPENDECTOMY    . BACK SURGERY     "3 back OR's;"; S/P "fractured back 2008"  . CARPAL TUNNEL RELEASE     bilaterally  . CATARACT EXTRACTION W/ INTRAOCULAR LENS  IMPLANT, BILATERAL    . DILATION AND CURETTAGE OF UTERUS    . FRACTURE SURGERY     "  broke back"  . goiter removed  1980's   "when I was young"  . hand OR  09/2011   "left hand; bones were coming together"  . HIP ARTHROPLASTY Right 01/23/2016   Procedure: ARTHROPLASTY BIPOLAR HIP (HEMIARTHROPLASTY);  Surgeon: Gaynelle Arabian, MD;  Location: [facility];  Service: Orthopedics;  Laterality: Right;  Depuy  . I&D KNEE WITH POLY EXCHANGE  11/10/2011   Procedure: IRRIGATION AND DEBRIDEMENT KNEE WITH POLY EXCHANGE;  Surgeon: Yvette Rack., MD;  Location: [facility];  Service: Orthopedics;   Laterality: Right;  . JOINT REPLACEMENT  2005, 2006, 2012, 11/10/11   left; left; right; right; right  . PERIPHERALLY INSERTED CENTRAL CATHETER INSERTION  11/2011   "removed 12/2011; LUE"  . post polyethelene exchange  11/09/11   RLE  . SHOULDER ARTHROSCOPY W/ ROTATOR CUFF REPAIR    . THYROIDECTOMY  1986  . TONSILLECTOMY AND ADENOIDECTOMY    . TOTAL KNEE REVISION  04/28/2012   Procedure: TOTAL KNEE REVISION;  Surgeon: Rudean Haskell, MD;  Location: [place];  Service: Orthopedics;  Laterality: Right;  . TRIGGER FINGER RELEASE     right  thumb    OB History    No data available       Home Medications    Prior to Admission medications   Medication Sig Start Date End Date Taking? Authorizing Provider  brimonidine (ALPHAGAN) 0.2 % ophthalmic solution Place 1 drop into both eyes 2 (two) times daily. 01/12/16   Historical Provider, MD  dorzolamide-timolol (COSOPT) 22.3-6.8 MG/ML ophthalmic solution Place 1 drop into both eyes 2 (two) times daily. 01/12/16   Historical Provider, MD  enoxaparin (LOVENOX) 40 MG/0.4ML injection Inject 0.4 mLs (40 mg total) into the skin daily. Lovenox 40 mg subcutaneous daily for 1 month for DVT prophylaxis (s/p Rt Hip ORIF) 01/26/16   Roxan Hockey, MD  ferrous sulfate 325 (65 FE) MG EC tablet Take 1 tablet (325 mg total) by mouth 3 (three) times daily with meals. 01/26/16   Courage Denton Brick, MD  folic acid (FOLVITE) 1 MG tablet Take 1 tablet (1 mg total) by mouth daily. 01/26/16   Roxan Hockey, MD  gabapentin (NEURONTIN) 300 MG capsule Take 600 mg by mouth 3 (three) times daily.     Historical Provider, MD  HYDROcodone-acetaminophen (NORCO/VICODIN) 5-325 MG tablet Take 1 tablet by mouth every 4 (four) hours as needed for moderate pain. 01/26/16   Courage Denton Brick, MD  latanoprost (XALATAN) 0.005 % ophthalmic solution Place 1 drop into both eyes at bedtime.  01/05/13   Historical Provider, MD  levothyroxine (SYNTHROID, LEVOTHROID) 137 MCG tablet Take 1 tablet (137 mcg  total) by mouth daily before breakfast. 01/26/16   Roxan Hockey, MD  methocarbamol (ROBAXIN) 500 MG tablet Take 1 tablet (500 mg total) by mouth 3 (three) times daily. 01/26/16   Courage Denton Brick, MD  ondansetron (ZOFRAN) 4 MG tablet Take 4 mg by mouth 2 (two) times daily as needed for nausea or vomiting.    Historical Provider, MD  ondansetron (ZOFRAN) 4 MG tablet Take 1 tablet (4 mg total) by mouth every 8 (eight) hours as needed for nausea. 01/26/16   Roxan Hockey, MD  oxyCODONE (OXYCONTIN) 10 mg 12 hr tablet Take 1 tablet (10 mg total) by mouth daily. 01/26/16   Courage Denton Brick, MD  oxyCODONE (ROXICODONE) 15 MG immediate release tablet Take 15 mg by mouth every 4 (four) hours as needed for pain.  01/17/16   Historical Provider, MD  polyethylene glycol (MIRALAX / GLYCOLAX)  packet Take 17 g by mouth 2 (two) times daily. Patient taking differently: Take 17 g by mouth daily as needed for mild constipation or moderate constipation.  02/20/14   Shanker Kristeen Mans, MD  propranolol (INDERAL) 20 MG tablet Take 20 mg by mouth daily. Takes for migraines.    Historical Provider, MD  saccharomyces boulardii (FLORASTOR) 250 MG capsule Take 1 capsule (250 mg total) by mouth 2 (two) times daily. Patient not taking: Reported on 01/21/2016 02/20/14   Jonetta Osgood, MD  senna-docusate (SENOKOT-S) 8.6-50 MG tablet Take 2 tablets by mouth at bedtime. 01/26/16   Roxan Hockey, MD    Family History Family History  Problem Relation Age of Onset  . Intracerebral hemorrhage Mother   . Stroke Father   . Parkinson's disease Brother     Social History Social History  Substance Use Topics  . Smoking status: Never Smoker  . Smokeless tobacco: Never Used  . Alcohol use No     Allergies   Demerol; Penicillins; Corticosteroids; Lorazepam; Tetracyclines & related; and Vancomycin   Review of Systems Review of Systems  Constitutional: Positive for fatigue. Negative for chills and fever.  HENT: Negative for  congestion, rhinorrhea and sore throat.   Eyes: Negative for visual disturbance.  Respiratory: Negative for cough, shortness of breath and wheezing.   Cardiovascular: Negative for chest pain and leg swelling.  Gastrointestinal: Negative for abdominal pain, diarrhea, nausea and vomiting.  Genitourinary: Negative for dysuria, flank pain, vaginal bleeding and vaginal discharge.  Musculoskeletal: Positive for gait problem. Negative for neck pain.  Skin: Negative for rash.  Allergic/Immunologic: Negative for immunocompromised state.  Neurological: Positive for weakness. Negative for syncope and headaches.  Hematological: Does not bruise/bleed easily.  All other systems reviewed and are negative.    Physical Exam Updated Vital Signs BP (!) 203/74 (BP Location: Right Arm)   Pulse 94   Temp 98.8 F (37.1 C) (Oral)   Resp 21   Ht 5\' 1"  (1.549 m)   Wt 137 lb (62.1 kg)   SpO2 98%   BMI 25.89 kg/m   Physical Exam  Constitutional: She is oriented to person, place, and time. She appears well-developed and well-nourished. No distress.  HENT:  Head: Normocephalic and atraumatic.  Eyes: Conjunctivae are normal.  Neck: Neck supple.  Cardiovascular: Normal rate, regular rhythm and normal heart sounds.  Exam reveals no friction rub.   No murmur heard. Pulmonary/Chest: Effort normal and breath sounds normal. No respiratory distress. She has no wheezes. She has no rales.  Abdominal: Soft. She exhibits no distension. There is tenderness (Mild, suprapubic).  Musculoskeletal: She exhibits no edema.  Neurological: She is alert and oriented to person, place, and time. She exhibits normal muscle tone.  Skin: Skin is warm. Capillary refill takes less than 2 seconds.  Psychiatric: She has a normal mood and affect.  Nursing note and vitals reviewed.   Neurological Exam:  Mental Status: Alert and oriented to person, place, and time. Attention and concentration normal. Speech clear. Recent memory is  intact. Cranial Nerves: Visual fields intact to confrontation in all quadrants bilaterally. EOMI and PERRLA. No nystagmus noted. Facial sensation intact at forehead, maxillary cheek, and chin/mandible bilaterally. No weakness of masticatory muscles. No facial asymmetry or weakness. Hearing grossly normal to finer rub. Uvula is midline, and palate elevates symmetrically. Normal SCM and trapezius strength. Tongue midline without fasciculations Motor: Muscle strength 3 out of 5 and proximal and distal right upper and right lower extremity. 5 out of  5 throughout left upper and left lower extremity. Pronator drift on right. Muscle tone normal. Reflexes: 2+ and symmetrical in all four extremities.  Sensation: Intact to light touch in upper and lower extremities distally bilaterally.  Gait: Normal without ataxia. Coordination: Normal FTN bilaterally.      ED Treatments / Results  Labs (all labs ordered are listed, but only abnormal results are displayed) Labs Reviewed  CBC WITH DIFFERENTIAL/PLATELET  COMPREHENSIVE METABOLIC PANEL  URINALYSIS, ROUTINE W REFLEX MICROSCOPIC (NOT AT [facility])  CK    EKG  EKG Interpretation None       Radiology Ct Head Wo Contrast  Result Date: 06/19/2016 CLINICAL DATA:  Acute onset of right-sided headache, radiating down the right side of the neck. High blood pressure. Initial encounter. EXAM: CT HEAD WITHOUT CONTRAST CT CERVICAL SPINE WITHOUT CONTRAST TECHNIQUE: Multidetector CT imaging of the head and cervical spine was performed following the standard protocol without intravenous contrast. Multiplanar CT image reconstructions of the cervical spine were also generated. COMPARISON:  CT of the head performed 04/25/2016, and CT of the cervical spine performed 03/29/2016. MRI of the cervical spine performed 05/16/2016 FINDINGS: CT HEAD FINDINGS Brain: No evidence of acute infarction, hemorrhage, hydrocephalus, extra-axial collection or mass lesion/mass effect.  Prominence of the ventricles sulci reflects mild cortical volume loss. Scattered periventricular white matter change likely reflects small vessel ischemic microangiopathy. The brainstem and fourth ventricle are within normal limits. The basal ganglia are unremarkable in appearance. The cerebral hemispheres demonstrate grossly normal gray-white differentiation. No mass effect or midline shift is seen. Vascular: No hyperdense vessel or unexpected calcification. Skull: There is no evidence of fracture; visualized osseous structures are unremarkable in appearance. Sinuses/Orbits: The orbits are within normal limits. The paranasal sinuses and mastoid air cells are well-aerated. Other: No significant soft tissue abnormalities are seen. CT CERVICAL SPINE FINDINGS Alignment: Normal. Skull base and vertebrae: No acute fracture. No primary bone lesion or focal pathologic process. There is a chronic relatively stable fracture of the upper dens, grossly unchanged from the prior studies. Soft tissues and spinal canal: No prevertebral fluid or swelling. No visible canal hematoma. Chronic central canal stenosis at C1-C2 reflects the fracture described above. Disc levels: Multilevel disc space narrowing is noted, with loss of the disc spaces at C2-C4, reflecting some degree of osseous fusion. Scattered anterior and posterior disc osteophyte complexes are noted along the cervical and upper thoracic spine. Upper chest: Minimal scarring is noted at the lung apices. The patient is status post thyroidectomy. Other: No additional soft tissue abnormalities are seen. IMPRESSION: 1. No evidence of traumatic intracranial injury or fracture. 2. No evidence of fracture or subluxation along the cervical spine. 3. Mild cortical volume loss and scattered small vessel ischemic microangiopathy. 4. Diffuse degenerative change along the cervical spine, with chronic osseous fusion at C2-C4. 5. Chronic relatively stable fracture of the upper dens, with  underlying chronic central canal stenosis, better characterized on prior MRI. 6. Minimal scarring at the lung apices. Electronically Signed   By: Garald Balding M.D.   On: 06/19/2016 20:51   Ct Cervical Spine Wo Contrast  Result Date: 06/19/2016 CLINICAL DATA:  Acute onset of right-sided headache, radiating down the right side of the neck. High blood pressure. Initial encounter. EXAM: CT HEAD WITHOUT CONTRAST CT CERVICAL SPINE WITHOUT CONTRAST TECHNIQUE: Multidetector CT imaging of the head and cervical spine was performed following the standard protocol without intravenous contrast. Multiplanar CT image reconstructions of the cervical spine were also generated. COMPARISON:  CT  of the head performed 04/25/2016, and CT of the cervical spine performed 03/29/2016. MRI of the cervical spine performed 05/16/2016 FINDINGS: CT HEAD FINDINGS Brain: No evidence of acute infarction, hemorrhage, hydrocephalus, extra-axial collection or mass lesion/mass effect. Prominence of the ventricles sulci reflects mild cortical volume loss. Scattered periventricular white matter change likely reflects small vessel ischemic microangiopathy. The brainstem and fourth ventricle are within normal limits. The basal ganglia are unremarkable in appearance. The cerebral hemispheres demonstrate grossly normal gray-white differentiation. No mass effect or midline shift is seen. Vascular: No hyperdense vessel or unexpected calcification. Skull: There is no evidence of fracture; visualized osseous structures are unremarkable in appearance. Sinuses/Orbits: The orbits are within normal limits. The paranasal sinuses and mastoid air cells are well-aerated. Other: No significant soft tissue abnormalities are seen. CT CERVICAL SPINE FINDINGS Alignment: Normal. Skull base and vertebrae: No acute fracture. No primary bone lesion or focal pathologic process. There is a chronic relatively stable fracture of the upper dens, grossly unchanged from the prior  studies. Soft tissues and spinal canal: No prevertebral fluid or swelling. No visible canal hematoma. Chronic central canal stenosis at C1-C2 reflects the fracture described above. Disc levels: Multilevel disc space narrowing is noted, with loss of the disc spaces at C2-C4, reflecting some degree of osseous fusion. Scattered anterior and posterior disc osteophyte complexes are noted along the cervical and upper thoracic spine. Upper chest: Minimal scarring is noted at the lung apices. The patient is status post thyroidectomy. Other: No additional soft tissue abnormalities are seen. IMPRESSION: 1. No evidence of traumatic intracranial injury or fracture. 2. No evidence of fracture or subluxation along the cervical spine. 3. Mild cortical volume loss and scattered small vessel ischemic microangiopathy. 4. Diffuse degenerative change along the cervical spine, with chronic osseous fusion at C2-C4. 5. Chronic relatively stable fracture of the upper dens, with underlying chronic central canal stenosis, better characterized on prior MRI. 6. Minimal scarring at the lung apices. Electronically Signed   By: Garald Balding M.D.   On: 06/19/2016 20:51    Procedures Procedures (including critical care time)  Medications Ordered in ED Medications  fentaNYL (SUBLIMAZE) injection 25 mcg (not administered)     Initial Impression / Assessment and Plan / ED Course  I have reviewed the triage vital signs and the nursing notes.  Pertinent labs & imaging results that were available during my care of the patient were reviewed by me and considered in my medical decision making (see chart for details).  Clinical Course    82 year old female with past medical history as above who presents with right greater than left and generalized weakness. Patient unable to move around house now due to weakness. On arrival, vital signs are stable and within normal limits. CT head shows no acute abnormality. Lab work reviewed and is  overall unremarkable with exception of positive UTI. The patient has a history of previous UTIs. She is allergic to Rocephin so will start on ciprofloxacin. Patient does have significant right-sided weakness. It is unclear whether this is reactivation of possible stroke in the setting of UTI versus CVA versus possible myelopathy secondary to previous cervical injury. Given patient's inability to ambulate and perform ADLs, as well as UTI, will admit for MRI and further workup  While awaiting MRI, pt became increasingly tahcycardic, agitated, and restless. Unclear whether this is 2/2 delirium, possible akathisia from reglan administration, PRESS, CVA. Pt also has opioid dependence so w/d is on DDx. No benzos prescribed on [place] database. Will give  low-dose benadryl, home dose of opoid + fentanyl, and haldol as pt requires MRI. Discussed with Dr. Alcario Drought, admitting MD, who is in agreement. MRI pending, awaiting bed. Sign out given to Dr. Claudine Mouton.   Final Clinical Impressions(s) / ED Diagnoses   Final diagnoses:  Acute cystitis without hematuria  Right sided weakness  Essential hypertension  Delirium      Duffy Bruce, MD 06/20/16 [id number]

## 2016-06-20 ENCOUNTER — Emergency Department (HOSPITAL_COMMUNITY): Payer: Medicare Other

## 2016-06-20 DIAGNOSIS — Z79891 Long term (current) use of opiate analgesic: Secondary | ICD-10-CM | POA: Diagnosis not present

## 2016-06-20 DIAGNOSIS — R29898 Other symptoms and signs involving the musculoskeletal system: Secondary | ICD-10-CM | POA: Diagnosis not present

## 2016-06-20 DIAGNOSIS — M545 Low back pain: Secondary | ICD-10-CM | POA: Diagnosis present

## 2016-06-20 DIAGNOSIS — M4802 Spinal stenosis, cervical region: Secondary | ICD-10-CM | POA: Diagnosis not present

## 2016-06-20 DIAGNOSIS — D649 Anemia, unspecified: Secondary | ICD-10-CM | POA: Diagnosis not present

## 2016-06-20 DIAGNOSIS — I10 Essential (primary) hypertension: Secondary | ICD-10-CM | POA: Insufficient documentation

## 2016-06-20 DIAGNOSIS — H409 Unspecified glaucoma: Secondary | ICD-10-CM | POA: Diagnosis present

## 2016-06-20 DIAGNOSIS — Z823 Family history of stroke: Secondary | ICD-10-CM | POA: Diagnosis not present

## 2016-06-20 DIAGNOSIS — Z885 Allergy status to narcotic agent status: Secondary | ICD-10-CM | POA: Diagnosis not present

## 2016-06-20 DIAGNOSIS — E890 Postprocedural hypothyroidism: Secondary | ICD-10-CM | POA: Diagnosis present

## 2016-06-20 DIAGNOSIS — R410 Disorientation, unspecified: Secondary | ICD-10-CM | POA: Diagnosis not present

## 2016-06-20 DIAGNOSIS — E785 Hyperlipidemia, unspecified: Secondary | ICD-10-CM | POA: Diagnosis present

## 2016-06-20 DIAGNOSIS — D638 Anemia in other chronic diseases classified elsewhere: Secondary | ICD-10-CM | POA: Diagnosis present

## 2016-06-20 DIAGNOSIS — S12001D Unspecified nondisplaced fracture of first cervical vertebra, subsequent encounter for fracture with routine healing: Secondary | ICD-10-CM | POA: Diagnosis not present

## 2016-06-20 DIAGNOSIS — Z881 Allergy status to other antibiotic agents status: Secondary | ICD-10-CM | POA: Diagnosis not present

## 2016-06-20 DIAGNOSIS — Z85850 Personal history of malignant neoplasm of thyroid: Secondary | ICD-10-CM | POA: Diagnosis not present

## 2016-06-20 DIAGNOSIS — Z9842 Cataract extraction status, left eye: Secondary | ICD-10-CM | POA: Diagnosis not present

## 2016-06-20 DIAGNOSIS — K219 Gastro-esophageal reflux disease without esophagitis: Secondary | ICD-10-CM | POA: Diagnosis not present

## 2016-06-20 DIAGNOSIS — N3000 Acute cystitis without hematuria: Secondary | ICD-10-CM | POA: Diagnosis not present

## 2016-06-20 DIAGNOSIS — Z961 Presence of intraocular lens: Secondary | ICD-10-CM | POA: Diagnosis present

## 2016-06-20 DIAGNOSIS — L89152 Pressure ulcer of sacral region, stage 2: Secondary | ICD-10-CM | POA: Diagnosis present

## 2016-06-20 DIAGNOSIS — Z820 Family history of epilepsy and other diseases of the nervous system: Secondary | ICD-10-CM | POA: Diagnosis not present

## 2016-06-20 DIAGNOSIS — M4800 Spinal stenosis, site unspecified: Secondary | ICD-10-CM | POA: Insufficient documentation

## 2016-06-20 DIAGNOSIS — B9620 Unspecified Escherichia coli [E. coli] as the cause of diseases classified elsewhere: Secondary | ICD-10-CM | POA: Diagnosis present

## 2016-06-20 DIAGNOSIS — Z9841 Cataract extraction status, right eye: Secondary | ICD-10-CM | POA: Diagnosis not present

## 2016-06-20 DIAGNOSIS — Z888 Allergy status to other drugs, medicaments and biological substances status: Secondary | ICD-10-CM | POA: Diagnosis not present

## 2016-06-20 DIAGNOSIS — R531 Weakness: Secondary | ICD-10-CM | POA: Diagnosis not present

## 2016-06-20 DIAGNOSIS — Z880 Allergy status to penicillin: Secondary | ICD-10-CM | POA: Diagnosis not present

## 2016-06-20 DIAGNOSIS — B961 Klebsiella pneumoniae [K. pneumoniae] as the cause of diseases classified elsewhere: Secondary | ICD-10-CM | POA: Diagnosis present

## 2016-06-20 DIAGNOSIS — M5412 Radiculopathy, cervical region: Secondary | ICD-10-CM | POA: Diagnosis present

## 2016-06-20 DIAGNOSIS — N390 Urinary tract infection, site not specified: Secondary | ICD-10-CM | POA: Diagnosis present

## 2016-06-20 LAB — IRON AND TIBC
Iron: 30 ug/dL (ref 28–170)
SATURATION RATIOS: 10 % — AB (ref 10.4–31.8)
TIBC: 302 ug/dL (ref 250–450)
UIBC: 272 ug/dL

## 2016-06-20 LAB — FOLATE: FOLATE: 22.4 ng/mL (ref >5.9)

## 2016-06-20 LAB — RETICULOCYTES
RBC.: 4.37 MIL/uL (ref 3.87–5.11)
RETIC CT PCT: 0.8 % (ref 0.4–3.1)
Retic Count, Absolute: 35 10*3/uL (ref 19.0–186.0)

## 2016-06-20 LAB — VITAMIN B12: Vitamin B-12: 1098 pg/mL — ABNORMAL HIGH (ref 180–914)

## 2016-06-20 LAB — FERRITIN: Ferritin: 194 ng/mL (ref 11–307)

## 2016-06-20 MED ORDER — TRAZODONE HCL 50 MG PO TABS
50.0000 mg | ORAL_TABLET | Freq: Every evening | ORAL | Status: DC | PRN
  Administered 2016-06-20 – 2016-06-22 (×2): 50 mg via ORAL
  Filled 2016-06-20 (×2): qty 1

## 2016-06-20 MED ORDER — PROMETHAZINE HCL 25 MG/ML IJ SOLN
12.5000 mg | Freq: Once | INTRAMUSCULAR | Status: AC
  Administered 2016-06-20: 12.5 mg via INTRAVENOUS
  Filled 2016-06-20: qty 1

## 2016-06-20 MED ORDER — FENTANYL CITRATE (PF) 100 MCG/2ML IJ SOLN
INTRAMUSCULAR | Status: AC
  Administered 2016-06-20: 50 ug via INTRAVENOUS
  Filled 2016-06-20: qty 2

## 2016-06-20 MED ORDER — OXYCODONE HCL 5 MG PO TABS
15.0000 mg | ORAL_TABLET | Freq: Once | ORAL | Status: AC
  Administered 2016-06-20: 15 mg via ORAL
  Filled 2016-06-20: qty 3

## 2016-06-20 MED ORDER — ENOXAPARIN SODIUM 40 MG/0.4ML ~~LOC~~ SOLN
40.0000 mg | SUBCUTANEOUS | Status: DC
  Administered 2016-06-20 – 2016-06-21 (×2): 40 mg via SUBCUTANEOUS
  Filled 2016-06-20 (×2): qty 0.4

## 2016-06-20 MED ORDER — LATANOPROST 0.005 % OP SOLN
1.0000 [drp] | Freq: Every day | OPHTHALMIC | Status: DC
  Administered 2016-06-20 – 2016-06-22 (×3): 1 [drp] via OPHTHALMIC
  Filled 2016-06-20: qty 2.5

## 2016-06-20 MED ORDER — DEXAMETHASONE SODIUM PHOSPHATE 10 MG/ML IJ SOLN
10.0000 mg | Freq: Every day | INTRAMUSCULAR | Status: DC
  Administered 2016-06-21 – 2016-06-23 (×3): 10 mg via INTRAVENOUS
  Filled 2016-06-20 (×3): qty 1

## 2016-06-20 MED ORDER — FENTANYL CITRATE (PF) 100 MCG/2ML IJ SOLN
25.0000 ug | Freq: Once | INTRAMUSCULAR | Status: AC
  Administered 2016-06-20: 25 ug via INTRAVENOUS
  Filled 2016-06-20: qty 2

## 2016-06-20 MED ORDER — ONDANSETRON HCL 4 MG PO TABS: 4.0000 mg | ORAL_TABLET | Freq: Four times a day (QID) | ORAL | Status: DC | PRN

## 2016-06-20 MED ORDER — BRIMONIDINE TARTRATE 0.2 % OP SOLN
1.0000 [drp] | Freq: Two times a day (BID) | OPHTHALMIC | Status: DC
  Administered 2016-06-20 – 2016-06-23 (×6): 1 [drp] via OPHTHALMIC
  Filled 2016-06-20: qty 5

## 2016-06-20 MED ORDER — SPIRONOLACTONE 25 MG PO TABS
25.0000 mg | ORAL_TABLET | Freq: Two times a day (BID) | ORAL | Status: DC
  Administered 2016-06-20 – 2016-06-23 (×8): 25 mg via ORAL
  Filled 2016-06-20 (×8): qty 1

## 2016-06-20 MED ORDER — FENTANYL CITRATE (PF) 100 MCG/2ML IJ SOLN
25.0000 ug | Freq: Once | INTRAMUSCULAR | Status: DC
  Administered 2016-06-20: 50 ug via INTRAVENOUS

## 2016-06-20 MED ORDER — HYDRALAZINE HCL 20 MG/ML IJ SOLN
10.0000 mg | Freq: Once | INTRAMUSCULAR | Status: AC
  Administered 2016-06-20: 10 mg via INTRAVENOUS
  Filled 2016-06-20: qty 1

## 2016-06-20 MED ORDER — NAPROXEN 250 MG PO TABS
500.0000 mg | ORAL_TABLET | Freq: Once | ORAL | Status: AC
  Administered 2016-06-20: 500 mg via ORAL
  Filled 2016-06-20: qty 2

## 2016-06-20 MED ORDER — DEXAMETHASONE SODIUM PHOSPHATE 10 MG/ML IJ SOLN
16.0000 mg | Freq: Once | INTRAMUSCULAR | Status: AC
Start: 2016-06-20 — End: 2016-06-20
  Administered 2016-06-20: 16 mg via INTRAVENOUS
  Filled 2016-06-20: qty 2

## 2016-06-20 MED ORDER — SODIUM CHLORIDE 0.9 % IV SOLN
Freq: Once | INTRAVENOUS | Status: AC
  Administered 2016-06-20: 19:00:00 via INTRAVENOUS

## 2016-06-20 MED ORDER — ONDANSETRON HCL 4 MG/2ML IJ SOLN
4.0000 mg | Freq: Four times a day (QID) | INTRAMUSCULAR | Status: DC | PRN
  Administered 2016-06-20: 4 mg via INTRAVENOUS
  Filled 2016-06-20: qty 2

## 2016-06-20 MED ORDER — FENTANYL CITRATE (PF) 100 MCG/2ML IJ SOLN
50.0000 ug | Freq: Once | INTRAMUSCULAR | Status: AC
  Administered 2016-06-20: 50 ug via INTRAVENOUS
  Filled 2016-06-20: qty 2

## 2016-06-20 MED ORDER — HYDROCODONE-ACETAMINOPHEN 5-325 MG PO TABS
1.0000 | ORAL_TABLET | ORAL | Status: DC | PRN
  Administered 2016-06-20: 2 via ORAL
  Administered 2016-06-21: 1 via ORAL
  Filled 2016-06-20: qty 1
  Filled 2016-06-20: qty 2

## 2016-06-20 MED ORDER — DIPHENHYDRAMINE HCL 50 MG/ML IJ SOLN
12.5000 mg | Freq: Once | INTRAMUSCULAR | Status: AC
  Administered 2016-06-20: 12.5 mg via INTRAVENOUS
  Filled 2016-06-20: qty 1

## 2016-06-20 MED ORDER — LORAZEPAM 2 MG/ML IJ SOLN
0.5000 mg | Freq: Once | INTRAMUSCULAR | Status: DC
  Filled 2016-06-20: qty 1

## 2016-06-20 MED ORDER — ACETAMINOPHEN 650 MG RE SUPP: 650.0000 mg | Freq: Four times a day (QID) | RECTAL | Status: DC | PRN

## 2016-06-20 MED ORDER — CIPROFLOXACIN IN D5W 400 MG/200ML IV SOLN
400.0000 mg | Freq: Two times a day (BID) | INTRAVENOUS | Status: DC
  Administered 2016-06-20 – 2016-06-22 (×5): 400 mg via INTRAVENOUS
  Filled 2016-06-20 (×5): qty 200

## 2016-06-20 MED ORDER — NAPROXEN 250 MG PO TABS: 500.0000 mg | ORAL_TABLET | Freq: Two times a day (BID) | ORAL | Status: DC

## 2016-06-20 MED ORDER — HYDRALAZINE HCL 20 MG/ML IJ SOLN
5.0000 mg | INTRAMUSCULAR | Status: DC | PRN
  Administered 2016-06-20: 5 mg via INTRAVENOUS
  Administered 2016-06-21 – 2016-06-22 (×3): 10 mg via INTRAVENOUS
  Filled 2016-06-20 (×4): qty 1

## 2016-06-20 MED ORDER — SODIUM CHLORIDE 0.9 % IV SOLN
INTRAVENOUS | Status: AC
  Administered 2016-06-20: 12:00:00 via INTRAVENOUS

## 2016-06-20 MED ORDER — OXYCODONE HCL ER 10 MG PO T12A
10.0000 mg | EXTENDED_RELEASE_TABLET | Freq: Two times a day (BID) | ORAL | Status: DC
  Administered 2016-06-20 – 2016-06-23 (×6): 10 mg via ORAL
  Filled 2016-06-20 (×6): qty 1

## 2016-06-20 MED ORDER — HALOPERIDOL LACTATE 5 MG/ML IJ SOLN
2.0000 mg | Freq: Once | INTRAMUSCULAR | Status: AC
  Administered 2016-06-20: 2 mg via INTRAVENOUS
  Filled 2016-06-20: qty 1

## 2016-06-20 MED ORDER — LEVOTHYROXINE SODIUM 25 MCG PO TABS
137.0000 ug | ORAL_TABLET | Freq: Every day | ORAL | Status: DC
  Administered 2016-06-20 – 2016-06-22 (×3): 137 ug via ORAL
  Filled 2016-06-20 (×4): qty 1

## 2016-06-20 MED ORDER — ACETAMINOPHEN 325 MG PO TABS
650.0000 mg | ORAL_TABLET | Freq: Four times a day (QID) | ORAL | Status: DC | PRN
Start: 2016-06-20 — End: 2016-06-23
  Administered 2016-06-20 – 2016-06-21 (×2): 650 mg via ORAL
  Filled 2016-06-20 (×2): qty 2

## 2016-06-20 MED ORDER — KETOROLAC TROMETHAMINE 15 MG/ML IJ SOLN
15.0000 mg | Freq: Four times a day (QID) | INTRAMUSCULAR | Status: DC | PRN
  Administered 2016-06-20 – 2016-06-21 (×3): 15 mg via INTRAVENOUS
  Filled 2016-06-20 (×3): qty 1

## 2016-06-20 MED ORDER — PROPRANOLOL HCL 20 MG PO TABS
20.0000 mg | ORAL_TABLET | Freq: Every day | ORAL | Status: DC
  Administered 2016-06-20 – 2016-06-23 (×4): 20 mg via ORAL
  Filled 2016-06-20 (×4): qty 1

## 2016-06-20 MED ORDER — DIAZEPAM 5 MG/ML IJ SOLN: 2.5000 mg | Freq: Three times a day (TID) | INTRAMUSCULAR | Status: DC | PRN

## 2016-06-20 MED ORDER — DORZOLAMIDE HCL-TIMOLOL MAL 2-0.5 % OP SOLN
1.0000 [drp] | Freq: Two times a day (BID) | OPHTHALMIC | Status: DC
  Administered 2016-06-20 – 2016-06-23 (×6): 1 [drp] via OPHTHALMIC
  Filled 2016-06-20: qty 10

## 2016-06-20 NOTE — ED Notes (Signed)
Pt unable to go to MRI at this time due to anxiety and headache

## 2016-06-20 NOTE — H&P (Signed)
History and Physical    [name] [id number] DOB: 1925-01-30 DOA: 06/19/2016  PCP: Merrilee Seashore, MD Patient coming from: home  Chief Complaint: right upper extremity weakness/bilateral LE weakness  HPI: [name] is a delightful 82 y.o. female with medical history significant  for hypertension, glaucoma, macular degeneration, chronic back pain, GERD, hypothyroidism, thyroid cancer, recent fall with right femoral neck fracture urgently repaired and odontoid fracture since to the emergency department with the chief complaint bilateral lower extremity weakness and right upper extremity weakness. Initial evaluation reveals UTI and MRI significant for increasing spinal stenosis.  Information is obtained from the patient. She states months ago she fell sustained several injuries one of which wasn't neck fracture. She states she will call her for a while and then was able to come out of that. Over the last several days she developed persistent worsening bilateral lower extremity weakness and right upper extremity weakness. Associated symptoms include decreased oral intake generalized weakness some nausea without emesis. She denies numbness or tingling of her extremities. She denies headache visual disturbances difficulty chewing or swallowing. She denies any abdominal pain diarrhea constipation melena. She denies dysuria hematuria frequency or urgency.    ED Course: While in the emergency department she's afebrile hypertensive had an episode of tachycardia she is not hypoxic. She is provided with pain medicine on several occasions gentle IV fluids.  Review of Systems: As per HPI otherwise 10 point review of systems negative.   Ambulatory Status: Patient prior to this episode ambulates with a cane with an unsteady gait recent hip surgery  Past Medical History:  Diagnosis Date  . Anemia   . Angina   . Arthritis   . Blood transfusion   . Chronic back pain   . Exertional  dyspnea 01/17/12  . Fracture 2008   back  . GERD (gastroesophageal reflux disease)   . Glaucoma   . H/O hiatal hernia   . Headache(784.0)   . Hypertension   . Hypoglycemia   . Hypothyroidism   . Macular degeneration of both eyes   . Metabolic acidemia [id number]  . Osteomyelitis ([place]) 11/2011   right knee  . Pneumonia 01/2010  . Shortness of breath 11/08/11   "here lately, all the time"  . Thyroid cancer ([place]) 1986    Past Surgical History:  Procedure Laterality Date  . ABDOMINAL HYSTERECTOMY  1963  . APPENDECTOMY    . BACK SURGERY     "3 back OR's;"; S/P "fractured back 2008"  . CARPAL TUNNEL RELEASE     bilaterally  . CATARACT EXTRACTION W/ INTRAOCULAR LENS  IMPLANT, BILATERAL    . DILATION AND CURETTAGE OF UTERUS    . FRACTURE SURGERY     "broke back"  . goiter removed  1980's   "when I was young"  . hand OR  09/2011   "left hand; bones were coming together"  . HIP ARTHROPLASTY Right 01/23/2016   Procedure: ARTHROPLASTY BIPOLAR HIP (HEMIARTHROPLASTY);  Surgeon: Gaynelle Arabian, MD;  Location: [facility];  Service: Orthopedics;  Laterality: Right;  Depuy  . I&D KNEE WITH POLY EXCHANGE  11/10/2011   Procedure: IRRIGATION AND DEBRIDEMENT KNEE WITH POLY EXCHANGE;  Surgeon: Yvette Rack., MD;  Location: [facility];  Service: Orthopedics;  Laterality: Right;  . JOINT REPLACEMENT  2005, 2006, 2012, 11/10/11   left; left; right; right; right  . PERIPHERALLY INSERTED CENTRAL CATHETER INSERTION  11/2011   "removed 12/2011; LUE"  . post polyethelene exchange  11/09/11   RLE  .  SHOULDER ARTHROSCOPY W/ ROTATOR CUFF REPAIR    . THYROIDECTOMY  1986  . TONSILLECTOMY AND ADENOIDECTOMY    . TOTAL KNEE REVISION  04/28/2012   Procedure: TOTAL KNEE REVISION;  Surgeon: Rudean Haskell, MD;  Location: [facility];  Service: Orthopedics;  Laterality: Right;  . TRIGGER FINGER RELEASE     right  thumb    Social History   Social History  . Marital status: Married    Spouse name: N/A  . Number of children: N/A    . Years of education: N/A   Occupational History  . Not on file.   Social History Main Topics  . Smoking status: Never Smoker  . Smokeless tobacco: Never Used  . Alcohol use No  . Drug use: No  . Sexual activity: Not Currently   Other Topics Concern  . Not on file   Social History Narrative  . No narrative on file    Allergies  Allergen Reactions  . Demerol Shortness Of Breath  . Penicillins Swelling    "in the face" Has patient had a PCN reaction causing immediate rash, facial/tongue/throat swelling, SOB or lightheadedness with hypotension: yes Has patient had a PCN reaction causing severe rash involving mucus membranes or skin necrosis: no Has patient had a PCN reaction that required hospitalization: no  Has patient had a PCN reaction occurring within the last 10 years: no If all of the above answers are "NO", then may proceed with Cephalosporin use.   . Corticosteroids Other (See Comments)    DIZZINESS, HEADACHE  . Lorazepam Anxiety  . Tetracyclines & Related Rash  . Vancomycin Rash    Family History  Problem Relation Age of Onset  . Intracerebral hemorrhage Mother   . Stroke Father   . Parkinson's disease Brother     Prior to Admission medications   Medication Sig Start Date End Date Taking? Authorizing Provider  brimonidine (ALPHAGAN) 0.2 % ophthalmic solution Place 1 drop into both eyes 2 (two) times daily. 01/12/16  Yes Historical Provider, MD  dorzolamide-timolol (COSOPT) 22.3-6.8 MG/ML ophthalmic solution Place 1 drop into both eyes 2 (two) times daily. 01/12/16  Yes Historical Provider, MD  latanoprost (XALATAN) 0.005 % ophthalmic solution Place 1 drop into both eyes at bedtime.  01/05/13  Yes Historical Provider, MD  levothyroxine (SYNTHROID, LEVOTHROID) 137 MCG tablet Take 1 tablet (137 mcg total) by mouth daily before breakfast. 01/26/16  Yes Courage Emokpae, MD  propranolol (INDERAL) 20 MG tablet Take 20 mg by mouth daily. Takes for migraines.   Yes  Historical Provider, MD  spironolactone (ALDACTONE) 25 MG tablet Take 25 mg by mouth 2 (two) times daily. 04/30/16  Yes Historical Provider, MD    Physical Exam: Vitals:   06/20/16 0345 06/20/16 0500 06/20/16 0515 06/20/16 0859  BP: (!) 215/57 (!) 213/86 (!) 214/86 (!) 210/71  Pulse: 104 74 67 88  Resp: 14 13 14 12   Temp:    97.9 F (36.6 C)  TempSrc:    Oral  SpO2: 95% 97% 100% 98%  Weight:      Height:         General:  Appears calm and comfortable, somewhat pale and frail Eyes:  PERRL, EOMI, normal lids, iris ENT:  grossly normal hearing, lips & tongue, his membranes of her mouth are pink and dry Neck:  no LAD, masses or thyromegaly Cardiovascular:  RRR, no m/r/g. No LE edema. Pedal pulses present and palpable Respiratory:  CTA bilaterally, no w/r/r. Normal respiratory effort. Abdomen:  soft, ntnd, positive bowel sounds no guarding or rebounding Skin:  no rash or induration seen on limited exam,  Musculoskeletal:  grossly normal tone BUE/BLE, good ROM, no bony abnormality hard cervical collar in place Psychiatric:  grossly normal mood and affect, speech fluent and appropriate, AOx3 Neurologic:  CN 2-12 grossly intact, moves all extremities in coordinated fashion, sensation intact left grip 5 out of 5 right grip 4 out of 5 bilateral lower extremity strength 4 out of 5 and station intact  Labs on Admission: I have personally reviewed following labs and imaging studies  CBC:  Recent Labs Lab 06/19/16 2201  WBC 5.1  NEUTROABS 3.8  HGB 10.6*  HCT 32.6*  MCV 94.2  PLT [id number]   Basic Metabolic Panel:  Recent Labs Lab 06/19/16 2201  NA 136  K 4.2  CL 102  CO2 25  GLUCOSE 95  BUN 16  CREATININE 0.80  CALCIUM 8.7*   GFR: Estimated Creatinine Clearance: 41.1 mL/min (by C-G formula based on SCr of 0.8 mg/dL). Liver Function Tests:  Recent Labs Lab 06/19/16 2201  AST 16  ALT 12*  ALKPHOS 74  BILITOT 0.8  PROT 5.8*  ALBUMIN 3.7   No results for input(s):  LIPASE, AMYLASE in the last 168 hours. No results for input(s): AMMONIA in the last 168 hours. Coagulation Profile: No results for input(s): INR, PROTIME in the last 168 hours. Cardiac Enzymes:  Recent Labs Lab 06/19/16 2201  CKTOTAL 88   BNP (last 3 results) No results for input(s): PROBNP in the last 8760 hours. HbA1C: No results for input(s): HGBA1C in the last 72 hours. CBG: No results for input(s): GLUCAP in the last 168 hours. Lipid Profile: No results for input(s): CHOL, HDL, LDLCALC, TRIG, CHOLHDL, LDLDIRECT in the last 72 hours. Thyroid Function Tests: No results for input(s): TSH, T4TOTAL, FREET4, T3FREE, THYROIDAB in the last 72 hours. Anemia Panel: No results for input(s): VITAMINB12, FOLATE, FERRITIN, TIBC, IRON, RETICCTPCT in the last 72 hours. Urine analysis:    Component Value Date/Time   COLORURINE YELLOW 06/19/2016 2110   APPEARANCEUR CLOUDY (A) 06/19/2016 2110   LABSPEC 1.012 06/19/2016 2110   PHURINE 6.5 06/19/2016 2110   GLUCOSEU NEGATIVE 06/19/2016 2110   HGBUR MODERATE (A) 06/19/2016 2110   BILIRUBINUR NEGATIVE 06/19/2016 2110   KETONESUR NEGATIVE 06/19/2016 2110   PROTEINUR NEGATIVE 06/19/2016 2110   UROBILINOGEN 1.0 02/18/2014 1617   NITRITE NEGATIVE 06/19/2016 2110   LEUKOCYTESUR LARGE (A) 06/19/2016 2110    Creatinine Clearance: Estimated Creatinine Clearance: 41.1 mL/min (by C-G formula based on SCr of 0.8 mg/dL).  Sepsis Labs: @LABRCNTIP (procalcitonin:4,lacticidven:4) )No results found for this or any previous visit (from the past 240 hour(s)).   Radiological Exams on Admission: Ct Head Wo Contrast  Result Date: 06/19/2016 CLINICAL DATA:  Acute onset of right-sided headache, radiating down the right side of the neck. High blood pressure. Initial encounter. EXAM: CT HEAD WITHOUT CONTRAST CT CERVICAL SPINE WITHOUT CONTRAST TECHNIQUE: Multidetector CT imaging of the head and cervical spine was performed following the standard protocol  without intravenous contrast. Multiplanar CT image reconstructions of the cervical spine were also generated. COMPARISON:  CT of the head performed 04/25/2016, and CT of the cervical spine performed 03/29/2016. MRI of the cervical spine performed 05/16/2016 FINDINGS: CT HEAD FINDINGS Brain: No evidence of acute infarction, hemorrhage, hydrocephalus, extra-axial collection or mass lesion/mass effect. Prominence of the ventricles sulci reflects mild cortical volume loss. Scattered periventricular white matter change likely reflects small vessel ischemic microangiopathy. The brainstem  and fourth ventricle are within normal limits. The basal ganglia are unremarkable in appearance. The cerebral hemispheres demonstrate grossly normal gray-white differentiation. No mass effect or midline shift is seen. Vascular: No hyperdense vessel or unexpected calcification. Skull: There is no evidence of fracture; visualized osseous structures are unremarkable in appearance. Sinuses/Orbits: The orbits are within normal limits. The paranasal sinuses and mastoid air cells are well-aerated. Other: No significant soft tissue abnormalities are seen. CT CERVICAL SPINE FINDINGS Alignment: Normal. Skull base and vertebrae: No acute fracture. No primary bone lesion or focal pathologic process. There is a chronic relatively stable fracture of the upper dens, grossly unchanged from the prior studies. Soft tissues and spinal canal: No prevertebral fluid or swelling. No visible canal hematoma. Chronic central canal stenosis at C1-C2 reflects the fracture described above. Disc levels: Multilevel disc space narrowing is noted, with loss of the disc spaces at C2-C4, reflecting some degree of osseous fusion. Scattered anterior and posterior disc osteophyte complexes are noted along the cervical and upper thoracic spine. Upper chest: Minimal scarring is noted at the lung apices. The patient is status post thyroidectomy. Other: No additional soft tissue  abnormalities are seen. IMPRESSION: 1. No evidence of traumatic intracranial injury or fracture. 2. No evidence of fracture or subluxation along the cervical spine. 3. Mild cortical volume loss and scattered small vessel ischemic microangiopathy. 4. Diffuse degenerative change along the cervical spine, with chronic osseous fusion at C2-C4. 5. Chronic relatively stable fracture of the upper dens, with underlying chronic central canal stenosis, better characterized on prior MRI. 6. Minimal scarring at the lung apices. Electronically Signed   By: Garald Balding M.D.   On: 06/19/2016 20:51   Ct Cervical Spine Wo Contrast  Result Date: 06/19/2016 CLINICAL DATA:  Acute onset of right-sided headache, radiating down the right side of the neck. High blood pressure. Initial encounter. EXAM: CT HEAD WITHOUT CONTRAST CT CERVICAL SPINE WITHOUT CONTRAST TECHNIQUE: Multidetector CT imaging of the head and cervical spine was performed following the standard protocol without intravenous contrast. Multiplanar CT image reconstructions of the cervical spine were also generated. COMPARISON:  CT of the head performed 04/25/2016, and CT of the cervical spine performed 03/29/2016. MRI of the cervical spine performed 05/16/2016 FINDINGS: CT HEAD FINDINGS Brain: No evidence of acute infarction, hemorrhage, hydrocephalus, extra-axial collection or mass lesion/mass effect. Prominence of the ventricles sulci reflects mild cortical volume loss. Scattered periventricular white matter change likely reflects small vessel ischemic microangiopathy. The brainstem and fourth ventricle are within normal limits. The basal ganglia are unremarkable in appearance. The cerebral hemispheres demonstrate grossly normal gray-white differentiation. No mass effect or midline shift is seen. Vascular: No hyperdense vessel or unexpected calcification. Skull: There is no evidence of fracture; visualized osseous structures are unremarkable in appearance.  Sinuses/Orbits: The orbits are within normal limits. The paranasal sinuses and mastoid air cells are well-aerated. Other: No significant soft tissue abnormalities are seen. CT CERVICAL SPINE FINDINGS Alignment: Normal. Skull base and vertebrae: No acute fracture. No primary bone lesion or focal pathologic process. There is a chronic relatively stable fracture of the upper dens, grossly unchanged from the prior studies. Soft tissues and spinal canal: No prevertebral fluid or swelling. No visible canal hematoma. Chronic central canal stenosis at C1-C2 reflects the fracture described above. Disc levels: Multilevel disc space narrowing is noted, with loss of the disc spaces at C2-C4, reflecting some degree of osseous fusion. Scattered anterior and posterior disc osteophyte complexes are noted along the cervical and upper thoracic  spine. Upper chest: Minimal scarring is noted at the lung apices. The patient is status post thyroidectomy. Other: No additional soft tissue abnormalities are seen. IMPRESSION: 1. No evidence of traumatic intracranial injury or fracture. 2. No evidence of fracture or subluxation along the cervical spine. 3. Mild cortical volume loss and scattered small vessel ischemic microangiopathy. 4. Diffuse degenerative change along the cervical spine, with chronic osseous fusion at C2-C4. 5. Chronic relatively stable fracture of the upper dens, with underlying chronic central canal stenosis, better characterized on prior MRI. 6. Minimal scarring at the lung apices. Electronically Signed   By: Garald Balding M.D.   On: 06/19/2016 20:51   Mr Brain Wo Contrast  Addendum Date: 06/20/2016   ADDENDUM REPORT: 06/20/2016 09:52 ADDENDUM: Critical Value/emergent results were called by telephone at the time of interpretation on 06/20/2016 at 0935 hours to ED PA Alecia Lemming, who verbally acknowledged these results. Electronically Signed   By: Genevie Ann M.D.   On: 06/20/2016 09:52   Result Date:  06/20/2016 CLINICAL DATA:  82 year old female with progressive right side weakness/paralysis x1 month. C1 vertebral and Type 2 dens fractures sustained in May with subsequent C1-C2 spinal stenosis. Initial encounter. EXAM: MRI HEAD WITHOUT CONTRAST MRI CERVICAL SPINE WITHOUT CONTRAST TECHNIQUE: Multiplanar, multiecho pulse sequences of the brain and surrounding structures, and cervical spine, to include the craniocervical junction and cervicothoracic junction, were obtained without intravenous contrast. COMPARISON:  Head and cervical spine CT 06/19/2016 and earlier. Cervical spine MRI 05/16/2016. FINDINGS: MRI HEAD FINDINGS Brain: No restricted diffusion to suggest acute infarction. No midline shift, mass effect, evidence of mass lesion, ventriculomegaly, extra-axial collection or acute intracranial hemorrhage. Pituitary within normal limits. [place] Cables and white matter signal is within normal limits for age throughout the brain. No abnormal signal in the brainstem. Vascular: Major intracranial vascular flow voids appear within normal limits. Skull and upper cervical spine: Reported below. Sinuses/Orbits: Postoperative changes the globes, otherwise negative orbits soft tissues. Visualized paranasal sinuses and mastoids are stable and well pneumatized. Other: Visible internal auditory structures appear normal. Negative scalp soft tissues. MRI CERVICAL SPINE FINDINGS Study is intermittently degraded by motion artifact despite repeated imaging attempts. Alignment: C2 through C4 interbody and posterior element ankylosis. Superimposed left side C4-C5 posterior element ankylosis and C5-C6 interbody ankylosis. Subsequently, the cervical spine is essentially fused from the C2 body to the C6 level. Alignment of this fused segments is stable over these series of exams. Un healed type 2 odontoid fracture persists as seen on the CT yesterday. Vertebrae:   No new osseous abnormality identified. Cord: Ligamentous hypertrophy about the  odontoid and especially posterior ligamentous hypertrophy about the posterior C1 ring again noted and combine for moderate spinal stenosis at the C1-C2 level (series 16, image 11 and series 14, image 7) which appears mildly progressed since September. Subsequent moderate mass effect on the spinal cord in the AP direction (series 16, image 11). No definite cord edema. Below C1-C2 there is additional multilevel cervical spinal stenosis. No definite cord signal abnormality at those levels. Visualized upper thoracic spinal cord appears within normal limits. Posterior Fossa, vertebral arteries, paraspinal tissues: Posterior fossa detailed above. New since 05/16/2016 abnormal increased prevertebral confluent T2 and STIR hyperintensity from the skullbase to C6-C7 (series 14, image 5). New confluent abnormal posterior paraspinal muscle edema (seen in the midline on also series 14, image 5). Other bilateral neck soft tissues appear stable. Preserved major vascular flow voids. Partially retropharyngeal course of both carotids. Disc levels: Degenerative multifactorial spinal stenosis  at C2-C3, C3-C4, C4-C5, C5-C6, and C6-C7 appears stable since September. IMPRESSION: 1. Mild further progression since September of moderate C1-C2 spinal stenosis with upper cervical cord compression due to ligamentous hypertrophy and mild bony displacement about the un-healed type 2 odontoid fracture. 2. No definite cervical spinal cord edema or signal abnormality. Multifactorial degenerative spinal stenosis also from C2-C3 through C6-C7 appears stable since September, including some lesser cord mass effect. 3. New diffuse abnormal signal in the posterior paraspinal muscles. If there has been no interval trauma then perhaps this reflects widespread a Muscle Denervation Injury related to the progressive compressive myelopathy. 4. Increased abnormal prevertebral soft tissue edema also noted, nonspecific. 5. Underlying chronic cervical spine  ankylosis from the C2 body to the C6 vertebra. 6. No acute intracranial abnormality. Normal for age noncontrast MRI appearance of the brain. Electronically Signed: By: Genevie Ann M.D. On: 06/20/2016 09:30   Mr Cervical Spine Wo Contrast  Addendum Date: 06/20/2016   ADDENDUM REPORT: 06/20/2016 09:52 ADDENDUM: Critical Value/emergent results were called by telephone at the time of interpretation on 06/20/2016 at 0935 hours to ED PA Alecia Lemming, who verbally acknowledged these results. Electronically Signed   By: Genevie Ann M.D.   On: 06/20/2016 09:52   Result Date: 06/20/2016 CLINICAL DATA:  82 year old female with progressive right side weakness/paralysis x1 month. C1 vertebral and Type 2 dens fractures sustained in May with subsequent C1-C2 spinal stenosis. Initial encounter. EXAM: MRI HEAD WITHOUT CONTRAST MRI CERVICAL SPINE WITHOUT CONTRAST TECHNIQUE: Multiplanar, multiecho pulse sequences of the brain and surrounding structures, and cervical spine, to include the craniocervical junction and cervicothoracic junction, were obtained without intravenous contrast. COMPARISON:  Head and cervical spine CT 06/19/2016 and earlier. Cervical spine MRI 05/16/2016. FINDINGS: MRI HEAD FINDINGS Brain: No restricted diffusion to suggest acute infarction. No midline shift, mass effect, evidence of mass lesion, ventriculomegaly, extra-axial collection or acute intracranial hemorrhage. Pituitary within normal limits. [place] Cables and white matter signal is within normal limits for age throughout the brain. No abnormal signal in the brainstem. Vascular: Major intracranial vascular flow voids appear within normal limits. Skull and upper cervical spine: Reported below. Sinuses/Orbits: Postoperative changes the globes, otherwise negative orbits soft tissues. Visualized paranasal sinuses and mastoids are stable and well pneumatized. Other: Visible internal auditory structures appear normal. Negative scalp soft tissues. MRI CERVICAL SPINE  FINDINGS Study is intermittently degraded by motion artifact despite repeated imaging attempts. Alignment: C2 through C4 interbody and posterior element ankylosis. Superimposed left side C4-C5 posterior element ankylosis and C5-C6 interbody ankylosis. Subsequently, the cervical spine is essentially fused from the C2 body to the C6 level. Alignment of this fused segments is stable over these series of exams. Un healed type 2 odontoid fracture persists as seen on the CT yesterday. Vertebrae:   No new osseous abnormality identified. Cord: Ligamentous hypertrophy about the odontoid and especially posterior ligamentous hypertrophy about the posterior C1 ring again noted and combine for moderate spinal stenosis at the C1-C2 level (series 16, image 11 and series 14, image 7) which appears mildly progressed since September. Subsequent moderate mass effect on the spinal cord in the AP direction (series 16, image 11). No definite cord edema. Below C1-C2 there is additional multilevel cervical spinal stenosis. No definite cord signal abnormality at those levels. Visualized upper thoracic spinal cord appears within normal limits. Posterior Fossa, vertebral arteries, paraspinal tissues: Posterior fossa detailed above. New since 05/16/2016 abnormal increased prevertebral confluent T2 and STIR hyperintensity from the skullbase to C6-C7 (series 14, image 5).  New confluent abnormal posterior paraspinal muscle edema (seen in the midline on also series 14, image 5). Other bilateral neck soft tissues appear stable. Preserved major vascular flow voids. Partially retropharyngeal course of both carotids. Disc levels: Degenerative multifactorial spinal stenosis at C2-C3, C3-C4, C4-C5, C5-C6, and C6-C7 appears stable since September. IMPRESSION: 1. Mild further progression since September of moderate C1-C2 spinal stenosis with upper cervical cord compression due to ligamentous hypertrophy and mild bony displacement about the un-healed type  2 odontoid fracture. 2. No definite cervical spinal cord edema or signal abnormality. Multifactorial degenerative spinal stenosis also from C2-C3 through C6-C7 appears stable since September, including some lesser cord mass effect. 3. New diffuse abnormal signal in the posterior paraspinal muscles. If there has been no interval trauma then perhaps this reflects widespread a Muscle Denervation Injury related to the progressive compressive myelopathy. 4. Increased abnormal prevertebral soft tissue edema also noted, nonspecific. 5. Underlying chronic cervical spine ankylosis from the C2 body to the C6 vertebra. 6. No acute intracranial abnormality. Normal for age noncontrast MRI appearance of the brain. Electronically Signed: By: Genevie Ann M.D. On: 06/20/2016 09:30    EKG: Independently reviewed. Sinus rhythm  Assessment/Plan Principal Problem:   Spinal stenosis in cervical region Active Problems:   Hypertension   Anemia of unknown etiology   Hypothyroidism   Protein-calorie malnutrition, moderate (HCC)   GERD (gastroesophageal reflux disease)   Odontoid fracture (HCC)   Upper extremity weakness   UTI (urinary tract infection)   Bilateral leg weakness   #1. worsening spinal stenosis and C-spine/history odontoid fracture/upper extremity weakness. MRI as noted above. Evaluated by neurosurgery opined not a good surgical candidate but will consult -Admit to medical bed -Continue cervical collar -Decadron 16 mg now -Decadron 10 mg daily starting tomorrow -Analgesia -PT -Monitor -Appreciate neurosurgery recommendations  #2. Urinary tract infection. She had urinary retention 5 months ago after surgery with associated UTI as well. Denies dysuria. She is afebrile hemodynamically stable and nontoxic appearing -Gentle IV fluids -Continue Cipro initiated in the emergency department. Of note patient has allergy to Rocephin -Follow urine cultures -Monitor urine output  #3. Bilateral lower extremity  weakness/low back pain. Inability to ambulate over the last several days. History of chronic low back pain -Consider imaging of lumbar spine -PT -Continue home pain regimen  #4. Hypertension. Poor control in the emergency department. Home medications include Inderal spironolactone -Continue home meds -Monitor -Improved pain control  #5. Anemia. Likely of chronic disease. Hemoglobin 10. This appears to be her baseline. No signs symptoms of active bleeding -Anemia panel -Continue home meds  #. Hypothyroidism. -TSH -Continue home meds    DVT prophylaxis: lovenox  Code Status: limited  Family Communication: none present  Disposition Plan: may need placement  Consults called: jones neurosurger  Admission status: inpatient    Radene Gunning MD [facility]  If 7PM-7AM, please contact night-coverage [email or web address] Password [email or web address]  06/20/2016, 11:25 AM

## 2016-06-20 NOTE — ED Notes (Signed)
Pt return to room from MRI

## 2016-06-20 NOTE — ED Notes (Signed)
Pt's daughter left cell number for contact.  Arbie Cookey @ [phone]

## 2016-06-20 NOTE — ED Notes (Signed)
Pt's family going home but left contact number.  Yevonne Pax cell # [phone].

## 2016-06-20 NOTE — Progress Notes (Signed)
Patient arrived from ED at this time to 5M06. Safety precautions and orders attempted to reviewed with pt. Pt is alert but noted continuous c/o "help me, I don't feel good. I'm hurting". PIIV not present at this time along with bloody sheet pos relate from IV removal. Staff bathed patient at this time. IV team consulted. Will continue to monitor.   Ave Filter, RN

## 2016-06-20 NOTE — Progress Notes (Signed)
Patient ID: TERRAN DIMITRIOU, female   DOB: [birth year], 82 y.o.   MRN: [id number] Ms. Gurganious is very well-known to me. I have been following her in the office with her C2 fracture which has been causing severe occipital headaches. She has been managed on opioid analgesics and is now in my pain management clinic receiving injection therapy and that may be the cause of some of the signal change in the posterior cervical musculature seen on the MRI.  It is a complex fracture and there is moderate stenosis at the craniocervical junction, but given her advanced age and bone quality I am not sure she is a candidate for C1-2 fusion or occipital cervical fusion. I'm afraid that the complication rate would be very high. I could consider a simple C1 laminectomy and even potentially a suboccipital decompression but I'm not sure how much further this would destabilize the segment. In the absence of adequate arthrodesis she is going to have to live in a semirigid orthosis ( cervical collar) the rest of her life. I am concerned about the risk of neurologic compromise without surgical decompression, and at this point I am unsure which is riskier... Surgery or no surgery. I will ask some of my partners for input, but I am quite sure opinions will vary given her advanced age and the complexity of such surgery. We will see if she gets better as her UTI is treated, and I will follow along while she is in the hospital

## 2016-06-20 NOTE — ED Notes (Signed)
Pt continues to call out and ask for pain meds. Pt nurse notified of pts request.

## 2016-06-20 NOTE — ED Notes (Signed)
Adult [place] on patient, for comfort.

## 2016-06-20 NOTE — ED Provider Notes (Signed)
10:01 AM Patient is a 82 year old female signed out to me by Dr. Claudine Mouton was a signout from Dr. Ellender Hose. Patient comes in with right upper arm and lower leg weakness. Patient had had UTI. Patient admitted to Dr. Alcario Drought. However the decision was made that she should be kept in the emergency department for an MRI prior to medical admission. MRI had to take emergent patients overnight so she just got her MRI this morning at 9 AM. MRI findings are significant for increasing spinal stenosis. Dr. Sherley Bounds (her neurosurgeon) was paged.  10:10 AM Dr. Ronnald Ramp informed and will continue to consult on patient however patient has not an ideal surgical candidate at this time. We'll page medicine again for admission.   Lindley Hiney Julio Alm, MD 06/20/16 1011

## 2016-06-20 NOTE — ED Notes (Signed)
Patient temperature changed in room.

## 2016-06-20 NOTE — Progress Notes (Signed)
SCDs ordered.  Ave Filter, RN

## 2016-06-20 NOTE — ED Notes (Signed)
Pt alert. C/o nausea and headache. Observed dry heaving. Vitals taken and medication administered for H/a and nausea. Fluids hung.

## 2016-06-20 NOTE — Plan of Care (Signed)
Admission deferred for the moment while patient gets MRI brain and C spine to evaluate for cord compression with R sided paralysis that has been worsening over past month, h/o C1 dens fracture a few months ago.  ED will call us back after MRI.

## 2016-06-21 ENCOUNTER — Encounter (HOSPITAL_COMMUNITY): Payer: Self-pay | Admitting: *Deleted

## 2016-06-21 DIAGNOSIS — L8990 Pressure ulcer of unspecified site, unspecified stage: Secondary | ICD-10-CM | POA: Insufficient documentation

## 2016-06-21 DIAGNOSIS — M4802 Spinal stenosis, cervical region: Principal | ICD-10-CM

## 2016-06-21 DIAGNOSIS — R319 Hematuria, unspecified: Secondary | ICD-10-CM

## 2016-06-21 DIAGNOSIS — R29898 Other symptoms and signs involving the musculoskeletal system: Secondary | ICD-10-CM

## 2016-06-21 DIAGNOSIS — I10 Essential (primary) hypertension: Secondary | ICD-10-CM

## 2016-06-21 DIAGNOSIS — N390 Urinary tract infection, site not specified: Secondary | ICD-10-CM

## 2016-06-21 DIAGNOSIS — L89152 Pressure ulcer of sacral region, stage 2: Secondary | ICD-10-CM

## 2016-06-21 DIAGNOSIS — N3000 Acute cystitis without hematuria: Secondary | ICD-10-CM

## 2016-06-21 LAB — BASIC METABOLIC PANEL
ANION GAP: 15 (ref 5–15)
BUN: 26 mg/dL — AB (ref 6–20)
CHLORIDE: 97 mmol/L — AB (ref 101–111)
CO2: 20 mmol/L — AB (ref 22–32)
Calcium: 8.9 mg/dL (ref 8.9–10.3)
Creatinine, Ser: 0.98 mg/dL (ref 0.44–1.00)
GFR calc Af Amer: 58 mL/min — ABNORMAL LOW (ref >60)
GFR, EST NON AFRICAN AMERICAN: 50 mL/min — AB (ref >60)
GLUCOSE: 148 mg/dL — AB (ref 65–99)
POTASSIUM: 4.1 mmol/L (ref 3.5–5.1)
Sodium: 132 mmol/L — ABNORMAL LOW (ref 135–145)

## 2016-06-21 LAB — CBC
HEMATOCRIT: 45.2 % (ref 36.0–46.0)
HEMOGLOBIN: 15 g/dL (ref 12.0–15.0)
MCH: 30.9 pg (ref 26.0–34.0)
MCHC: 33.2 g/dL (ref 30.0–36.0)
MCV: 93 fL (ref 78.0–100.0)
Platelets: 317 10*3/uL (ref 150–400)
RBC: 4.86 MIL/uL (ref 3.87–5.11)
RDW: 13.7 % (ref 11.5–15.5)
WBC: 10.8 10*3/uL — ABNORMAL HIGH (ref 4.0–10.5)

## 2016-06-21 MED ORDER — ENOXAPARIN SODIUM 30 MG/0.3ML ~~LOC~~ SOLN
30.0000 mg | SUBCUTANEOUS | Status: DC
  Administered 2016-06-22 – 2016-06-23 (×2): 30 mg via SUBCUTANEOUS
  Filled 2016-06-21 (×2): qty 0.3

## 2016-06-21 NOTE — Evaluation (Signed)
Physical Therapy Evaluation Patient Details Name: [name] MRN: [id number] DOB: [birth year] Today's Date: 06/21/2016   History of Present Illness  pt presents with C2 fx in hard collar with general decline at home.  pt with hx of falls, Anemia, HTN, Glaucoma, Macular Degeneration, multiple back surgeries, bil THRs, and Bil TKRs.    Clinical Impression  Pt overall quite debilitated and requires A for all aspects of mobility.  Pt with confusion during session needing orientation to situation and time, but was aware she was in "[facility]" hospital.  Feel pt will need SNF level of care at D/C for maximal safety.  Will continue to follow while on acute.      Follow Up Recommendations SNF    Equipment Recommendations  None recommended by PT    Recommendations for Other Services       Precautions / Restrictions Precautions Precautions: Fall;Cervical Required Braces or Orthoses: Cervical Brace Cervical Brace: Hard collar;At all times Restrictions Weight Bearing Restrictions: No      Mobility  Bed Mobility Overal bed mobility: Needs Assistance Bed Mobility: Supine to Sit     Supine to sit: Mod assist;HOB elevated     General bed mobility comments: pt participates well, but needs cues for technique and A with bringing trunk up to sitting and scooting to EOB.    Transfers Overall transfer level: Needs assistance Equipment used: 2 person hand held assist Transfers: Sit to/from [facility] Sit to Stand: Mod assist;+2 physical assistance Stand pivot transfers: Mod assist;+2 physical assistance       General transfer comment: pt needs cues for UE use and step-by-step through pivot to recliner on L side.  pt remains with flexed posture throughout mobility.    Ambulation/Gait                Stairs            Wheelchair Mobility    Modified Rankin (Stroke Patients Only)       Balance Overall balance assessment: Needs assistance;History of  Falls Sitting-balance support: Bilateral upper extremity supported;Single extremity supported;No upper extremity supported;Feet supported Sitting balance-Leahy Scale: Fair     Standing balance support: During functional activity Standing balance-Leahy Scale: Poor                               Pertinent Vitals/Pain Pain Assessment: Faces Faces Pain Scale: Hurts even more Pain Location: Neck with mobility Pain Descriptors / Indicators: Grimacing;Guarding Pain Intervention(s): Monitored during session;Premedicated before session;Repositioned    Home Living Family/patient expects to be discharged to:: Skilled nursing facility Living Arrangements: Spouse/significant other                    Prior Function Level of Independence: Needs assistance         Comments: Unclear level of A needed as pt with moments of confusion.       Hand Dominance   Dominant Hand: Right    Extremity/Trunk Assessment   Upper Extremity Assessment: Generalized weakness           Lower Extremity Assessment: Generalized weakness      Cervical / Trunk Assessment: Kyphotic  Communication   Communication: No difficulties  Cognition Arousal/Alertness: Awake/alert Behavior During Therapy: WFL for tasks assessed/performed Overall Cognitive Status: No family/caregiver present to determine baseline cognitive functioning  General Comments      Exercises     Assessment/Plan    PT Assessment Patient needs continued PT services  PT Problem List Decreased strength;Decreased activity tolerance;Decreased balance;Decreased mobility;Decreased coordination;Decreased cognition;Decreased knowledge of use of DME;Decreased safety awareness;Pain          PT Treatment Interventions DME instruction;Gait training;Functional mobility training;Therapeutic activities;Therapeutic exercise;Balance training;Neuromuscular re-education;Cognitive  remediation;Patient/family education    PT Goals (Current goals can be found in the Care Plan section)  Acute Rehab PT Goals Patient Stated Goal: pt did not state.   PT Goal Formulation: With patient Time For Goal Achievement: 07/05/16 Potential to Achieve Goals: Fair    Frequency Min 2X/week   Barriers to discharge        Co-evaluation               End of Session Equipment Utilized During Treatment: Gait belt;Cervical collar Activity Tolerance: Patient limited by fatigue;Patient limited by pain Patient left: in chair;with call bell/phone within reach;with chair alarm set Nurse Communication: Mobility status         Time: [id number] PT Time Calculation (min) (ACUTE ONLY): 16 min   Charges:   PT Evaluation $PT Eval Moderate Complexity: 1 Procedure     PT G CodesCatarina Hartshorn, [place] 06/21/2016, 2:01 PM

## 2016-06-21 NOTE — Care Management Note (Signed)
Case Management Note  Patient Details  Name: [name] MRN: [id number] Date of Birth: Jan 26, 1925  Subjective/Objective:   Pt in with spinal stenosis in cervical region. She is from home.                 Action/Plan: Neurosurgery consult. Awaiting PT/OT recommendations. CM following for discharge disposition.   Expected Discharge Date:                  Expected Discharge Plan:     In-House Referral:     Discharge planning Services     Post Acute Care Choice:    Choice offered to:     DME Arranged:    DME Agency:     HH Arranged:    HH Agency:     Status of Service:  In process, will continue to follow  If discussed at Long Length of Stay Meetings, dates discussed:    Additional Comments:  Pollie Friar, RN 06/21/2016, 10:41 AM

## 2016-06-21 NOTE — Progress Notes (Signed)
Patient ID: [name], female   DOB: 07-26-1925, 82 y.o.   MRN: [id number] Pain fairly well controlled at present, in collar, MAE pretty well to in bed exam, denies NT, was oob to potty chair

## 2016-06-21 NOTE — Progress Notes (Signed)
PROGRESS NOTE    [name]  [id number] DOB: 1924/09/16 DOA: 06/19/2016 PCP: Merrilee Seashore, MD   Brief Narrative:  [name] is a delightful 82 y.o. female with medical history significant  for hypertension, glaucoma, macular degeneration, chronic back pain, GERD, hypothyroidism, thyroid cancer, recent fall with right femoral neck fracture urgently repaired and odontoid fracture since to the emergency department with the chief complaint bilateral lower extremity weakness and right upper extremity weakness. Initial evaluation reveals UTI and MRI significant for increasing spinal stenosis.  Information is obtained from the patient. She states months ago she fell sustained several injuries one of which wasn't neck fracture. She states she will call her for a while and then was able to come out of that. Over the last several days she developed persistent worsening bilateral lower extremity weakness and right upper extremity weakness. Associated symptoms include decreased oral intake generalized weakness some nausea without emesis. She denies numbness or tingling of her extremities. She denies headache visual disturbances difficulty chewing or swallowing. She denies any abdominal pain diarrhea constipation melena. She denies dysuria hematuria frequency or urgency.  In the emergency department patient was afebrile hypertensive had an episode of tachycardia but was not hypoxic. She is provided with pain medicine on several occasions gentle IV fluids.    Assessment & Plan:   Principal Problem:   Spinal stenosis in cervical region Active Problems:   Hypertension   Anemia of unknown etiology   Hypothyroidism   Protein-calorie malnutrition, moderate (HCC)   GERD (gastroesophageal reflux disease)   Odontoid fracture (HCC)   Upper extremity weakness   UTI (urinary tract infection)   Bilateral leg weakness   Pressure injury of skin   #1. worsening spinal stenosis and  C-spine/history odontoid fracture/upper extremity weakness. MRI as noted above. Evaluated by neurosurgery opined not a good surgical candidate but will consult -Admit to medical bed -Continue cervical collar -Decadron 16 mg now -Decadron 10 mg daily starting tomorrow -Analgesia -PT -Monitor -Appreciate neurosurgery recommendations - per previous note Dr. Ronnald Ramp to discuss with partners course of treatment as risks present with many options  #2. Urinary tract infection. She had urinary retention 5 months ago after surgery with associated UTI as well. Denies dysuria. She is afebrile hemodynamically stable and nontoxic appearing -Gentle IV fluids -Continue Cipro initiated in the emergency department. Of note patient has allergy to Rocephin -Follow urine cultures- reincubated for better growth -Monitor urine output (at +660 so far)  #3. Bilateral lower extremity weakness/low back pain. Inability to ambulate over the last several days. History of chronic low back pain -PT -Continue home pain regimen - ambulated to bedside commode today  #4. Hypertension. Poor control in the emergency department. Home medications include Inderal spironolactone -Continue home meds -Monitor -Improved pain control - BP better controlled today  #5. Anemia. Likely of chronic disease. Hemoglobin 10. This appears to be her baseline. No signs symptoms of active bleeding -Anemia panel -Continue home meds  #. Hypothyroidism. -TSH pending -Continue home meds    DVT prophylaxis: lovenox  Code Status: limited  Family Communication: husband is bedside Disposition Plan: may need placement but unclear at this time Admission status: inpatient   Consultants:   Neurosurgery  Procedures:   none  Antimicrobials:   Ciprofloxacin   Subjective: Patient is in bed.  Eyes are closed.  Denies pain, denies nausea but states she is not hungry and does not want to eat.  Husband is bedside.  Patient is  oriented to place, person  and minimally to time (knows it is 2017 but did not know month or day).  She voices understanding of plan. Denies chest pain, chest pressure, shortness of breath, numbness or tingling.  Objective: Vitals:   06/20/16 1952 06/21/16 0106 06/21/16 0610 06/21/16 0940  BP: (!) 175/89 (!) 189/85 (!) 190/105 (!) 138/99  Pulse: 99 95 97 93  Resp: 20 20 20 20   Temp: 98.5 F (36.9 C) 98.2 F (36.8 C) 98.1 F (36.7 C) 98.5 F (36.9 C)  TempSrc: Oral Oral Oral Oral  SpO2: 97% 98% 99% 93%  Weight: 56.2 kg (124 lb)     Height:        Intake/Output Summary (Last 24 hours) at 06/21/16 1157 Last data filed at 06/21/16 0626  Gross per 24 hour  Intake              660 ml  Output                0 ml  Net              660 ml   Filed Weights   06/19/16 2004 06/20/16 1952  Weight: 62.1 kg (137 lb) 56.2 kg (124 lb)    Examination:  General exam: Appears calm and comfortable  Respiratory system: Clear to auscultation. Respiratory effort normal. Cardiovascular system: S1 & S2 heard, RRR. No JVD, murmurs, rubs, gallops or clicks. No pedal edema. Gastrointestinal system: Abdomen is nondistended, soft and nontender. No organomegaly or masses felt. Normal bowel sounds heard. Central nervous system: Alert and oriented x 3 (person, place and situation).  Extremities: able to move arms and legs spontaneously Skin: per wound nurse there is a Stage II sacral ulcer Psychiatry: appropriate mood and affect    Data Reviewed: I have personally reviewed following labs and imaging studies  CBC:  Recent Labs Lab 06/19/16 2201 06/21/16 0455  WBC 5.1 10.8*  NEUTROABS 3.8  --   HGB 10.6* 15.0  HCT 32.6* 45.2  MCV 94.2 93.0  PLT 227 [id number]   Basic Metabolic Panel:  Recent Labs Lab 06/19/16 2201 06/21/16 0455  NA 136 132*  K 4.2 4.1  CL 102 97*  CO2 25 20*  GLUCOSE 95 148*  BUN 16 26*  CREATININE 0.80 0.98  CALCIUM 8.7* 8.9   GFR: Estimated Creatinine Clearance: 29.9  mL/min (by C-G formula based on SCr of 0.98 mg/dL). Liver Function Tests:  Recent Labs Lab 06/19/16 2201  AST 16  ALT 12*  ALKPHOS 74  BILITOT 0.8  PROT 5.8*  ALBUMIN 3.7   No results for input(s): LIPASE, AMYLASE in the last 168 hours. No results for input(s): AMMONIA in the last 168 hours. Coagulation Profile: No results for input(s): INR, PROTIME in the last 168 hours. Cardiac Enzymes:  Recent Labs Lab 06/19/16 2201  CKTOTAL 88   BNP (last 3 results) No results for input(s): PROBNP in the last 8760 hours. HbA1C: No results for input(s): HGBA1C in the last 72 hours. CBG: No results for input(s): GLUCAP in the last 168 hours. Lipid Profile: No results for input(s): CHOL, HDL, LDLCALC, TRIG, CHOLHDL, LDLDIRECT in the last 72 hours. Thyroid Function Tests: No results for input(s): TSH, T4TOTAL, FREET4, T3FREE, THYROIDAB in the last 72 hours. Anemia Panel:  Recent Labs  06/20/16 1121  VITAMINB12 1,098*  FOLATE 22.4  FERRITIN 194  TIBC 302  IRON 30  RETICCTPCT 0.8   Sepsis Labs: No results for input(s): PROCALCITON, LATICACIDVEN in the last 168 hours.  Recent  Results (from the past 240 hour(s))  Urine culture     Status: None (Preliminary result)   Collection Time: 06/19/16  9:10 PM  Result Value Ref Range Status   Specimen Description URINE, CLEAN CATCH  Final   Special Requests Normal  Final   Culture CULTURE REINCUBATED FOR BETTER GROWTH  Final   Report Status PENDING  Incomplete         Radiology Studies: Ct Head Wo Contrast  Result Date: 06/19/2016 CLINICAL DATA:  Acute onset of right-sided headache, radiating down the right side of the neck. High blood pressure. Initial encounter. EXAM: CT HEAD WITHOUT CONTRAST CT CERVICAL SPINE WITHOUT CONTRAST TECHNIQUE: Multidetector CT imaging of the head and cervical spine was performed following the standard protocol without intravenous contrast. Multiplanar CT image reconstructions of the cervical spine  were also generated. COMPARISON:  CT of the head performed 04/25/2016, and CT of the cervical spine performed 03/29/2016. MRI of the cervical spine performed 05/16/2016 FINDINGS: CT HEAD FINDINGS Brain: No evidence of acute infarction, hemorrhage, hydrocephalus, extra-axial collection or mass lesion/mass effect. Prominence of the ventricles sulci reflects mild cortical volume loss. Scattered periventricular white matter change likely reflects small vessel ischemic microangiopathy. The brainstem and fourth ventricle are within normal limits. The basal ganglia are unremarkable in appearance. The cerebral hemispheres demonstrate grossly normal gray-white differentiation. No mass effect or midline shift is seen. Vascular: No hyperdense vessel or unexpected calcification. Skull: There is no evidence of fracture; visualized osseous structures are unremarkable in appearance. Sinuses/Orbits: The orbits are within normal limits. The paranasal sinuses and mastoid air cells are well-aerated. Other: No significant soft tissue abnormalities are seen. CT CERVICAL SPINE FINDINGS Alignment: Normal. Skull base and vertebrae: No acute fracture. No primary bone lesion or focal pathologic process. There is a chronic relatively stable fracture of the upper dens, grossly unchanged from the prior studies. Soft tissues and spinal canal: No prevertebral fluid or swelling. No visible canal hematoma. Chronic central canal stenosis at C1-C2 reflects the fracture described above. Disc levels: Multilevel disc space narrowing is noted, with loss of the disc spaces at C2-C4, reflecting some degree of osseous fusion. Scattered anterior and posterior disc osteophyte complexes are noted along the cervical and upper thoracic spine. Upper chest: Minimal scarring is noted at the lung apices. The patient is status post thyroidectomy. Other: No additional soft tissue abnormalities are seen. IMPRESSION: 1. No evidence of traumatic intracranial injury or  fracture. 2. No evidence of fracture or subluxation along the cervical spine. 3. Mild cortical volume loss and scattered small vessel ischemic microangiopathy. 4. Diffuse degenerative change along the cervical spine, with chronic osseous fusion at C2-C4. 5. Chronic relatively stable fracture of the upper dens, with underlying chronic central canal stenosis, better characterized on prior MRI. 6. Minimal scarring at the lung apices. Electronically Signed   By: Garald Balding M.D.   On: 06/19/2016 20:51   Ct Cervical Spine Wo Contrast  Result Date: 06/19/2016 CLINICAL DATA:  Acute onset of right-sided headache, radiating down the right side of the neck. High blood pressure. Initial encounter. EXAM: CT HEAD WITHOUT CONTRAST CT CERVICAL SPINE WITHOUT CONTRAST TECHNIQUE: Multidetector CT imaging of the head and cervical spine was performed following the standard protocol without intravenous contrast. Multiplanar CT image reconstructions of the cervical spine were also generated. COMPARISON:  CT of the head performed 04/25/2016, and CT of the cervical spine performed 03/29/2016. MRI of the cervical spine performed 05/16/2016 FINDINGS: CT HEAD FINDINGS Brain: No evidence of acute  infarction, hemorrhage, hydrocephalus, extra-axial collection or mass lesion/mass effect. Prominence of the ventricles sulci reflects mild cortical volume loss. Scattered periventricular white matter change likely reflects small vessel ischemic microangiopathy. The brainstem and fourth ventricle are within normal limits. The basal ganglia are unremarkable in appearance. The cerebral hemispheres demonstrate grossly normal gray-white differentiation. No mass effect or midline shift is seen. Vascular: No hyperdense vessel or unexpected calcification. Skull: There is no evidence of fracture; visualized osseous structures are unremarkable in appearance. Sinuses/Orbits: The orbits are within normal limits. The paranasal sinuses and mastoid air cells  are well-aerated. Other: No significant soft tissue abnormalities are seen. CT CERVICAL SPINE FINDINGS Alignment: Normal. Skull base and vertebrae: No acute fracture. No primary bone lesion or focal pathologic process. There is a chronic relatively stable fracture of the upper dens, grossly unchanged from the prior studies. Soft tissues and spinal canal: No prevertebral fluid or swelling. No visible canal hematoma. Chronic central canal stenosis at C1-C2 reflects the fracture described above. Disc levels: Multilevel disc space narrowing is noted, with loss of the disc spaces at C2-C4, reflecting some degree of osseous fusion. Scattered anterior and posterior disc osteophyte complexes are noted along the cervical and upper thoracic spine. Upper chest: Minimal scarring is noted at the lung apices. The patient is status post thyroidectomy. Other: No additional soft tissue abnormalities are seen. IMPRESSION: 1. No evidence of traumatic intracranial injury or fracture. 2. No evidence of fracture or subluxation along the cervical spine. 3. Mild cortical volume loss and scattered small vessel ischemic microangiopathy. 4. Diffuse degenerative change along the cervical spine, with chronic osseous fusion at C2-C4. 5. Chronic relatively stable fracture of the upper dens, with underlying chronic central canal stenosis, better characterized on prior MRI. 6. Minimal scarring at the lung apices. Electronically Signed   By: Garald Balding M.D.   On: 06/19/2016 20:51   Mr Brain Wo Contrast  Addendum Date: 06/20/2016   ADDENDUM REPORT: 06/20/2016 09:52 ADDENDUM: Critical Value/emergent results were called by telephone at the time of interpretation on 06/20/2016 at 0935 hours to ED PA Alecia Lemming, who verbally acknowledged these results. Electronically Signed   By: Genevie Ann M.D.   On: 06/20/2016 09:52   Result Date: 06/20/2016 CLINICAL DATA:  82 year old female with progressive right side weakness/paralysis x1 month. C1  vertebral and Type 2 dens fractures sustained in May with subsequent C1-C2 spinal stenosis. Initial encounter. EXAM: MRI HEAD WITHOUT CONTRAST MRI CERVICAL SPINE WITHOUT CONTRAST TECHNIQUE: Multiplanar, multiecho pulse sequences of the brain and surrounding structures, and cervical spine, to include the craniocervical junction and cervicothoracic junction, were obtained without intravenous contrast. COMPARISON:  Head and cervical spine CT 06/19/2016 and earlier. Cervical spine MRI 05/16/2016. FINDINGS: MRI HEAD FINDINGS Brain: No restricted diffusion to suggest acute infarction. No midline shift, mass effect, evidence of mass lesion, ventriculomegaly, extra-axial collection or acute intracranial hemorrhage. Pituitary within normal limits. [place] Cables and white matter signal is within normal limits for age throughout the brain. No abnormal signal in the brainstem. Vascular: Major intracranial vascular flow voids appear within normal limits. Skull and upper cervical spine: Reported below. Sinuses/Orbits: Postoperative changes the globes, otherwise negative orbits soft tissues. Visualized paranasal sinuses and mastoids are stable and well pneumatized. Other: Visible internal auditory structures appear normal. Negative scalp soft tissues. MRI CERVICAL SPINE FINDINGS Study is intermittently degraded by motion artifact despite repeated imaging attempts. Alignment: C2 through C4 interbody and posterior element ankylosis. Superimposed left side C4-C5 posterior element ankylosis and C5-C6 interbody ankylosis. Subsequently,  the cervical spine is essentially fused from the C2 body to the C6 level. Alignment of this fused segments is stable over these series of exams. Un healed type 2 odontoid fracture persists as seen on the CT yesterday. Vertebrae:   No new osseous abnormality identified. Cord: Ligamentous hypertrophy about the odontoid and especially posterior ligamentous hypertrophy about the posterior C1 ring again noted and  combine for moderate spinal stenosis at the C1-C2 level (series 16, image 11 and series 14, image 7) which appears mildly progressed since September. Subsequent moderate mass effect on the spinal cord in the AP direction (series 16, image 11). No definite cord edema. Below C1-C2 there is additional multilevel cervical spinal stenosis. No definite cord signal abnormality at those levels. Visualized upper thoracic spinal cord appears within normal limits. Posterior Fossa, vertebral arteries, paraspinal tissues: Posterior fossa detailed above. New since 05/16/2016 abnormal increased prevertebral confluent T2 and STIR hyperintensity from the skullbase to C6-C7 (series 14, image 5). New confluent abnormal posterior paraspinal muscle edema (seen in the midline on also series 14, image 5). Other bilateral neck soft tissues appear stable. Preserved major vascular flow voids. Partially retropharyngeal course of both carotids. Disc levels: Degenerative multifactorial spinal stenosis at C2-C3, C3-C4, C4-C5, C5-C6, and C6-C7 appears stable since September. IMPRESSION: 1. Mild further progression since September of moderate C1-C2 spinal stenosis with upper cervical cord compression due to ligamentous hypertrophy and mild bony displacement about the un-healed type 2 odontoid fracture. 2. No definite cervical spinal cord edema or signal abnormality. Multifactorial degenerative spinal stenosis also from C2-C3 through C6-C7 appears stable since September, including some lesser cord mass effect. 3. New diffuse abnormal signal in the posterior paraspinal muscles. If there has been no interval trauma then perhaps this reflects widespread a Muscle Denervation Injury related to the progressive compressive myelopathy. 4. Increased abnormal prevertebral soft tissue edema also noted, nonspecific. 5. Underlying chronic cervical spine ankylosis from the C2 body to the C6 vertebra. 6. No acute intracranial abnormality. Normal for age  noncontrast MRI appearance of the brain. Electronically Signed: By: Genevie Ann M.D. On: 06/20/2016 09:30   Mr Cervical Spine Wo Contrast  Addendum Date: 06/20/2016   ADDENDUM REPORT: 06/20/2016 09:52 ADDENDUM: Critical Value/emergent results were called by telephone at the time of interpretation on 06/20/2016 at 0935 hours to ED PA Alecia Lemming, who verbally acknowledged these results. Electronically Signed   By: Genevie Ann M.D.   On: 06/20/2016 09:52   Result Date: 06/20/2016 CLINICAL DATA:  82 year old female with progressive right side weakness/paralysis x1 month. C1 vertebral and Type 2 dens fractures sustained in May with subsequent C1-C2 spinal stenosis. Initial encounter. EXAM: MRI HEAD WITHOUT CONTRAST MRI CERVICAL SPINE WITHOUT CONTRAST TECHNIQUE: Multiplanar, multiecho pulse sequences of the brain and surrounding structures, and cervical spine, to include the craniocervical junction and cervicothoracic junction, were obtained without intravenous contrast. COMPARISON:  Head and cervical spine CT 06/19/2016 and earlier. Cervical spine MRI 05/16/2016. FINDINGS: MRI HEAD FINDINGS Brain: No restricted diffusion to suggest acute infarction. No midline shift, mass effect, evidence of mass lesion, ventriculomegaly, extra-axial collection or acute intracranial hemorrhage. Pituitary within normal limits. [place] Cables and white matter signal is within normal limits for age throughout the brain. No abnormal signal in the brainstem. Vascular: Major intracranial vascular flow voids appear within normal limits. Skull and upper cervical spine: Reported below. Sinuses/Orbits: Postoperative changes the globes, otherwise negative orbits soft tissues. Visualized paranasal sinuses and mastoids are stable and well pneumatized. Other: Visible internal auditory structures appear  normal. Negative scalp soft tissues. MRI CERVICAL SPINE FINDINGS Study is intermittently degraded by motion artifact despite repeated imaging attempts.  Alignment: C2 through C4 interbody and posterior element ankylosis. Superimposed left side C4-C5 posterior element ankylosis and C5-C6 interbody ankylosis. Subsequently, the cervical spine is essentially fused from the C2 body to the C6 level. Alignment of this fused segments is stable over these series of exams. Un healed type 2 odontoid fracture persists as seen on the CT yesterday. Vertebrae:   No new osseous abnormality identified. Cord: Ligamentous hypertrophy about the odontoid and especially posterior ligamentous hypertrophy about the posterior C1 ring again noted and combine for moderate spinal stenosis at the C1-C2 level (series 16, image 11 and series 14, image 7) which appears mildly progressed since September. Subsequent moderate mass effect on the spinal cord in the AP direction (series 16, image 11). No definite cord edema. Below C1-C2 there is additional multilevel cervical spinal stenosis. No definite cord signal abnormality at those levels. Visualized upper thoracic spinal cord appears within normal limits. Posterior Fossa, vertebral arteries, paraspinal tissues: Posterior fossa detailed above. New since 05/16/2016 abnormal increased prevertebral confluent T2 and STIR hyperintensity from the skullbase to C6-C7 (series 14, image 5). New confluent abnormal posterior paraspinal muscle edema (seen in the midline on also series 14, image 5). Other bilateral neck soft tissues appear stable. Preserved major vascular flow voids. Partially retropharyngeal course of both carotids. Disc levels: Degenerative multifactorial spinal stenosis at C2-C3, C3-C4, C4-C5, C5-C6, and C6-C7 appears stable since September. IMPRESSION: 1. Mild further progression since September of moderate C1-C2 spinal stenosis with upper cervical cord compression due to ligamentous hypertrophy and mild bony displacement about the un-healed type 2 odontoid fracture. 2. No definite cervical spinal cord edema or signal abnormality.  Multifactorial degenerative spinal stenosis also from C2-C3 through C6-C7 appears stable since September, including some lesser cord mass effect. 3. New diffuse abnormal signal in the posterior paraspinal muscles. If there has been no interval trauma then perhaps this reflects widespread a Muscle Denervation Injury related to the progressive compressive myelopathy. 4. Increased abnormal prevertebral soft tissue edema also noted, nonspecific. 5. Underlying chronic cervical spine ankylosis from the C2 body to the C6 vertebra. 6. No acute intracranial abnormality. Normal for age noncontrast MRI appearance of the brain. Electronically Signed: By: Genevie Ann M.D. On: 06/20/2016 09:30        Scheduled Meds: . brimonidine  1 drop Both Eyes BID  . ciprofloxacin  400 mg Intravenous Q12H  . dexamethasone  10 mg Intravenous Daily  . dorzolamide-timolol  1 drop Both Eyes BID  . enoxaparin (LOVENOX) injection  40 mg Subcutaneous Q24H  . latanoprost  1 drop Both Eyes QHS  . levothyroxine  137 mcg Oral QAC breakfast  . oxyCODONE  10 mg Oral Q12H  . propranolol  20 mg Oral Daily  . spironolactone  25 mg Oral BID   Continuous Infusions:    LOS: 1 day    Time spent: 35 minutes    Newman Pies, MD [facility] Pager [phone]  If 7PM-7AM, please contact night-coverage [email or web address] Password [email or web address] 06/21/2016, 11:57 AM

## 2016-06-21 NOTE — Clinical Social Work Note (Signed)
CSW met briefly with pt's husband this morning to address consult. PT was consult was pending at time. However, at time of note, PT is recommending SNF. CSW will complete full assessment for SNF placement in the morning. CSW will continue to follow.   Darden Dates, MSW, LCSW Clinical Social Worker [phone]

## 2016-06-21 NOTE — Consult Note (Signed)
[place] Nurse wound consult note Reason for Consult: sacral ulcer Wound type: stage II Pressure Ulcer POA: Yes Measurement: 10cm x 7cm x 0cm Wound bed: 100% pink Drainage (amount, consistency, odor) none Periwound: intact Dressing procedure/placement/frequency: I have provided nurses with orders for Cleanse with NS, pat dry, apply foam for protection, change prn. We will not follow, but will remain available to this patient, to nursing, and the medical and/or surgical teams.  Please re-consult if we need to assist further.    Fara Olden, RN-C, WTA-C Wound Treatment Associate

## 2016-06-22 DIAGNOSIS — E440 Moderate protein-calorie malnutrition: Secondary | ICD-10-CM

## 2016-06-22 DIAGNOSIS — K219 Gastro-esophageal reflux disease without esophagitis: Secondary | ICD-10-CM

## 2016-06-22 LAB — TSH: TSH: 0.025 u[IU]/mL — AB (ref 0.350–4.500)

## 2016-06-22 MED ORDER — CIPROFLOXACIN IN D5W 400 MG/200ML IV SOLN
400.0000 mg | INTRAVENOUS | Status: DC
  Administered 2016-06-23: 400 mg via INTRAVENOUS
  Filled 2016-06-22: qty 200

## 2016-06-22 NOTE — Evaluation (Signed)
Occupational Therapy Evaluation Patient Details Name: [name] MRN: [id number] DOB: 05-Aug-1925 Today's Date: 06/22/2016    History of Present Illness pt presents with C2 fx in hard collar with general decline at home.  pt with hx of falls, Anemia, HTN, Glaucoma, Macular Degeneration, multiple back surgeries, bil THRs, and Bil TKRs.     Clinical Impression   Pt reports she was independent with ADL PTA; unsure of accuracy due to impaired cognition. Pt currently requires mod assist +2 for functional mobility and mod-max assist for ADL with max verbal cues for sequencing and safety. Pt presenting with impaired cognition, RUE weakness and decreased coordination, poor safety awareness and insight into deficits impacting her independence and safety with ADL and functional mobility. Recommending SNF for follow up to maximize independence and safety with ADL and functional mobility prior to return home. Pt would benefit from continued skilled OT to address established goals.    Follow Up Recommendations  SNF;Supervision/Assistance - 24 hour    Equipment Recommendations  Other (comment) (TBD at next venue)    Recommendations for Other Services       Precautions / Restrictions Precautions Precautions: Fall;Cervical Required Braces or Orthoses: Cervical Brace Cervical Brace: Hard collar;At all times Restrictions Weight Bearing Restrictions: No      Mobility Bed Mobility Overal bed mobility: Needs Assistance Bed Mobility: Supine to Sit     Supine to sit: Min assist;+2 for physical assistance;HOB elevated     General bed mobility comments: cues for sequencing; assist to elevate trunk, scoot hips to EOB with use of bed pad, and for trunk stabilization upon sitting  Transfers Overall transfer level: Needs assistance Equipment used: 2 person hand held assist;Rolling walker (2 wheeled) Transfers: Sit to/from Stand Sit to Stand: Min assist;+2 physical assistance          General transfer comment: cues for safe hand placement; X2 from EOB; first trial with RW and second with +2 HHA to see if AD was creating increased neck pain; pt reported pain as the same with/without AD; pt unsteady in standing with postural sway    Balance Overall balance assessment: Needs assistance Sitting-balance support: Feet supported;Bilateral upper extremity supported Sitting balance-Leahy Scale: Fair     Standing balance support: Bilateral upper extremity supported Standing balance-Leahy Scale: Poor                              ADL Overall ADL's : Needs assistance/impaired     Grooming: Minimal assistance;Sitting;Cueing for sequencing;Cueing for safety   Upper Body Bathing: Moderate assistance;Sitting   Lower Body Bathing: Maximal assistance;Sit to/from stand   Upper Body Dressing : Moderate assistance;Sitting   Lower Body Dressing: Maximal assistance;Sit to/from stand   Toilet Transfer: Moderate assistance;+2 for physical assistance;Ambulation;BSC;RW Toilet Transfer Details (indicate cue type and reason): Simulated by sit to stand from EOB with functional mobility in room. Toileting- Clothing Manipulation and Hygiene: Maximal assistance;Sit to/from stand       Functional mobility during ADLs: Moderate assistance;+2 for physical assistance;Rolling walker;Cueing for sequencing;Cueing for safety General ADL Comments: Pt with poor attention to task and requires consistent cues for sequencing and safety. Pt very unsteady in sitting and standing with poor truncal control.     Vision Additional Comments: Difficult to assess due to impaired cognition.   Perception     Praxis      Pertinent Vitals/Pain Pain Assessment: Faces Faces Pain Scale: Hurts little more Pain Location: neck  with mobility Pain Descriptors / Indicators: Grimacing;Guarding;Sore Pain Intervention(s): Monitored during session;Limited activity within patient's tolerance;Repositioned      Hand Dominance Right   Extremity/Trunk Assessment Upper Extremity Assessment Upper Extremity Assessment: RUE deficits/detail RUE Deficits / Details: Grossly 3+/5. Noted gross motor coordination impairments. Pt with difficulty perfroming full AROM (0-90). Pt reports no sensation changes. RUE Coordination: decreased gross motor   Lower Extremity Assessment Lower Extremity Assessment: Defer to PT evaluation   Cervical / Trunk Assessment Cervical / Trunk Assessment: Kyphotic   Communication Communication Communication: No difficulties   Cognition Arousal/Alertness: Awake/alert Behavior During Therapy: Impulsive Overall Cognitive Status: No family/caregiver present to determine baseline cognitive functioning Area of Impairment: Orientation;Safety/judgement;Problem solving Orientation Level: Disoriented to;Situation       Safety/Judgement: Decreased awareness of safety;Decreased awareness of deficits   Problem Solving: Requires verbal cues General Comments: Pt feels she can return home at this time even when discussing level of assist she currently needs. Seems unaware of physical deficits.   General Comments       Exercises       Shoulder Instructions      Home Living Family/patient expects to be discharged to:: Private residence Living Arrangements: Spouse/significant other Available Help at Discharge: Family;Available 24 hours/day Type of Home: House Home Access: Stairs to enter [employer] of Steps: 3 Entrance Stairs-Rails: Left;Right;Can reach both Home Layout: Able to live on main level with bedroom/bathroom     Bathroom Shower/Tub: Occupational psychologist: Standard Bathroom Accessibility: Yes How Accessible: Accessible via walker Home Equipment: Independence - 4 wheels;Cane - single point;Bedside commode;Grab bars - toilet;Grab bars - tub/shower;Shower seat   Additional Comments: Home set up information taken from prior hospital  admission.      Prior Functioning/Environment Level of Independence: Independent with assistive device(s)        Comments: Pt reports she was independent with ADL PTA and used a RW for mobility. Unsure of accuracy of information provided by pt and no family present at this time to confirm.        OT Problem List: Decreased strength;Decreased range of motion;Decreased activity tolerance;Impaired balance (sitting and/or standing);Decreased coordination;Decreased cognition;Decreased safety awareness;Decreased knowledge of use of DME or AE;Decreased knowledge of precautions;Impaired UE functional use;Pain;Increased edema   OT Treatment/Interventions: Self-care/ADL training;Therapeutic exercise;Neuromuscular education;Energy conservation;DME and/or AE instruction;Therapeutic activities;Cognitive remediation/compensation;Patient/family education;Balance training    OT Goals(Current goals can be found in the care plan section) Acute Rehab OT Goals Patient Stated Goal: go home OT Goal Formulation: With patient Time For Goal Achievement: 07/06/16 Potential to Achieve Goals: Good ADL Goals Pt Will Perform Grooming: with min guard assist;standing Pt Will Transfer to Toilet: with min guard assist;ambulating;bedside commode Pt Will Perform Toileting - Clothing Manipulation and hygiene: with min guard assist;sit to/from stand Additional ADL Goal #1: Pt will follow 2 step command in a minimally distracting environment.  OT Frequency: Min 2X/week   Barriers to D/C:            Co-evaluation PT/OT/SLP Co-Evaluation/Treatment: Yes Reason for Co-Treatment: Complexity of the patient's impairments (multi-system involvement);For patient/therapist safety PT goals addressed during session: Mobility/safety with mobility;Balance OT goals addressed during session: ADL's and self-care      End of Session Equipment Utilized During Treatment: Gait belt;Rolling walker Nurse Communication: Mobility  status  Activity Tolerance: Patient tolerated treatment well Patient left: in chair;with call bell/phone within reach;with chair alarm set   Time: 1031-1054 OT Time Calculation (min): 23 min Charges:  OT General Charges $OT Visit: 1  Procedure OT Evaluation $OT Eval Moderate Complexity: 1 Procedure G-Codes:     Binnie Kand M.S., OTR/L Pager: [phone]  06/22/2016, 1:59 PM

## 2016-06-22 NOTE — Progress Notes (Signed)
PROGRESS NOTE    NEEYA SEVERT  [id number] DOB: August 29, 1925 DOA: 06/19/2016 PCP: Merrilee Seashore, MD   Brief Narrative:  HADELYN PRETE is a delightful 82 y.o. female with medical history significant  for hypertension, glaucoma, macular degeneration, chronic back pain, GERD, hypothyroidism, thyroid cancer, recent fall with right femoral neck fracture urgently repaired and odontoid fracture since to the emergency department with the chief complaint bilateral lower extremity weakness and right upper extremity weakness. Initial evaluation reveals UTI and MRI significant for increasing spinal stenosis.  Information is obtained from the patient. She states months ago she fell sustained several injuries one of which wasn't neck fracture. She states she will call her for a while and then was able to come out of that. Over the last several days she developed persistent worsening bilateral lower extremity weakness and right upper extremity weakness. Associated symptoms include decreased oral intake generalized weakness some nausea without emesis. She denies numbness or tingling of her extremities. She denies headache visual disturbances difficulty chewing or swallowing. She denies any abdominal pain diarrhea constipation melena. She denies dysuria hematuria frequency or urgency.  In the emergency department patient was afebrile hypertensive had an episode of tachycardia but was not hypoxic. She is provided with pain medicine on several occasions and gentle IV fluids.    Assessment & Plan:   Principal Problem:   Spinal stenosis in cervical region Active Problems:   Hypertension   Anemia of unknown etiology   Hypothyroidism   Protein-calorie malnutrition, moderate (HCC)   GERD (gastroesophageal reflux disease)   Odontoid fracture (HCC)   Upper extremity weakness   UTI (urinary tract infection)   Bilateral leg weakness   Pressure injury of skin   Acute cystitis without  hematuria   Worsening spinal stenosis and C-spine/history odontoid fracture/upper extremity weakness. MRI as noted above. Evaluated by neurosurgery opined not a good surgical candidate but will consult -Admit to medical bed -Continue cervical collar -Decadron 16 mg now -Decadron 10 mg daily starting yesterday -Analgesia -PT -Monitor -Appreciate neurosurgery recommendations - patient will likely require cervical collar for rest of her life  Urinary tract infection. She had urinary retention 5 months ago after surgery with associated UTI as well. Denies dysuria. She is afebrile hemodynamically stable and nontoxic appearing -Continue Cipro initiated in the emergency department. Of note patient has allergy to Rocephin -Follow urine cultures- >100k colonies of klebsiella and e. Coli - await sensitivities   Bilateral lower extremity weakness/low back pain. Inability to ambulate over the last several days. History of chronic low back pain -PT -Continue home pain regimen - ambulated to bedside commode today - will need SNF for rehab at time of discharge  Hypertension. Poor control in the emergency department. Home medications include Inderal spironolactone -Continue home meds -Monitor -Improved pain control - BP better controlled today  Anemia. Likely of chronic disease. Hemoglobin 10. This appears to be her baseline. No signs symptoms of active bleeding -Anemia panel -Continue home meds  Hypothyroidism. -TSH pending -Continue home meds    DVT prophylaxis: lovenox  Code Status: limited  Family Communication: no family bedside Disposition Plan: may need placement but unclear at this time Admission status: inpatient   Consultants:   Neurosurgery  Procedures:   none  Antimicrobials:   Ciprofloxacin   Subjective: Patient laying down in bed.  Previously seen working with PT.  Patient does not understand why she needs SNF at discharge.  She says repeatedly that  she feels that she is capable of going  home.  She does mention to me that she is concerned about cost of rehab.  Discussed Dr. Adah Salvage recommendations.  Objective: Vitals:   06/22/16 0042 06/22/16 0552 06/22/16 1015 06/22/16 1426  BP: (!) 159/68 (!) 162/89 (!) 158/70 (!) 160/76  Pulse: 94 (!) 107 92 89  Resp: 20 20 20 20   Temp: 98.7 F (37.1 C) 98.9 F (37.2 C) 98.4 F (36.9 C) 98.6 F (37 C)  TempSrc: Oral Oral Oral Oral  SpO2: 97% 98% 98% 98%  Weight:      Height:       No intake or output data in the 24 hours ending 06/22/16 1602 Filed Weights   06/19/16 2004 06/20/16 1952  Weight: 62.1 kg (137 lb) 56.2 kg (124 lb)    Examination:  General exam: Appears calm and comfortable  Respiratory system: Clear to auscultation. Respiratory effort normal. Cardiovascular system: S1 & S2 heard, RRR. No JVD, murmurs, rubs, gallops or clicks. No pedal edema. Gastrointestinal system: Abdomen is nondistended, soft and nontender. No organomegaly or masses felt. Normal bowel sounds heard. Central nervous system: Alert and oriented x 3 (person, place and situation).  Extremities: able to move arms and legs spontaneously Skin: per wound nurse there is a Stage II sacral ulcer Psychiatry: appropriate mood and affect but limited insight    Data Reviewed: I have personally reviewed following labs and imaging studies  CBC:  Recent Labs Lab 06/19/16 2201 06/21/16 0455  WBC 5.1 10.8*  NEUTROABS 3.8  --   HGB 10.6* 15.0  HCT 32.6* 45.2  MCV 94.2 93.0  PLT 227 [id number]   Basic Metabolic Panel:  Recent Labs Lab 06/19/16 2201 06/21/16 0455  NA 136 132*  K 4.2 4.1  CL 102 97*  CO2 25 20*  GLUCOSE 95 148*  BUN 16 26*  CREATININE 0.80 0.98  CALCIUM 8.7* 8.9   GFR: Estimated Creatinine Clearance: 29.9 mL/min (by C-G formula based on SCr of 0.98 mg/dL). Liver Function Tests:  Recent Labs Lab 06/19/16 2201  AST 16  ALT 12*  ALKPHOS 74  BILITOT 0.8  PROT 5.8*  ALBUMIN 3.7   No  results for input(s): LIPASE, AMYLASE in the last 168 hours. No results for input(s): AMMONIA in the last 168 hours. Coagulation Profile: No results for input(s): INR, PROTIME in the last 168 hours. Cardiac Enzymes:  Recent Labs Lab 06/19/16 2201  CKTOTAL 88   BNP (last 3 results) No results for input(s): PROBNP in the last 8760 hours. HbA1C: No results for input(s): HGBA1C in the last 72 hours. CBG: No results for input(s): GLUCAP in the last 168 hours. Lipid Profile: No results for input(s): CHOL, HDL, LDLCALC, TRIG, CHOLHDL, LDLDIRECT in the last 72 hours. Thyroid Function Tests: No results for input(s): TSH, T4TOTAL, FREET4, T3FREE, THYROIDAB in the last 72 hours. Anemia Panel:  Recent Labs  06/20/16 1121  VITAMINB12 1,098*  FOLATE 22.4  FERRITIN 194  TIBC 302  IRON 30  RETICCTPCT 0.8   Sepsis Labs: No results for input(s): PROCALCITON, LATICACIDVEN in the last 168 hours.  Recent Results (from the past 240 hour(s))  Urine culture     Status: Abnormal (Preliminary result)   Collection Time: 06/19/16  9:10 PM  Result Value Ref Range Status   Specimen Description URINE, CLEAN CATCH  Final   Special Requests Normal  Final   Culture (A)  Final    >=100,000 COLONIES/mL KLEBSIELLA PNEUMONIAE >=100,000 COLONIES/mL ESCHERICHIA COLI    Report Status PENDING  Incomplete  Radiology Studies: No results found.      Scheduled Meds: . brimonidine  1 drop Both Eyes BID  . ciprofloxacin  400 mg Intravenous Q12H  . dexamethasone  10 mg Intravenous Daily  . dorzolamide-timolol  1 drop Both Eyes BID  . enoxaparin (LOVENOX) injection  30 mg Subcutaneous Q24H  . latanoprost  1 drop Both Eyes QHS  . levothyroxine  137 mcg Oral QAC breakfast  . oxyCODONE  10 mg Oral Q12H  . propranolol  20 mg Oral Daily  . spironolactone  25 mg Oral BID   Continuous Infusions:    LOS: 2 days    Time spent: 35 minutes    Newman Pies, MD [facility] Pager  [phone]  If 7PM-7AM, please contact night-coverage [email or web address] Password [email or web address] 06/22/2016, 4:02 PM

## 2016-06-22 NOTE — Progress Notes (Signed)
Patient ID: [name], female   DOB: 04-19-1925, 82 y.o.   MRN: [id number] She looks the best today I have seen her. She complains of very little discomfort. She is moving her arms and legs well. Her grips are equal. Negative Hoffmann's. She is not hyperreflexic.  I have gone over her CT scan and her MRI once again. I am just not convinced that there is any surgery that I can perform to make this situation better. I do not believe a C1 laminectomy and suboccipital decompression without instrumented fusion would be successful, though this would likely carry the lowest morbidity and mortality. It would address the stenosis but not the instability and fracture. I do not believe her lateral mass at C1 on the right would accept a lateral mass screw and therefore C1-2 lateral mass fixation would be extremely difficult and would have a high risk of failure. I do not believe she is a candidate for transarticular screws from an anatomical standpoint. She is not a candidate for a [place] fusion because she would need a C1 laminectomy to decompress the neural elements. And I do not believe that an occipitocervical cervical fusion would be feasible because I would think the morbidity and mortality risk for an 82 year old would be unacceptable. I still feel the risk of surgery may be higher than the risk of no surgery.  This is an extremely difficult situation. She will likely have to spend her remaining life in a cervical collar to offer some external stability.

## 2016-06-22 NOTE — NC FL2 (Signed)
[place] LEVEL OF CARE SCREENING TOOL     IDENTIFICATION  Patient Name: [name] Birthdate: 1924/12/29 Sex: female Admission Date (Current Location): 06/19/2016  [facility] [place] Number:  Herbalist and Address:  The [facility]. [facility] [place] [address] [place], [place] [address]      Provider Number: [id number]  Attending Physician Name and Address:  Wallis Bamberg, MD  Relative Name and Phone Number:       Current Level of Care: Hospital Recommended Level of Care: [place] Prior Approval Number:    Date Approved/Denied:   PASRR Number: [id number]  Discharge Plan: SNF    Current Diagnoses: Patient Active Problem List   Diagnosis Date Noted  . Pressure injury of skin 06/21/2016  . Acute cystitis without hematuria   . Spinal stenosis in cervical region 06/20/2016  . Upper extremity weakness 06/20/2016  . UTI (urinary tract infection) 06/20/2016  . Spinal stenosis 06/20/2016  . Bilateral leg weakness 06/20/2016  . Delirium   . Essential hypertension   . Postoperative anemia due to acute blood loss 01/26/2016  . GERD (gastroesophageal reflux disease) 01/21/2016  . Fracture of femoral neck, right, closed ([place]) 01/21/2016  . Closed displaced fracture of right femoral neck ([place]) 01/21/2016  . Closed right hip fracture ([place]) 01/21/2016  . Fall   . Odontoid fracture ([place])   . Protein-calorie malnutrition, moderate ([place]) 02/19/2014  . Severe diarrhea 02/18/2014  . Metabolic acidosis [id number]  . Weight loss, unintentional 02/18/2014  . Rash of neck 02/18/2014  . Hypothyroidism 02/18/2014  . Chronic anticoagulation 02/18/2014  . Tonsillar exudate 02/18/2014  . Lumbar radiculopathy 12/28/2013  . Cancer ([place]) 12/28/2013  . DDD (degenerative disc disease), lumbosacral 12/28/2013  . Glaucoma 12/28/2013  . Herpes zona 12/28/2013  . Chronic ulcer of skin ([place]) 12/28/2013  . Arthropathia 11/02/2013  .  Failed back syndrome of lumbar spine 11/02/2013  . Anemia of unknown etiology 01/21/2012  . Hypokalemia 01/19/2012  . Anemia due to blood loss, acute 01/19/2012  . Infection of prosthetic knee joint ([place]) 01/18/2012  . Drug rash 12/14/2011  . Prosthetic joint infection ([place]) 12/14/2011  . GBS (group B streptococcus) infection 12/14/2011  . Hx of thyroid cancer 12/14/2011  . Bilateral lower extremity edema 12/14/2011  . Hypertension 01/05/2011    Orientation RESPIRATION BLADDER Height & Weight     Self, Time, Situation, Place  Normal Incontinent Weight: 124 lb (56.2 kg) Height:  5\' 1"  (154.9 cm)  BEHAVIORAL SYMPTOMS/MOOD NEUROLOGICAL BOWEL NUTRITION STATUS      Incontinent Diet  AMBULATORY STATUS COMMUNICATION OF NEEDS Skin   Extensive Assist Verbally Normal                       Personal Care Assistance Level of Assistance  Bathing, Feeding, Dressing Bathing Assistance: Limited assistance Feeding assistance: Independent Dressing Assistance: Limited assistance     Functional Limitations Info  Sight, Speech, Hearing Sight Info: Adequate Hearing Info: Adequate Speech Info: Adequate    SPECIAL CARE FACTORS FREQUENCY  PT (By licensed PT), OT (By licensed OT)     PT Frequency: 5 OT Frequency: 5            Contractures Contractures Info: Not present    Additional Factors Info  Code Status, Allergies Code Status Info: Partial Allergies Info:  Demerol, Penicillins, Corticosteroids, Lorazepam, Tetracyclines & Related, Vancomycin           Current Medications (06/22/2016):  This is the current hospital active medication list Current Facility-Administered Medications  Medication Dose Route Frequency Provider Last Rate Last Dose  . acetaminophen (TYLENOL) tablet 650 mg  650 mg Oral Q6H PRN Radene Gunning, NP   650 mg at 06/21/16 2050   Or  . acetaminophen (TYLENOL) suppository 650 mg  650 mg Rectal Q6H PRN Radene Gunning, NP      . brimonidine (ALPHAGAN) 0.2 %  ophthalmic solution 1 drop  1 drop Both Eyes BID Etta Quill, DO   1 drop at 06/22/16 [id number]  . ciprofloxacin (CIPRO) IVPB 400 mg  400 mg Intravenous Q12H Etta Quill, DO 200 mL/hr at 06/22/16 1055 400 mg at 06/22/16 1055  . dexamethasone (DECADRON) injection 10 mg  10 mg Intravenous Daily Radene Gunning, NP   10 mg at 06/22/16 0906  . diazepam (VALIUM) injection 2.5-5 mg  2.5-5 mg Intravenous Q8H PRN Waldemar Dickens, MD      . dorzolamide-timolol (COSOPT) 22.3-6.8 MG/ML ophthalmic solution 1 drop  1 drop Both Eyes BID Etta Quill, DO   1 drop at 06/22/16 0907  . enoxaparin (LOVENOX) injection 30 mg  30 mg Subcutaneous Q24H Romona Curls, RPH   30 mg at 06/22/16 1105  . hydrALAZINE (APRESOLINE) injection 5-10 mg  5-10 mg Intravenous Q4H PRN Waldemar Dickens, MD   10 mg at 06/21/16 2050  . HYDROcodone-acetaminophen (NORCO/VICODIN) 5-325 MG per tablet 1-2 tablet  1-2 tablet Oral Q4H PRN Radene Gunning, NP   1 tablet at 06/21/16 1045  . ketorolac (TORADOL) 15 MG/ML injection 15 mg  15 mg Intravenous Q6H PRN Radene Gunning, NP   15 mg at 06/21/16 0610  . latanoprost (XALATAN) 0.005 % ophthalmic solution 1 drop  1 drop Both Eyes QHS Etta Quill, DO   1 drop at 06/21/16 2319  . levothyroxine (SYNTHROID, LEVOTHROID) tablet 137 mcg  137 mcg Oral QAC breakfast Etta Quill, DO   137 mcg at 06/22/16 [id number]  . ondansetron (ZOFRAN) tablet 4 mg  4 mg Oral Q6H PRN Radene Gunning, NP       Or  . ondansetron [facility]) injection 4 mg  4 mg Intravenous Q6H PRN Radene Gunning, NP   4 mg at 06/20/16 1732  . oxyCODONE (OXYCONTIN) 12 hr tablet 10 mg  10 mg Oral Q12H Radene Gunning, NP   10 mg at 06/22/16 0906  . propranolol (INDERAL) tablet 20 mg  20 mg Oral Daily Etta Quill, DO   20 mg at 06/22/16 [id number]  . spironolactone (ALDACTONE) tablet 25 mg  25 mg Oral BID Etta Quill, DO   25 mg at 06/22/16 [id number]  . traZODone (DESYREL) tablet 50 mg  50 mg Oral QHS PRN Waldemar Dickens, MD   50 mg at 06/20/16 2130      Discharge Medications: Please see discharge summary for a list of discharge medications.  Relevant Imaging Results:  Relevant Lab Results:   Additional Information SSN:  [id number]  Darden Dates, LCSW

## 2016-06-22 NOTE — Progress Notes (Signed)
Physical Therapy Treatment Patient Details Name: [name] MRN: [id number] DOB: [birth year] Today's Date: 06/22/2016    History of Present Illness pt presents with C2 fx in hard collar with general decline at home.  pt with hx of falls, Anemia, HTN, Glaucoma, Macular Degeneration, multiple back surgeries, bil THRs, and Bil TKRs.      PT Comments    Patient continues to required assistance for all mobility and +2 needed for OOB mobility. Current plan remains appropriate.   Follow Up Recommendations  SNF     Equipment Recommendations  None recommended by PT    Recommendations for Other Services       Precautions / Restrictions Precautions Precautions: Fall;Cervical Required Braces or Orthoses: Cervical Brace Cervical Brace: Hard collar;At all times Restrictions Weight Bearing Restrictions: No    Mobility  Bed Mobility Overal bed mobility: Needs Assistance Bed Mobility: Supine to Sit     Supine to sit: Min assist;+2 for physical assistance;HOB elevated     General bed mobility comments: cues for sequencing; assist to elevate trunk, scoot hips to EOB with use of bed pad, and for trunk stabilization upon sitting  Transfers Overall transfer level: Needs assistance Equipment used: 2 person hand held assist;Rolling walker (2 wheeled) Transfers: Sit to/from Stand Sit to Stand: Min assist;+2 physical assistance         General transfer comment: cues for safe hand placement; X2 from EOB; first trial with RW and second with +2 HHA to see if AD was creating increased neck pain; pt reported pain as the same with/without AD; pt unsteady in standing with postural sway  Ambulation/Gait Ambulation/Gait assistance: Mod assist;+2 safety/equipment Ambulation Distance (Feet):  (8, 10) Assistive device: Rolling walker (2 wheeled);2 person hand held assist Gait Pattern/deviations: Step-through pattern;Decreased stride length     General Gait Details: first bout with +2 HHA  and second bout with RW; pt required assistance for balance, weight shifting, and RW management as pt demonstrated bilat UE weakness (R > L); pt impulsive and has tendency to sit prematurely without warning and required assist and max cues for safety; pt cannot tolerate trunk rotation or backward stepping without LOB and assistance to recover   Stairs            Wheelchair Mobility    Modified Rankin (Stroke Patients Only)       Balance     Sitting balance-Leahy Scale: Fair       Standing balance-Leahy Scale: Poor                      Cognition Arousal/Alertness: Awake/alert Behavior During Therapy: Impulsive Overall Cognitive Status: No family/caregiver present to determine baseline cognitive functioning Area of Impairment: Orientation;Safety/judgement;Problem solving Orientation Level: Disoriented to;Situation       Safety/Judgement: Decreased awareness of safety;Decreased awareness of deficits   Problem Solving: Requires verbal cues General Comments: Pt feels she can return home at this time even when discussing level of assist she currently needs. Seems unaware of physical deficits.    Exercises      General Comments        Pertinent Vitals/Pain Pain Assessment: Faces Faces Pain Scale: Hurts little more Pain Location: neck with mobility Pain Descriptors / Indicators: Guarding;Grimacing;Sore Pain Intervention(s): Limited activity within patient's tolerance;Monitored during session;Premedicated before session;Repositioned    Home Living Family/patient expects to be discharged to:: Private residence Living Arrangements: Spouse/significant other Available Help at Discharge: Family;Available 24 hours/day Type of Home: House Home Access: Stairs to  enter Entrance Stairs-Rails: Left;Right;Can reach both Home Layout: Able to live on main level with bedroom/bathroom Home Equipment: Walker - 4 wheels;Cane - single point;Bedside commode;Grab bars -  toilet;Grab bars - tub/shower;Shower seat Additional Comments: Home set up information taken from prior hospital admission.    Prior Function Level of Independence: Independent with assistive device(s)      Comments: Pt reports she was independent with ADL PTA and used a RW for mobility. Unsure of accuracy of information provided by pt and no family present at this time to confirm.   PT Goals (current goals can now be found in the care plan section) Acute Rehab PT Goals Patient Stated Goal: go home Progress towards PT goals: Progressing toward goals    Frequency    Min 2X/week      PT Plan Current plan remains appropriate    Co-evaluation PT/OT/SLP Co-Evaluation/Treatment: Yes Reason for Co-Treatment: Complexity of the patient's impairments (multi-system involvement);For patient/therapist safety PT goals addressed during session: Mobility/safety with mobility;Balance OT goals addressed during session: ADL's and self-care     End of Session Equipment Utilized During Treatment: Gait belt;Cervical collar Activity Tolerance: Patient limited by fatigue;Patient limited by pain Patient left: in chair;with call bell/phone within reach;with chair alarm set     Time: 1031-1055 PT Time Calculation (min) (ACUTE ONLY): 24 min  Charges:  $Gait Training: 8-22 mins                    G Codes:      Salina April, PTA Pager: [phone]   06/22/2016, 1:33 PM

## 2016-06-23 DIAGNOSIS — D649 Anemia, unspecified: Secondary | ICD-10-CM

## 2016-06-23 LAB — BASIC METABOLIC PANEL
Anion gap: 10 (ref 5–15)
BUN: 46 mg/dL — AB (ref 6–20)
CO2: 25 mmol/L (ref 22–32)
CREATININE: 0.75 mg/dL (ref 0.44–1.00)
Calcium: 8.7 mg/dL — ABNORMAL LOW (ref 8.9–10.3)
Chloride: 97 mmol/L — ABNORMAL LOW (ref 101–111)
GFR calc Af Amer: >60 mL/min (ref >60)
Glucose, Bld: 129 mg/dL — ABNORMAL HIGH (ref 65–99)
POTASSIUM: 3.7 mmol/L (ref 3.5–5.1)
SODIUM: 132 mmol/L — AB (ref 135–145)

## 2016-06-23 LAB — URINE CULTURE: SPECIAL REQUESTS: NORMAL

## 2016-06-23 MED ORDER — GLYCERIN (LAXATIVE) 2.1 G RE SUPP
1.0000 | RECTAL | Status: DC | PRN
  Filled 2016-06-23 (×2): qty 1

## 2016-06-23 MED ORDER — SULFAMETHOXAZOLE-TRIMETHOPRIM 800-160 MG PO TABS: 1.0000 | ORAL_TABLET | Freq: Two times a day (BID) | ORAL | 0 refills | Status: AC

## 2016-06-23 MED ORDER — SULFAMETHOXAZOLE-TRIMETHOPRIM 800-160 MG PO TABS
1.0000 | ORAL_TABLET | Freq: Two times a day (BID) | ORAL | Status: DC
  Administered 2016-06-23: 1 via ORAL
  Filled 2016-06-23: qty 1

## 2016-06-23 MED ORDER — POLYETHYLENE GLYCOL 3350 17 G PO PACK
17.0000 g | PACK | Freq: Every day | ORAL | Status: DC
  Administered 2016-06-23: 17 g via ORAL
  Filled 2016-06-23: qty 1

## 2016-06-23 MED ORDER — OXYCODONE HCL ER 10 MG PO T12A: 10.0000 mg | EXTENDED_RELEASE_TABLET | Freq: Every day | ORAL | Status: DC

## 2016-06-23 MED ORDER — LEVOTHYROXINE SODIUM 100 MCG PO TABS
125.0000 ug | ORAL_TABLET | Freq: Every day | ORAL | Status: DC
  Filled 2016-06-23: qty 1

## 2016-06-23 MED ORDER — LEVOTHYROXINE SODIUM 125 MCG PO TABS: 125.0000 ug | ORAL_TABLET | Freq: Every day | ORAL | 0 refills | Status: AC

## 2016-06-23 NOTE — Discharge Summary (Signed)
Physician Discharge Summary  [name] [id number] DOB: 01/26/1925 DOA: 06/19/2016  PCP: Merrilee Seashore, MD  Admit date: 06/19/2016 Discharge date: 06/23/2016  Admitted From: Home Disposition:   Home (PT and OT recommend SNF)  Recommendations for Outpatient Follow-up:  1. Get set up with PT/OT/Aide/RN with home health 2. Wear cervical collar 3. Follow up with Dr. Ronnald Ramp in 4 weeks 4. 3 additional day for treatment of UTI 5. Drink significant amount of water daily 6. Use walker to ambulate 7. Follow up with PCP in 1-2 weeks   Home Health: Yes- PT/OT/Aide/RN Equipment/Devices: Walker  Discharge Condition: Stable CODE STATUS: Full code Diet recommendation: Heart Healthy  Brief/Interim Summary: Sheila Sokolski Parksis a delightful 82 y.o.femalewith medical history significant for hypertension, glaucoma, macular degeneration, chronic back pain, GERD, hypothyroidism, thyroid cancer, recent fall with right femoral neck fracture urgently repaired and odontoid fracture since to the emergency department with the chief complaint bilateral lower extremity weakness and right upper extremity weakness. Initial evaluation reveals UTI and MRI significant for increasing spinal stenosis.  Information is obtained from the patient. She states months ago she fell sustained several injuries one of which wasn't neck fracture. She states she will call her for a while and then was able to come out of that. Over the last several days she developed persistent worsening bilateral lower extremity weakness and right upper extremity weakness. Associated symptoms include decreased oral intake generalized weakness some nausea without emesis. She denies numbness or tingling of her extremities. She denies headache visual disturbances difficulty chewing or swallowing. She denies any abdominal pain diarrhea constipation melena. She denies dysuria hematuria frequency or urgency.  In the emergency department  patient was afebrile hypertensive had an episode of tachycardia but was not hypoxic. She is provided with pain medicine on several occasions and gentle IV fluids.  Patient was evaluated by neurosurgery but found to not be a neurosurgical candidate.  She was instructed to continue to wear her cervical collar indefinitely.  She was evaluated by PT/OT and was encouraged to go to a SNF.  She mentioned she did not want to go to a skilled nursing facility even though she repeatedly needed assistance to ambulate around her room and down the hall.  She was set up with [facility].  She will be treated for an additional 3 days on antibiotics for her UTI and her thyroid medication will be decreased as her TSH was low during this admission.  She was then told to follow up with her primary care physician within the next week to ensure resolution of her urinary tract infection.   Discharge Diagnoses:  Principal Problem:   Spinal stenosis in cervical region Active Problems:   Hypertension   Anemia of unknown etiology   Hypothyroidism   Protein-calorie malnutrition, moderate (HCC)   GERD (gastroesophageal reflux disease)   Odontoid fracture (HCC)   Upper extremity weakness   UTI (urinary tract infection)   Bilateral leg weakness   Pressure injury of skin   Acute cystitis without hematuria    Discharge Instructions  Discharge Instructions    Call MD for:  difficulty breathing, headache or visual disturbances    Complete by:  As directed    Call MD for:  extreme fatigue    Complete by:  As directed    Call MD for:  persistant dizziness or light-headedness    Complete by:  As directed    Call MD for:  persistant nausea and vomiting    Complete by:  As  directed    Call MD for:  redness, tenderness, or signs of infection (pain, swelling, redness, odor or green/yellow discharge around incision site)    Complete by:  As directed    Call MD for:  temperature >100.4    Complete by:  As directed    Diet -  low sodium heart healthy    Complete by:  As directed    Diet - low sodium heart healthy    Complete by:  As directed    Increase activity slowly    Complete by:  As directed    Walk with assistance    Complete by:  As directed    Walk with assistance    Complete by:  As directed    Walker     Complete by:  As directed    Walker     Complete by:  As directed        Medication List    TAKE these medications   brimonidine 0.2 % ophthalmic solution Commonly known as:  ALPHAGAN Place 1 drop into both eyes 2 (two) times daily.   dorzolamide-timolol 22.3-6.8 MG/ML ophthalmic solution Commonly known as:  COSOPT Place 1 drop into both eyes 2 (two) times daily.   latanoprost 0.005 % ophthalmic solution Commonly known as:  XALATAN Place 1 drop into both eyes at bedtime.   levothyroxine 125 MCG tablet Commonly known as:  SYNTHROID, LEVOTHROID Take 1 tablet (125 mcg total) by mouth daily before breakfast. Start taking on:  06/24/2016 What changed:  medication strength  how much to take   oxyCODONE 10 mg 12 hr tablet Commonly known as:  OXYCONTIN Take 1 tablet (10 mg total) by mouth daily.   propranolol 20 MG tablet Commonly known as:  INDERAL Take 20 mg by mouth daily. Takes for migraines.   spironolactone 25 MG tablet Commonly known as:  ALDACTONE Take 25 mg by mouth 2 (two) times daily.   sulfamethoxazole-trimethoprim 800-160 MG tablet Commonly known as:  BACTRIM DS,SEPTRA DS Take 1 tablet by mouth every 12 (twelve) hours.      Follow-up Information    [facility] .   Specialty:  Home Health Services Why:  Elta Guadeloupe has been requested as your physical therapist.  Dennis Bast will also have occupational therapy, nurse and aide.   Contact information: [address] [place] [address] [phone]          Allergies  Allergen Reactions  . Demerol Shortness Of Breath  . Penicillins Swelling    "in the face" Has patient had a PCN reaction causing  immediate rash, facial/tongue/throat swelling, SOB or lightheadedness with hypotension: yes Has patient had a PCN reaction causing severe rash involving mucus membranes or skin necrosis: no Has patient had a PCN reaction that required hospitalization: no  Has patient had a PCN reaction occurring within the last 10 years: no If all of the above answers are "NO", then may proceed with Cephalosporin use.   . Corticosteroids Other (See Comments)    DIZZINESS, HEADACHE  . Lorazepam Anxiety  . Tetracyclines & Related Rash  . Vancomycin Rash    Consultations:  Neurosurgery  PT/OT   Procedures/Studies: Ct Head Wo Contrast  Result Date: 06/19/2016 CLINICAL DATA:  Acute onset of right-sided headache, radiating down the right side of the neck. High blood pressure. Initial encounter. EXAM: CT HEAD WITHOUT CONTRAST CT CERVICAL SPINE WITHOUT CONTRAST TECHNIQUE: Multidetector CT imaging of the head and cervical spine was performed following the standard  protocol without intravenous contrast. Multiplanar CT image reconstructions of the cervical spine were also generated. COMPARISON:  CT of the head performed 04/25/2016, and CT of the cervical spine performed 03/29/2016. MRI of the cervical spine performed 05/16/2016 FINDINGS: CT HEAD FINDINGS Brain: No evidence of acute infarction, hemorrhage, hydrocephalus, extra-axial collection or mass lesion/mass effect. Prominence of the ventricles sulci reflects mild cortical volume loss. Scattered periventricular white matter change likely reflects small vessel ischemic microangiopathy. The brainstem and fourth ventricle are within normal limits. The basal ganglia are unremarkable in appearance. The cerebral hemispheres demonstrate grossly normal gray-white differentiation. No mass effect or midline shift is seen. Vascular: No hyperdense vessel or unexpected calcification. Skull: There is no evidence of fracture; visualized osseous structures are unremarkable in  appearance. Sinuses/Orbits: The orbits are within normal limits. The paranasal sinuses and mastoid air cells are well-aerated. Other: No significant soft tissue abnormalities are seen. CT CERVICAL SPINE FINDINGS Alignment: Normal. Skull base and vertebrae: No acute fracture. No primary bone lesion or focal pathologic process. There is a chronic relatively stable fracture of the upper dens, grossly unchanged from the prior studies. Soft tissues and spinal canal: No prevertebral fluid or swelling. No visible canal hematoma. Chronic central canal stenosis at C1-C2 reflects the fracture described above. Disc levels: Multilevel disc space narrowing is noted, with loss of the disc spaces at C2-C4, reflecting some degree of osseous fusion. Scattered anterior and posterior disc osteophyte complexes are noted along the cervical and upper thoracic spine. Upper chest: Minimal scarring is noted at the lung apices. The patient is status post thyroidectomy. Other: No additional soft tissue abnormalities are seen. IMPRESSION: 1. No evidence of traumatic intracranial injury or fracture. 2. No evidence of fracture or subluxation along the cervical spine. 3. Mild cortical volume loss and scattered small vessel ischemic microangiopathy. 4. Diffuse degenerative change along the cervical spine, with chronic osseous fusion at C2-C4. 5. Chronic relatively stable fracture of the upper dens, with underlying chronic central canal stenosis, better characterized on prior MRI. 6. Minimal scarring at the lung apices. Electronically Signed   By: Garald Balding M.D.   On: 06/19/2016 20:51   Ct Cervical Spine Wo Contrast  Result Date: 06/19/2016 CLINICAL DATA:  Acute onset of right-sided headache, radiating down the right side of the neck. High blood pressure. Initial encounter. EXAM: CT HEAD WITHOUT CONTRAST CT CERVICAL SPINE WITHOUT CONTRAST TECHNIQUE: Multidetector CT imaging of the head and cervical spine was performed following the  standard protocol without intravenous contrast. Multiplanar CT image reconstructions of the cervical spine were also generated. COMPARISON:  CT of the head performed 04/25/2016, and CT of the cervical spine performed 03/29/2016. MRI of the cervical spine performed 05/16/2016 FINDINGS: CT HEAD FINDINGS Brain: No evidence of acute infarction, hemorrhage, hydrocephalus, extra-axial collection or mass lesion/mass effect. Prominence of the ventricles sulci reflects mild cortical volume loss. Scattered periventricular white matter change likely reflects small vessel ischemic microangiopathy. The brainstem and fourth ventricle are within normal limits. The basal ganglia are unremarkable in appearance. The cerebral hemispheres demonstrate grossly normal gray-white differentiation. No mass effect or midline shift is seen. Vascular: No hyperdense vessel or unexpected calcification. Skull: There is no evidence of fracture; visualized osseous structures are unremarkable in appearance. Sinuses/Orbits: The orbits are within normal limits. The paranasal sinuses and mastoid air cells are well-aerated. Other: No significant soft tissue abnormalities are seen. CT CERVICAL SPINE FINDINGS Alignment: Normal. Skull base and vertebrae: No acute fracture. No primary bone lesion or focal pathologic process.  There is a chronic relatively stable fracture of the upper dens, grossly unchanged from the prior studies. Soft tissues and spinal canal: No prevertebral fluid or swelling. No visible canal hematoma. Chronic central canal stenosis at C1-C2 reflects the fracture described above. Disc levels: Multilevel disc space narrowing is noted, with loss of the disc spaces at C2-C4, reflecting some degree of osseous fusion. Scattered anterior and posterior disc osteophyte complexes are noted along the cervical and upper thoracic spine. Upper chest: Minimal scarring is noted at the lung apices. The patient is status post thyroidectomy. Other: No  additional soft tissue abnormalities are seen. IMPRESSION: 1. No evidence of traumatic intracranial injury or fracture. 2. No evidence of fracture or subluxation along the cervical spine. 3. Mild cortical volume loss and scattered small vessel ischemic microangiopathy. 4. Diffuse degenerative change along the cervical spine, with chronic osseous fusion at C2-C4. 5. Chronic relatively stable fracture of the upper dens, with underlying chronic central canal stenosis, better characterized on prior MRI. 6. Minimal scarring at the lung apices. Electronically Signed   By: Garald Balding M.D.   On: 06/19/2016 20:51   Mr Brain Wo Contrast  Addendum Date: 06/20/2016   ADDENDUM REPORT: 06/20/2016 09:52 ADDENDUM: Critical Value/emergent results were called by telephone at the time of interpretation on 06/20/2016 at 0935 hours to ED PA Alecia Lemming, who verbally acknowledged these results. Electronically Signed   By: Genevie Ann M.D.   On: 06/20/2016 09:52   Result Date: 06/20/2016 CLINICAL DATA:  82 year old female with progressive right side weakness/paralysis x1 month. C1 vertebral and Type 2 dens fractures sustained in May with subsequent C1-C2 spinal stenosis. Initial encounter. EXAM: MRI HEAD WITHOUT CONTRAST MRI CERVICAL SPINE WITHOUT CONTRAST TECHNIQUE: Multiplanar, multiecho pulse sequences of the brain and surrounding structures, and cervical spine, to include the craniocervical junction and cervicothoracic junction, were obtained without intravenous contrast. COMPARISON:  Head and cervical spine CT 06/19/2016 and earlier. Cervical spine MRI 05/16/2016. FINDINGS: MRI HEAD FINDINGS Brain: No restricted diffusion to suggest acute infarction. No midline shift, mass effect, evidence of mass lesion, ventriculomegaly, extra-axial collection or acute intracranial hemorrhage. Pituitary within normal limits. Pearline Cables and white matter signal is within normal limits for age throughout the brain. No abnormal signal in the  brainstem. Vascular: Major intracranial vascular flow voids appear within normal limits. Skull and upper cervical spine: Reported below. Sinuses/Orbits: Postoperative changes the globes, otherwise negative orbits soft tissues. Visualized paranasal sinuses and mastoids are stable and well pneumatized. Other: Visible internal auditory structures appear normal. Negative scalp soft tissues. MRI CERVICAL SPINE FINDINGS Study is intermittently degraded by motion artifact despite repeated imaging attempts. Alignment: C2 through C4 interbody and posterior element ankylosis. Superimposed left side C4-C5 posterior element ankylosis and C5-C6 interbody ankylosis. Subsequently, the cervical spine is essentially fused from the C2 body to the C6 level. Alignment of this fused segments is stable over these series of exams. Un healed type 2 odontoid fracture persists as seen on the CT yesterday. Vertebrae:   No new osseous abnormality identified. Cord: Ligamentous hypertrophy about the odontoid and especially posterior ligamentous hypertrophy about the posterior C1 ring again noted and combine for moderate spinal stenosis at the C1-C2 level (series 16, image 11 and series 14, image 7) which appears mildly progressed since September. Subsequent moderate mass effect on the spinal cord in the AP direction (series 16, image 11). No definite cord edema. Below C1-C2 there is additional multilevel cervical spinal stenosis. No definite cord signal abnormality at those levels. Visualized  upper thoracic spinal cord appears within normal limits. Posterior Fossa, vertebral arteries, paraspinal tissues: Posterior fossa detailed above. New since 05/16/2016 abnormal increased prevertebral confluent T2 and STIR hyperintensity from the skullbase to C6-C7 (series 14, image 5). New confluent abnormal posterior paraspinal muscle edema (seen in the midline on also series 14, image 5). Other bilateral neck soft tissues appear stable. Preserved major  vascular flow voids. Partially retropharyngeal course of both carotids. Disc levels: Degenerative multifactorial spinal stenosis at C2-C3, C3-C4, C4-C5, C5-C6, and C6-C7 appears stable since September. IMPRESSION: 1. Mild further progression since September of moderate C1-C2 spinal stenosis with upper cervical cord compression due to ligamentous hypertrophy and mild bony displacement about the un-healed type 2 odontoid fracture. 2. No definite cervical spinal cord edema or signal abnormality. Multifactorial degenerative spinal stenosis also from C2-C3 through C6-C7 appears stable since September, including some lesser cord mass effect. 3. New diffuse abnormal signal in the posterior paraspinal muscles. If there has been no interval trauma then perhaps this reflects widespread a Muscle Denervation Injury related to the progressive compressive myelopathy. 4. Increased abnormal prevertebral soft tissue edema also noted, nonspecific. 5. Underlying chronic cervical spine ankylosis from the C2 body to the C6 vertebra. 6. No acute intracranial abnormality. Normal for age noncontrast MRI appearance of the brain. Electronically Signed: By: Genevie Ann M.D. On: 06/20/2016 09:30   Mr Cervical Spine Wo Contrast  Addendum Date: 06/20/2016   ADDENDUM REPORT: 06/20/2016 09:52 ADDENDUM: Critical Value/emergent results were called by telephone at the time of interpretation on 06/20/2016 at 0935 hours to ED PA Alecia Lemming, who verbally acknowledged these results. Electronically Signed   By: Genevie Ann M.D.   On: 06/20/2016 09:52   Result Date: 06/20/2016 CLINICAL DATA:  82 year old female with progressive right side weakness/paralysis x1 month. C1 vertebral and Type 2 dens fractures sustained in May with subsequent C1-C2 spinal stenosis. Initial encounter. EXAM: MRI HEAD WITHOUT CONTRAST MRI CERVICAL SPINE WITHOUT CONTRAST TECHNIQUE: Multiplanar, multiecho pulse sequences of the brain and surrounding structures, and cervical spine,  to include the craniocervical junction and cervicothoracic junction, were obtained without intravenous contrast. COMPARISON:  Head and cervical spine CT 06/19/2016 and earlier. Cervical spine MRI 05/16/2016. FINDINGS: MRI HEAD FINDINGS Brain: No restricted diffusion to suggest acute infarction. No midline shift, mass effect, evidence of mass lesion, ventriculomegaly, extra-axial collection or acute intracranial hemorrhage. Pituitary within normal limits. Pearline Cables and white matter signal is within normal limits for age throughout the brain. No abnormal signal in the brainstem. Vascular: Major intracranial vascular flow voids appear within normal limits. Skull and upper cervical spine: Reported below. Sinuses/Orbits: Postoperative changes the globes, otherwise negative orbits soft tissues. Visualized paranasal sinuses and mastoids are stable and well pneumatized. Other: Visible internal auditory structures appear normal. Negative scalp soft tissues. MRI CERVICAL SPINE FINDINGS Study is intermittently degraded by motion artifact despite repeated imaging attempts. Alignment: C2 through C4 interbody and posterior element ankylosis. Superimposed left side C4-C5 posterior element ankylosis and C5-C6 interbody ankylosis. Subsequently, the cervical spine is essentially fused from the C2 body to the C6 level. Alignment of this fused segments is stable over these series of exams. Un healed type 2 odontoid fracture persists as seen on the CT yesterday. Vertebrae:   No new osseous abnormality identified. Cord: Ligamentous hypertrophy about the odontoid and especially posterior ligamentous hypertrophy about the posterior C1 ring again noted and combine for moderate spinal stenosis at the C1-C2 level (series 16, image 11 and series 14, image 7) which appears mildly  progressed since September. Subsequent moderate mass effect on the spinal cord in the AP direction (series 16, image 11). No definite cord edema. Below C1-C2 there is  additional multilevel cervical spinal stenosis. No definite cord signal abnormality at those levels. Visualized upper thoracic spinal cord appears within normal limits. Posterior Fossa, vertebral arteries, paraspinal tissues: Posterior fossa detailed above. New since 05/16/2016 abnormal increased prevertebral confluent T2 and STIR hyperintensity from the skullbase to C6-C7 (series 14, image 5). New confluent abnormal posterior paraspinal muscle edema (seen in the midline on also series 14, image 5). Other bilateral neck soft tissues appear stable. Preserved major vascular flow voids. Partially retropharyngeal course of both carotids. Disc levels: Degenerative multifactorial spinal stenosis at C2-C3, C3-C4, C4-C5, C5-C6, and C6-C7 appears stable since September. IMPRESSION: 1. Mild further progression since September of moderate C1-C2 spinal stenosis with upper cervical cord compression due to ligamentous hypertrophy and mild bony displacement about the un-healed type 2 odontoid fracture. 2. No definite cervical spinal cord edema or signal abnormality. Multifactorial degenerative spinal stenosis also from C2-C3 through C6-C7 appears stable since September, including some lesser cord mass effect. 3. New diffuse abnormal signal in the posterior paraspinal muscles. If there has been no interval trauma then perhaps this reflects widespread a Muscle Denervation Injury related to the progressive compressive myelopathy. 4. Increased abnormal prevertebral soft tissue edema also noted, nonspecific. 5. Underlying chronic cervical spine ankylosis from the C2 body to the C6 vertebra. 6. No acute intracranial abnormality. Normal for age noncontrast MRI appearance of the brain. Electronically Signed: By: Genevie Ann M.D. On: 06/20/2016 09:30     Subjective: Patient and husband beligerent about going home today.  State that they were told by patient's doctor she could leave today.  Explained that patient would be best served by  going to a short term rehab to get more strength and better balance.  Both patient and her husband deny this as an option.  State they are fine going home and managing on their own.  Patient reports repeatedly that she does not need rehab.  State they previously had home health and would like that home health agency to come again for rehab.  Explained to patient I have significant concerns about her safety at home but she denies any rehab besides at home rehab.  Discharge Exam: Vitals:   06/23/16 1021 06/23/16 1342  BP: (!) 171/94 (!) 150/76  Pulse: (!) 113 81  Resp: 20 20  Temp: 98.2 F (36.8 C) 98 F (36.7 C)   Vitals:   06/23/16 0126 06/23/16 0522 06/23/16 1021 06/23/16 1342  BP: 130/62 (!) 167/72 (!) 171/94 (!) 150/76  Pulse: 100 (!) 102 (!) 113 81  Resp: 20 18 20 20   Temp: 99.5 F (37.5 C) 98.5 F (36.9 C) 98.2 F (36.8 C) 98 F (36.7 C)  TempSrc: Axillary Oral Oral Oral  SpO2: 96% 98% 99% 99%  Weight:      Height:        General: Pt is alert, awake, not in acute distress Cardiovascular: RRR, S1/S2 +, no rubs, no gallops Respiratory: CTA bilaterally, no wheezing, no rhonchi Abdominal: Soft, NT, ND, bowel sounds + Extremities: no edema, no cyanosis, weak, needs significant assistance to ambulate Psych: extremely limited insight- does not understand chronicity of C2 fracture as well as complication rate of possibility of adverse effects from the possible surgical interventions   The results of significant diagnostics from this hospitalization (including imaging, microbiology, ancillary and laboratory) are listed below for  reference.     Microbiology: Recent Results (from the past 240 hour(s))  Urine culture     Status: Abnormal   Collection Time: 06/19/16  9:10 PM  Result Value Ref Range Status   Specimen Description URINE, CLEAN CATCH  Final   Special Requests Normal  Final   Culture (A)  Final    >=100,000 COLONIES/mL KLEBSIELLA PNEUMONIAE >=100,000 COLONIES/mL  ESCHERICHIA COLI    Report Status 06/23/2016 FINAL  Final   Organism ID, Bacteria KLEBSIELLA PNEUMONIAE (A)  Final   Organism ID, Bacteria ESCHERICHIA COLI (A)  Final      Susceptibility   Escherichia coli - MIC*    AMPICILLIN 4 SENSITIVE Sensitive     CEFAZOLIN <=4 SENSITIVE Sensitive     CEFTRIAXONE <=1 SENSITIVE Sensitive     CIPROFLOXACIN <=0.25 SENSITIVE Sensitive     GENTAMICIN <=1 SENSITIVE Sensitive     IMIPENEM <=0.25 SENSITIVE Sensitive     NITROFURANTOIN <=16 SENSITIVE Sensitive     TRIMETH/SULFA <=20 SENSITIVE Sensitive     AMPICILLIN/SULBACTAM <=2 SENSITIVE Sensitive     PIP/TAZO <=4 SENSITIVE Sensitive     Extended ESBL NEGATIVE Sensitive     * >=100,000 COLONIES/mL ESCHERICHIA COLI   Klebsiella pneumoniae - MIC*    AMPICILLIN >=32 RESISTANT Resistant     CEFAZOLIN <=4 SENSITIVE Sensitive     CEFTRIAXONE <=1 SENSITIVE Sensitive     CIPROFLOXACIN <=0.25 SENSITIVE Sensitive     GENTAMICIN <=1 SENSITIVE Sensitive     IMIPENEM <=0.25 SENSITIVE Sensitive     NITROFURANTOIN 32 SENSITIVE Sensitive     TRIMETH/SULFA <=20 SENSITIVE Sensitive     AMPICILLIN/SULBACTAM 4 SENSITIVE Sensitive     PIP/TAZO <=4 SENSITIVE Sensitive     Extended ESBL NEGATIVE Sensitive     * >=100,000 COLONIES/mL KLEBSIELLA PNEUMONIAE     Labs: BNP (last 3 results) No results for input(s): BNP in the last 8760 hours. Basic Metabolic Panel:  Recent Labs Lab 06/19/16 2201 06/21/16 0455 06/23/16 0454  NA 136 132* 132*  K 4.2 4.1 3.7  CL 102 97* 97*  CO2 25 20* 25  GLUCOSE 95 148* 129*  BUN 16 26* 46*  CREATININE 0.80 0.98 0.75  CALCIUM 8.7* 8.9 8.7*   Liver Function Tests:  Recent Labs Lab 06/19/16 2201  AST 16  ALT 12*  ALKPHOS 74  BILITOT 0.8  PROT 5.8*  ALBUMIN 3.7   No results for input(s): LIPASE, AMYLASE in the last 168 hours. No results for input(s): AMMONIA in the last 168 hours. CBC:  Recent Labs Lab 06/19/16 2201 06/21/16 0455  WBC 5.1 10.8*  NEUTROABS 3.8   --   HGB 10.6* 15.0  HCT 32.6* 45.2  MCV 94.2 93.0  PLT 227 317   Cardiac Enzymes:  Recent Labs Lab 06/19/16 2201  CKTOTAL 88   BNP: Invalid input(s): POCBNP CBG: No results for input(s): GLUCAP in the last 168 hours. D-Dimer No results for input(s): DDIMER in the last 72 hours. Hgb A1c No results for input(s): HGBA1C in the last 72 hours. Lipid Profile No results for input(s): CHOL, HDL, LDLCALC, TRIG, CHOLHDL, LDLDIRECT in the last 72 hours. Thyroid function studies  Recent Labs  06/22/16 1818  TSH 0.025*   Anemia work up No results for input(s): VITAMINB12, FOLATE, FERRITIN, TIBC, IRON, RETICCTPCT in the last 72 hours. Urinalysis    Component Value Date/Time   COLORURINE YELLOW 06/19/2016 2110   APPEARANCEUR CLOUDY (A) 06/19/2016 2110   LABSPEC 1.012 06/19/2016 2110   PHURINE  6.5 06/19/2016 2110   GLUCOSEU NEGATIVE 06/19/2016 2110   HGBUR MODERATE (A) 06/19/2016 2110   BILIRUBINUR NEGATIVE 06/19/2016 2110   KETONESUR NEGATIVE 06/19/2016 2110   PROTEINUR NEGATIVE 06/19/2016 2110   UROBILINOGEN 1.0 02/18/2014 1617   NITRITE NEGATIVE 06/19/2016 2110   LEUKOCYTESUR LARGE (A) 06/19/2016 2110   Sepsis Labs Invalid input(s): PROCALCITONIN,  WBC,  LACTICIDVEN Microbiology Recent Results (from the past 240 hour(s))  Urine culture     Status: Abnormal   Collection Time: 06/19/16  9:10 PM  Result Value Ref Range Status   Specimen Description URINE, CLEAN CATCH  Final   Special Requests Normal  Final   Culture (A)  Final    >=100,000 COLONIES/mL KLEBSIELLA PNEUMONIAE >=100,000 COLONIES/mL ESCHERICHIA COLI    Report Status 06/23/2016 FINAL  Final   Organism ID, Bacteria KLEBSIELLA PNEUMONIAE (A)  Final   Organism ID, Bacteria ESCHERICHIA COLI (A)  Final      Susceptibility   Escherichia coli - MIC*    AMPICILLIN 4 SENSITIVE Sensitive     CEFAZOLIN <=4 SENSITIVE Sensitive     CEFTRIAXONE <=1 SENSITIVE Sensitive     CIPROFLOXACIN <=0.25 SENSITIVE Sensitive      GENTAMICIN <=1 SENSITIVE Sensitive     IMIPENEM <=0.25 SENSITIVE Sensitive     NITROFURANTOIN <=16 SENSITIVE Sensitive     TRIMETH/SULFA <=20 SENSITIVE Sensitive     AMPICILLIN/SULBACTAM <=2 SENSITIVE Sensitive     PIP/TAZO <=4 SENSITIVE Sensitive     Extended ESBL NEGATIVE Sensitive     * >=100,000 COLONIES/mL ESCHERICHIA COLI   Klebsiella pneumoniae - MIC*    AMPICILLIN >=32 RESISTANT Resistant     CEFAZOLIN <=4 SENSITIVE Sensitive     CEFTRIAXONE <=1 SENSITIVE Sensitive     CIPROFLOXACIN <=0.25 SENSITIVE Sensitive     GENTAMICIN <=1 SENSITIVE Sensitive     IMIPENEM <=0.25 SENSITIVE Sensitive     NITROFURANTOIN 32 SENSITIVE Sensitive     TRIMETH/SULFA <=20 SENSITIVE Sensitive     AMPICILLIN/SULBACTAM 4 SENSITIVE Sensitive     PIP/TAZO <=4 SENSITIVE Sensitive     Extended ESBL NEGATIVE Sensitive     * >=100,000 COLONIES/mL KLEBSIELLA PNEUMONIAE     Time coordinating discharge: Over 30 minutes  SIGNED:   Newman Pies, MD  [facility] 06/23/2016, 3:46 PM Pager [phone] If 7PM-7AM, please contact night-coverage [email or web address] Password [email or web address]

## 2016-06-23 NOTE — Care Management Note (Signed)
Case Management Note  Patient Details  Name: [name] MRN: [id number] Date of Birth: [birth year]  Subjective/Objective:                  presents with C2 fx in hard collar  Action/Plan: Discharge planning Expected Discharge Date:   (Pending)               Expected Discharge Plan:  [place]  In-House Referral:     Discharge planning Services  CM Consult  Post Acute Care Choice:  Home Health Choice offered to:  Patient  DME Arranged:  N/A DME Agency:  NA  HH Arranged:  RN, PT, OT, Nurse's Aide [place] Agency:  [facility] ([facility])  Status of Service:  Completed, signed off  If discussed at [employer], dates discussed:    Additional Comments: CM met with pt in room to offer choice of home health agency. [facility] chooses [facility] to render HHPT/OT/RN/Aide and reqeusts Doctor, general practice for PT.  Referral called to [facility] rep, Tommi Emery with [facility] as PT.  Pt states she has both rolling walker and 3n1 at home.  No other CM needs were communicated. Dellie Catholic, RN 06/23/2016, 2:33 PM

## 2016-06-23 NOTE — Discharge Instructions (Signed)
Cervical Collar A cervical collar is a device that supports your chin and the back of your head. It is used after a severe neck injury to protect your head and neck. It does this by restricting the movement of the top part of your spine, which is located in your neck. A cervical collar may be used when you have:  A fractured neck.  Ligament damage.  A spinal cord injury. WHAT INSTRUCTIONS SHOULD I FOLLOW?  Wear the collar for as long as your health care provider instructs.  Follow your health care provider's instructions about how to put on and take off your collar.  Do not make your collar so tight that you feel pain or it is hard for you to breathe.  Do not remove the collar unless your health care provider says it is okay. Ask your health care provider if you can remove the collar for showering or eating or to apply ice.  Do not drive a car until your health care provider says it is okay.  Keep all follow-up visits as directed by your health care provider. This is important. Any delay in getting necessary care can keep your injury from healing properly.  Apply ice to the injured area:  Put ice in a plastic bag.  Place a towel between your skin and the bag.  Leave the ice on for 20 minutes, 2-3 times per day for the first 2 days.   This information is not intended to replace advice given to you by your health care provider. Make sure you discuss any questions you have with your health care provider.   Document Released: 05/12/2004 Document Revised: 09/10/2014 Document Reviewed: 03/29/2014 Elsevier Interactive Patient Education [employer].

## 2016-06-23 NOTE — Progress Notes (Signed)
Pharmacy Antibiotic Note  [name] is a 82 y.o. female admitted on 06/19/2016 with UTI. Pharmacy has been consulted for ciprofloxacin dosing. Today is day 4 of antibiotic therapy. Cultures and sensitivities have returned. The urine culture is positive for klebsiella and e coli, resistant to ampicillin and sensitive to all other therapies. WBC has improved, patient remains afebrile, and renal function is stable.   Plan: Continue ciprofloxacin 400 mg IV q24h  Consider switching to PO tomorrow  Monitor renal function and clinical improvement  Height: 5\' 1"  (154.9 cm) Weight: 124 lb (56.2 kg) IBW/kg (Calculated) : 47.8  Temp (24hrs), Avg:98.7 F (37.1 C), Min:98 F (36.7 C), Max:99.5 F (37.5 C)   Recent Labs Lab 06/19/16 2201 06/21/16 0455 06/23/16 0454  WBC 5.1 10.8*  --   CREATININE 0.80 0.98 0.75    Estimated Creatinine Clearance: 36.7 mL/min (by C-G formula based on SCr of 0.75 mg/dL).    Allergies  Allergen Reactions  . Demerol Shortness Of Breath  . Penicillins Swelling    "in the face" Has patient had a PCN reaction causing immediate rash, facial/tongue/throat swelling, SOB or lightheadedness with hypotension: yes Has patient had a PCN reaction causing severe rash involving mucus membranes or skin necrosis: no Has patient had a PCN reaction that required hospitalization: no  Has patient had a PCN reaction occurring within the last 10 years: no If all of the above answers are "NO", then may proceed with Cephalosporin use.   . Corticosteroids Other (See Comments)    DIZZINESS, HEADACHE  . Lorazepam Anxiety  . Tetracyclines & Related Rash  . Vancomycin Rash    Antimicrobials this admission:  Cipro 10/18 >>   Dose adjustments this admission:  none  Microbiology results:  10/17 UCx: klebsiella pneumoniae, E coli - R amp, S to all others    Thank you for allowing pharmacy to be a part of this patient's care.  Belia Heman, PharmD PGY1 Pharmacy  Resident [phone] (Pager) 06/23/2016 1:39 PM

## 2016-06-23 NOTE — Progress Notes (Signed)
RN discussed discharge instructions with patient including f/u appt with pcp, home health set up by CM, patient refuses equipment, states she understands medication change and new medications. States she has rx for oxy at home, does not need refill. Neuro assessment unchanged. Will continue to monitor.

## 2016-06-23 NOTE — Progress Notes (Signed)
No acute events No neck pain Collar fitting well Moving arms and legs well Dr. Ronnald Ramp not planning surgery, continue collar

## 2016-06-26 DIAGNOSIS — M5416 Radiculopathy, lumbar region: Secondary | ICD-10-CM | POA: Diagnosis not present

## 2016-06-26 DIAGNOSIS — G8929 Other chronic pain: Secondary | ICD-10-CM | POA: Diagnosis not present

## 2016-06-26 DIAGNOSIS — N390 Urinary tract infection, site not specified: Secondary | ICD-10-CM | POA: Diagnosis not present

## 2016-06-26 DIAGNOSIS — M5137 Other intervertebral disc degeneration, lumbosacral region: Secondary | ICD-10-CM | POA: Diagnosis not present

## 2016-06-26 DIAGNOSIS — M4802 Spinal stenosis, cervical region: Secondary | ICD-10-CM | POA: Diagnosis not present

## 2016-06-26 DIAGNOSIS — I10 Essential (primary) hypertension: Secondary | ICD-10-CM | POA: Diagnosis not present

## 2016-06-27 DIAGNOSIS — M5137 Other intervertebral disc degeneration, lumbosacral region: Secondary | ICD-10-CM | POA: Diagnosis not present

## 2016-06-27 DIAGNOSIS — G8929 Other chronic pain: Secondary | ICD-10-CM | POA: Diagnosis not present

## 2016-06-27 DIAGNOSIS — M5416 Radiculopathy, lumbar region: Secondary | ICD-10-CM | POA: Diagnosis not present

## 2016-06-27 DIAGNOSIS — I10 Essential (primary) hypertension: Secondary | ICD-10-CM | POA: Diagnosis not present

## 2016-06-27 DIAGNOSIS — M4802 Spinal stenosis, cervical region: Secondary | ICD-10-CM | POA: Diagnosis not present

## 2016-06-27 DIAGNOSIS — N390 Urinary tract infection, site not specified: Secondary | ICD-10-CM | POA: Diagnosis not present

## 2016-06-29 ENCOUNTER — Emergency Department (HOSPITAL_COMMUNITY): Payer: Medicare Other

## 2016-06-29 ENCOUNTER — Encounter (HOSPITAL_COMMUNITY): Payer: Self-pay | Admitting: Emergency Medicine

## 2016-06-29 ENCOUNTER — Inpatient Hospital Stay (HOSPITAL_COMMUNITY)
Admission: EM | Admit: 2016-06-29 | Discharge: 2016-07-02 | DRG: 552 | Disposition: A | Discharge status: Skilled Nursing Facility | Payer: Medicare Other | Attending: Internal Medicine | Admitting: Internal Medicine

## 2016-06-29 DIAGNOSIS — N179 Acute kidney failure, unspecified: Secondary | ICD-10-CM | POA: Diagnosis not present

## 2016-06-29 DIAGNOSIS — M4802 Spinal stenosis, cervical region: Secondary | ICD-10-CM | POA: Diagnosis present

## 2016-06-29 DIAGNOSIS — E890 Postprocedural hypothyroidism: Secondary | ICD-10-CM | POA: Diagnosis present

## 2016-06-29 DIAGNOSIS — R29818 Other symptoms and signs involving the nervous system: Secondary | ICD-10-CM | POA: Diagnosis not present

## 2016-06-29 DIAGNOSIS — Z7189 Other specified counseling: Secondary | ICD-10-CM

## 2016-06-29 DIAGNOSIS — G8929 Other chronic pain: Secondary | ICD-10-CM | POA: Diagnosis present

## 2016-06-29 DIAGNOSIS — Z66 Do not resuscitate: Secondary | ICD-10-CM | POA: Diagnosis present

## 2016-06-29 DIAGNOSIS — L8990 Pressure ulcer of unspecified site, unspecified stage: Secondary | ICD-10-CM | POA: Diagnosis present

## 2016-06-29 DIAGNOSIS — M6281 Muscle weakness (generalized): Secondary | ICD-10-CM | POA: Diagnosis present

## 2016-06-29 DIAGNOSIS — Z90710 Acquired absence of both cervix and uterus: Secondary | ICD-10-CM | POA: Diagnosis not present

## 2016-06-29 DIAGNOSIS — Z888 Allergy status to other drugs, medicaments and biological substances status: Secondary | ICD-10-CM

## 2016-06-29 DIAGNOSIS — M2578 Osteophyte, vertebrae: Secondary | ICD-10-CM | POA: Diagnosis present

## 2016-06-29 DIAGNOSIS — L89159 Pressure ulcer of sacral region, unspecified stage: Secondary | ICD-10-CM | POA: Diagnosis present

## 2016-06-29 DIAGNOSIS — E871 Hypo-osmolality and hyponatremia: Secondary | ICD-10-CM | POA: Diagnosis present

## 2016-06-29 DIAGNOSIS — R7989 Other specified abnormal findings of blood chemistry: Secondary | ICD-10-CM | POA: Diagnosis present

## 2016-06-29 DIAGNOSIS — K5900 Constipation, unspecified: Secondary | ICD-10-CM | POA: Diagnosis present

## 2016-06-29 DIAGNOSIS — Z880 Allergy status to penicillin: Secondary | ICD-10-CM

## 2016-06-29 DIAGNOSIS — M549 Dorsalgia, unspecified: Secondary | ICD-10-CM | POA: Diagnosis present

## 2016-06-29 DIAGNOSIS — R1312 Dysphagia, oropharyngeal phase: Secondary | ICD-10-CM | POA: Diagnosis not present

## 2016-06-29 DIAGNOSIS — M542 Cervicalgia: Secondary | ICD-10-CM

## 2016-06-29 DIAGNOSIS — R404 Transient alteration of awareness: Secondary | ICD-10-CM | POA: Diagnosis not present

## 2016-06-29 DIAGNOSIS — L89150 Pressure ulcer of sacral region, unstageable: Secondary | ICD-10-CM | POA: Diagnosis not present

## 2016-06-29 DIAGNOSIS — Z96651 Presence of right artificial knee joint: Secondary | ICD-10-CM

## 2016-06-29 DIAGNOSIS — H3530 Unspecified macular degeneration: Secondary | ICD-10-CM | POA: Diagnosis present

## 2016-06-29 DIAGNOSIS — Z85850 Personal history of malignant neoplasm of thyroid: Secondary | ICD-10-CM | POA: Diagnosis not present

## 2016-06-29 DIAGNOSIS — Z466 Encounter for fitting and adjustment of urinary device: Secondary | ICD-10-CM | POA: Diagnosis not present

## 2016-06-29 DIAGNOSIS — Z515 Encounter for palliative care: Secondary | ICD-10-CM

## 2016-06-29 DIAGNOSIS — G839 Paralytic syndrome, unspecified: Secondary | ICD-10-CM

## 2016-06-29 DIAGNOSIS — K219 Gastro-esophageal reflux disease without esophagitis: Secondary | ICD-10-CM | POA: Diagnosis present

## 2016-06-29 DIAGNOSIS — G8250 Quadriplegia, unspecified: Secondary | ICD-10-CM

## 2016-06-29 DIAGNOSIS — N281 Cyst of kidney, acquired: Secondary | ICD-10-CM | POA: Diagnosis not present

## 2016-06-29 DIAGNOSIS — H409 Unspecified glaucoma: Secondary | ICD-10-CM | POA: Diagnosis present

## 2016-06-29 DIAGNOSIS — R531 Weakness: Secondary | ICD-10-CM | POA: Diagnosis not present

## 2016-06-29 DIAGNOSIS — Z7401 Bed confinement status: Secondary | ICD-10-CM

## 2016-06-29 DIAGNOSIS — E860 Dehydration: Secondary | ICD-10-CM | POA: Diagnosis present

## 2016-06-29 DIAGNOSIS — Z885 Allergy status to narcotic agent status: Secondary | ICD-10-CM

## 2016-06-29 DIAGNOSIS — I10 Essential (primary) hypertension: Secondary | ICD-10-CM | POA: Diagnosis not present

## 2016-06-29 DIAGNOSIS — E875 Hyperkalemia: Secondary | ICD-10-CM | POA: Diagnosis present

## 2016-06-29 DIAGNOSIS — R778 Other specified abnormalities of plasma proteins: Secondary | ICD-10-CM | POA: Diagnosis present

## 2016-06-29 DIAGNOSIS — R748 Abnormal levels of other serum enzymes: Secondary | ICD-10-CM | POA: Diagnosis not present

## 2016-06-29 DIAGNOSIS — Z79891 Long term (current) use of opiate analgesic: Secondary | ICD-10-CM | POA: Diagnosis not present

## 2016-06-29 DIAGNOSIS — M4800 Spinal stenosis, site unspecified: Secondary | ICD-10-CM | POA: Diagnosis not present

## 2016-06-29 DIAGNOSIS — R627 Adult failure to thrive: Secondary | ICD-10-CM | POA: Diagnosis present

## 2016-06-29 LAB — CBC WITH DIFFERENTIAL/PLATELET
BASOS ABS: 0 10*3/uL (ref 0.0–0.1)
Basophils Relative: 0 %
EOS ABS: 0 10*3/uL (ref 0.0–0.7)
EOS PCT: 0 %
HCT: 37.6 % (ref 36.0–46.0)
HEMOGLOBIN: 12.7 g/dL (ref 12.0–15.0)
LYMPHS ABS: 1.2 10*3/uL (ref 0.7–4.0)
Lymphocytes Relative: 6 %
MCH: 31.2 pg (ref 26.0–34.0)
MCHC: 33.8 g/dL (ref 30.0–36.0)
MCV: 92.4 fL (ref 78.0–100.0)
MONO ABS: 1.4 10*3/uL — AB (ref 0.1–1.0)
MONOS PCT: 7 %
NEUTROS ABS: 16.7 10*3/uL — AB (ref 1.7–7.7)
Neutrophils Relative %: 87 %
Platelets: 385 10*3/uL (ref 150–400)
RBC: 4.07 MIL/uL (ref 3.87–5.11)
RDW: 13.4 % (ref 11.5–15.5)
WBC: 19.3 10*3/uL — AB (ref 4.0–10.5)

## 2016-06-29 LAB — I-STAT TROPONIN, ED: Troponin i, poc: 0.12 ng/mL (ref 0.00–0.08)

## 2016-06-29 LAB — APTT: aPTT: 33 seconds (ref 24–36)

## 2016-06-29 LAB — RAPID URINE DRUG SCREEN, HOSP PERFORMED
AMPHETAMINES: NOT DETECTED
BENZODIAZEPINES: NOT DETECTED
Barbiturates: NOT DETECTED
Cocaine: NOT DETECTED
OPIATES: POSITIVE — AB
Tetrahydrocannabinol: NOT DETECTED

## 2016-06-29 LAB — TROPONIN I
TROPONIN I: 0.24 ng/mL — AB (ref <0.03)
Troponin I: 0.21 ng/mL

## 2016-06-29 LAB — COMPREHENSIVE METABOLIC PANEL
ALBUMIN: 3.5 g/dL (ref 3.5–5.0)
ALT: 17 U/L (ref 14–54)
AST: 26 U/L (ref 15–41)
Alkaline Phosphatase: 78 U/L (ref 38–126)
Anion gap: 12 (ref 5–15)
BILIRUBIN TOTAL: 1 mg/dL (ref 0.3–1.2)
BUN: 129 mg/dL — AB (ref 6–20)
CO2: 23 mmol/L (ref 22–32)
CREATININE: 1.71 mg/dL — AB (ref 0.44–1.00)
Calcium: 8.6 mg/dL — ABNORMAL LOW (ref 8.9–10.3)
Chloride: 88 mmol/L — ABNORMAL LOW (ref 101–111)
GFR calc Af Amer: 30 mL/min — ABNORMAL LOW (ref >60)
GFR, EST NON AFRICAN AMERICAN: 26 mL/min — AB (ref >60)
GLUCOSE: 121 mg/dL — AB (ref 65–99)
Potassium: 5.6 mmol/L — ABNORMAL HIGH (ref 3.5–5.1)
Sodium: 123 mmol/L — ABNORMAL LOW (ref 135–145)
TOTAL PROTEIN: 6.5 g/dL (ref 6.5–8.1)

## 2016-06-29 LAB — LIPASE, BLOOD: LIPASE: 20 U/L (ref 11–51)

## 2016-06-29 LAB — ETHANOL

## 2016-06-29 LAB — URINALYSIS, ROUTINE W REFLEX MICROSCOPIC
BILIRUBIN URINE: NEGATIVE
Glucose, UA: NEGATIVE mg/dL
HGB URINE DIPSTICK: NEGATIVE
Ketones, ur: NEGATIVE mg/dL
Leukocytes, UA: NEGATIVE
Nitrite: NEGATIVE
PH: 5.5 (ref 5.0–8.0)
Protein, ur: NEGATIVE mg/dL
SPECIFIC GRAVITY, URINE: 1.018 (ref 1.005–1.030)

## 2016-06-29 LAB — POC OCCULT BLOOD, ED: Fecal Occult Bld: NEGATIVE

## 2016-06-29 LAB — PROTIME-INR
INR: 1.02
PROTHROMBIN TIME: 13.4 s (ref 11.4–15.2)

## 2016-06-29 MED ORDER — METOCLOPRAMIDE HCL 5 MG/ML IJ SOLN
5.0000 mg | Freq: Once | INTRAMUSCULAR | Status: AC
  Administered 2016-06-29: 5 mg via INTRAVENOUS
  Filled 2016-06-29: qty 2

## 2016-06-29 MED ORDER — SODIUM CHLORIDE 0.9 % IV SOLN
INTRAVENOUS | Status: AC
  Administered 2016-06-29: 22:00:00 via INTRAVENOUS

## 2016-06-29 MED ORDER — SODIUM CHLORIDE 0.9 % IV BOLUS (SEPSIS)
1000.0000 mL | Freq: Once | INTRAVENOUS | Status: AC
  Administered 2016-06-29: 1000 mL via INTRAVENOUS

## 2016-06-29 MED ORDER — IOPAMIDOL (ISOVUE-300) INJECTION 61%
75.0000 mL | Freq: Once | INTRAVENOUS | Status: AC | PRN
Start: 2016-06-29 — End: 2016-06-29
  Administered 2016-06-29: 75 mL via INTRAVENOUS

## 2016-06-29 MED ORDER — PROPRANOLOL HCL 10 MG PO TABS
20.0000 mg | ORAL_TABLET | Freq: Every day | ORAL | Status: DC
  Administered 2016-06-30 – 2016-07-02 (×3): 20 mg via ORAL
  Filled 2016-06-29 (×3): qty 2

## 2016-06-29 MED ORDER — THIAMINE HCL 100 MG/ML IJ SOLN
100.0000 mg | Freq: Once | INTRAMUSCULAR | Status: AC
  Administered 2016-06-29: 100 mg via INTRAVENOUS
  Filled 2016-06-29: qty 2

## 2016-06-29 MED ORDER — LEVOTHYROXINE SODIUM 25 MCG PO TABS
125.0000 ug | ORAL_TABLET | Freq: Every day | ORAL | Status: DC
  Administered 2016-06-30 – 2016-07-02 (×3): 125 ug via ORAL
  Filled 2016-06-29 (×3): qty 1

## 2016-06-29 MED ORDER — BRIMONIDINE TARTRATE 0.2 % OP SOLN
1.0000 [drp] | Freq: Two times a day (BID) | OPHTHALMIC | Status: DC
  Administered 2016-06-30 – 2016-07-02 (×6): 1 [drp] via OPHTHALMIC
  Filled 2016-06-29: qty 5

## 2016-06-29 MED ORDER — DORZOLAMIDE HCL-TIMOLOL MAL 2-0.5 % OP SOLN
1.0000 [drp] | Freq: Two times a day (BID) | OPHTHALMIC | Status: DC
  Administered 2016-06-30 – 2016-07-02 (×6): 1 [drp] via OPHTHALMIC
  Filled 2016-06-29: qty 10

## 2016-06-29 MED ORDER — LATANOPROST 0.005 % OP SOLN
1.0000 [drp] | Freq: Every day | OPHTHALMIC | Status: DC
  Administered 2016-06-30 – 2016-07-01 (×3): 1 [drp] via OPHTHALMIC
  Filled 2016-06-29: qty 2.5

## 2016-06-29 MED ORDER — OXYCODONE HCL ER 10 MG PO T12A
10.0000 mg | EXTENDED_RELEASE_TABLET | Freq: Every day | ORAL | Status: DC
  Administered 2016-06-30 – 2016-07-01 (×2): 10 mg via ORAL
  Filled 2016-06-29 (×2): qty 1

## 2016-06-29 NOTE — ED Notes (Signed)
Patient transported to CT 

## 2016-06-29 NOTE — ED Notes (Signed)
Bladder Scan: <967ml

## 2016-06-29 NOTE — H&P (Signed)
History and Physical    [name] [id number] DOB: [birth year] DOA: 06/29/2016  PCP: Merrilee Seashore, MD  Patient coming from: home  Chief Complaint:  Right side weak  HPI: [name] is a 82 y.o. female with medical history significant of significant cervical stenosis not a surgical candidate, decub ulcer was just d/c last week to home after recommended going to a rehab/SNF but she declined this offer comes back in for worsening right sided weakness and not eating very well.  She denies fevers.  She says she has not been able to get out of bed in 4 days as she cannot move her right side.  She is able to talk normally, and move her face normally.  She has been in a c- collar per neurosurgery recommendations at her discharge last week.  She only has her husband to take care of her at home.  She is still not wanted to go to a SNF.  Repeat imaging of her neck is pending however dr Trenton Gammon with neurosurgery at [facility] was called by EDP and recommended no surgical intervention irregardless of her imaging, that surgery would still not be considered due to her high risk.  He recommended for her to stay at [facility] long and get palliative care involved.  Pt found to be dehydrated, with AKI and worsening neurological symptoms in the setting of cpine stenosis.  Review of Systems: As per HPI otherwise 10 point review of systems negative.   Past Medical History:  Diagnosis Date  . Anemia   . Angina   . Arthritis   . Blood transfusion   . Chronic back pain   . Exertional dyspnea 01/17/12  . Fracture 2008   back  . GERD (gastroesophageal reflux disease)   . Glaucoma   . H/O hiatal hernia   . Headache(784.0)   . Hypertension   . Hypoglycemia   . Hypothyroidism   . Macular degeneration of both eyes   . Metabolic acidemia 123456  . Osteomyelitis ([place]) 11/2011   right knee  . Pneumonia 01/2010  . Shortness of breath 11/08/11   "here lately, all the time"  . Thyroid cancer ([place]) 1986      Past Surgical History:  Procedure Laterality Date  . ABDOMINAL HYSTERECTOMY  1963  . APPENDECTOMY    . BACK SURGERY     "3 back OR's;"; S/P "fractured back 2008"  . CARPAL TUNNEL RELEASE     bilaterally  . CATARACT EXTRACTION W/ INTRAOCULAR LENS  IMPLANT, BILATERAL    . DILATION AND CURETTAGE OF UTERUS    . FRACTURE SURGERY     "broke back"  . goiter removed  1980's   "when I was young"  . hand OR  09/2011   "left hand; bones were coming together"  . HIP ARTHROPLASTY Right 01/23/2016   Procedure: ARTHROPLASTY BIPOLAR HIP (HEMIARTHROPLASTY);  Surgeon: Gaynelle Arabian, MD;  Location: [facility];  Service: Orthopedics;  Laterality: Right;  Depuy  . I&D KNEE WITH POLY EXCHANGE  11/10/2011   Procedure: IRRIGATION AND DEBRIDEMENT KNEE WITH POLY EXCHANGE;  Surgeon: Yvette Rack., MD;  Location: [facility];  Service: Orthopedics;  Laterality: Right;  . JOINT REPLACEMENT  2005, 2006, 2012, 11/10/11   left; left; right; right; right  . PERIPHERALLY INSERTED CENTRAL CATHETER INSERTION  11/2011   "removed 12/2011; LUE"  . post polyethelene exchange  11/09/11   RLE  . SHOULDER ARTHROSCOPY W/ ROTATOR CUFF REPAIR    . THYROIDECTOMY  1986  .  TONSILLECTOMY AND ADENOIDECTOMY    . TOTAL KNEE REVISION  04/28/2012   Procedure: TOTAL KNEE REVISION;  Surgeon: Rudean Haskell, MD;  Location: [facility];  Service: Orthopedics;  Laterality: Right;  . TRIGGER FINGER RELEASE     right  thumb     reports that she has never smoked. She has never used smokeless tobacco. She reports that she does not drink alcohol or use drugs.  Allergies  Allergen Reactions  . Demerol Shortness Of Breath  . Penicillins Swelling    "in the face" Has patient had a PCN reaction causing immediate rash, facial/tongue/throat swelling, SOB or lightheadedness with hypotension: yes Has patient had a PCN reaction causing severe rash involving mucus membranes or skin necrosis: no Has patient had a PCN reaction that required hospitalization: no  Has  patient had a PCN reaction occurring within the last 10 years: no If all of the above answers are "NO", then may proceed with Cephalosporin use.   . Corticosteroids Other (See Comments)    DIZZINESS, HEADACHE  . Lorazepam Anxiety  . Tetracyclines & Related Rash  . Vancomycin Rash    Family History  Problem Relation Age of Onset  . Intracerebral hemorrhage Mother   . Stroke Father   . Parkinson's disease Brother     Prior to Admission medications   Medication Sig Start Date End Date Taking? Authorizing Provider  brimonidine (ALPHAGAN) 0.2 % ophthalmic solution Place 1 drop into both eyes 2 (two) times daily. 01/12/16   Historical Provider, MD  dorzolamide-timolol (COSOPT) 22.3-6.8 MG/ML ophthalmic solution Place 1 drop into both eyes 2 (two) times daily. 01/12/16   Historical Provider, MD  latanoprost (XALATAN) 0.005 % ophthalmic solution Place 1 drop into both eyes at bedtime.  01/05/13   Historical Provider, MD  levothyroxine (SYNTHROID, LEVOTHROID) 125 MCG tablet Take 1 tablet (125 mcg total) by mouth daily before breakfast. 06/24/16   Wallis Bamberg, MD  oxyCODONE (OXYCONTIN) 10 mg 12 hr tablet Take 1 tablet (10 mg total) by mouth daily. 06/23/16   Wallis Bamberg, MD  propranolol (INDERAL) 20 MG tablet Take 20 mg by mouth daily. Takes for migraines.    Historical Provider, MD  spironolactone (ALDACTONE) 25 MG tablet Take 25 mg by mouth 2 (two) times daily. 04/30/16   Historical Provider, MD    Physical Exam: Vitals:   06/29/16 1739 06/29/16 1900 06/29/16 1901 06/29/16 1959  BP:  157/57 157/57 155/56  Pulse:   90 104  Resp:  16 15 18   Temp: 97.7 F (36.5 C)   97.9 F (36.6 C)  TempSrc: Oral   Oral  SpO2:    100%    Constitutional: NAD, calm, comfortable Vitals:   06/29/16 1739 06/29/16 1900 06/29/16 1901 06/29/16 1959  BP:  157/57 157/57 155/56  Pulse:   90 104  Resp:  16 15 18   Temp: 97.7 F (36.5 C)   97.9 F (36.6 C)  TempSrc: Oral   Oral  SpO2:    100%    Eyes: PERRL, lids and conjunctivae normal ENMT: Mucous membranes are moist. Posterior pharynx clear of any exudate or lesions.Normal dentition.  Neck: normal, supple, no masses, no thyromegaly Respiratory: clear to auscultation bilaterally, no wheezing, no crackles. Normal respiratory effort. No accessory muscle use.  Cardiovascular: Regular rate and rhythm, no murmurs / rubs / gallops. No extremity edema. 2+ pedal pulses. No carotid bruits.  Abdomen: no tenderness, no masses palpated. No hepatosplenomegaly. Bowel sounds positive.  Musculoskeletal: no clubbing /  cyanosis. No joint deformity upper and lower extremities. Good ROM, no contractures. Normal muscle tone.  Skin: no rashes, lesions, ulcers. No induration Neurologic: CN 2-12 grossly intact. Sensation intact, DTR normal.  1/5 strength right side 4/5 strength left arm /leg. Psychiatric: Normal judgment and insight. Alert and oriented x 3. Normal mood.    Labs on Admission: I have personally reviewed following labs and imaging studies  CBC:  Recent Labs Lab 06/29/16 1809  WBC 19.3*  NEUTROABS 16.7*  HGB 12.7  HCT 37.6  MCV 92.4  PLT 0000000   Basic Metabolic Panel:  Recent Labs Lab 06/23/16 0454 06/29/16 1809  NA 132* 123*  K 3.7 5.6*  CL 97* 88*  CO2 25 23  GLUCOSE 129* 121*  BUN 46* 129*  CREATININE 0.75 1.71*  CALCIUM 8.7* 8.6*   GFR: Estimated Creatinine Clearance: 17.2 mL/min (by C-G formula based on SCr of 1.71 mg/dL (H)). Liver Function Tests:  Recent Labs Lab 06/29/16 1809  AST 26  ALT 17  ALKPHOS 78  BILITOT 1.0  PROT 6.5  ALBUMIN 3.5    Recent Labs Lab 06/29/16 1809  LIPASE 20    Coagulation Profile:  Recent Labs Lab 06/29/16 1809  INR 1.02   Cardiac Enzymes:  Recent Labs Lab 06/29/16 1809  TROPONINI 0.24*    Urine analysis:    Component Value Date/Time   COLORURINE YELLOW 06/29/2016 1820   APPEARANCEUR CLEAR 06/29/2016 1820   LABSPEC 1.018 06/29/2016 1820   PHURINE 5.5  06/29/2016 1820   GLUCOSEU NEGATIVE 06/29/2016 1820   HGBUR NEGATIVE 06/29/2016 1820   BILIRUBINUR NEGATIVE 06/29/2016 1820   KETONESUR NEGATIVE 06/29/2016 1820   PROTEINUR NEGATIVE 06/29/2016 1820   UROBILINOGEN 1.0 02/18/2014 1617   NITRITE NEGATIVE 06/29/2016 1820   LEUKOCYTESUR NEGATIVE 06/29/2016 1820      Radiological Exams on Admission: Ct Abdomen Pelvis W Contrast  Result Date: 06/29/2016 CLINICAL DATA:  Abdominal distention. Patient is more lethargic normal. EXAM: CT ABDOMEN AND PELVIS WITH CONTRAST TECHNIQUE: Multidetector CT imaging of the abdomen and pelvis was performed using the standard protocol following bolus administration of intravenous contrast. CONTRAST:  66mL ISOVUE-300 IOPAMIDOL (ISOVUE-300) INJECTION 61% COMPARISON:  CT from 02/18/2014 FINDINGS: Lower chest: Bibasilar atelectasis. No effusion or pneumothorax. Top normal size cardiac chambers. No pericardial effusion. Hepatobiliary: Colonic interposition over the liver is noted. There are least half a dozen or so well-circumscribed hypodensities scattered throughout the liver, the largest in the left hepatic lobe measuring 17 mm. These do not appear to enhance nor filled in on delayed repeat images through the upper abdomen suggesting hepatic cysts. No significant progression in size or number since prior. The gallbladder is free of stones. No wall thickening noted. No biliary dilatation. Pancreas: Atrophic pancreas without ductal dilatation. Spleen: No splenomegaly. Adrenals/Urinary Tract: Bilateral extrarenal pelves with chronic mild ectasia of the right renal collecting system. Tiny cortical cysts of both kidneys. There is mild ectasia of the right ureter to the level bladder. In the pelvis, the right distal ureter has a hyperdense appearance of (series 2 images 65 through 68 which may be due to streak artifacts from the patient's right bipolar hip arthroplasty. Alternatively intraluminal clots could have this appearance  however no hyperdensities are seen within the distended bladder to suggest hemorrhage. The bladder is distended and contains a Foley catheter with balloon which may explain the air-fluid level seen within probably from instrumentation. Stomach/Bowel: Moderate colonic stool burden with gaseous distention of large bowel low without wall thickening or evidence  of obstruction. Appendectomy. Vascular/Lymphatic: Aortic atherosclerosis. No enlarged abdominal or pelvic lymph nodes. Two calcified mesenteric densities just lateral to the right ureter, series 2 image 47 may represent calcified lymph nodes, measuring up to 6 mm short axis. Reproductive: Hysterectomy. Other: No ascites.  No abdominal wall hernia. Musculoskeletal: Lumbar fusion hardware noted from L3 through S1. No acute osseous abnormality IMPRESSION: Moderate fecal retention within large bowel without obstruction. No acute bowel inflammation. Distended bladder with Foley catheter seen within likely explain the large air-fluid level noted within bladder. Stable postop lumbar fusion and right hip arthroplasty. Stable hepatic and bilateral renal cysts. Electronically Signed   By: Ashley Royalty M.D.   On: 06/29/2016 19:05   Ct Head Code Stroke W/o Cm  Result Date: 06/29/2016 CLINICAL DATA:  Code stroke.  Left-sided weakness. EXAM: CT HEAD WITHOUT CONTRAST TECHNIQUE: Contiguous axial images were obtained from the base of the skull through the vertex without intravenous contrast. COMPARISON:  None. FINDINGS: Brain: No evidence of acute infarction, hemorrhage, hydrocephalus, extra-axial collection or mass lesion/mass effect. Mild chronic microvascular ischemic changes and parenchymal volume loss. Vascular: No hyperdense vessel. Calcific atherosclerosis of cavernous internal carotid arteries per Skull: Normal. Negative for fracture or focal lesion. Sinuses/Orbits: No acute finding. Other: None. ASPECTS [facility] Stroke Program Early CT Score) - Ganglionic level  infarction (caudate, lentiform nuclei, internal capsule, insula, M1-M3 cortex): 7 - Supraganglionic infarction (M4-M6 cortex): 3 Total score (0-10 with 10 being normal): 10 IMPRESSION: 1. No acute intracranial abnormality is identified. If symptoms persist or if clinically indicated MRI is more sensitive for acute stroke. 2. ASPECTS is 10 These results were called by telephone at the time of interpretation on 06/29/2016 at 6:52 pm to [place] , who verbally acknowledged these results. Electronically Signed   By: Kristine Garbe M.D.   On: 06/29/2016 18:54    EKG: Independently reviewed. nsr  Assessment/Plan 82 yo female with cpsine stenosis not a surgical candidate comes in with worsening right sided weakness, dehydration, FTT, AKI  Principal Problem:   Weakness of right side of body- stat ct of her cervical spine is pending at the request of [facility], who will review her films.  He does not feel the need for transfer at [facility] right now, as he advises no surgery.  I think the patient and her husband have very poor general insight into the seriousness of her condition.  I have advised her she will likely need more care than what can be provided at home but she wishes to remain at home despite her clear deterioration in the last week.  Consult palliative care.     Active Problems:   Spinal stenosis in cervical region- noted, ct scan pending   AKI (acute kidney injury) ([place])- ivf   Dehydration- ivf   Constipation- benign abd exam.  Start colace   Hyponatremia- due to dehydration   Elevated troponin- serial levels, likely due to renal function impairement   Hyperkalemia- ivf   Essential hypertension   Pressure injury of skin   Pt wishes no intubation, wants cpr  DVT prophylaxis: scds Code Status:  DNI Family Communication: husband at bedside Disposition Plan: per day team Consults called:   Neurosurgery, dr Trenton Gammon advises may not be seen until monday Admission  status:   admission   DAVID,RACHAL A MD [facility]  If 7PM-7AM, please contact night-coverage [email or web address] Password [email or web address]  06/29/2016, 8:29 PM

## 2016-06-29 NOTE — ED Triage Notes (Addendum)
Pt from home. Per EMS. EMS called by physical therapy who said that pt was more "lethargic" than normal. Pt alert and oriented. Pt has also has abd distention. Felt nauseated at home, and has not had a bowel movement in a week. Was admitted to [facility] for R side weakness 10 days ago.

## 2016-06-29 NOTE — ED Provider Notes (Signed)
[facility] DEPT Provider Note   CSN: [id number] Arrival date & time: 06/29/16  1719     History   Chief Complaint Chief Complaint  Patient presents with  . Weakness  . Abdominal Pain    HPI [name] is a 82 y.o. female.Complains of diminished appetite, pain at buttocksand vague abdominal discomfort for one week. She states her last bowel movement was one week ago. She feels constipated. Other associated symptoms include nausea, and diminished appetite. Patient has been bedbound, for the past 3-4 days. She presently is unable to move her left arm or left leg. She is currently unable to move right arm and right leg. Her stepson reports that she was able to walk until 3 or 4 days ago. HPI  Past Medical History:  Diagnosis Date  . Anemia   . Angina   . Arthritis   . Blood transfusion   . Chronic back pain   . Exertional dyspnea 01/17/12  . Fracture 2008   back  . GERD (gastroesophageal reflux disease)   . Glaucoma   . H/O hiatal hernia   . Headache(784.0)   . Hypertension   . Hypoglycemia   . Hypothyroidism   . Macular degeneration of both eyes   . Metabolic acidemia 123456  . Osteomyelitis ([place]) 11/2011   right knee  . Pneumonia 01/2010  . Shortness of breath 11/08/11   "here lately, all the time"  . Thyroid cancer [facility]) 1986    Patient Active Problem List   Diagnosis Date Noted  . Pressure injury of skin 06/21/2016  . Acute cystitis without hematuria   . Spinal stenosis in cervical region 06/20/2016  . Upper extremity weakness 06/20/2016  . UTI (urinary tract infection) 06/20/2016  . Spinal stenosis 06/20/2016  . Bilateral leg weakness 06/20/2016  . Delirium   . Essential hypertension   . Postoperative anemia due to acute blood loss 01/26/2016  . GERD (gastroesophageal reflux disease) 01/21/2016  . Fracture of femoral neck, right, closed ([place]) 01/21/2016  . Closed displaced fracture of right femoral neck ([place]) 01/21/2016  . Closed right hip  fracture ([place]) 01/21/2016  . Fall   . Odontoid fracture ([place])   . Protein-calorie malnutrition, moderate ([place]) 02/19/2014  . Severe diarrhea 02/18/2014  . Metabolic acidosis 99991111  . Weight loss, unintentional 02/18/2014  . Rash of neck 02/18/2014  . Hypothyroidism 02/18/2014  . Chronic anticoagulation 02/18/2014  . Tonsillar exudate 02/18/2014  . Lumbar radiculopathy 12/28/2013  . Cancer ([place]) 12/28/2013  . DDD (degenerative disc disease), lumbosacral 12/28/2013  . Glaucoma 12/28/2013  . Herpes zona 12/28/2013  . Chronic ulcer of skin ([place]) 12/28/2013  . Arthropathia 11/02/2013  . Failed back syndrome of lumbar spine 11/02/2013  . Anemia of unknown etiology 01/21/2012  . Hypokalemia 01/19/2012  . Anemia due to blood loss, acute 01/19/2012  . Infection of prosthetic knee joint ([place]) 01/18/2012  . Drug rash 12/14/2011  . Prosthetic joint infection ([place]) 12/14/2011  . GBS (group B streptococcus) infection 12/14/2011  . Hx of thyroid cancer 12/14/2011  . Bilateral lower extremity edema 12/14/2011  . Hypertension 01/05/2011    Past Surgical History:  Procedure Laterality Date  . ABDOMINAL HYSTERECTOMY  1963  . APPENDECTOMY    . BACK SURGERY     "3 back OR's;"; S/P "fractured back 2008"  . CARPAL TUNNEL RELEASE     bilaterally  . CATARACT EXTRACTION W/ INTRAOCULAR LENS  IMPLANT, BILATERAL    . DILATION AND CURETTAGE OF UTERUS    .  FRACTURE SURGERY     "broke back"  . goiter removed  1980's   "when I was young"  . hand OR  09/2011   "left hand; bones were coming together"  . HIP ARTHROPLASTY Right 01/23/2016   Procedure: ARTHROPLASTY BIPOLAR HIP (HEMIARTHROPLASTY);  Surgeon: Gaynelle Arabian, MD;  Location: [facility];  Service: Orthopedics;  Laterality: Right;  Depuy  . I&D KNEE WITH POLY EXCHANGE  11/10/2011   Procedure: IRRIGATION AND DEBRIDEMENT KNEE WITH POLY EXCHANGE;  Surgeon: Yvette Rack., MD;  Location: [place];  Service: Orthopedics;  Laterality: Right;  . JOINT  REPLACEMENT  2005, 2006, 2012, 11/10/11   left; left; right; right; right  . PERIPHERALLY INSERTED CENTRAL CATHETER INSERTION  11/2011   "removed 12/2011; LUE"  . post polyethelene exchange  11/09/11   RLE  . SHOULDER ARTHROSCOPY W/ ROTATOR CUFF REPAIR    . THYROIDECTOMY  1986  . TONSILLECTOMY AND ADENOIDECTOMY    . TOTAL KNEE REVISION  04/28/2012   Procedure: TOTAL KNEE REVISION;  Surgeon: Rudean Haskell, MD;  Location: [place];  Service: Orthopedics;  Laterality: Right;  . TRIGGER FINGER RELEASE     right  thumb    OB History    No data available       Home Medications    Prior to Admission medications   Medication Sig Start Date End Date Taking? Authorizing Provider  brimonidine (ALPHAGAN) 0.2 % ophthalmic solution Place 1 drop into both eyes 2 (two) times daily. 01/12/16   Historical Provider, MD  dorzolamide-timolol (COSOPT) 22.3-6.8 MG/ML ophthalmic solution Place 1 drop into both eyes 2 (two) times daily. 01/12/16   Historical Provider, MD  latanoprost (XALATAN) 0.005 % ophthalmic solution Place 1 drop into both eyes at bedtime.  01/05/13   Historical Provider, MD  levothyroxine (SYNTHROID, LEVOTHROID) 125 MCG tablet Take 1 tablet (125 mcg total) by mouth daily before breakfast. 06/24/16   Wallis Bamberg, MD  oxyCODONE (OXYCONTIN) 10 mg 12 hr tablet Take 1 tablet (10 mg total) by mouth daily. 06/23/16   Wallis Bamberg, MD  propranolol (INDERAL) 20 MG tablet Take 20 mg by mouth daily. Takes for migraines.    Historical Provider, MD  spironolactone (ALDACTONE) 25 MG tablet Take 25 mg by mouth 2 (two) times daily. 04/30/16   Historical Provider, MD    Family History Family History  Problem Relation Age of Onset  . Intracerebral hemorrhage Mother   . Stroke Father   . Parkinson's disease Brother     Social History Social History  Substance Use Topics  . Smoking status: Never Smoker  . Smokeless tobacco: Never Used  . Alcohol use No     Allergies   Demerol;  Penicillins; Corticosteroids; Lorazepam; Tetracyclines & related; and Vancomycin   Review of Systems Review of Systems  Constitutional: Positive for appetite change.  HENT: Negative.   Respiratory: Negative.   Cardiovascular: Negative.   Gastrointestinal: Positive for abdominal distention, abdominal pain, constipation and nausea. Negative for vomiting.  Musculoskeletal: Positive for back pain.       Pain in buttocks  Skin: Negative.   Neurological: Negative.   Psychiatric/Behavioral: Negative.   All other systems reviewed and are negative.    Physical Exam Updated Vital Signs BP (!) 126/51   Pulse 89   Temp 97.7 F (36.5 C) (Oral)   Resp 16   SpO2 98%   Physical Exam  Constitutional: She is oriented to person, place, and time.  Chronically ill, frail appearing  HENT:  Head: Normocephalic and atraumatic.  No facial asymmetry  Eyes: Conjunctivae are normal. Pupils are equal, round, and reactive to light.  Neck: Neck supple. No tracheal deviation present. No thyromegaly present.  Cardiovascular: Normal rate and regular rhythm.   No murmur heard. Pulmonary/Chest: Effort normal and breath sounds normal.  Abdominal: Soft. Bowel sounds are normal. She exhibits distension. There is no tenderness.  Diffuse tenderness  Genitourinary: Rectal exam shows guaiac negative stool.  Musculoskeletal: Normal range of motion. She exhibits no edema or tenderness.  Neurological: She is alert and oriented to person, place, and time. Coordination normal.  Almost Flaccid right arm and right leg., With motor strength of right arm and right leg 1/5 Motor strength of left arm and left leg3/5  Skin: Skin is warm and dry. No rash noted.  Silver dollar sized stage II decubitus ulcer presacral area  Psychiatric: She has a normal mood and affect.  Nursing note and vitals reviewed.    ED Treatments / Results  Labs (all labs ordered are listed, but only abnormal results are displayed) Labs  Reviewed  COMPREHENSIVE METABOLIC PANEL  CBC WITH DIFFERENTIAL/PLATELET  LIPASE, BLOOD  TROPONIN I  URINALYSIS, ROUTINE W REFLEX MICROSCOPIC (NOT AT [facility])  POC OCCULT BLOOD, ED    EKG  EKG Interpretation  Date/Time:  Friday June 29 2016 17:34:58 EDT Ventricular Rate:  89 PR Interval:    QRS Duration: 89 QT Interval:  365 QTC Calculation: 445 R Axis:   21 Text Interpretation:  Sinus rhythm LVH with secondary repolarization abnormality Confirmed by Winfred Leeds  MD, Pharrah Rottman [phone]) on 06/29/2016 5:56:47 PM       Radiology No results found.  Procedures Procedures (including critical care time)  Medications Ordered in ED Medications  sodium chloride 0.9 % bolus 1,000 mL (1,000 mLs Intravenous New Bag/Given 06/29/16 1814)  thiamine (B-1) injection 100 mg (100 mg Intravenous Given 06/29/16 1814)  metoCLOPramide (REGLAN) injection 5 mg (5 mg Intravenous Given 06/29/16 1814)   Results for orders placed or performed during the hospital encounter of 06/29/16  Comprehensive metabolic panel  Result Value Ref Range   Sodium 123 (L) 135 - 145 mmol/L   Potassium 5.6 (H) 3.5 - 5.1 mmol/L   Chloride 88 (L) 101 - 111 mmol/L   CO2 23 22 - 32 mmol/L   Glucose, Bld 121 (H) 65 - 99 mg/dL   BUN 129 (H) 6 - 20 mg/dL   Creatinine, Ser 1.71 (H) 0.44 - 1.00 mg/dL   Calcium 8.6 (L) 8.9 - 10.3 mg/dL   Total Protein 6.5 6.5 - 8.1 g/dL   Albumin 3.5 3.5 - 5.0 g/dL   AST 26 15 - 41 U/L   ALT 17 14 - 54 U/L   Alkaline Phosphatase 78 38 - 126 U/L   Total Bilirubin 1.0 0.3 - 1.2 mg/dL   GFR calc non Af Amer 26 (L) >60 mL/min   GFR calc Af Amer 30 (L) >60 mL/min   Anion gap 12 5 - 15  CBC with Differential/Platelet  Result Value Ref Range   WBC 19.3 (H) 4.0 - 10.5 K/uL   RBC 4.07 3.87 - 5.11 MIL/uL   Hemoglobin 12.7 12.0 - 15.0 g/dL   HCT 37.6 36.0 - 46.0 %   MCV 92.4 78.0 - 100.0 fL   MCH 31.2 26.0 - 34.0 pg   MCHC 33.8 30.0 - 36.0 g/dL   RDW 13.4 11.5 - 15.5 %   Platelets 385 150 - 400  K/uL  Neutrophils Relative % 87 %   Lymphocytes Relative 6 %   Monocytes Relative 7 %   Eosinophils Relative 0 %   Basophils Relative 0 %   Neutro Abs 16.7 (H) 1.7 - 7.7 K/uL   Lymphs Abs 1.2 0.7 - 4.0 K/uL   Monocytes Absolute 1.4 (H) 0.1 - 1.0 K/uL   Eosinophils Absolute 0.0 0.0 - 0.7 K/uL   Basophils Absolute 0.0 0.0 - 0.1 K/uL   Smear Review LARGE PLATELETS PRESENT   Lipase, blood  Result Value Ref Range   Lipase 20 11 - 51 U/L  Troponin I  Result Value Ref Range   Troponin I 0.24 (HH) <0.03 ng/mL  Ethanol  Result Value Ref Range   Alcohol, Ethyl (B) <5 <5 mg/dL  Protime-INR  Result Value Ref Range   Prothrombin Time 13.4 11.4 - 15.2 seconds   INR 1.02   APTT  Result Value Ref Range   aPTT 33 24 - 36 seconds  Urine rapid drug screen (hosp performed)not at [facility]  Result Value Ref Range   Opiates POSITIVE (A) NONE DETECTED   Cocaine NONE DETECTED NONE DETECTED   Benzodiazepines NONE DETECTED NONE DETECTED   Amphetamines NONE DETECTED NONE DETECTED   Tetrahydrocannabinol NONE DETECTED NONE DETECTED   Barbiturates NONE DETECTED NONE DETECTED  Urinalysis, Routine w reflex microscopic (not at [facility])  Result Value Ref Range   Color, Urine YELLOW YELLOW   APPearance CLEAR CLEAR   Specific Gravity, Urine 1.018 1.005 - 1.030   pH 5.5 5.0 - 8.0   Glucose, UA NEGATIVE NEGATIVE mg/dL   Hgb urine dipstick NEGATIVE NEGATIVE   Bilirubin Urine NEGATIVE NEGATIVE   Ketones, ur NEGATIVE NEGATIVE mg/dL   Protein, ur NEGATIVE NEGATIVE mg/dL   Nitrite NEGATIVE NEGATIVE   Leukocytes, UA NEGATIVE NEGATIVE  POC occult blood, ED  Result Value Ref Range   Fecal Occult Bld NEGATIVE NEGATIVE  I-stat troponin, ED (not at [facility])  Result Value Ref Range   Troponin i, poc 0.12 (HH) 0.00 - 0.08 ng/mL   Comment NOTIFIED PHYSICIAN    Comment 3           Ct Head Wo Contrast  Result Date: 06/19/2016 CLINICAL DATA:  Acute onset of right-sided headache, radiating down the right side of the  neck. High blood pressure. Initial encounter. EXAM: CT HEAD WITHOUT CONTRAST CT CERVICAL SPINE WITHOUT CONTRAST TECHNIQUE: Multidetector CT imaging of the head and cervical spine was performed following the standard protocol without intravenous contrast. Multiplanar CT image reconstructions of the cervical spine were also generated. COMPARISON:  CT of the head performed 04/25/2016, and CT of the cervical spine performed 03/29/2016. MRI of the cervical spine performed 05/16/2016 FINDINGS: CT HEAD FINDINGS Brain: No evidence of acute infarction, hemorrhage, hydrocephalus, extra-axial collection or mass lesion/mass effect. Prominence of the ventricles sulci reflects mild cortical volume loss. Scattered periventricular white matter change likely reflects small vessel ischemic microangiopathy. The brainstem and fourth ventricle are within normal limits. The basal ganglia are unremarkable in appearance. The cerebral hemispheres demonstrate grossly normal gray-white differentiation. No mass effect or midline shift is seen. Vascular: No hyperdense vessel or unexpected calcification. Skull: There is no evidence of fracture; visualized osseous structures are unremarkable in appearance. Sinuses/Orbits: The orbits are within normal limits. The paranasal sinuses and mastoid air cells are well-aerated. Other: No significant soft tissue abnormalities are seen. CT CERVICAL SPINE FINDINGS Alignment: Normal. Skull base and vertebrae: No acute fracture. No primary bone lesion or focal pathologic process.  There is a chronic relatively stable fracture of the upper dens, grossly unchanged from the prior studies. Soft tissues and spinal canal: No prevertebral fluid or swelling. No visible canal hematoma. Chronic central canal stenosis at C1-C2 reflects the fracture described above. Disc levels: Multilevel disc space narrowing is noted, with loss of the disc spaces at C2-C4, reflecting some degree of osseous fusion. Scattered anterior and  posterior disc osteophyte complexes are noted along the cervical and upper thoracic spine. Upper chest: Minimal scarring is noted at the lung apices. The patient is status post thyroidectomy. Other: No additional soft tissue abnormalities are seen. IMPRESSION: 1. No evidence of traumatic intracranial injury or fracture. 2. No evidence of fracture or subluxation along the cervical spine. 3. Mild cortical volume loss and scattered small vessel ischemic microangiopathy. 4. Diffuse degenerative change along the cervical spine, with chronic osseous fusion at C2-C4. 5. Chronic relatively stable fracture of the upper dens, with underlying chronic central canal stenosis, better characterized on prior MRI. 6. Minimal scarring at the lung apices. Electronically Signed   By: Garald Balding M.D.   On: 06/19/2016 20:51   Ct Cervical Spine Wo Contrast  Result Date: 06/19/2016 CLINICAL DATA:  Acute onset of right-sided headache, radiating down the right side of the neck. High blood pressure. Initial encounter. EXAM: CT HEAD WITHOUT CONTRAST CT CERVICAL SPINE WITHOUT CONTRAST TECHNIQUE: Multidetector CT imaging of the head and cervical spine was performed following the standard protocol without intravenous contrast. Multiplanar CT image reconstructions of the cervical spine were also generated. COMPARISON:  CT of the head performed 04/25/2016, and CT of the cervical spine performed 03/29/2016. MRI of the cervical spine performed 05/16/2016 FINDINGS: CT HEAD FINDINGS Brain: No evidence of acute infarction, hemorrhage, hydrocephalus, extra-axial collection or mass lesion/mass effect. Prominence of the ventricles sulci reflects mild cortical volume loss. Scattered periventricular white matter change likely reflects small vessel ischemic microangiopathy. The brainstem and fourth ventricle are within normal limits. The basal ganglia are unremarkable in appearance. The cerebral hemispheres demonstrate grossly normal gray-white  differentiation. No mass effect or midline shift is seen. Vascular: No hyperdense vessel or unexpected calcification. Skull: There is no evidence of fracture; visualized osseous structures are unremarkable in appearance. Sinuses/Orbits: The orbits are within normal limits. The paranasal sinuses and mastoid air cells are well-aerated. Other: No significant soft tissue abnormalities are seen. CT CERVICAL SPINE FINDINGS Alignment: Normal. Skull base and vertebrae: No acute fracture. No primary bone lesion or focal pathologic process. There is a chronic relatively stable fracture of the upper dens, grossly unchanged from the prior studies. Soft tissues and spinal canal: No prevertebral fluid or swelling. No visible canal hematoma. Chronic central canal stenosis at C1-C2 reflects the fracture described above. Disc levels: Multilevel disc space narrowing is noted, with loss of the disc spaces at C2-C4, reflecting some degree of osseous fusion. Scattered anterior and posterior disc osteophyte complexes are noted along the cervical and upper thoracic spine. Upper chest: Minimal scarring is noted at the lung apices. The patient is status post thyroidectomy. Other: No additional soft tissue abnormalities are seen. IMPRESSION: 1. No evidence of traumatic intracranial injury or fracture. 2. No evidence of fracture or subluxation along the cervical spine. 3. Mild cortical volume loss and scattered small vessel ischemic microangiopathy. 4. Diffuse degenerative change along the cervical spine, with chronic osseous fusion at C2-C4. 5. Chronic relatively stable fracture of the upper dens, with underlying chronic central canal stenosis, better characterized on prior MRI. 6. Minimal scarring at the  lung apices. Electronically Signed   By: Garald Balding M.D.   On: 06/19/2016 20:51   Mr Brain Wo Contrast  Addendum Date: 06/20/2016   ADDENDUM REPORT: 06/20/2016 09:52 ADDENDUM: Critical Value/emergent results were called by  telephone at the time of interpretation on 06/20/2016 at 0935 hours to ED PA Alecia Lemming, who verbally acknowledged these results. Electronically Signed   By: Genevie Ann M.D.   On: 06/20/2016 09:52   Result Date: 06/20/2016 CLINICAL DATA:  82 year old female with progressive right side weakness/paralysis x1 month. C1 vertebral and Type 2 dens fractures sustained in May with subsequent C1-C2 spinal stenosis. Initial encounter. EXAM: MRI HEAD WITHOUT CONTRAST MRI CERVICAL SPINE WITHOUT CONTRAST TECHNIQUE: Multiplanar, multiecho pulse sequences of the brain and surrounding structures, and cervical spine, to include the craniocervical junction and cervicothoracic junction, were obtained without intravenous contrast. COMPARISON:  Head and cervical spine CT 06/19/2016 and earlier. Cervical spine MRI 05/16/2016. FINDINGS: MRI HEAD FINDINGS Brain: No restricted diffusion to suggest acute infarction. No midline shift, mass effect, evidence of mass lesion, ventriculomegaly, extra-axial collection or acute intracranial hemorrhage. Pituitary within normal limits. [place] Cables and white matter signal is within normal limits for age throughout the brain. No abnormal signal in the brainstem. Vascular: Major intracranial vascular flow voids appear within normal limits. Skull and upper cervical spine: Reported below. Sinuses/Orbits: Postoperative changes the globes, otherwise negative orbits soft tissues. Visualized paranasal sinuses and mastoids are stable and well pneumatized. Other: Visible internal auditory structures appear normal. Negative scalp soft tissues. MRI CERVICAL SPINE FINDINGS Study is intermittently degraded by motion artifact despite repeated imaging attempts. Alignment: C2 through C4 interbody and posterior element ankylosis. Superimposed left side C4-C5 posterior element ankylosis and C5-C6 interbody ankylosis. Subsequently, the cervical spine is essentially fused from the C2 body to the C6 level. Alignment of this fused  segments is stable over these series of exams. Un healed type 2 odontoid fracture persists as seen on the CT yesterday. Vertebrae:   No new osseous abnormality identified. Cord: Ligamentous hypertrophy about the odontoid and especially posterior ligamentous hypertrophy about the posterior C1 ring again noted and combine for moderate spinal stenosis at the C1-C2 level (series 16, image 11 and series 14, image 7) which appears mildly progressed since September. Subsequent moderate mass effect on the spinal cord in the AP direction (series 16, image 11). No definite cord edema. Below C1-C2 there is additional multilevel cervical spinal stenosis. No definite cord signal abnormality at those levels. Visualized upper thoracic spinal cord appears within normal limits. Posterior Fossa, vertebral arteries, paraspinal tissues: Posterior fossa detailed above. New since 05/16/2016 abnormal increased prevertebral confluent T2 and STIR hyperintensity from the skullbase to C6-C7 (series 14, image 5). New confluent abnormal posterior paraspinal muscle edema (seen in the midline on also series 14, image 5). Other bilateral neck soft tissues appear stable. Preserved major vascular flow voids. Partially retropharyngeal course of both carotids. Disc levels: Degenerative multifactorial spinal stenosis at C2-C3, C3-C4, C4-C5, C5-C6, and C6-C7 appears stable since September. IMPRESSION: 1. Mild further progression since September of moderate C1-C2 spinal stenosis with upper cervical cord compression due to ligamentous hypertrophy and mild bony displacement about the un-healed type 2 odontoid fracture. 2. No definite cervical spinal cord edema or signal abnormality. Multifactorial degenerative spinal stenosis also from C2-C3 through C6-C7 appears stable since September, including some lesser cord mass effect. 3. New diffuse abnormal signal in the posterior paraspinal muscles. If there has been no interval trauma then perhaps this reflects  widespread a  Muscle Denervation Injury related to the progressive compressive myelopathy. 4. Increased abnormal prevertebral soft tissue edema also noted, nonspecific. 5. Underlying chronic cervical spine ankylosis from the C2 body to the C6 vertebra. 6. No acute intracranial abnormality. Normal for age noncontrast MRI appearance of the brain. Electronically Signed: By: Genevie Ann M.D. On: 06/20/2016 09:30   Mr Cervical Spine Wo Contrast  Addendum Date: 06/20/2016   ADDENDUM REPORT: 06/20/2016 09:52 ADDENDUM: Critical Value/emergent results were called by telephone at the time of interpretation on 06/20/2016 at 0935 hours to ED PA Alecia Lemming, who verbally acknowledged these results. Electronically Signed   By: Genevie Ann M.D.   On: 06/20/2016 09:52   Result Date: 06/20/2016 CLINICAL DATA:  82 year old female with progressive right side weakness/paralysis x1 month. C1 vertebral and Type 2 dens fractures sustained in May with subsequent C1-C2 spinal stenosis. Initial encounter. EXAM: MRI HEAD WITHOUT CONTRAST MRI CERVICAL SPINE WITHOUT CONTRAST TECHNIQUE: Multiplanar, multiecho pulse sequences of the brain and surrounding structures, and cervical spine, to include the craniocervical junction and cervicothoracic junction, were obtained without intravenous contrast. COMPARISON:  Head and cervical spine CT 06/19/2016 and earlier. Cervical spine MRI 05/16/2016. FINDINGS: MRI HEAD FINDINGS Brain: No restricted diffusion to suggest acute infarction. No midline shift, mass effect, evidence of mass lesion, ventriculomegaly, extra-axial collection or acute intracranial hemorrhage. Pituitary within normal limits. [place] Cables and white matter signal is within normal limits for age throughout the brain. No abnormal signal in the brainstem. Vascular: Major intracranial vascular flow voids appear within normal limits. Skull and upper cervical spine: Reported below. Sinuses/Orbits: Postoperative changes the globes, otherwise negative  orbits soft tissues. Visualized paranasal sinuses and mastoids are stable and well pneumatized. Other: Visible internal auditory structures appear normal. Negative scalp soft tissues. MRI CERVICAL SPINE FINDINGS Study is intermittently degraded by motion artifact despite repeated imaging attempts. Alignment: C2 through C4 interbody and posterior element ankylosis. Superimposed left side C4-C5 posterior element ankylosis and C5-C6 interbody ankylosis. Subsequently, the cervical spine is essentially fused from the C2 body to the C6 level. Alignment of this fused segments is stable over these series of exams. Un healed type 2 odontoid fracture persists as seen on the CT yesterday. Vertebrae:   No new osseous abnormality identified. Cord: Ligamentous hypertrophy about the odontoid and especially posterior ligamentous hypertrophy about the posterior C1 ring again noted and combine for moderate spinal stenosis at the C1-C2 level (series 16, image 11 and series 14, image 7) which appears mildly progressed since September. Subsequent moderate mass effect on the spinal cord in the AP direction (series 16, image 11). No definite cord edema. Below C1-C2 there is additional multilevel cervical spinal stenosis. No definite cord signal abnormality at those levels. Visualized upper thoracic spinal cord appears within normal limits. Posterior Fossa, vertebral arteries, paraspinal tissues: Posterior fossa detailed above. New since 05/16/2016 abnormal increased prevertebral confluent T2 and STIR hyperintensity from the skullbase to C6-C7 (series 14, image 5). New confluent abnormal posterior paraspinal muscle edema (seen in the midline on also series 14, image 5). Other bilateral neck soft tissues appear stable. Preserved major vascular flow voids. Partially retropharyngeal course of both carotids. Disc levels: Degenerative multifactorial spinal stenosis at C2-C3, C3-C4, C4-C5, C5-C6, and C6-C7 appears stable since September.  IMPRESSION: 1. Mild further progression since September of moderate C1-C2 spinal stenosis with upper cervical cord compression due to ligamentous hypertrophy and mild bony displacement about the un-healed type 2 odontoid fracture. 2. No definite cervical spinal cord edema or signal abnormality. Multifactorial  degenerative spinal stenosis also from C2-C3 through C6-C7 appears stable since September, including some lesser cord mass effect. 3. New diffuse abnormal signal in the posterior paraspinal muscles. If there has been no interval trauma then perhaps this reflects widespread a Muscle Denervation Injury related to the progressive compressive myelopathy. 4. Increased abnormal prevertebral soft tissue edema also noted, nonspecific. 5. Underlying chronic cervical spine ankylosis from the C2 body to the C6 vertebra. 6. No acute intracranial abnormality. Normal for age noncontrast MRI appearance of the brain. Electronically Signed: By: Genevie Ann M.D. On: 06/20/2016 09:30   Ct Abdomen Pelvis W Contrast  Result Date: 06/29/2016 CLINICAL DATA:  Abdominal distention. Patient is more lethargic normal. EXAM: CT ABDOMEN AND PELVIS WITH CONTRAST TECHNIQUE: Multidetector CT imaging of the abdomen and pelvis was performed using the standard protocol following bolus administration of intravenous contrast. CONTRAST:  8mL ISOVUE-300 IOPAMIDOL (ISOVUE-300) INJECTION 61% COMPARISON:  CT from 02/18/2014 FINDINGS: Lower chest: Bibasilar atelectasis. No effusion or pneumothorax. Top normal size cardiac chambers. No pericardial effusion. Hepatobiliary: Colonic interposition over the liver is noted. There are least half a dozen or so well-circumscribed hypodensities scattered throughout the liver, the largest in the left hepatic lobe measuring 17 mm. These do not appear to enhance nor filled in on delayed repeat images through the upper abdomen suggesting hepatic cysts. No significant progression in size or number since prior. The  gallbladder is free of stones. No wall thickening noted. No biliary dilatation. Pancreas: Atrophic pancreas without ductal dilatation. Spleen: No splenomegaly. Adrenals/Urinary Tract: Bilateral extrarenal pelves with chronic mild ectasia of the right renal collecting system. Tiny cortical cysts of both kidneys. There is mild ectasia of the right ureter to the level bladder. In the pelvis, the right distal ureter has a hyperdense appearance of (series 2 images 65 through 68 which may be due to streak artifacts from the patient's right bipolar hip arthroplasty. Alternatively intraluminal clots could have this appearance however no hyperdensities are seen within the distended bladder to suggest hemorrhage. The bladder is distended and contains a Foley catheter with balloon which may explain the air-fluid level seen within probably from instrumentation. Stomach/Bowel: Moderate colonic stool burden with gaseous distention of large bowel low without wall thickening or evidence of obstruction. Appendectomy. Vascular/Lymphatic: Aortic atherosclerosis. No enlarged abdominal or pelvic lymph nodes. Two calcified mesenteric densities just lateral to the right ureter, series 2 image 47 may represent calcified lymph nodes, measuring up to 6 mm short axis. Reproductive: Hysterectomy. Other: No ascites.  No abdominal wall hernia. Musculoskeletal: Lumbar fusion hardware noted from L3 through S1. No acute osseous abnormality IMPRESSION: Moderate fecal retention within large bowel without obstruction. No acute bowel inflammation. Distended bladder with Foley catheter seen within likely explain the large air-fluid level noted within bladder. Stable postop lumbar fusion and right hip arthroplasty. Stable hepatic and bilateral renal cysts. Electronically Signed   By: Ashley Royalty M.D.   On: 06/29/2016 19:05   Ct Head Code Stroke W/o Cm  Result Date: 06/29/2016 CLINICAL DATA:  Code stroke.  Left-sided weakness. EXAM: CT HEAD WITHOUT  CONTRAST TECHNIQUE: Contiguous axial images were obtained from the base of the skull through the vertex without intravenous contrast. COMPARISON:  None. FINDINGS: Brain: No evidence of acute infarction, hemorrhage, hydrocephalus, extra-axial collection or mass lesion/mass effect. Mild chronic microvascular ischemic changes and parenchymal volume loss. Vascular: No hyperdense vessel. Calcific atherosclerosis of cavernous internal carotid arteries per Skull: Normal. Negative for fracture or focal lesion. Sinuses/Orbits: No acute finding. Other: None.  ASPECTS [facility] Stroke Program Early CT Score) - Ganglionic level infarction (caudate, lentiform nuclei, internal capsule, insula, M1-M3 cortex): 7 - Supraganglionic infarction (M4-M6 cortex): 3 Total score (0-10 with 10 being normal): 10 IMPRESSION: 1. No acute intracranial abnormality is identified. If symptoms persist or if clinically indicated MRI is more sensitive for acute stroke. 2. ASPECTS is 10 These results were called by telephone at the time of interpretation on 06/29/2016 at 6:52 pm to [place] , who verbally acknowledged these results. Electronically Signed   By: Kristine Garbe M.D.   On: 06/29/2016 18:54    Initial Impression / Assessment and Plan / ED Course  I have reviewed the triage vital signs and the nursing notes.  Pertinent labs & imaging results that were available during my care of the patient were reviewed by me and considered in my medical decision making (see chart for details).  Clinical Course    Case discussed with Dr. Trenton Gammon, neurosurgery who requested CT scan of cervical spine without contrast. He states. Patient is not an operative candidate, and his candidate for palliativecare/hospice. Patient incidentally states that she would not want surgery Palliative care service consulted as well as Dr. Shanon Brow from hospitalist service who will see patient in the ED. Patient has stated that if her heart were to stop she does  not want "life support" but would want CPR Patient will be admitted to medical surgical floor Final Clinical Impressions(s) / ED Diagnoses  Diagnoses #1 paralysis  #2 constipation #3 presacral decubitus ulceracute kidney injury #4 acute kidney injury Final diagnoses:  None   #4 hyponatremia New Prescriptions New Prescriptions   No medications on file     Orlie Dakin, MD 06/29/16 2342

## 2016-06-30 DIAGNOSIS — I10 Essential (primary) hypertension: Secondary | ICD-10-CM

## 2016-06-30 DIAGNOSIS — Z515 Encounter for palliative care: Secondary | ICD-10-CM

## 2016-06-30 DIAGNOSIS — Z7189 Other specified counseling: Secondary | ICD-10-CM

## 2016-06-30 DIAGNOSIS — N179 Acute kidney failure, unspecified: Secondary | ICD-10-CM

## 2016-06-30 DIAGNOSIS — R748 Abnormal levels of other serum enzymes: Secondary | ICD-10-CM

## 2016-06-30 DIAGNOSIS — E860 Dehydration: Secondary | ICD-10-CM

## 2016-06-30 DIAGNOSIS — R531 Weakness: Secondary | ICD-10-CM

## 2016-06-30 DIAGNOSIS — M542 Cervicalgia: Secondary | ICD-10-CM

## 2016-06-30 LAB — BASIC METABOLIC PANEL
ANION GAP: 8 (ref 5–15)
BUN: 98 mg/dL — ABNORMAL HIGH (ref 6–20)
CHLORIDE: 99 mmol/L — AB (ref 101–111)
CO2: 21 mmol/L — AB (ref 22–32)
CREATININE: 1.08 mg/dL — AB (ref 0.44–1.00)
Calcium: 7.7 mg/dL — ABNORMAL LOW (ref 8.9–10.3)
GFR calc non Af Amer: 44 mL/min — ABNORMAL LOW (ref >60)
GFR, EST AFRICAN AMERICAN: 52 mL/min — AB (ref >60)
Glucose, Bld: 155 mg/dL — ABNORMAL HIGH (ref 65–99)
Potassium: 4.1 mmol/L (ref 3.5–5.1)
SODIUM: 128 mmol/L — AB (ref 135–145)

## 2016-06-30 LAB — CBC
HCT: 29.3 % — ABNORMAL LOW (ref 36.0–46.0)
HEMOGLOBIN: 9.9 g/dL — AB (ref 12.0–15.0)
MCH: 31.3 pg (ref 26.0–34.0)
MCHC: 33.8 g/dL (ref 30.0–36.0)
MCV: 92.7 fL (ref 78.0–100.0)
PLATELETS: 336 10*3/uL (ref 150–400)
RBC: 3.16 MIL/uL — AB (ref 3.87–5.11)
RDW: 13.6 % (ref 11.5–15.5)
WBC: 15.5 10*3/uL — AB (ref 4.0–10.5)

## 2016-06-30 LAB — TROPONIN I
Troponin I: 0.21 ng/mL (ref <0.03)
Troponin I: 0.18 ng/mL (ref <0.03)

## 2016-06-30 MED ORDER — TRAZODONE HCL 50 MG PO TABS
50.0000 mg | ORAL_TABLET | Freq: Once | ORAL | Status: AC
  Administered 2016-06-30: 50 mg via ORAL
  Filled 2016-06-30: qty 1

## 2016-06-30 MED ORDER — OXYCODONE-ACETAMINOPHEN 5-325 MG PO TABS
1.0000 | ORAL_TABLET | ORAL | Status: DC | PRN
  Administered 2016-06-30 – 2016-07-01 (×3): 1 via ORAL
  Filled 2016-06-30 (×4): qty 1

## 2016-06-30 MED ORDER — OXYCODONE HCL 5 MG PO TABS
5.0000 mg | ORAL_TABLET | Freq: Once | ORAL | Status: AC
  Administered 2016-06-30: 5 mg via ORAL
  Filled 2016-06-30: qty 1

## 2016-06-30 MED ORDER — KETOROLAC TROMETHAMINE 15 MG/ML IJ SOLN
15.0000 mg | Freq: Four times a day (QID) | INTRAMUSCULAR | Status: DC | PRN
  Administered 2016-06-30 – 2016-07-02 (×5): 15 mg via INTRAVENOUS
  Filled 2016-06-30 (×5): qty 1

## 2016-06-30 MED ORDER — MORPHINE SULFATE (PF) 2 MG/ML IV SOLN
2.0000 mg | INTRAVENOUS | Status: AC
  Administered 2016-06-30: 2 mg via INTRAVENOUS
  Filled 2016-06-30: qty 1

## 2016-06-30 MED ORDER — MORPHINE SULFATE (PF) 2 MG/ML IV SOLN
2.0000 mg | INTRAVENOUS | Status: DC | PRN
  Administered 2016-06-30 – 2016-07-01 (×4): 2 mg via INTRAVENOUS
  Filled 2016-06-30 (×4): qty 1

## 2016-06-30 MED ORDER — LIP MEDEX EX OINT
TOPICAL_OINTMENT | CUTANEOUS | Status: AC
  Administered 2016-06-30
  Filled 2016-06-30: qty 7

## 2016-06-30 NOTE — Progress Notes (Signed)
Pt's blood pressure taken at 0522 was 114/45; pulse rate 90; NP on call paged and notified of vital signs readings but gave no further order; will continue to monitor.

## 2016-06-30 NOTE — Progress Notes (Signed)
Met with patient and her husband.  We discussed that in light of multiple chronic medical problems that have worsened with this acute problem, care should be focused on interventions that are likely to allow the patient to achieve goal of getting back to home and spending time with family. I discussed with family regarding heroic interventions at the end-of-life and they agree this would not be in line with prior expressed wishes for a natural death or be likely to lead to getting well enough to go back home. They were in agreement with changing CODE STATUS to DO NOT RESUSCITATE.  Full consult to follow.  Micheline Rough, MD [facility] [facility] [phone]

## 2016-06-30 NOTE — Progress Notes (Signed)
PROGRESS NOTE    [name]  [id number] DOB: Oct 24, 1924 DOA: 06/29/2016 PCP: Merrilee Seashore, MD    Brief Narrative:  82 y.o. female with medical history significant of significant cervical stenosis not a surgical candidate, decub ulcer was just d/c last week to home after recommended going to a rehab/SNF but she declined this offer comes back in for worsening right sided weakness and not eating very well.  She denies fevers.  She says she has not been able to get out of bed in 4 days as she cannot move her right side.  She is able to talk normally, and move her face normally.  She has been in a c- collar per neurosurgery recommendations at her discharge last week.  She only has her husband to take care of her at home.  She is still not wanted to go to a SNF.  Repeat imaging of her neck is pending however dr Trenton Gammon with neurosurgery at [facility] was called by EDP and recommended no surgical intervention irregardless of her imaging, that surgery would still not be considered due to her high risk.  He recommended for her to stay at [facility] long and get palliative care involved.  Pt found to be dehydrated, with AKI and worsening neurological symptoms in the setting of cpine stenosis.  Assessment & Plan:   Principal Problem:   Weakness of right side of body Active Problems:   Spinal stenosis in cervical region   Essential hypertension   Pressure injury of skin   AKI (acute kidney injury) (HCC)   Dehydration   Constipation   Hyponatremia   Elevated troponin   Hyperkalemia   Quadriparesis ([place])   1. Right-sided weakness secondary to cervical spinal stenosis 1. Patient with severe cervical stenosis resulting in right-sided weakness 2. History and physical reviewed. Case was discussed with neurosurgeon on call at time of admission. Patient considered too high risk for surgery, recommendations instead for palliative measures 3. Out of care consulted, discussed case with Dr. Domingo Cocking,  appreciate input 4. Continue analgesics as needed 2. Hypertension 1. Blood pressure currently stable 2. Continue regimen for now 3. Scheer force injury 1. Wound ostomy nurse consulted. Appreciate input 2. Wound care recommendations are noted 4. Acute renal failure 1. Patient continued on IV fluid hydration overnight 2. He'll function has improved 5. Dehydration 1. Improved with IV fluids 6. Hyponatremia 1. Suspect secondary to dehydration. Sodium has improved with hydration  DVT prophylaxis: SCDs Code Status: DO NOT RESUSCITATE Family Communication: Patient in room, family at bedside Disposition Plan: Uncertain at this time  Consultants:   Palliative care  Case discussed with neurosurgeon at time of admission  Procedures:     Antimicrobials: Anti-infectives    None      Subjective: Complains of neck pain and headache  Objective: Vitals:   06/29/16 2320 06/30/16 0002 06/30/16 0522 06/30/16 1500  BP: (!) 156/94  (!) 114/45 (!) 110/42  Pulse: (!) 103  90 71  Resp: 15  20 18   Temp:  97.4 F (36.3 C) 97.7 F (36.5 C) 97.9 F (36.6 C)  TempSrc:  Oral Oral Oral  SpO2: 99%  97% 95%  Weight: 61.8 kg (136 lb 3.9 oz)     Height: 5' 1.5" (1.562 m)       Intake/Output Summary (Last 24 hours) at 06/30/16 1702 Last data filed at 06/30/16 1500  Gross per 24 hour  Intake             5280 ml  Output             3100 ml  Net             2180 ml   Filed Weights   06/29/16 2320  Weight: 61.8 kg (136 lb 3.9 oz)    Examination:  General exam: Appears calm and comfortable  Respiratory system: Clear to auscultation. Respiratory effort normal. Cardiovascular system: S1 & S2 heard, RRR. Gastrointestinal system: Abdomen is nondistended, soft and nontender. No organomegaly or masses felt. Normal bowel sounds heard. Central nervous system: Alert and oriented.Right upper extremity with 1-2 out of 5 strength, 2-3 out of 5 strength in right lower extremity Extremities:  Symmetric 5 x 5 power. Skin: No rashes, lesions or ulcers Psychiatry: Judgement and insight appear normal. Mood & affect appropriate.   Data Reviewed: I have personally reviewed following labs and imaging studies  CBC:  Recent Labs Lab 06/29/16 1809 06/30/16 0421  WBC 19.3* 15.5*  NEUTROABS 16.7*  --   HGB 12.7 9.9*  HCT 37.6 29.3*  MCV 92.4 92.7  PLT 385 [id number]   Basic Metabolic Panel:  Recent Labs Lab 06/29/16 1809 06/30/16 0421  NA 123* 128*  K 5.6* 4.1  CL 88* 99*  CO2 23 21*  GLUCOSE 121* 155*  BUN 129* 98*  CREATININE 1.71* 1.08*  CALCIUM 8.6* 7.7*   GFR: Estimated Creatinine Clearance: 30.8 mL/min (by C-G formula based on SCr of 1.08 mg/dL (H)). Liver Function Tests:  Recent Labs Lab 06/29/16 1809  AST 26  ALT 17  ALKPHOS 78  BILITOT 1.0  PROT 6.5  ALBUMIN 3.5    Recent Labs Lab 06/29/16 1809  LIPASE 20   No results for input(s): AMMONIA in the last 168 hours. Coagulation Profile:  Recent Labs Lab 06/29/16 1809  INR 1.02   Cardiac Enzymes:  Recent Labs Lab 06/29/16 1809 06/29/16 2221 06/30/16 0421 06/30/16 1043  TROPONINI 0.24* 0.21* 0.21* 0.18*   BNP (last 3 results) No results for input(s): PROBNP in the last 8760 hours. HbA1C: No results for input(s): HGBA1C in the last 72 hours. CBG: No results for input(s): GLUCAP in the last 168 hours. Lipid Profile: No results for input(s): CHOL, HDL, LDLCALC, TRIG, CHOLHDL, LDLDIRECT in the last 72 hours. Thyroid Function Tests: No results for input(s): TSH, T4TOTAL, FREET4, T3FREE, THYROIDAB in the last 72 hours. Anemia Panel: No results for input(s): VITAMINB12, FOLATE, FERRITIN, TIBC, IRON, RETICCTPCT in the last 72 hours. Sepsis Labs: No results for input(s): PROCALCITON, LATICACIDVEN in the last 168 hours.  No results found for this or any previous visit (from the past 240 hour(s)).   Radiology Studies: Ct Cervical Spine Wo Contrast  Result Date: 06/29/2016 CLINICAL DATA:   Worsening right-sided weakness. EXAM: CT CERVICAL SPINE WITHOUT CONTRAST TECHNIQUE: Multidetector CT imaging of the cervical spine was performed without intravenous contrast. Multiplanar CT image reconstructions were also generated. COMPARISON:  Cervical spine CT 06/19/2016, cervical spine MRI 05/16/2016 FINDINGS: Alignment: There is straightening of the normal cervical lordosis. No for acute static subluxation. The cervical facets are aligned. Skull base and vertebrae: There is a unchanged chronic type 2 fracture of the dens with mild posterior angulation of the superior fragment. No acute cervical spine fracture is identified. Soft tissues and spinal canal: No prevertebral fluid or swelling. No visible canal hematoma. Disc levels: Unchanged spinal canal stenosis at the C1-C2 level. There is multilevel facet and uncovertebral hypertrophy, but there is no severe bony foraminal stenosis. The worst stenosis is at the  left C3-C4 and right C4-C5 levels. There is osseous fusion at the C2-C4 levels. Upper chest: No pneumothorax, pulmonary nodule or pleural effusion. Unchanged biapical scarring Other: Normal visualized paraspinal cervical soft tissues. IMPRESSION: 1. No acute fracture or static subluxation of the cervical spine. 2. Unchanged appearance of type 2 chronic dens fracture with associated spinal canal stenosis at the C1-2 level. 3. Unchanged advanced cervical spine degenerative findings. Electronically Signed   By: Ulyses Jarred M.D.   On: 06/29/2016 22:37   Ct Abdomen Pelvis W Contrast  Result Date: 06/29/2016 CLINICAL DATA:  Abdominal distention. Patient is more lethargic normal. EXAM: CT ABDOMEN AND PELVIS WITH CONTRAST TECHNIQUE: Multidetector CT imaging of the abdomen and pelvis was performed using the standard protocol following bolus administration of intravenous contrast. CONTRAST:  56mL ISOVUE-300 IOPAMIDOL (ISOVUE-300) INJECTION 61% COMPARISON:  CT from 02/18/2014 FINDINGS: Lower chest: Bibasilar  atelectasis. No effusion or pneumothorax. Top normal size cardiac chambers. No pericardial effusion. Hepatobiliary: Colonic interposition over the liver is noted. There are least half a dozen or so well-circumscribed hypodensities scattered throughout the liver, the largest in the left hepatic lobe measuring 17 mm. These do not appear to enhance nor filled in on delayed repeat images through the upper abdomen suggesting hepatic cysts. No significant progression in size or number since prior. The gallbladder is free of stones. No wall thickening noted. No biliary dilatation. Pancreas: Atrophic pancreas without ductal dilatation. Spleen: No splenomegaly. Adrenals/Urinary Tract: Bilateral extrarenal pelves with chronic mild ectasia of the right renal collecting system. Tiny cortical cysts of both kidneys. There is mild ectasia of the right ureter to the level bladder. In the pelvis, the right distal ureter has a hyperdense appearance of (series 2 images 65 through 68 which may be due to streak artifacts from the patient's right bipolar hip arthroplasty. Alternatively intraluminal clots could have this appearance however no hyperdensities are seen within the distended bladder to suggest hemorrhage. The bladder is distended and contains a Foley catheter with balloon which may explain the air-fluid level seen within probably from instrumentation. Stomach/Bowel: Moderate colonic stool burden with gaseous distention of large bowel low without wall thickening or evidence of obstruction. Appendectomy. Vascular/Lymphatic: Aortic atherosclerosis. No enlarged abdominal or pelvic lymph nodes. Two calcified mesenteric densities just lateral to the right ureter, series 2 image 47 may represent calcified lymph nodes, measuring up to 6 mm short axis. Reproductive: Hysterectomy. Other: No ascites.  No abdominal wall hernia. Musculoskeletal: Lumbar fusion hardware noted from L3 through S1. No acute osseous abnormality IMPRESSION:  Moderate fecal retention within large bowel without obstruction. No acute bowel inflammation. Distended bladder with Foley catheter seen within likely explain the large air-fluid level noted within bladder. Stable postop lumbar fusion and right hip arthroplasty. Stable hepatic and bilateral renal cysts. Electronically Signed   By: Ashley Royalty M.D.   On: 06/29/2016 19:05   Ct Head Code Stroke W/o Cm  Result Date: 06/29/2016 CLINICAL DATA:  Code stroke.  Left-sided weakness. EXAM: CT HEAD WITHOUT CONTRAST TECHNIQUE: Contiguous axial images were obtained from the base of the skull through the vertex without intravenous contrast. COMPARISON:  None. FINDINGS: Brain: No evidence of acute infarction, hemorrhage, hydrocephalus, extra-axial collection or mass lesion/mass effect. Mild chronic microvascular ischemic changes and parenchymal volume loss. Vascular: No hyperdense vessel. Calcific atherosclerosis of cavernous internal carotid arteries per Skull: Normal. Negative for fracture or focal lesion. Sinuses/Orbits: No acute finding. Other: None. ASPECTS [facility] Stroke Program Early CT Score) - Ganglionic level infarction (caudate, lentiform nuclei,  internal capsule, insula, M1-M3 cortex): 7 - Supraganglionic infarction (M4-M6 cortex): 3 Total score (0-10 with 10 being normal): 10 IMPRESSION: 1. No acute intracranial abnormality is identified. If symptoms persist or if clinically indicated MRI is more sensitive for acute stroke. 2. ASPECTS is 10 These results were called by telephone at the time of interpretation on 06/29/2016 at 6:52 pm to [place] , who verbally acknowledged these results. Electronically Signed   By: Kristine Garbe M.D.   On: 06/29/2016 18:54    Scheduled Meds: . brimonidine  1 drop Both Eyes BID  . dorzolamide-timolol  1 drop Both Eyes BID  . latanoprost  1 drop Both Eyes QHS  . levothyroxine  125 mcg Oral QAC breakfast  .  morphine injection  2 mg Intravenous NOW  . oxyCODONE   10 mg Oral Daily  . propranolol  20 mg Oral Daily   Continuous Infusions:    LOS: 1 day   Merl Bommarito, Orpah Melter, MD [facility] Pager [phone]  If 7PM-7AM, please contact night-coverage [email or web address] Password [email or web address] 06/30/2016, 5:02 PM

## 2016-06-30 NOTE — Consult Note (Signed)
[place] Nurse wound consult note Reason for Consult: Pressure injury, deep tissue Wound type: Pressure Pressure Ulcer POA: Yes/No Measurement:4cm x 6cm area of maroon/deep purple discoloration of skin.  Non-indurated, no warmth or fluctuance. Presentation is consistent with shear force injury.  Patient's husband reports being unable to carry his wife, putting her on floor and pulling her by grasping her beneath her arms and "pulling her backwards".  Wife corroborates, indicating her asked husband to call neighbor for help. Wound bed:As described above.  No wound bed.  Drainage (amount, consistency, odor) None Periwound:Intact, dry Dressing procedure/placement/frequency: Patient will be placed on a therapeutic mattress with low air loss feature and a silicone foam dressing placed over the affected area.  She will have staff assistance as we minimize time spent in the supine position; I suspect this may be difficult as patient reports severe neck pain of unknown origin. A pressure redistribution chair pad is provided for OOB use.  Nursing is provided with guidance for care.  I suspect a higher level of care than independent living at home is required.  If you agree, please consider Case Management consult.  [place] nursing team will not follow routinely, but will remain available to this patient, the nursing and medical teams.  Please re-consult if needed and if shear injury fails to progress in a manner consistent with her overall status. Thanks, Maudie Flakes, MSN, RN, [place], Arther Abbott  Pager# [phone]

## 2016-07-01 DIAGNOSIS — M542 Cervicalgia: Secondary | ICD-10-CM

## 2016-07-01 DIAGNOSIS — Z515 Encounter for palliative care: Secondary | ICD-10-CM

## 2016-07-01 DIAGNOSIS — Z7189 Other specified counseling: Secondary | ICD-10-CM

## 2016-07-01 MED ORDER — ONDANSETRON HCL 4 MG/2ML IJ SOLN
4.0000 mg | Freq: Four times a day (QID) | INTRAMUSCULAR | Status: DC | PRN
  Filled 2016-07-01: qty 2

## 2016-07-01 MED ORDER — OXYCODONE HCL ER 10 MG PO T12A
10.0000 mg | EXTENDED_RELEASE_TABLET | Freq: Two times a day (BID) | ORAL | Status: DC
  Administered 2016-07-01 – 2016-07-02 (×2): 10 mg via ORAL
  Filled 2016-07-01 (×2): qty 1

## 2016-07-01 MED ORDER — MORPHINE SULFATE (PF) 2 MG/ML IV SOLN
2.0000 mg | INTRAVENOUS | Status: DC | PRN
  Administered 2016-07-01: 2 mg via INTRAVENOUS
  Filled 2016-07-01: qty 1

## 2016-07-01 MED ORDER — OXYCODONE HCL 5 MG PO TABS
5.0000 mg | ORAL_TABLET | ORAL | Status: DC | PRN
  Administered 2016-07-01: 5 mg via ORAL
  Filled 2016-07-01: qty 1

## 2016-07-01 MED ORDER — POLYETHYLENE GLYCOL 3350 17 G PO PACK
17.0000 g | PACK | Freq: Every day | ORAL | Status: DC
  Administered 2016-07-01 – 2016-07-02 (×2): 17 g via ORAL
  Filled 2016-07-01 (×2): qty 1

## 2016-07-01 NOTE — Treatment Plan (Signed)
Patient seen and examined. Full note will follow. Briefly, met with and discussed dispo plans with family and patient. Patient initially very adamant about going home. After discussion, patient later agreed to short term snf for rehab. Will consult SW

## 2016-07-01 NOTE — Clinical Social Work Note (Signed)
Clinical Social Work Assessment  Patient Details  Name: [name] MRN: [id number] Date of Birth: May 11, 1925  Date of referral:  07/01/16               Reason for consult:  Facility Placement                Permission sought to share information with:  Family Supports Permission granted to share information::  Yes, Verbal Permission Granted  Name::     Engineer, maintenance::  SNF  Relationship::  spouse  Contact Information:     Housing/Transportation Living arrangements for the past 2 months:  [place] of Information:  Patient, Medical Team Patient Interpreter Needed:  None Criminal Activity/Legal Involvement Pertinent to Current Situation/Hospitalization:  No - Comment as needed Significant Relationships:  Spouse Lives with:  Spouse Do you feel safe going back to the place where you live?  No Need for family participation in patient care:  Yes (Comment) (husband has been assisting at home)  Care giving concerns:  Pt lives at home with elderly husband- pt has been basically bed bound past few days and spouse has been dragging her around- does not have sufficient help at home given current mobility.   Social Worker assessment / plan:  CSW informed by MD that pt now agreeable to SNF- CSW met with pt to confirm and provide info about SNF and SNF referral process.    Employment status:  Retired Forensic scientist:  Medicare PT Recommendations:  [place] / Referral to community resources:  [place]  Patient/Family's Response to care:  Pt is now agreeable to SNF stay after long conversation with the MD but not sure which facility she would be interested in- has never been to SNF before.  Patient/Family's Understanding of and Emotional Response to Diagnosis, Current Treatment, and Prognosis:  Unclear level of understanding but pt expresses no concerns at this time- hopeful she will not have to stay at rehab  long.  Emotional Assessment Appearance:  Appears stated age Attitude/Demeanor/Rapport:    Affect (typically observed):  Appropriate Orientation:  Oriented to Situation, Oriented to  Time, Oriented to Place, Oriented to Self Alcohol / Substance use:  Not Applicable Psych involvement (Current and /or in the community):  No (Comment)  Discharge Needs  Concerns to be addressed:  Care Coordination, Home Safety Concerns Readmission within the last 30 days:  Yes Current discharge risk:  Physical Impairment Barriers to Discharge:  Continued Medical Work up   Jorge Ny, LCSW 07/01/2016, 4:09 PM

## 2016-07-01 NOTE — Consult Note (Signed)
Consultation Note Date: 07/01/2016   Patient Name: [name]  DOB: 1925/05/23  MRN: [id number]  Age / Sex: 82 y.o., female  PCP: Merrilee Seashore, MD Referring Physician: Donne Hazel, MD  Reason for Consultation: Establishing goals of care and Pain control  HPI/Patient Profile: 82 y.o. female  with past medical history of significant cervical stenosis (not a surgical candidate), decub ulcer who was admitted last week and recommended to go to a rehab/SNF (refused this)  admitted on 06/29/2016 with right sided weakness and poor intake.   Clinical Assessment and Goals of Care: I met today with [name] and her husband, [name].  She reports that the most important things to her are her family and being in her own home. She reports herself as a "homebody" and states that home is the only place that she ever feels comfortable.  She reports that her doctors have been doing a good job explaining things to her. We reviewed her clinical course to this point in time as well as the fact that she has multiple comorbid conditions that are not going to improve. Both she and her husband minimize the severity of her condition and amount of care that she needs at home. At the same time, she is able to express why people were concerned about her decision to go home and states that these are risks that she accepts. She states that they will be able to have more friends and family come and assist her if necessary.  We talked about her pain and I agree with plan for IV morphine as needed and will continue to assess long-term pain regimen based upon her usage overnight.  With her permission, I called and spoke with her daughter, [name].  [name] reports that [name] is her mother's second husband, and her mother has told her that he is on medication for dementia. [name] reports that [name] is very loving and will do anything he  can to help her mother, but, like her care team, she is also concerned about Robert's ability to physically handle her mother's care needs at their home.  [name] reports that it has been a "battle" when it comes to conversations regarding her mother's care and home situation. She states that her mother is very stubborn and is going to do what she wants to do when it comes to her care. She was very open to idea of home health or hospice depending upon which will be able to provide the most services for her mother at home. She states understanding that her mother has a limited amount of time and her hope is that she will continue to be able to feel as well as possible as she progresses in her disease.  SUMMARY OF RECOMMENDATIONS   - DNR/DNI - Today, she is adamant about going home.  After talking with her and her husband, I also called her daughter (with her permission). Her daughter reports that she remains concerned about her mother being at home and Robert's ability to care  for her. This is something that she said has been a "battle" for a period of time. She stated that her mother is "going to do what she wants to do" and she understands the concern her care team has with plan to discharge home.  - After hearing more about the home situation, she may be better served with hospice support rather than home health. I will discuss this with patient and her husband tomorrow to try to determine which will provide the most resources.  - I will also recommend again that they consider placement at skilled facility rather than discharging home as this would be safest option. I do not think that they will change their mind about going home regardless of the risks involved. She was able to verbalize back to me concerns about her going home and stated that she accepts these risks and wants to do so even if it is against the recommendations of care team.  Code Status/Advance Care Planning:  DNR   Symptom  Management:   Pain: Talked with her at length about her pain. She reports that she continues to have chronic pain at home. It was better after receiving IV morphine here in the hospital. She then had wound care come and assess her and she is now hurting after exam. I put in for one-time extra dose of morphine to be given now to get her pain back under control. We'll continue to monitor overall usage and adjust regimen as appropriate.  Palliative Prophylaxis:   Aspiration, Bowel Regimen, Delirium Protocol and Frequent Pain Assessment  Psycho-social/Spiritual:   Desire for further Chaplaincy support:no  Additional Recommendations: Education on Hospice  Prognosis:   < 6 months  Discharge Planning: To Be Determined; patient adamant about going home      Primary Diagnoses: Present on Admission: . Pressure injury of skin . Essential hypertension . Spinal stenosis in cervical region . AKI (acute kidney injury) ([place]) . Dehydration . Constipation . Hyponatremia . Elevated troponin . Hyperkalemia . Quadriparesis [facility])   I have reviewed the medical record, interviewed the patient and family, and examined the patient. The following aspects are pertinent.  Past Medical History:  Diagnosis Date  . Anemia   . Angina   . Arthritis   . Blood transfusion   . Chronic back pain   . Exertional dyspnea 01/17/12  . Fracture 2008   back  . GERD (gastroesophageal reflux disease)   . Glaucoma   . H/O hiatal hernia   . Headache(784.0)   . Hypertension   . Hypoglycemia   . Hypothyroidism   . Macular degeneration of both eyes   . Metabolic acidemia 12/28/621  . Osteomyelitis ([place]) 11/2011   right knee  . Pneumonia 01/2010  . Shortness of breath 11/08/11   "here lately, all the time"  . Thyroid cancer ([place]) 1986   Social History   Social History  . Marital status: Married    Spouse name: N/A  . Number of children: N/A  . Years of education: N/A   Social History Main Topics  .  Smoking status: Never Smoker  . Smokeless tobacco: Never Used  . Alcohol use No  . Drug use: No  . Sexual activity: Not Currently   Other Topics Concern  . None   Social History Narrative  . None   Family History  Problem Relation Age of Onset  . Intracerebral hemorrhage Mother   . Stroke Father   . Parkinson's disease Brother    Scheduled  Meds: . brimonidine  1 drop Both Eyes BID  . dorzolamide-timolol  1 drop Both Eyes BID  . latanoprost  1 drop Both Eyes QHS  . levothyroxine  125 mcg Oral QAC breakfast  . oxyCODONE  10 mg Oral Daily  . propranolol  20 mg Oral Daily   Continuous Infusions:  PRN Meds:.ketorolac, morphine injection, oxyCODONE-acetaminophen Medications Prior to Admission:  Prior to Admission medications   Medication Sig Start Date End Date Taking? Authorizing Provider  acetaminophen (TYLENOL) 500 MG tablet Take 500 mg by mouth every 4 (four) hours as needed for moderate pain.   Yes Historical Provider, MD  brimonidine (ALPHAGAN) 0.2 % ophthalmic solution Place 1 drop into both eyes 2 (two) times daily. 01/12/16  Yes Historical Provider, MD  dorzolamide-timolol (COSOPT) 22.3-6.8 MG/ML ophthalmic solution Place 1 drop into both eyes 2 (two) times daily. 01/12/16  Yes Historical Provider, MD  gabapentin (NEURONTIN) 300 MG capsule Take 300 mg by mouth 3 (three) times daily.   Yes Historical Provider, MD  ibuprofen (ADVIL,MOTRIN) 200 MG tablet Take 200 mg by mouth every 4 (four) hours as needed for moderate pain.   Yes Historical Provider, MD  latanoprost (XALATAN) 0.005 % ophthalmic solution Place 1 drop into both eyes 2 (two) times daily.  01/05/13  Yes Historical Provider, MD  levothyroxine (SYNTHROID, LEVOTHROID) 125 MCG tablet Take 1 tablet (125 mcg total) by mouth daily before breakfast. 06/24/16  Yes Wallis Bamberg, MD  oxyCODONE (OXYCONTIN) 10 mg 12 hr tablet Take 1 tablet (10 mg total) by mouth daily. Patient taking differently: Take 10 mg by mouth every 4  (four) hours.  06/23/16  Yes Wallis Bamberg, MD  propranolol (INDERAL) 20 MG tablet Take 20 mg by mouth daily. Takes for migraines.   Yes Historical Provider, MD  spironolactone (ALDACTONE) 25 MG tablet Take 25 mg by mouth 2 (two) times daily. 04/30/16  Yes Historical Provider, MD  traZODone (DESYREL) 50 MG tablet Take 50 mg by mouth at bedtime.   Yes Historical Provider, MD   Allergies  Allergen Reactions  . Demerol Shortness Of Breath  . Penicillins Swelling    "in the face" Has patient had a PCN reaction causing immediate rash, facial/tongue/throat swelling, SOB or lightheadedness with hypotension: yes Has patient had a PCN reaction causing severe rash involving mucus membranes or skin necrosis: no Has patient had a PCN reaction that required hospitalization: no  Has patient had a PCN reaction occurring within the last 10 years: no If all of the above answers are "NO", then may proceed with Cephalosporin use.   . Corticosteroids Other (See Comments)    DIZZINESS, HEADACHE  . Lorazepam Anxiety  . Tetracyclines & Related Rash  . Vancomycin Rash   Review of Systems  Constitutional: Positive for activity change, fatigue and unexpected weight change.  Gastrointestinal: Positive for constipation.  Musculoskeletal: Positive for back pain, gait problem and neck pain.  Neurological: Positive for weakness and light-headedness.  Psychiatric/Behavioral: Positive for sleep disturbance.  All other systems reviewed and are negative.   Physical Exam General exam: Appears calm and comfortable  Respiratory system: Clear to auscultation. Respiratory effort normal. Cardiovascular system: S1 & S2 heard, RRR. Gastrointestinal system: Abdomen is nondistended, soft and nontender. No organomegaly or masses felt. Normal bowel sounds heard. Nervous system: Alert and oriented.Right side weakness in upper and lower extremity Skin: No rashes, lesions or ulcers Psychiatry: Judgement and insight appear  normal. Mood & affect appropriate.    Vital Signs: BP Marland Kitchen)  129/45 (BP Location: Right Arm)   Pulse 71   Temp 97.9 F (36.6 C) (Oral)   Resp 20   Ht 5' 1.5" (1.562 m)   Wt 61.8 kg (136 lb 3.9 oz)   SpO2 94%   BMI 25.33 kg/m  Pain Assessment: 0-10   Pain Score: 7    SpO2: SpO2: 94 % O2 Device:SpO2: 94 % O2 Flow Rate: .   IO: Intake/output summary:  Intake/Output Summary (Last 24 hours) at 07/01/16 0911 Last data filed at 06/30/16 1900  Gross per 24 hour  Intake              240 ml  Output              700 ml  Net             -460 ml    LBM: Last BM Date: 06/22/16 Baseline Weight: Weight: 61.8 kg (136 lb 3.9 oz) Most recent weight: Weight: 61.8 kg (136 lb 3.9 oz)     Palliative Assessment/Data:   Flowsheet Rows   Flowsheet Row Most Recent Value  Intake Tab  Referral Department  Hospitalist  Unit at Time of Referral  Med/Surg Unit  Palliative Care Primary Diagnosis  Trauma  Date Notified  06/29/16  Palliative Care Type  New Palliative care  Reason for referral  Pain, Clarify Goals of Care, Counsel Regarding Hospice  Date of Admission  06/29/16  Date first seen by Palliative Care  06/30/16  # of days Palliative referral response time  1 Day(s)  # of days IP prior to Palliative referral  0  Clinical Assessment  Palliative Performance Scale Score  40%  Pain Max last 24 hours  9  Pain Min Last 24 hours  3  Psychosocial & Spiritual Assessment  Palliative Care Outcomes  Patient/Family meeting held?  Yes  Who was at the meeting?  Patient, husband, daughter via phone  Palliative Care Outcomes  Improved pain interventions, Clarified goals of care      Time In: 1535 Time Out: 1655 Time Total: 80 Greater than 50%  of this time was spent counseling and coordinating care related to the above assessment and plan.  Signed by: Micheline Rough, MD   Please contact Palliative Medicine Team phone at [phone] for questions and concerns.  For individual provider: See  Shea Evans

## 2016-07-01 NOTE — NC FL2 (Signed)
[place] LEVEL OF CARE SCREENING TOOL     IDENTIFICATION  Patient Name: [name] Birthdate: 08-30-1925 Sex: female Admission Date (Current Location): 06/29/2016  [facility] and [place] Number:  Herbalist and Address:  [facility], [place] [address], [place], [place] [address]      Provider Number: [id number]  Attending Physician Name and Address:  Donne Hazel, MD  Relative Name and Phone Number:       Current Level of Care: Hospital Recommended Level of Care: [place] Prior Approval Number:    Date Approved/Denied:   PASRR Number: [id number]  Discharge Plan: SNF    Current Diagnoses: Patient Active Problem List   Diagnosis Date Noted  . AKI (acute kidney injury) ([place]) 06/29/2016  . Dehydration 06/29/2016  . Constipation 06/29/2016  . Hyponatremia 06/29/2016  . Elevated troponin 06/29/2016  . Weakness of right side of body 06/29/2016  . Hyperkalemia 06/29/2016  . Quadriparesis ([place]) 06/29/2016  . Pressure injury of skin 06/21/2016  . Acute cystitis without hematuria   . Spinal stenosis in cervical region 06/20/2016  . Upper extremity weakness 06/20/2016  . UTI (urinary tract infection) 06/20/2016  . Spinal stenosis 06/20/2016  . Bilateral leg weakness 06/20/2016  . Delirium   . Essential hypertension   . Postoperative anemia due to acute blood loss 01/26/2016  . GERD (gastroesophageal reflux disease) 01/21/2016  . Fracture of femoral neck, right, closed ([place]) 01/21/2016  . Closed displaced fracture of right femoral neck ([place]) 01/21/2016  . Closed right hip fracture ([place]) 01/21/2016  . Fall   . Odontoid fracture ([place])   . Protein-calorie malnutrition, moderate ([place]) 02/19/2014  . Severe diarrhea 02/18/2014  . Metabolic acidosis 99991111  . Weight loss, unintentional 02/18/2014  . Rash of neck 02/18/2014  . Hypothyroidism 02/18/2014  . Chronic anticoagulation 02/18/2014  . Tonsillar  exudate 02/18/2014  . Lumbar radiculopathy 12/28/2013  . Cancer ([place]) 12/28/2013  . DDD (degenerative disc disease), lumbosacral 12/28/2013  . Glaucoma 12/28/2013  . Herpes zona 12/28/2013  . Chronic ulcer of skin ([place]) 12/28/2013  . Arthropathia 11/02/2013  . Failed back syndrome of lumbar spine 11/02/2013  . Anemia of unknown etiology 01/21/2012  . Hypokalemia 01/19/2012  . Anemia due to blood loss, acute 01/19/2012  . Infection of prosthetic knee joint ([place]) 01/18/2012  . Drug rash 12/14/2011  . Prosthetic joint infection ([place]) 12/14/2011  . GBS (group B streptococcus) infection 12/14/2011  . Hx of thyroid cancer 12/14/2011  . Bilateral lower extremity edema 12/14/2011  . Hypertension 01/05/2011    Orientation RESPIRATION BLADDER Height & Weight     Self, Time, Situation, Place    Incontinent, Indwelling catheter Weight: 136 lb 3.9 oz (61.8 kg) Height:  5' 1.5" (156.2 cm)  BEHAVIORAL SYMPTOMS/MOOD NEUROLOGICAL BOWEL NUTRITION STATUS      Continent Diet (cardiac)  AMBULATORY STATUS COMMUNICATION OF NEEDS Skin   Extensive Assist Verbally PU Stage and Appropriate Care   PU Stage 2 Dressing:  (sacrum)                   Personal Care Assistance Level of Assistance  Bathing, Dressing Bathing Assistance: Limited assistance   Dressing Assistance: Limited assistance     Functional Limitations Info             SPECIAL CARE FACTORS FREQUENCY  PT (By licensed PT), OT (By licensed OT)     PT Frequency: 5/wk OT Frequency: 5/wk  Contractures      Additional Factors Info  Code Status, Allergies Code Status Info: DNR Allergies Info: Demerol, Penicillins, Corticosteroids, Lorazepam, Tetracyclines & Related, Vancomycin           Current Medications (07/01/2016):  This is the current hospital active medication list Current Facility-Administered Medications  Medication Dose Route Frequency Provider Last Rate Last Dose  . brimonidine (ALPHAGAN) 0.2 %  ophthalmic solution 1 drop  1 drop Both Eyes BID Phillips Grout, MD   1 drop at 07/01/16 0839  . dorzolamide-timolol (COSOPT) 22.3-6.8 MG/ML ophthalmic solution 1 drop  1 drop Both Eyes BID Phillips Grout, MD   1 drop at 07/01/16 0839  . ketorolac (TORADOL) 15 MG/ML injection 15 mg  15 mg Intravenous Q6H PRN Donne Hazel, MD   15 mg at 07/01/16 0202  . latanoprost (XALATAN) 0.005 % ophthalmic solution 1 drop  1 drop Both Eyes QHS Phillips Grout, MD   1 drop at 06/30/16 2204  . levothyroxine (SYNTHROID, LEVOTHROID) tablet 125 mcg  125 mcg Oral QAC breakfast Phillips Grout, MD   125 mcg at 07/01/16 0839  . morphine 2 MG/ML injection 2 mg  2 mg Intravenous Q4H PRN Micheline Rough, MD   2 mg at 07/01/16 1304  . ondansetron (ZOFRAN) injection 4 mg  4 mg Intravenous Q6H PRN Donne Hazel, MD      . oxyCODONE (Oxy IR/ROXICODONE) immediate release tablet 5 mg  5 mg Oral Q4H PRN Gene Domingo Cocking, MD      . oxyCODONE (OXYCONTIN) 12 hr tablet 10 mg  10 mg Oral Q12H Gene Freeman, MD      . polyethylene glycol (MIRALAX / GLYCOLAX) packet 17 g  17 g Oral Daily Donne Hazel, MD      . propranolol (INDERAL) tablet 20 mg  20 mg Oral Daily Phillips Grout, MD   20 mg at 07/01/16 [id number]     Discharge Medications: Please see discharge summary for a list of discharge medications.  Relevant Imaging Results:  Relevant Lab Results:   Additional Information SSN:  [id number]  Jorge Ny, LCSW

## 2016-07-01 NOTE — Progress Notes (Signed)
Daily Progress Note   Patient Name: [name]       Date: 07/01/2016 DOB: 1924/11/01  Age: 82 y.o. MRN#: [id number] Attending Physician: Donne Hazel, MD Primary Care Physician: Merrilee Seashore, MD Admit Date: 06/29/2016  Reason for Consultation/Follow-up: Establishing goals of care and Pain control  Subjective: Met with patient, daughter, and husband.  We discussed plan moving forward.  See below.  Length of Stay: 2  Current Medications: Scheduled Meds:  . brimonidine  1 drop Both Eyes BID  . dorzolamide-timolol  1 drop Both Eyes BID  . latanoprost  1 drop Both Eyes QHS  . levothyroxine  125 mcg Oral QAC breakfast  . oxyCODONE  10 mg Oral Q12H  . polyethylene glycol  17 g Oral Daily  . propranolol  20 mg Oral Daily    Continuous Infusions:    PRN Meds: ketorolac, morphine injection, ondansetron (ZOFRAN) IV, oxyCODONE  Physical Exam   General exam:Appears calm and comfortable  Respiratory system: Clear to auscultation. Respiratory effort normal. Cardiovascular system:S1 &S2 heard, RRR. Gastrointestinal system:Abdomen is nondistended, soft and nontender. No organomegaly or masses felt. Normal bowel sounds heard. Nervous system:Alert and oriented.Right side weakness in upper and lower extremity Skin: No rashes, lesions or ulcers Psychiatry:Judgement and insight appear normal. Mood &affect appropriate.         Vital Signs: BP 133/61 (BP Location: Left Wrist)   Pulse 74   Temp 98.2 F (36.8 C) (Oral)   Resp 20   Ht 5' 1.5" (1.562 m)   Wt 61.8 kg (136 lb 3.9 oz)   SpO2 96%   BMI 25.33 kg/m  SpO2: SpO2: 96 % O2 Device: O2 Device: Not Delivered O2 Flow Rate:    Intake/output summary:  Intake/Output Summary (Last 24 hours) at 07/01/16 2136 Last data  filed at 07/01/16 1300  Gross per 24 hour  Intake              480 ml  Output              700 ml  Net             -220 ml   LBM: Last BM Date: 06/22/16 Baseline Weight: Weight: 61.8 kg (136 lb 3.9 oz) Most recent weight: Weight: 61.8 kg (136 lb 3.9 oz)  Palliative Assessment/Data:    Flowsheet Rows   Flowsheet Row Most Recent Value  Intake Tab  Referral Department  Hospitalist  Unit at Time of Referral  Med/Surg Unit  Palliative Care Primary Diagnosis  Trauma  Date Notified  06/29/16  Palliative Care Type  New Palliative care  Reason for referral  Pain, Clarify Goals of Care, Counsel Regarding Hospice  Date of Admission  06/29/16  Date first seen by Palliative Care  06/30/16  # of days Palliative referral response time  1 Day(s)  # of days IP prior to Palliative referral  0  Clinical Assessment  Palliative Performance Scale Score  40%  Pain Max last 24 hours  9  Pain Min Last 24 hours  3  Psychosocial & Spiritual Assessment  Palliative Care Outcomes  Patient/Family meeting held?  Yes  Who was at the meeting?  Patient, husband, daughter via phone  Palliative Care Outcomes  Improved pain interventions, Clarified goals of care      Patient Active Problem List   Diagnosis Date Noted  . AKI (acute kidney injury) ([place]) 06/29/2016  . Dehydration 06/29/2016  . Constipation 06/29/2016  . Hyponatremia 06/29/2016  . Elevated troponin 06/29/2016  . Weakness of right side of body 06/29/2016  . Hyperkalemia 06/29/2016  . Quadriparesis ([place]) 06/29/2016  . Pressure injury of skin 06/21/2016  . Acute cystitis without hematuria   . Spinal stenosis in cervical region 06/20/2016  . Upper extremity weakness 06/20/2016  . UTI (urinary tract infection) 06/20/2016  . Spinal stenosis 06/20/2016  . Bilateral leg weakness 06/20/2016  . Delirium   . Essential hypertension   . Postoperative anemia due to acute blood loss 01/26/2016  . GERD (gastroesophageal reflux disease)  01/21/2016  . Fracture of femoral neck, right, closed ([place]) 01/21/2016  . Closed displaced fracture of right femoral neck ([place]) 01/21/2016  . Closed right hip fracture ([place]) 01/21/2016  . Fall   . Odontoid fracture ([place])   . Protein-calorie malnutrition, moderate ([place]) 02/19/2014  . Severe diarrhea 02/18/2014  . Metabolic acidosis 51/10/5850  . Weight loss, unintentional 02/18/2014  . Rash of neck 02/18/2014  . Hypothyroidism 02/18/2014  . Chronic anticoagulation 02/18/2014  . Tonsillar exudate 02/18/2014  . Lumbar radiculopathy 12/28/2013  . Cancer ([place]) 12/28/2013  . DDD (degenerative disc disease), lumbosacral 12/28/2013  . Glaucoma 12/28/2013  . Herpes zona 12/28/2013  . Chronic ulcer of skin ([place]) 12/28/2013  . Arthropathia 11/02/2013  . Failed back syndrome of lumbar spine 11/02/2013  . Anemia of unknown etiology 01/21/2012  . Hypokalemia 01/19/2012  . Anemia due to blood loss, acute 01/19/2012  . Infection of prosthetic knee joint ([place]) 01/18/2012  . Drug rash 12/14/2011  . Prosthetic joint infection ([place]) 12/14/2011  . GBS (group B streptococcus) infection 12/14/2011  . Hx of thyroid cancer 12/14/2011  . Bilateral lower extremity edema 12/14/2011  . Hypertension 01/05/2011    Palliative Care Assessment & Plan   Patient Profile: 82 y.o. female  with past medical history of significant cervical stenosis (not a surgical candidate), decub ulcer who was admitted last week and recommended to go to a rehab/SNF (refused this)  admitted on 06/29/2016 with right sided weakness and poor intake.   Recommendations/Plan:  To SNF for trial of rehab We discussed plan for SNF for rehab.  Her overall goal remains to be at home.  We talked at length about pathways forward once she gets to skilled facility, including need for long term care facility (she will refuse this), home  with home health, and home with hospice support.  We discussed that the hospital can be useful as long as she  is getting well enough from care she receives at the hospital to enjoy time at home, but there is going to come a time in the near future where, if her goal is to be at home, she may be better served to plan on being at home and bringing care to her at home rather repeated trips to the hospital. We discussed hospice as a tool that may be beneficial in this goal when she reaches a point where we are trying to fix problems that are not fixable.  She is in agreement that a good plan would be to plan go to rehab. If she does well and continues to thrive, I encouraged they continue with this plan. If, however she is unable to regain function and she continues to decline, I recommended that she speak with her PCP to determine if she may be better served by focusing his care on staying at home with support of organization such as hospice.  Pain: She has been taking oxycontin once daily.  Increased this to BID.  Continue oxycodone 68m as breakthrough.    Goals of Care and Additional Recommendations:  Limitations on Scope of Treatment: Avoid Hospitalization if possible  Code Status:    Code Status Orders        Start     Ordered   06/30/16 1648  Do not attempt resuscitation (DNR)  Continuous    Question Answer Comment  In the event of cardiac or respiratory ARREST Do not call a "code blue"   In the event of cardiac or respiratory ARREST Do not perform Intubation, CPR, defibrillation or ACLS   In the event of cardiac or respiratory ARREST Use medication by any route, position, wound care, and other measures to relive pain and suffering. May use oxygen, suction and manual treatment of airway obstruction as needed for comfort.      06/30/16 1648    Code Status History    Date Active Date Inactive Code Status Order ID Comments User Context   06/29/2016 10:04 PM 06/30/2016  4:48 PM Partial Code [id number] RPhillips Grout MD ED   06/20/2016 11:03 AM 06/23/2016  7:16 PM Partial Code [id number] KRadene Gunning NP ED   06/20/2016 11:03 AM 06/20/2016 11:03 AM Full Code [id number] KRadene Gunning NP ED   01/21/2016  9:10 PM 01/26/2016 10:37 PM DNR [id number] XIvor Costa MD ED   02/18/2014  4:40 PM 02/20/2014  5:05 PM DNR [id number] MMelton Alar PA-C Inpatient   02/18/2014  4:01 PM 02/18/2014  4:40 PM DNR [id number] MMelton Alar PA-C ED    Advance Directive Documentation   Flowsheet Row Most Recent Value  Type of Advance Directive  Healthcare Power of Attorney, Living will  Pre-existing out of facility DNR order (yellow form or pink MOST form)  No data  "MOST" Form in Place?  No data       Prognosis:   < 6 months  [place]  [place]  Care plan was discussed with patient, husband, daughter  Thank you for allowing the Palliative Medicine Team to assist in the care of this patient.   Time In: 1105 Time Out: 1155 Total Time 50 Prolonged Time Billed  No       Greater than 50%  of  this time was spent counseling and coordinating care related to the above assessment and plan.  Micheline Rough, MD  Please contact Palliative Medicine Team phone at [phone] for questions and concerns.

## 2016-07-01 NOTE — Plan of Care (Signed)
Problem: Health Behavior/Discharge Planning: Goal: Ability to manage health-related needs will improve Outcome: Progressing Pt now agreeable to SNF at discharge.

## 2016-07-01 NOTE — Progress Notes (Signed)
PROGRESS NOTE    [name]  [id number] DOB: 13-Jul-1925 DOA: 06/29/2016 PCP: Merrilee Seashore, MD    Brief Narrative:  82 y.o. female with medical history significant of significant cervical stenosis not a surgical candidate, decub ulcer was just d/c last week to home after recommended going to a rehab/SNF but she declined this offer comes back in for worsening right sided weakness and not eating very well.  She denies fevers.  She says she has not been able to get out of bed in 4 days as she cannot move her right side.  She is able to talk normally, and move her face normally.  She has been in a c- collar per neurosurgery recommendations at her discharge last week.  She only has her husband to take care of her at home.  She is still not wanted to go to a SNF.  Repeat imaging of her neck is pending however dr Trenton Gammon with neurosurgery at [facility] was called by EDP and recommended no surgical intervention irregardless of her imaging, that surgery would still not be considered due to her high risk.  He recommended for her to stay at [facility] and get palliative care involved.  Pt found to be dehydrated, with AKI and worsening neurological symptoms in the setting of cpine stenosis.  Assessment & Plan:   Principal Problem:   Weakness of right side of body Active Problems:   Spinal stenosis in cervical region   Essential hypertension   Pressure injury of skin   AKI (acute kidney injury) ([facility])   Dehydration   Constipation   Hyponatremia   Elevated troponin   Hyperkalemia   Quadriparesis ([place])   1. Right-sided weakness secondary to cervical spinal stenosis 1. Patient with severe cervical stenosis that is not operable, resulting in right-sided weakness 2. At time of admission, case was discussed with neurosurgeon on call who states the patient is considered too high risk for surgery, thus recommendations for palliative measures instead 3. Appreciate input by palliative  care 4. Patient previously adamant about going home. After discussion with patient and family, patient ultimately agreeable to skilled nursing facility. Have discussed with social work. We'll consult physical therapy 2. Hypertension 1. Blood pressure currently stable 2. Continue regimen for now 3. Scheer force injury 1. Recommended by wound ostomy nurse noted 2. Appear stable at this time 4. Acute renal failure 1. Patient is continued on IV fluids 5. Dehydration 1. Currently improved with IV fluids 6. Hyponatremia 1. Likely secondary to dehydration 2. Sodium has improved with IV fluid hydration  DVT prophylaxis: SCDs Code Status: DO NOT RESUSCITATE Family Communication: Patient in room, family at bedside Disposition Plan: Uncertain at this time  Consultants:   Palliative care  Case discussed with neurosurgeon at time of admission  Procedures:     Antimicrobials: Anti-infectives    None      Subjective: No complaints this morning  Objective: Vitals:   06/30/16 1500 06/30/16 2155 07/01/16 0650 07/01/16 1443  BP: (!) 110/42 (!) 129/45  (!) 108/40  Pulse: 71 71 71 86  Resp: 18 16 20 16   Temp: 97.9 F (36.6 C) 97.5 F (36.4 C) 97.9 F (36.6 C) 98.1 F (36.7 C)  TempSrc: Oral Oral Oral Oral  SpO2: 95% 94%  96%  Weight:      Height:        Intake/Output Summary (Last 24 hours) at 07/01/16 1645 Last data filed at 07/01/16 1300  Gross per 24 hour  Intake  480 ml  Output             1400 ml  Net             -920 ml   Filed Weights   06/29/16 2320  Weight: 61.8 kg (136 lb 3.9 oz)    Examination:  General exam: Awake, lying in bed, conversant Respiratory system: Normal respiratory effort, no audible wheezing Cardiovascular system: Regular rate, S1-S2 Gastrointestinal system: Decreased bowel sounds, nontender Central nervous system: CN II through XII grossly intact, right upper extremity weakness, right lower extremity week Extremities:  Perfused, no clubbing Skin: Normal skin turgor, no rashes noted Psychiatry: Normal mood, no visual hallucinations, insight intact  Data Reviewed: I have personally reviewed following labs and imaging studies  CBC:  Recent Labs Lab 06/29/16 1809 06/30/16 0421  WBC 19.3* 15.5*  NEUTROABS 16.7*  --   HGB 12.7 9.9*  HCT 37.6 29.3*  MCV 92.4 92.7  PLT 385 [id number]   Basic Metabolic Panel:  Recent Labs Lab 06/29/16 1809 06/30/16 0421  NA 123* 128*  K 5.6* 4.1  CL 88* 99*  CO2 23 21*  GLUCOSE 121* 155*  BUN 129* 98*  CREATININE 1.71* 1.08*  CALCIUM 8.6* 7.7*   GFR: Estimated Creatinine Clearance: 30.8 mL/min (by C-G formula based on SCr of 1.08 mg/dL (H)). Liver Function Tests:  Recent Labs Lab 06/29/16 1809  AST 26  ALT 17  ALKPHOS 78  BILITOT 1.0  PROT 6.5  ALBUMIN 3.5    Recent Labs Lab 06/29/16 1809  LIPASE 20   No results for input(s): AMMONIA in the last 168 hours. Coagulation Profile:  Recent Labs Lab 06/29/16 1809  INR 1.02   Cardiac Enzymes:  Recent Labs Lab 06/29/16 1809 06/29/16 2221 06/30/16 0421 06/30/16 1043  TROPONINI 0.24* 0.21* 0.21* 0.18*   BNP (last 3 results) No results for input(s): PROBNP in the last 8760 hours. HbA1C: No results for input(s): HGBA1C in the last 72 hours. CBG: No results for input(s): GLUCAP in the last 168 hours. Lipid Profile: No results for input(s): CHOL, HDL, LDLCALC, TRIG, CHOLHDL, LDLDIRECT in the last 72 hours. Thyroid Function Tests: No results for input(s): TSH, T4TOTAL, FREET4, T3FREE, THYROIDAB in the last 72 hours. Anemia Panel: No results for input(s): VITAMINB12, FOLATE, FERRITIN, TIBC, IRON, RETICCTPCT in the last 72 hours. Sepsis Labs: No results for input(s): PROCALCITON, LATICACIDVEN in the last 168 hours.  No results found for this or any previous visit (from the past 240 hour(s)).   Radiology Studies: Ct Cervical Spine Wo Contrast  Result Date: 06/29/2016 CLINICAL DATA:   Worsening right-sided weakness. EXAM: CT CERVICAL SPINE WITHOUT CONTRAST TECHNIQUE: Multidetector CT imaging of the cervical spine was performed without intravenous contrast. Multiplanar CT image reconstructions were also generated. COMPARISON:  Cervical spine CT 06/19/2016, cervical spine MRI 05/16/2016 FINDINGS: Alignment: There is straightening of the normal cervical lordosis. No for acute static subluxation. The cervical facets are aligned. Skull base and vertebrae: There is a unchanged chronic type 2 fracture of the dens with mild posterior angulation of the superior fragment. No acute cervical spine fracture is identified. Soft tissues and spinal canal: No prevertebral fluid or swelling. No visible canal hematoma. Disc levels: Unchanged spinal canal stenosis at the C1-C2 level. There is multilevel facet and uncovertebral hypertrophy, but there is no severe bony foraminal stenosis. The worst stenosis is at the left C3-C4 and right C4-C5 levels. There is osseous fusion at the C2-C4 levels. Upper chest: No pneumothorax, pulmonary nodule  or pleural effusion. Unchanged biapical scarring Other: Normal visualized paraspinal cervical soft tissues. IMPRESSION: 1. No acute fracture or static subluxation of the cervical spine. 2. Unchanged appearance of type 2 chronic dens fracture with associated spinal canal stenosis at the C1-2 level. 3. Unchanged advanced cervical spine degenerative findings. Electronically Signed   By: Ulyses Jarred M.D.   On: 06/29/2016 22:37   Ct Abdomen Pelvis W Contrast  Result Date: 06/29/2016 CLINICAL DATA:  Abdominal distention. Patient is more lethargic normal. EXAM: CT ABDOMEN AND PELVIS WITH CONTRAST TECHNIQUE: Multidetector CT imaging of the abdomen and pelvis was performed using the standard protocol following bolus administration of intravenous contrast. CONTRAST:  21mL ISOVUE-300 IOPAMIDOL (ISOVUE-300) INJECTION 61% COMPARISON:  CT from 02/18/2014 FINDINGS: Lower chest: Bibasilar  atelectasis. No effusion or pneumothorax. Top normal size cardiac chambers. No pericardial effusion. Hepatobiliary: Colonic interposition over the liver is noted. There are least half a dozen or so well-circumscribed hypodensities scattered throughout the liver, the largest in the left hepatic lobe measuring 17 mm. These do not appear to enhance nor filled in on delayed repeat images through the upper abdomen suggesting hepatic cysts. No significant progression in size or number since prior. The gallbladder is free of stones. No wall thickening noted. No biliary dilatation. Pancreas: Atrophic pancreas without ductal dilatation. Spleen: No splenomegaly. Adrenals/Urinary Tract: Bilateral extrarenal pelves with chronic mild ectasia of the right renal collecting system. Tiny cortical cysts of both kidneys. There is mild ectasia of the right ureter to the level bladder. In the pelvis, the right distal ureter has a hyperdense appearance of (series 2 images 65 through 68 which may be due to streak artifacts from the patient's right bipolar hip arthroplasty. Alternatively intraluminal clots could have this appearance however no hyperdensities are seen within the distended bladder to suggest hemorrhage. The bladder is distended and contains a Foley catheter with balloon which may explain the air-fluid level seen within probably from instrumentation. Stomach/Bowel: Moderate colonic stool burden with gaseous distention of large bowel low without wall thickening or evidence of obstruction. Appendectomy. Vascular/Lymphatic: Aortic atherosclerosis. No enlarged abdominal or pelvic lymph nodes. Two calcified mesenteric densities just lateral to the right ureter, series 2 image 47 may represent calcified lymph nodes, measuring up to 6 mm short axis. Reproductive: Hysterectomy. Other: No ascites.  No abdominal wall hernia. Musculoskeletal: Lumbar fusion hardware noted from L3 through S1. No acute osseous abnormality IMPRESSION:  Moderate fecal retention within large bowel without obstruction. No acute bowel inflammation. Distended bladder with Foley catheter seen within likely explain the large air-fluid level noted within bladder. Stable postop lumbar fusion and right hip arthroplasty. Stable hepatic and bilateral renal cysts. Electronically Signed   By: Ashley Royalty M.D.   On: 06/29/2016 19:05   Ct Head Code Stroke W/o Cm  Result Date: 06/29/2016 CLINICAL DATA:  Code stroke.  Left-sided weakness. EXAM: CT HEAD WITHOUT CONTRAST TECHNIQUE: Contiguous axial images were obtained from the base of the skull through the vertex without intravenous contrast. COMPARISON:  None. FINDINGS: Brain: No evidence of acute infarction, hemorrhage, hydrocephalus, extra-axial collection or mass lesion/mass effect. Mild chronic microvascular ischemic changes and parenchymal volume loss. Vascular: No hyperdense vessel. Calcific atherosclerosis of cavernous internal carotid arteries per Skull: Normal. Negative for fracture or focal lesion. Sinuses/Orbits: No acute finding. Other: None. ASPECTS [facility] Stroke Program Early CT Score) - Ganglionic level infarction (caudate, lentiform nuclei, internal capsule, insula, M1-M3 cortex): 7 - Supraganglionic infarction (M4-M6 cortex): 3 Total score (0-10 with 10 being normal): 10  IMPRESSION: 1. No acute intracranial abnormality is identified. If symptoms persist or if clinically indicated MRI is more sensitive for acute stroke. 2. ASPECTS is 10 These results were called by telephone at the time of interpretation on 06/29/2016 at 6:52 pm to [place] , who verbally acknowledged these results. Electronically Signed   By: Kristine Garbe M.D.   On: 06/29/2016 18:54    Scheduled Meds: . brimonidine  1 drop Both Eyes BID  . dorzolamide-timolol  1 drop Both Eyes BID  . latanoprost  1 drop Both Eyes QHS  . levothyroxine  125 mcg Oral QAC breakfast  . oxyCODONE  10 mg Oral Q12H  . polyethylene glycol  17  g Oral Daily  . propranolol  20 mg Oral Daily   Continuous Infusions:    LOS: 2 days   Sarayah Bacchi, Orpah Melter, MD [facility] Pager [phone]  If 7PM-7AM, please contact night-coverage [email or web address] Password [email or web address] 07/01/2016, 4:45 PM

## 2016-07-01 NOTE — Plan of Care (Signed)
Problem: Skin Integrity: Goal: Risk for impaired skin integrity will decrease Outcome: Not Progressing Pt placed on air overlay mattress, but is resistant to regular turning due to severe neck pain.

## 2016-07-02 DIAGNOSIS — H3530 Unspecified macular degeneration: Secondary | ICD-10-CM | POA: Diagnosis not present

## 2016-07-02 DIAGNOSIS — E871 Hypo-osmolality and hyponatremia: Secondary | ICD-10-CM | POA: Diagnosis not present

## 2016-07-02 DIAGNOSIS — G2581 Restless legs syndrome: Secondary | ICD-10-CM | POA: Diagnosis not present

## 2016-07-02 DIAGNOSIS — G8929 Other chronic pain: Secondary | ICD-10-CM | POA: Diagnosis not present

## 2016-07-02 DIAGNOSIS — Z9189 Other specified personal risk factors, not elsewhere classified: Secondary | ICD-10-CM | POA: Diagnosis not present

## 2016-07-02 DIAGNOSIS — M4800 Spinal stenosis, site unspecified: Secondary | ICD-10-CM | POA: Diagnosis not present

## 2016-07-02 DIAGNOSIS — E860 Dehydration: Secondary | ICD-10-CM | POA: Diagnosis not present

## 2016-07-02 DIAGNOSIS — F411 Generalized anxiety disorder: Secondary | ICD-10-CM | POA: Diagnosis not present

## 2016-07-02 DIAGNOSIS — L89150 Pressure ulcer of sacral region, unstageable: Secondary | ICD-10-CM | POA: Diagnosis not present

## 2016-07-02 DIAGNOSIS — I10 Essential (primary) hypertension: Secondary | ICD-10-CM | POA: Diagnosis not present

## 2016-07-02 DIAGNOSIS — R1310 Dysphagia, unspecified: Secondary | ICD-10-CM | POA: Diagnosis not present

## 2016-07-02 DIAGNOSIS — S129XXA Fracture of neck, unspecified, initial encounter: Secondary | ICD-10-CM | POA: Diagnosis not present

## 2016-07-02 DIAGNOSIS — F329 Major depressive disorder, single episode, unspecified: Secondary | ICD-10-CM | POA: Diagnosis not present

## 2016-07-02 DIAGNOSIS — H409 Unspecified glaucoma: Secondary | ICD-10-CM | POA: Diagnosis not present

## 2016-07-02 DIAGNOSIS — S14109S Unspecified injury at unspecified level of cervical spinal cord, sequela: Secondary | ICD-10-CM | POA: Diagnosis not present

## 2016-07-02 DIAGNOSIS — M4802 Spinal stenosis, cervical region: Secondary | ICD-10-CM | POA: Diagnosis not present

## 2016-07-02 DIAGNOSIS — E875 Hyperkalemia: Secondary | ICD-10-CM | POA: Diagnosis not present

## 2016-07-02 DIAGNOSIS — N179 Acute kidney failure, unspecified: Secondary | ICD-10-CM | POA: Diagnosis not present

## 2016-07-02 DIAGNOSIS — Z466 Encounter for fitting and adjustment of urinary device: Secondary | ICD-10-CM | POA: Diagnosis not present

## 2016-07-02 DIAGNOSIS — F419 Anxiety disorder, unspecified: Secondary | ICD-10-CM | POA: Diagnosis not present

## 2016-07-02 DIAGNOSIS — R1312 Dysphagia, oropharyngeal phase: Secondary | ICD-10-CM | POA: Diagnosis not present

## 2016-07-02 DIAGNOSIS — M6281 Muscle weakness (generalized): Secondary | ICD-10-CM | POA: Diagnosis not present

## 2016-07-02 DIAGNOSIS — G4700 Insomnia, unspecified: Secondary | ICD-10-CM | POA: Diagnosis not present

## 2016-07-02 DIAGNOSIS — R531 Weakness: Secondary | ICD-10-CM | POA: Diagnosis not present

## 2016-07-02 DIAGNOSIS — L98499 Non-pressure chronic ulcer of skin of other sites with unspecified severity: Secondary | ICD-10-CM | POA: Diagnosis not present

## 2016-07-02 DIAGNOSIS — R748 Abnormal levels of other serum enzymes: Secondary | ICD-10-CM | POA: Diagnosis not present

## 2016-07-02 DIAGNOSIS — K5903 Drug induced constipation: Secondary | ICD-10-CM | POA: Diagnosis not present

## 2016-07-02 DIAGNOSIS — R627 Adult failure to thrive: Secondary | ICD-10-CM | POA: Diagnosis not present

## 2016-07-02 DIAGNOSIS — E890 Postprocedural hypothyroidism: Secondary | ICD-10-CM | POA: Diagnosis not present

## 2016-07-02 DIAGNOSIS — L89159 Pressure ulcer of sacral region, unspecified stage: Secondary | ICD-10-CM | POA: Diagnosis not present

## 2016-07-02 DIAGNOSIS — N189 Chronic kidney disease, unspecified: Secondary | ICD-10-CM | POA: Diagnosis not present

## 2016-07-02 LAB — CBC
HCT: 35.9 % — ABNORMAL LOW (ref 36.0–46.0)
HEMOGLOBIN: 11.3 g/dL — AB (ref 12.0–15.0)
MCH: 30.8 pg (ref 26.0–34.0)
MCHC: 31.5 g/dL (ref 30.0–36.0)
MCV: 97.8 fL (ref 78.0–100.0)
Platelets: 421 10*3/uL — ABNORMAL HIGH (ref 150–400)
RBC: 3.67 MIL/uL — AB (ref 3.87–5.11)
RDW: 14 % (ref 11.5–15.5)
WBC: 13.1 10*3/uL — ABNORMAL HIGH (ref 4.0–10.5)

## 2016-07-02 LAB — BASIC METABOLIC PANEL
Anion gap: 6 (ref 5–15)
BUN: 48 mg/dL — ABNORMAL HIGH (ref 6–20)
CHLORIDE: 103 mmol/L (ref 101–111)
CO2: 24 mmol/L (ref 22–32)
Calcium: 8.2 mg/dL — ABNORMAL LOW (ref 8.9–10.3)
Creatinine, Ser: 0.69 mg/dL (ref 0.44–1.00)
GFR calc non Af Amer: >60 mL/min (ref >60)
Glucose, Bld: 91 mg/dL (ref 65–99)
POTASSIUM: 4.6 mmol/L (ref 3.5–5.1)
SODIUM: 133 mmol/L — AB (ref 135–145)

## 2016-07-02 MED ORDER — METHYLNALTREXONE BROMIDE 12 MG/0.6ML ~~LOC~~ SOLN: 12.0000 mg | Freq: Once | SUBCUTANEOUS | Status: DC

## 2016-07-02 MED ORDER — BISACODYL 10 MG RE SUPP
10.0000 mg | Freq: Once | RECTAL | Status: AC
  Administered 2016-07-02: 10 mg via RECTAL
  Filled 2016-07-02: qty 1

## 2016-07-02 MED ORDER — SORBITOL 70 % SOLN
30.0000 mL | Status: AC
  Administered 2016-07-02: 30 mL via ORAL
  Filled 2016-07-02: qty 30

## 2016-07-02 MED ORDER — METHYLNALTREXONE BROMIDE 12 MG/0.6ML ~~LOC~~ SOLN
8.0000 mg | Freq: Once | SUBCUTANEOUS | Status: AC
  Administered 2016-07-02: 8 mg via SUBCUTANEOUS
  Filled 2016-07-02: qty 0.6

## 2016-07-02 MED ORDER — OXYCODONE HCL 5 MG PO TABS: 5.0000 mg | ORAL_TABLET | ORAL | 0 refills | Status: DC | PRN

## 2016-07-02 MED ORDER — OXYCODONE HCL ER 10 MG PO T12A: 10.0000 mg | EXTENDED_RELEASE_TABLET | Freq: Two times a day (BID) | ORAL | 0 refills | Status: DC

## 2016-07-02 MED ORDER — LACTULOSE 10 GM/15ML PO SOLN
30.0000 g | Freq: Once | ORAL | Status: AC
  Administered 2016-07-02: 30 g via ORAL
  Filled 2016-07-02: qty 45

## 2016-07-02 MED ORDER — POLYETHYLENE GLYCOL 3350 17 G PO PACK: 17.0000 g | PACK | Freq: Every day | ORAL | 0 refills | Status: DC

## 2016-07-02 NOTE — Progress Notes (Signed)
Soap suds enema administered per MD order. Pt not able to retain enema, liquid pouring out of rectum as enema is going in.  MD notified.

## 2016-07-02 NOTE — Progress Notes (Addendum)
Patient has not voided since in and out cath at 0200. States does not feel any urge to void and is not able.  Bladder scan shows >400cc of urine in bladder.  Foley cath reinserted per MD order. Clear yellow urine returned.  Patient tolerated procedure well. Will continue to monitor.

## 2016-07-02 NOTE — Discharge Summary (Signed)
Physician Discharge Summary  [name] [id number] DOB: August 22, 1925 DOA: 06/29/2016  PCP: Merrilee Seashore, MD  Admit date: 06/29/2016 Discharge date: 07/02/2016  Admitted From: Home Disposition:  SNF  Recommendations for Outpatient Follow-up:  1. Follow up with PCP in 1-2 weeks 2. Recommend consulting Palliative Care at facility  [place] Controlled substance registry searched. 25 days of oxycodone dispensed on 10/9. Will give limited rx for new narcotic regimen as facility requests prescription  Discharge Condition:Stable CODE STATUS:DNR Diet recommendation: Heart healthy   Brief/Interim Summary: 82 y.o.femalewith medical history significant of significant cervical stenosis not a surgical candidate, decub ulcer was just d/c last week to home after recommended going to a rehab/SNF but she declined this offer comes back in for worsening right sided weakness and not eating very well. She denies fevers. She says she has not been able to get out of bed in 4 days as she cannot move her right side. She is able to talk normally, and move her face normally. She has been in a c- collar per neurosurgery recommendations at her discharge last week. She only has her husband to take care of her at home. She is still not wanted to go to a SNF. Repeat imaging of her neck is pending however dr Trenton Gammon with neurosurgery at [facility] was called by EDP and recommended no surgical intervention irregardless of her imaging, that surgery would still not be considered due to her high risk. He recommended for her to stay at [facility] and get palliative care involved. Pt found to be dehydrated, with AKI and worsening neurological symptoms in the setting of cpine stenosis  1. Right-sided weakness secondary to cervical spinal stenosis 1. Patient with severe cervical stenosis that is not operable, resulting in right-sided weakness 2. At time of admission, case was discussed with neurosurgeon on call who  states the patient is considered too high risk for surgery, thus recommendations for palliative measures instead 3. Appreciate input by palliative care 4. Patient previously adamant about going home. After discussion with patient and family, patient ultimately agreeable to skilled nursing facility. 2. Hypertension 1. Blood pressure currently stable and controlled  2. Presently off spironolactone per home regimen 3. Scheer force injury 1. Recommendations by wound ostomy nurse noted 2. Appears stable at this time 4. Acute renal failure 1. Improved with IVF hydration 5. Dehydration 1. Currently improved with IV fluids 6. Hyponatremia 1. Likely secondary to dehydration 2. Sodium has improved with IV fluid hydration  Discharge Diagnoses:  Principal Problem:   Weakness of right side of body Active Problems:   Spinal stenosis in cervical region   Essential hypertension   Pressure injury of skin   AKI (acute kidney injury) (HCC)   Dehydration   Constipation   Hyponatremia   Elevated troponin   Hyperkalemia   Quadriparesis (HCC)   Pain in neck   Palliative care encounter   Goals of care, counseling/discussion    Discharge Instructions     Medication List    STOP taking these medications   gabapentin 300 MG capsule Commonly known as:  NEURONTIN   spironolactone 25 MG tablet Commonly known as:  ALDACTONE     TAKE these medications   acetaminophen 500 MG tablet Commonly known as:  TYLENOL Take 500 mg by mouth every 4 (four) hours as needed for moderate pain.   brimonidine 0.2 % ophthalmic solution Commonly known as:  ALPHAGAN Place 1 drop into both eyes 2 (two) times daily.   dorzolamide-timolol 22.3-6.8 MG/ML ophthalmic  solution Commonly known as:  COSOPT Place 1 drop into both eyes 2 (two) times daily.   ibuprofen 200 MG tablet Commonly known as:  ADVIL,MOTRIN Take 200 mg by mouth every 4 (four) hours as needed for moderate pain.   latanoprost 0.005 %  ophthalmic solution Commonly known as:  XALATAN Place 1 drop into both eyes 2 (two) times daily.   levothyroxine 125 MCG tablet Commonly known as:  SYNTHROID, LEVOTHROID Take 1 tablet (125 mcg total) by mouth daily before breakfast.   oxyCODONE 10 mg 12 hr tablet Commonly known as:  OXYCONTIN Take 1 tablet (10 mg total) by mouth every 12 (twelve) hours. What changed:  when to take this   oxyCODONE 5 MG immediate release tablet Commonly known as:  Oxy IR/ROXICODONE Take 1 tablet (5 mg total) by mouth every 4 (four) hours as needed for moderate pain or severe pain (First line pain medication.  Please give prior to giving IV medications.). What changed:  You were already taking a medication with the same name, and this prescription was added. Make sure you understand how and when to take each.   polyethylene glycol packet Commonly known as:  MIRALAX / GLYCOLAX Take 17 g by mouth daily. Start taking on:  07/03/2016   propranolol 20 MG tablet Commonly known as:  INDERAL Take 20 mg by mouth daily. Takes for migraines.   traZODone 50 MG tablet Commonly known as:  DESYREL Take 50 mg by mouth at bedtime.       Allergies  Allergen Reactions  . Demerol Shortness Of Breath  . Penicillins Swelling    "in the face" Has patient had a PCN reaction causing immediate rash, facial/tongue/throat swelling, SOB or lightheadedness with hypotension: yes Has patient had a PCN reaction causing severe rash involving mucus membranes or skin necrosis: no Has patient had a PCN reaction that required hospitalization: no  Has patient had a PCN reaction occurring within the last 10 years: no If all of the above answers are "NO", then may proceed with Cephalosporin use.   . Corticosteroids Other (See Comments)    DIZZINESS, HEADACHE  . Lorazepam Anxiety  . Tetracyclines & Related Rash  . Vancomycin Rash    Consultations:  Palliative Care  Procedures/Studies: Ct Head Wo Contrast  Result Date:  06/19/2016 CLINICAL DATA:  Acute onset of right-sided headache, radiating down the right side of the neck. High blood pressure. Initial encounter. EXAM: CT HEAD WITHOUT CONTRAST CT CERVICAL SPINE WITHOUT CONTRAST TECHNIQUE: Multidetector CT imaging of the head and cervical spine was performed following the standard protocol without intravenous contrast. Multiplanar CT image reconstructions of the cervical spine were also generated. COMPARISON:  CT of the head performed 04/25/2016, and CT of the cervical spine performed 03/29/2016. MRI of the cervical spine performed 05/16/2016 FINDINGS: CT HEAD FINDINGS Brain: No evidence of acute infarction, hemorrhage, hydrocephalus, extra-axial collection or mass lesion/mass effect. Prominence of the ventricles sulci reflects mild cortical volume loss. Scattered periventricular white matter change likely reflects small vessel ischemic microangiopathy. The brainstem and fourth ventricle are within normal limits. The basal ganglia are unremarkable in appearance. The cerebral hemispheres demonstrate grossly normal gray-white differentiation. No mass effect or midline shift is seen. Vascular: No hyperdense vessel or unexpected calcification. Skull: There is no evidence of fracture; visualized osseous structures are unremarkable in appearance. Sinuses/Orbits: The orbits are within normal limits. The paranasal sinuses and mastoid air cells are well-aerated. Other: No significant soft tissue abnormalities are seen. CT CERVICAL SPINE FINDINGS Alignment:  Normal. Skull base and vertebrae: No acute fracture. No primary bone lesion or focal pathologic process. There is a chronic relatively stable fracture of the upper dens, grossly unchanged from the prior studies. Soft tissues and spinal canal: No prevertebral fluid or swelling. No visible canal hematoma. Chronic central canal stenosis at C1-C2 reflects the fracture described above. Disc levels: Multilevel disc space narrowing is noted,  with loss of the disc spaces at C2-C4, reflecting some degree of osseous fusion. Scattered anterior and posterior disc osteophyte complexes are noted along the cervical and upper thoracic spine. Upper chest: Minimal scarring is noted at the lung apices. The patient is status post thyroidectomy. Other: No additional soft tissue abnormalities are seen. IMPRESSION: 1. No evidence of traumatic intracranial injury or fracture. 2. No evidence of fracture or subluxation along the cervical spine. 3. Mild cortical volume loss and scattered small vessel ischemic microangiopathy. 4. Diffuse degenerative change along the cervical spine, with chronic osseous fusion at C2-C4. 5. Chronic relatively stable fracture of the upper dens, with underlying chronic central canal stenosis, better characterized on prior MRI. 6. Minimal scarring at the lung apices. Electronically Signed   By: Garald Balding M.D.   On: 06/19/2016 20:51   Ct Cervical Spine Wo Contrast  Result Date: 06/29/2016 CLINICAL DATA:  Worsening right-sided weakness. EXAM: CT CERVICAL SPINE WITHOUT CONTRAST TECHNIQUE: Multidetector CT imaging of the cervical spine was performed without intravenous contrast. Multiplanar CT image reconstructions were also generated. COMPARISON:  Cervical spine CT 06/19/2016, cervical spine MRI 05/16/2016 FINDINGS: Alignment: There is straightening of the normal cervical lordosis. No for acute static subluxation. The cervical facets are aligned. Skull base and vertebrae: There is a unchanged chronic type 2 fracture of the dens with mild posterior angulation of the superior fragment. No acute cervical spine fracture is identified. Soft tissues and spinal canal: No prevertebral fluid or swelling. No visible canal hematoma. Disc levels: Unchanged spinal canal stenosis at the C1-C2 level. There is multilevel facet and uncovertebral hypertrophy, but there is no severe bony foraminal stenosis. The worst stenosis is at the left C3-C4 and right  C4-C5 levels. There is osseous fusion at the C2-C4 levels. Upper chest: No pneumothorax, pulmonary nodule or pleural effusion. Unchanged biapical scarring Other: Normal visualized paraspinal cervical soft tissues. IMPRESSION: 1. No acute fracture or static subluxation of the cervical spine. 2. Unchanged appearance of type 2 chronic dens fracture with associated spinal canal stenosis at the C1-2 level. 3. Unchanged advanced cervical spine degenerative findings. Electronically Signed   By: Ulyses Jarred M.D.   On: 06/29/2016 22:37   Ct Cervical Spine Wo Contrast  Result Date: 06/19/2016 CLINICAL DATA:  Acute onset of right-sided headache, radiating down the right side of the neck. High blood pressure. Initial encounter. EXAM: CT HEAD WITHOUT CONTRAST CT CERVICAL SPINE WITHOUT CONTRAST TECHNIQUE: Multidetector CT imaging of the head and cervical spine was performed following the standard protocol without intravenous contrast. Multiplanar CT image reconstructions of the cervical spine were also generated. COMPARISON:  CT of the head performed 04/25/2016, and CT of the cervical spine performed 03/29/2016. MRI of the cervical spine performed 05/16/2016 FINDINGS: CT HEAD FINDINGS Brain: No evidence of acute infarction, hemorrhage, hydrocephalus, extra-axial collection or mass lesion/mass effect. Prominence of the ventricles sulci reflects mild cortical volume loss. Scattered periventricular white matter change likely reflects small vessel ischemic microangiopathy. The brainstem and fourth ventricle are within normal limits. The basal ganglia are unremarkable in appearance. The cerebral hemispheres demonstrate grossly normal gray-white differentiation. No  mass effect or midline shift is seen. Vascular: No hyperdense vessel or unexpected calcification. Skull: There is no evidence of fracture; visualized osseous structures are unremarkable in appearance. Sinuses/Orbits: The orbits are within normal limits. The paranasal  sinuses and mastoid air cells are well-aerated. Other: No significant soft tissue abnormalities are seen. CT CERVICAL SPINE FINDINGS Alignment: Normal. Skull base and vertebrae: No acute fracture. No primary bone lesion or focal pathologic process. There is a chronic relatively stable fracture of the upper dens, grossly unchanged from the prior studies. Soft tissues and spinal canal: No prevertebral fluid or swelling. No visible canal hematoma. Chronic central canal stenosis at C1-C2 reflects the fracture described above. Disc levels: Multilevel disc space narrowing is noted, with loss of the disc spaces at C2-C4, reflecting some degree of osseous fusion. Scattered anterior and posterior disc osteophyte complexes are noted along the cervical and upper thoracic spine. Upper chest: Minimal scarring is noted at the lung apices. The patient is status post thyroidectomy. Other: No additional soft tissue abnormalities are seen. IMPRESSION: 1. No evidence of traumatic intracranial injury or fracture. 2. No evidence of fracture or subluxation along the cervical spine. 3. Mild cortical volume loss and scattered small vessel ischemic microangiopathy. 4. Diffuse degenerative change along the cervical spine, with chronic osseous fusion at C2-C4. 5. Chronic relatively stable fracture of the upper dens, with underlying chronic central canal stenosis, better characterized on prior MRI. 6. Minimal scarring at the lung apices. Electronically Signed   By: Garald Balding M.D.   On: 06/19/2016 20:51   Mr Brain Wo Contrast  Addendum Date: 06/20/2016   ADDENDUM REPORT: 06/20/2016 09:52 ADDENDUM: Critical Value/emergent results were called by telephone at the time of interpretation on 06/20/2016 at 0935 hours to ED PA Alecia Lemming, who verbally acknowledged these results. Electronically Signed   By: Genevie Ann M.D.   On: 06/20/2016 09:52   Result Date: 06/20/2016 CLINICAL DATA:  82 year old female with progressive right side  weakness/paralysis x1 month. C1 vertebral and Type 2 dens fractures sustained in May with subsequent C1-C2 spinal stenosis. Initial encounter. EXAM: MRI HEAD WITHOUT CONTRAST MRI CERVICAL SPINE WITHOUT CONTRAST TECHNIQUE: Multiplanar, multiecho pulse sequences of the brain and surrounding structures, and cervical spine, to include the craniocervical junction and cervicothoracic junction, were obtained without intravenous contrast. COMPARISON:  Head and cervical spine CT 06/19/2016 and earlier. Cervical spine MRI 05/16/2016. FINDINGS: MRI HEAD FINDINGS Brain: No restricted diffusion to suggest acute infarction. No midline shift, mass effect, evidence of mass lesion, ventriculomegaly, extra-axial collection or acute intracranial hemorrhage. Pituitary within normal limits. [place] Cables and white matter signal is within normal limits for age throughout the brain. No abnormal signal in the brainstem. Vascular: Major intracranial vascular flow voids appear within normal limits. Skull and upper cervical spine: Reported below. Sinuses/Orbits: Postoperative changes the globes, otherwise negative orbits soft tissues. Visualized paranasal sinuses and mastoids are stable and well pneumatized. Other: Visible internal auditory structures appear normal. Negative scalp soft tissues. MRI CERVICAL SPINE FINDINGS Study is intermittently degraded by motion artifact despite repeated imaging attempts. Alignment: C2 through C4 interbody and posterior element ankylosis. Superimposed left side C4-C5 posterior element ankylosis and C5-C6 interbody ankylosis. Subsequently, the cervical spine is essentially fused from the C2 body to the C6 level. Alignment of this fused segments is stable over these series of exams. Un healed type 2 odontoid fracture persists as seen on the CT yesterday. Vertebrae:   No new osseous abnormality identified. Cord: Ligamentous hypertrophy about the odontoid and especially  posterior ligamentous hypertrophy about the  posterior C1 ring again noted and combine for moderate spinal stenosis at the C1-C2 level (series 16, image 11 and series 14, image 7) which appears mildly progressed since September. Subsequent moderate mass effect on the spinal cord in the AP direction (series 16, image 11). No definite cord edema. Below C1-C2 there is additional multilevel cervical spinal stenosis. No definite cord signal abnormality at those levels. Visualized upper thoracic spinal cord appears within normal limits. Posterior Fossa, vertebral arteries, paraspinal tissues: Posterior fossa detailed above. New since 05/16/2016 abnormal increased prevertebral confluent T2 and STIR hyperintensity from the skullbase to C6-C7 (series 14, image 5). New confluent abnormal posterior paraspinal muscle edema (seen in the midline on also series 14, image 5). Other bilateral neck soft tissues appear stable. Preserved major vascular flow voids. Partially retropharyngeal course of both carotids. Disc levels: Degenerative multifactorial spinal stenosis at C2-C3, C3-C4, C4-C5, C5-C6, and C6-C7 appears stable since September. IMPRESSION: 1. Mild further progression since September of moderate C1-C2 spinal stenosis with upper cervical cord compression due to ligamentous hypertrophy and mild bony displacement about the un-healed type 2 odontoid fracture. 2. No definite cervical spinal cord edema or signal abnormality. Multifactorial degenerative spinal stenosis also from C2-C3 through C6-C7 appears stable since September, including some lesser cord mass effect. 3. New diffuse abnormal signal in the posterior paraspinal muscles. If there has been no interval trauma then perhaps this reflects widespread a Muscle Denervation Injury related to the progressive compressive myelopathy. 4. Increased abnormal prevertebral soft tissue edema also noted, nonspecific. 5. Underlying chronic cervical spine ankylosis from the C2 body to the C6 vertebra. 6. No acute intracranial  abnormality. Normal for age noncontrast MRI appearance of the brain. Electronically Signed: By: Genevie Ann M.D. On: 06/20/2016 09:30   Mr Cervical Spine Wo Contrast  Addendum Date: 06/20/2016   ADDENDUM REPORT: 06/20/2016 09:52 ADDENDUM: Critical Value/emergent results were called by telephone at the time of interpretation on 06/20/2016 at 0935 hours to ED PA Alecia Lemming, who verbally acknowledged these results. Electronically Signed   By: Genevie Ann M.D.   On: 06/20/2016 09:52   Result Date: 06/20/2016 CLINICAL DATA:  82 year old female with progressive right side weakness/paralysis x1 month. C1 vertebral and Type 2 dens fractures sustained in May with subsequent C1-C2 spinal stenosis. Initial encounter. EXAM: MRI HEAD WITHOUT CONTRAST MRI CERVICAL SPINE WITHOUT CONTRAST TECHNIQUE: Multiplanar, multiecho pulse sequences of the brain and surrounding structures, and cervical spine, to include the craniocervical junction and cervicothoracic junction, were obtained without intravenous contrast. COMPARISON:  Head and cervical spine CT 06/19/2016 and earlier. Cervical spine MRI 05/16/2016. FINDINGS: MRI HEAD FINDINGS Brain: No restricted diffusion to suggest acute infarction. No midline shift, mass effect, evidence of mass lesion, ventriculomegaly, extra-axial collection or acute intracranial hemorrhage. Pituitary within normal limits. [place] Cables and white matter signal is within normal limits for age throughout the brain. No abnormal signal in the brainstem. Vascular: Major intracranial vascular flow voids appear within normal limits. Skull and upper cervical spine: Reported below. Sinuses/Orbits: Postoperative changes the globes, otherwise negative orbits soft tissues. Visualized paranasal sinuses and mastoids are stable and well pneumatized. Other: Visible internal auditory structures appear normal. Negative scalp soft tissues. MRI CERVICAL SPINE FINDINGS Study is intermittently degraded by motion artifact despite  repeated imaging attempts. Alignment: C2 through C4 interbody and posterior element ankylosis. Superimposed left side C4-C5 posterior element ankylosis and C5-C6 interbody ankylosis. Subsequently, the cervical spine is essentially fused from the C2 body to the C6  level. Alignment of this fused segments is stable over these series of exams. Un healed type 2 odontoid fracture persists as seen on the CT yesterday. Vertebrae:   No new osseous abnormality identified. Cord: Ligamentous hypertrophy about the odontoid and especially posterior ligamentous hypertrophy about the posterior C1 ring again noted and combine for moderate spinal stenosis at the C1-C2 level (series 16, image 11 and series 14, image 7) which appears mildly progressed since September. Subsequent moderate mass effect on the spinal cord in the AP direction (series 16, image 11). No definite cord edema. Below C1-C2 there is additional multilevel cervical spinal stenosis. No definite cord signal abnormality at those levels. Visualized upper thoracic spinal cord appears within normal limits. Posterior Fossa, vertebral arteries, paraspinal tissues: Posterior fossa detailed above. New since 05/16/2016 abnormal increased prevertebral confluent T2 and STIR hyperintensity from the skullbase to C6-C7 (series 14, image 5). New confluent abnormal posterior paraspinal muscle edema (seen in the midline on also series 14, image 5). Other bilateral neck soft tissues appear stable. Preserved major vascular flow voids. Partially retropharyngeal course of both carotids. Disc levels: Degenerative multifactorial spinal stenosis at C2-C3, C3-C4, C4-C5, C5-C6, and C6-C7 appears stable since September. IMPRESSION: 1. Mild further progression since September of moderate C1-C2 spinal stenosis with upper cervical cord compression due to ligamentous hypertrophy and mild bony displacement about the un-healed type 2 odontoid fracture. 2. No definite cervical spinal cord edema or  signal abnormality. Multifactorial degenerative spinal stenosis also from C2-C3 through C6-C7 appears stable since September, including some lesser cord mass effect. 3. New diffuse abnormal signal in the posterior paraspinal muscles. If there has been no interval trauma then perhaps this reflects widespread a Muscle Denervation Injury related to the progressive compressive myelopathy. 4. Increased abnormal prevertebral soft tissue edema also noted, nonspecific. 5. Underlying chronic cervical spine ankylosis from the C2 body to the C6 vertebra. 6. No acute intracranial abnormality. Normal for age noncontrast MRI appearance of the brain. Electronically Signed: By: Genevie Ann M.D. On: 06/20/2016 09:30   Ct Abdomen Pelvis W Contrast  Result Date: 06/29/2016 CLINICAL DATA:  Abdominal distention. Patient is more lethargic normal. EXAM: CT ABDOMEN AND PELVIS WITH CONTRAST TECHNIQUE: Multidetector CT imaging of the abdomen and pelvis was performed using the standard protocol following bolus administration of intravenous contrast. CONTRAST:  69mL ISOVUE-300 IOPAMIDOL (ISOVUE-300) INJECTION 61% COMPARISON:  CT from 02/18/2014 FINDINGS: Lower chest: Bibasilar atelectasis. No effusion or pneumothorax. Top normal size cardiac chambers. No pericardial effusion. Hepatobiliary: Colonic interposition over the liver is noted. There are least half a dozen or so well-circumscribed hypodensities scattered throughout the liver, the largest in the left hepatic lobe measuring 17 mm. These do not appear to enhance nor filled in on delayed repeat images through the upper abdomen suggesting hepatic cysts. No significant progression in size or number since prior. The gallbladder is free of stones. No wall thickening noted. No biliary dilatation. Pancreas: Atrophic pancreas without ductal dilatation. Spleen: No splenomegaly. Adrenals/Urinary Tract: Bilateral extrarenal pelves with chronic mild ectasia of the right renal collecting system.  Tiny cortical cysts of both kidneys. There is mild ectasia of the right ureter to the level bladder. In the pelvis, the right distal ureter has a hyperdense appearance of (series 2 images 65 through 68 which may be due to streak artifacts from the patient's right bipolar hip arthroplasty. Alternatively intraluminal clots could have this appearance however no hyperdensities are seen within the distended bladder to suggest hemorrhage. The bladder is distended and contains  a Foley catheter with balloon which may explain the air-fluid level seen within probably from instrumentation. Stomach/Bowel: Moderate colonic stool burden with gaseous distention of large bowel low without wall thickening or evidence of obstruction. Appendectomy. Vascular/Lymphatic: Aortic atherosclerosis. No enlarged abdominal or pelvic lymph nodes. Two calcified mesenteric densities just lateral to the right ureter, series 2 image 47 may represent calcified lymph nodes, measuring up to 6 mm short axis. Reproductive: Hysterectomy. Other: No ascites.  No abdominal wall hernia. Musculoskeletal: Lumbar fusion hardware noted from L3 through S1. No acute osseous abnormality IMPRESSION: Moderate fecal retention within large bowel without obstruction. No acute bowel inflammation. Distended bladder with Foley catheter seen within likely explain the large air-fluid level noted within bladder. Stable postop lumbar fusion and right hip arthroplasty. Stable hepatic and bilateral renal cysts. Electronically Signed   By: Ashley Royalty M.D.   On: 06/29/2016 19:05   Ct Head Code Stroke W/o Cm  Result Date: 06/29/2016 CLINICAL DATA:  Code stroke.  Left-sided weakness. EXAM: CT HEAD WITHOUT CONTRAST TECHNIQUE: Contiguous axial images were obtained from the base of the skull through the vertex without intravenous contrast. COMPARISON:  None. FINDINGS: Brain: No evidence of acute infarction, hemorrhage, hydrocephalus, extra-axial collection or mass lesion/mass  effect. Mild chronic microvascular ischemic changes and parenchymal volume loss. Vascular: No hyperdense vessel. Calcific atherosclerosis of cavernous internal carotid arteries per Skull: Normal. Negative for fracture or focal lesion. Sinuses/Orbits: No acute finding. Other: None. ASPECTS [facility] Stroke Program Early CT Score) - Ganglionic level infarction (caudate, lentiform nuclei, internal capsule, insula, M1-M3 cortex): 7 - Supraganglionic infarction (M4-M6 cortex): 3 Total score (0-10 with 10 being normal): 10 IMPRESSION: 1. No acute intracranial abnormality is identified. If symptoms persist or if clinically indicated MRI is more sensitive for acute stroke. 2. ASPECTS is 10 These results were called by telephone at the time of interpretation on 06/29/2016 at 6:52 pm to [place] , who verbally acknowledged these results. Electronically Signed   By: Kristine Garbe M.D.   On: 06/29/2016 18:54     Subjective: Complaining of constipation  Discharge Exam: Vitals:   07/01/16 2045 07/02/16 0602  BP: 133/61 (!) 166/63  Pulse: 74 74  Resp: 20 20  Temp: 98.2 F (36.8 C) 98.4 F (36.9 C)   Vitals:   07/01/16 0650 07/01/16 1443 07/01/16 2045 07/02/16 0602  BP:  (!) 108/40 133/61 (!) 166/63  Pulse: 71 86 74 74  Resp: 20 16 20 20   Temp: 97.9 F (36.6 C) 98.1 F (36.7 C) 98.2 F (36.8 C) 98.4 F (36.9 C)  TempSrc: Oral Oral Oral Oral  SpO2:  96% 96% 97%  Weight:      Height:        General: Pt is alert, awake, not in acute distress Cardiovascular: RRR, S1/S2 +, no rubs, no gallops Respiratory: CTA bilaterally, no wheezing, no rhonchi Abdominal: Soft, NT, ND, bowel sounds + Extremities: no edema, no cyanosis   The results of significant diagnostics from this hospitalization (including imaging, microbiology, ancillary and laboratory) are listed below for reference.     Microbiology: No results found for this or any previous visit (from the past 240 hour(s)).    Labs: BNP (last 3 results) No results for input(s): BNP in the last 8760 hours. Basic Metabolic Panel:  Recent Labs Lab 06/29/16 1809 06/30/16 0421 07/02/16 0425  NA 123* 128* 133*  K 5.6* 4.1 4.6  CL 88* 99* 103  CO2 23 21* 24  GLUCOSE 121* 155* 91  BUN 129* 98* 48*  CREATININE 1.71* 1.08* 0.69  CALCIUM 8.6* 7.7* 8.2*   Liver Function Tests:  Recent Labs Lab 06/29/16 1809  AST 26  ALT 17  ALKPHOS 78  BILITOT 1.0  PROT 6.5  ALBUMIN 3.5    Recent Labs Lab 06/29/16 1809  LIPASE 20   No results for input(s): AMMONIA in the last 168 hours. CBC:  Recent Labs Lab 06/29/16 1809 06/30/16 0421 07/02/16 0425  WBC 19.3* 15.5* 13.1*  NEUTROABS 16.7*  --   --   HGB 12.7 9.9* 11.3*  HCT 37.6 29.3* 35.9*  MCV 92.4 92.7 97.8  PLT 385 336 421*   Cardiac Enzymes:  Recent Labs Lab 06/29/16 1809 06/29/16 2221 06/30/16 0421 06/30/16 1043  TROPONINI 0.24* 0.21* 0.21* 0.18*   BNP: Invalid input(s): POCBNP CBG: No results for input(s): GLUCAP in the last 168 hours. D-Dimer No results for input(s): DDIMER in the last 72 hours. Hgb A1c No results for input(s): HGBA1C in the last 72 hours. Lipid Profile No results for input(s): CHOL, HDL, LDLCALC, TRIG, CHOLHDL, LDLDIRECT in the last 72 hours. Thyroid function studies No results for input(s): TSH, T4TOTAL, T3FREE, THYROIDAB in the last 72 hours.  Invalid input(s): FREET3 Anemia work up No results for input(s): VITAMINB12, FOLATE, FERRITIN, TIBC, IRON, RETICCTPCT in the last 72 hours. Urinalysis    Component Value Date/Time   COLORURINE YELLOW 06/29/2016 1820   APPEARANCEUR CLEAR 06/29/2016 1820   LABSPEC 1.018 06/29/2016 1820   PHURINE 5.5 06/29/2016 1820   GLUCOSEU NEGATIVE 06/29/2016 1820   HGBUR NEGATIVE 06/29/2016 1820   BILIRUBINUR NEGATIVE 06/29/2016 1820   KETONESUR NEGATIVE 06/29/2016 1820   PROTEINUR NEGATIVE 06/29/2016 1820   UROBILINOGEN 1.0 02/18/2014 1617   NITRITE NEGATIVE 06/29/2016  1820   LEUKOCYTESUR NEGATIVE 06/29/2016 1820   Sepsis Labs Invalid input(s): PROCALCITONIN,  WBC,  LACTICIDVEN Microbiology No results found for this or any previous visit (from the past 240 hour(s)).   SIGNED:   Donne Hazel, MD  [facility] 07/02/2016, 1:04 PM  If 7PM-7AM, please contact night-coverage [email or web address] Password [email or web address]

## 2016-07-02 NOTE — Progress Notes (Signed)
Patient discharged to SNF, copies of discharge medications and instructions sent to facility. Patient to be transported via [place].

## 2016-07-02 NOTE — Evaluation (Signed)
Physical Therapy Evaluation Patient Details Name: [name] MRN: [id number] DOB: [birth year] Today's Date: 07/02/2016   History of Present Illness   [name] is a 82 y.o. female with medical history significant of significant cervical stenosis not a surgical candidate, decub ulcer was just d/c last week to home after recommended going to a rehab/SNF but she declined. Returned to ED10/27  for worsening right UE  weakness and not eating very well.  She has been in a c- collar per neurosurgery recommendations at her discharge last week.  She only has her husband to take care of her at home. Dr.Poole with neurosurgery  was called by EDP and recommended no surgical intervention irregardless of her imaging,  He recommended for her to [facility] and get palliative care involved.  Pt found to be dehydrated, with AKI and worsening neurological symptoms in the setting of cpine stenosis   and with C2 fx in hard collar. Patient  hx of falls, Anemia, HTN, Glaucoma, Macular Degeneration, multiple back surgeries, bil THRs, and Bil TKRs.    Clinical Impression  The patient was self limiting for the evaluation with complaints of nausea. The patient was insistent that the "doctor" said she did not need to wear the collar. A note by Dr. Ronnald Ramp indicates that she needs a collar for stability. Will inform the RN and patient. She now presents with a  Significant neurological change in RUE  Being flaccid since DC from  [facility] about 1 week ago.Pt admitted with above diagnosis. Pt currently with functional limitations due to the deficits listed below (see PT Problem List). Pt will benefit from skilled PT to increase their independence and safety with mobility to allow discharge to the venue listed below.       Follow Up Recommendations SNF;Supervision/Assistance - 24 hour    Equipment Recommendations  None recommended by PT    Recommendations for Other Services       Precautions / Restrictions  Precautions Precautions: Fall;Cervical Precaution Comments: The patient is insistent that the MD said she did not need the collar. Per note of 06/22/16 from Dr. Ronnald Ramp :"This is an extremely difficult situation. She will likely have to spend her remaining life in a cervical collar to offer some external stability." Required Braces or Orthoses: Cervical Brace Cervical Brace: Hard collar;At all times      Mobility  Bed Mobility Overal bed mobility: Needs Assistance Bed Mobility: Rolling Rolling: Max assist;Total assist         General bed mobility comments: cues to flex the legs to facilitate the roll, required max to the right and total to the left due to flaccid RUE. Patient declined to sit up siting  that she was too nauseated.  Transfers                    Ambulation/Gait                Stairs            Wheelchair Mobility    Modified Rankin (Stroke Patients Only)       Balance                                             Pertinent Vitals/Pain Pain Assessment: Faces Faces Pain Scale: Hurts little more Pain Location: neck Pain Descriptors / Indicators: Discomfort;Grimacing  Home Living Family/patient expects to be discharged to:: Skilled nursing facility Living Arrangements: Spouse/significant other Available Help at Discharge: Family;Available 24 hours/day Type of Home: House Home Access: Stairs to enter   [employer] of Steps: 3 Home Layout: Able to live on main level with bedroom/bathroom Home Equipment: Wheelchair - manual      Prior Function Level of Independence: Needs assistance   Gait / Transfers Assistance Needed: requires total care now that the RUE is flaccid           Hand Dominance        Extremity/Trunk Assessment   Upper Extremity Assessment: RUE deficits/detail;LUE deficits/detail RUE Deficits / Details: flaccid entire extremity     LUE Deficits / Details: ataxic, decreased  control of fine motor   Lower Extremity Assessment: RLE deficits/detail;LLE deficits/detail RLE Deficits / Details: grossly 2+ hip flexion, knee extension, dorsiflexion LLE Deficits / Details: grossly 3+ knee extension and hip flexion     Communication      Cognition Arousal/Alertness: Awake/alert   Overall Cognitive Status: Difficult to assess Area of Impairment: Orientation;Safety/judgement;Awareness                    General Comments      Exercises     Assessment/Plan    PT Assessment Patient needs continued PT services  PT Problem List Decreased strength;Decreased range of motion;Decreased activity tolerance;Decreased balance;Decreased mobility;Pain;Decreased skin integrity;Decreased knowledge of use of DME;Decreased cognition;Decreased safety awareness;Decreased knowledge of precautions;Impaired sensation;Decreased coordination          PT Treatment Interventions Functional mobility training;Therapeutic activities;Therapeutic exercise;Patient/family education    PT Goals (Current goals can be found in the Care Plan section)  Acute Rehab PT Goals Patient Stated Goal: i am going to [facility] PT Goal Formulation: With patient/family Time For Goal Achievement: 07/16/16 Potential to Achieve Goals: Fair    Frequency Min 2X/week   Barriers to discharge Decreased caregiver support      Co-evaluation               End of Session   Activity Tolerance: Patient limited by pain;Other (comment) (nausea, per RN due to constipation) Patient left: in bed;with call bell/phone within reach;with bed alarm set;with family/visitor present Nurse Communication: Mobility status (needs soft touch call bell.)         Time: 1150-1205 PT Time Calculation (min) (ACUTE ONLY): 15 min   Charges:   PT Evaluation $PT Eval Low Complexity: 1 Procedure     PT G CodesClaretha Cooper 07/02/2016, 1:02 PM Tresa Endo PT [phone]

## 2016-07-02 NOTE — Progress Notes (Signed)
CSW assisting with d/c planning. Met with pt this am to review SNF bed offers. Pt is presently declining SNF. After further discussion pt reports that she will discuss placement with her husband when he visits this am. CSW will continue to follow to assist with d/c planning needs.  Werner Lean LCSW [phone]

## 2016-07-02 NOTE — Clinical Social Work Placement (Addendum)
   CLINICAL SOCIAL WORK PLACEMENT  NOTE  Date:  07/02/2016  Patient Details  Name: [name] MRN: [id number] Date of Birth: [birth year]  Clinical Social Work is seeking post-discharge placement for this patient at the [place] level of care (*CSW will initial, date and re-position this form in  chart as items are completed):  Yes   Patient/family provided with [place] Work Department's list of facilities offering this level of care within the geographic area requested by the patient (or if unable, by the patient's family).  Yes   Patient/family informed of their freedom to choose among providers that offer the needed level of care, that participate in Medicare, Medicaid or managed care program needed by the patient, have an available bed and are willing to accept the patient.  Yes   Patient/family informed of [place]'s ownership interest in [facility] and [facility], as well as of the fact that they are under no obligation to receive care at these facilities.  PASRR submitted to EDS on 07/01/16     PASRR number received on 07/01/16     Existing PASRR number confirmed on       FL2 transmitted to all facilities in geographic area requested by pt/family on       FL2 transmitted to all facilities within larger geographic area on       Patient informed that his/her managed care company has contracts with or will negotiate with certain facilities, including the following:        Yes   Patient/family informed of bed offers received.  Patient chooses bed at [place] recommends and patient chooses bed at      Patient to be transferred to [facility] on 07/02/16.  Patient to be transferred to facility by [facility]     Patient family notified on 07/02/16 of transfer.  Name of family member notified:  SPOUSE     PHYSICIAN Please sign FL2     Additional Comment: Pt / spouse are in  agreement with d/c to [facility] today. [facility] transport is required. Medical necessity form completed. Pt is aware out of pocket costs may be associated with [facility] transport. D/C Summary sent to SNF for review. Scripts included in d/c packet. # for report provided to nsg. NSG to contact [facility] when ready for d/c today.    _______________________________________________ Luretha Rued, [place] 07/02/2016, 3:01 PM

## 2016-07-02 NOTE — Progress Notes (Signed)
Foley removed at 1715 on previous shift. At 0100 pt had went almost 8 hours with voiding. She denied any urge to void, but tried using the bedpan without success. Bladder scan 353cc. Paged NP and received order for I&O cath, resulting in 400cc. Changed sacral foam dressing and cleansed wound due to presence of purulent foul odor drainage. Continue to monitor. Hortencia Conradi RN

## 2016-07-03 ENCOUNTER — Encounter: Payer: Self-pay | Admitting: Internal Medicine

## 2016-07-03 ENCOUNTER — Non-Acute Institutional Stay (SKILLED_NURSING_FACILITY): Payer: Medicare Other | Admitting: Internal Medicine

## 2016-07-03 DIAGNOSIS — R531 Weakness: Secondary | ICD-10-CM | POA: Diagnosis not present

## 2016-07-03 DIAGNOSIS — K5903 Drug induced constipation: Secondary | ICD-10-CM | POA: Diagnosis not present

## 2016-07-03 DIAGNOSIS — R627 Adult failure to thrive: Secondary | ICD-10-CM | POA: Insufficient documentation

## 2016-07-03 DIAGNOSIS — L98499 Non-pressure chronic ulcer of skin of other sites with unspecified severity: Secondary | ICD-10-CM | POA: Diagnosis not present

## 2016-07-03 DIAGNOSIS — E890 Postprocedural hypothyroidism: Secondary | ICD-10-CM | POA: Diagnosis not present

## 2016-07-03 DIAGNOSIS — M4802 Spinal stenosis, cervical region: Secondary | ICD-10-CM | POA: Diagnosis not present

## 2016-07-03 NOTE — Assessment & Plan Note (Signed)
Adjust medications to improve mental status Palliative care involvement

## 2016-07-03 NOTE — Assessment & Plan Note (Signed)
Wound care protocol

## 2016-07-03 NOTE — Assessment & Plan Note (Signed)
PT/OT as tolerated 

## 2016-07-03 NOTE — Assessment & Plan Note (Signed)
TSH is being suppressed because of the history of thyroid cancer

## 2016-07-03 NOTE — Patient Instructions (Signed)
See Current Assessment & Plan in Problem List under specific Diagnosis 

## 2016-07-03 NOTE — Assessment & Plan Note (Signed)
Adjust narcotics as possible

## 2016-07-03 NOTE — Assessment & Plan Note (Signed)
Cervical collar when out of bed

## 2016-07-03 NOTE — Progress Notes (Signed)
Facility Location: [facility]  Room Number: 210  Code Status: DNR  PCP: Merrilee Seashore, [place] [address]  Note: patient states Burnard Bunting MD, [place], is PCP  This is a comprehensive admission note to [facility] performed on this date less than 30 days from date of admission. Included are preadmission medical/surgical history;reconciled medication list; family history; social history and comprehensive review of systems.  Corrections and additions to the records were documented . Comprehensive physical exam was also performed. Additionally a clinical summary was entered for each active diagnosis pertinent to this admission in the Problem List to enhance continuity of care.  HPI: The patient was rehospitalized 10/27-10/30/17 for failure to thrive in context of significant nonsurgical cervical stenosis with chronic decubitus ulcer,  dehydration, and worsening neurologic symptoms. The patient had been bedridden for 4 days prior to readmission as she cannot move her right side. At the time of initial discharge she was placed in a cervical -collar as per neurosurgery. Repeat cervical spine CT 10/27 revealed no change in the type II chronic dens fracture ;also present were degenerative changes. The neurosurgeon felt that she was not a surgical candidate. Palliative care intervention was completed. SNF placement for rehabilitation was also recommended.  Past medical and surgical history: History includes hypertension, acute renal failure, and pressure injury of the skin. She has history of thyroidectomy in 1986 for cancer. TSH 0.025 on 06/22/16  Social history: Nonsmoker and nondrinker.  Family history: Noncontributory due to age. There has been  history of stroke in both parents however.  Review of systems: She has a chronic, intermittent right-sided headache.  She states that she developed an  ulcer of the buttocks area while hospitalized last year. Major complaints at this time include nausea and weakness with poor oral intake. She had been constipated until she received multiple laxatives. Now she is having bowel movements which are slightly loose but not diarrheal. She's been unable to walk due to the weakness on the right.  Constitutional: No fever  Eyes: No redness, discharge, pain, vision change ENT/mouth: No nasal congestion,  purulent discharge, earache,change in hearing ,sore throat  Cardiovascular: No chest pain, palpitations,paroxysmal nocturnal dyspnea, claudication, edema  Respiratory: No cough, sputum production,hemoptysis, DOE , significant snoring,apnea   Gastrointestinal: No heartburn,dysphagia,abdominal pain,  vomiting,rectal bleeding, melena,change in bowels Genitourinary: No dysuria,hematuria, pyuria,  incontinence, nocturia Dermatologic: No rash, pruritus, change in appearance of skin other than ulcer Neurologic: No dizziness,syncope, seizures, numbness , tingling Endocrine: No change in hair/skin/ nails, excessive thirst, excessive hunger, excessive urination  Hematologic/lymphatic: No significant bruising, lymphadenopathy,abnormal bleeding Allergy/immunology: No itchy/ watery eyes, significant sneezing, urticaria, angioedema  Physical exam:  Pertinent or positive findings: She is profoundly lethargic, drifting off to sleep repeatedly. For most of the interview her eyes were closed. She has a small eschar over the upper lip. There is minimal range of motion in the right leg & essentially none in the right arm. Edema of the right arm is present. Pedal pulses are decreased slightly. Trace edema is noted in the ankles. With stimulation the right toe was upgoing.  General appearance:Adequately nourished; no acute distress , increased work of breathing is present.   Lymphatic: No lymphadenopathy about the head, neck, axilla . Eyes: No conjunctival inflammation  or lid edema is present. There is no scleral icterus. Ears:  External ear exam shows no significant lesions or deformities.   Nose:  External nasal examination shows no deformity  or inflammation. Nasal mucosa are pink and moist without lesions ,exudates Oral exam: lips and gums are healthy appearing.There is no oropharyngeal erythema or exudate . Neck:  No thyromegaly, masses, tenderness noted.    Heart:  Normal rate and regular rhythm. S1 and S2 normal without gallop, murmur, click, rub .  Lungs:Chest clear to auscultation without wheezes, rhonchi,rales , rubs. Breath sounds are decreased anteriorly. Abdomen:Bowel sounds are normal. Abdomen is soft and nontender with no organomegaly, hernias,masses. GU: deferred  Extremities:  No cyanosis, clubbing  Neurologic exam : Balance,Rhomberg,finger to nose testing could not be completed due to clinical state Deep tendon reflexes are absent on the right Skin: Warm & dry w/o tenting. No significant rash.  See clinical summary under each active problem in the Problem List with associated updated therapeutic plan

## 2016-07-05 DIAGNOSIS — L89150 Pressure ulcer of sacral region, unstageable: Secondary | ICD-10-CM | POA: Diagnosis not present

## 2016-07-09 ENCOUNTER — Encounter: Payer: Self-pay | Admitting: Nurse Practitioner

## 2016-07-09 ENCOUNTER — Non-Acute Institutional Stay (SKILLED_NURSING_FACILITY): Payer: Medicare Other | Admitting: Nurse Practitioner

## 2016-07-09 DIAGNOSIS — F411 Generalized anxiety disorder: Secondary | ICD-10-CM | POA: Diagnosis not present

## 2016-07-09 DIAGNOSIS — M4802 Spinal stenosis, cervical region: Secondary | ICD-10-CM | POA: Diagnosis not present

## 2016-07-09 NOTE — Progress Notes (Signed)
Nursing Home Location:  [facility]   Place of Service: SNF (31)  PCP: Merrilee Seashore, MD  Allergies  Allergen Reactions  . Demerol Shortness Of Breath  . Penicillins Swelling    "in the face" Has patient had a PCN reaction causing immediate rash, facial/tongue/throat swelling, SOB or lightheadedness with hypotension: yes Has patient had a PCN reaction causing severe rash involving mucus membranes or skin necrosis: no Has patient had a PCN reaction that required hospitalization: no  Has patient had a PCN reaction occurring within the last 10 years: no If all of the above answers are "NO", then may proceed with Cephalosporin use.   . Corticosteroids Other (See Comments)    DIZZINESS, HEADACHE  . Lorazepam Anxiety  . Tetracyclines & Related Rash  . Vancomycin Rash    Chief Complaint  Patient presents with  . Acute Visit    Pain medication clarification    HPI:  Patient is a 82 y.o. female seen today at [facility] due to clarification of pain medication. Pt with hx of significant cervical stenosis not a surgical candidate and decub ulcer. She has been on chronic pain medication however was very lethargic on admission and medication has been adjusted to improve mental status.  OxyContin was decreased to 5 mg q 12 however smallest dose is 10 mg and due to being extended release can no he halved. Pt has not had medication since 10/31. Pt conts on oxycodone 5 mg q 6 hours PRN.  Pt reports she has chronic pain in lower back. Throbbing. 7/10. Also pain to right leg. Oxy IR helps bring the pain down to a more manageable level but she is watching the clock and asking for this routinely.   Review of Systems:  Review of Systems  Constitutional: Positive for appetite change (poor appetite). Negative for activity change, fatigue and unexpected weight change.  HENT: Negative for congestion and hearing loss.   Eyes: Negative.   Respiratory: Negative for cough and  shortness of breath.   Cardiovascular: Negative for chest pain, palpitations and leg swelling.  Gastrointestinal: Negative for abdominal pain, constipation and diarrhea.  Genitourinary: Negative for difficulty urinating and dysuria.  Musculoskeletal: Positive for arthralgias, back pain, gait problem and myalgias.  Skin: Positive for wound. Negative for color change.  Neurological: Negative for dizziness and weakness.  Psychiatric/Behavioral: Negative for agitation, behavioral problems and confusion. The patient is nervous/anxious.     Past Medical History:  Diagnosis Date  . Anemia   . Angina   . Arthritis   . Blood transfusion   . Chronic back pain   . Exertional dyspnea 01/17/12  . Fracture 2008   back  . GERD (gastroesophageal reflux disease)   . Glaucoma   . H/O hiatal hernia   . Headache(784.0)   . Hypertension   . Hypoglycemia   . Hypothyroidism   . Macular degeneration of both eyes   . Metabolic acidemia 123456  . Osteomyelitis ([place]) 11/2011   right knee  . Pneumonia 01/2010  . Shortness of breath 11/08/11   "here lately, all the time"  . Thyroid cancer ([place]) 1986   Past Surgical History:  Procedure Laterality Date  . ABDOMINAL HYSTERECTOMY  1963  . APPENDECTOMY    . BACK SURGERY     "3 back OR's;"; S/P "fractured back 2008"  . CARPAL TUNNEL RELEASE     bilaterally  . CATARACT EXTRACTION W/ INTRAOCULAR LENS  IMPLANT, BILATERAL    . DILATION AND CURETTAGE OF  UTERUS    . FRACTURE SURGERY     "broke back"  . goiter removed  1980's   "when I was young"  . hand OR  09/2011   "left hand; bones were coming together"  . HIP ARTHROPLASTY Right 01/23/2016   Procedure: ARTHROPLASTY BIPOLAR HIP (HEMIARTHROPLASTY);  Surgeon: Gaynelle Arabian, MD;  Location: [facility];  Service: Orthopedics;  Laterality: Right;  Depuy  . I&D KNEE WITH POLY EXCHANGE  11/10/2011   Procedure: IRRIGATION AND DEBRIDEMENT KNEE WITH POLY EXCHANGE;  Surgeon: Yvette Rack., MD;  Location: [place];   Service: Orthopedics;  Laterality: Right;  . JOINT REPLACEMENT  2005, 2006, 2012, 11/10/11   left; left; right; right; right  . PERIPHERALLY INSERTED CENTRAL CATHETER INSERTION  11/2011   "removed 12/2011; LUE"  . post polyethelene exchange  11/09/11   RLE  . SHOULDER ARTHROSCOPY W/ ROTATOR CUFF REPAIR    . THYROIDECTOMY  1986   Cancer  . TONSILLECTOMY AND ADENOIDECTOMY    . TOTAL KNEE REVISION  04/28/2012   Procedure: TOTAL KNEE REVISION;  Surgeon: Rudean Haskell, MD;  Location: [place];  Service: Orthopedics;  Laterality: Right;  . TRIGGER FINGER RELEASE     right  thumb   Social History:   reports that she has never smoked. She has never used smokeless tobacco. She reports that she does not drink alcohol or use drugs.  Family History  Problem Relation Age of Onset  . Intracerebral hemorrhage Mother   . Stroke Father   . Parkinson's disease Brother     Medications: Patient's Medications  New Prescriptions   No medications on file  Previous Medications   ACETAMINOPHEN (TYLENOL) 500 MG TABLET    Take 500 mg by mouth every 4 (four) hours as needed for moderate pain.   BRIMONIDINE (ALPHAGAN) 0.2 % OPHTHALMIC SOLUTION    Place 1 drop into both eyes 2 (two) times daily.   DORZOLAMIDE-TIMOLOL (COSOPT) 22.3-6.8 MG/ML OPHTHALMIC SOLUTION    Place 1 drop into both eyes 2 (two) times daily.   GABAPENTIN (NEURONTIN) 100 MG CAPSULE    Take 100 mg by mouth 3 (three) times daily.   LATANOPROST (XALATAN) 0.005 % OPHTHALMIC SOLUTION    Place 1 drop into both eyes 2 (two) times daily.    LEVOTHYROXINE (SYNTHROID, LEVOTHROID) 125 MCG TABLET    Take 1 tablet (125 mcg total) by mouth daily before breakfast.   MELOXICAM (MOBIC) 7.5 MG TABLET    Take 7.5 mg by mouth 2 (two) times daily as needed for pain.   ONDANSETRON (ZOFRAN) 4 MG TABLET    Take 4 mg by mouth every 4 to 6 hours as needed for nausea.   OXYCODONE (OXY IR/ROXICODONE) 5 MG IMMEDIATE RELEASE TABLET    Take 5 mg by mouth every 6 (six) hours as  needed for moderate pain or severe pain.   POLYETHYLENE GLYCOL (MIRALAX / GLYCOLAX) PACKET    Take 17 g by mouth daily.   PROPRANOLOL (INDERAL) 20 MG TABLET    Take 20 mg by mouth daily. Takes for migraines.   TRAZODONE (DESYREL) 50 MG TABLET    Take 50 mg by mouth at bedtime.  Modified Medications   No medications on file  Discontinued Medications   IBUPROFEN (ADVIL,MOTRIN) 200 MG TABLET    Take 200 mg by mouth every 4 (four) hours as needed for moderate pain.   OXYCODONE (OXYCONTIN) 10 MG 12 HR TABLET    Take 1 tablet (10 mg total) by  mouth every 12 (twelve) hours.     Physical Exam: Vitals:   07/09/16 1523  BP: 124/66  Pulse: 67  Resp: 20  Temp: 97.2 F (36.2 C)  SpO2: 97%  Weight: 136 lb (61.7 kg)  Height: 5\' 1"  (1.549 m)    Physical Exam  Constitutional: She is oriented to person, place, and time. She appears well-developed and well-nourished. No distress.  HENT:  Head: Normocephalic and atraumatic.  Mouth/Throat: Oropharynx is clear and moist. No oropharyngeal exudate.  Eyes: Conjunctivae are normal. Pupils are equal, round, and reactive to light.  Neck: Normal range of motion. Neck supple.  Cardiovascular: Normal rate, regular rhythm and normal heart sounds.   Pulmonary/Chest: Effort normal and breath sounds normal.  Abdominal: Soft. Bowel sounds are normal.  Musculoskeletal: She exhibits no edema or tenderness.  Neurological: She is alert and oriented to person, place, and time.  Right sided hemiparesis   Skin: Skin is warm and dry. She is not diaphoretic.  Psychiatric: She has a normal mood and affect.    Labs reviewed: Basic Metabolic Panel:  Recent Labs  01/22/16 0436  06/29/16 1809 06/30/16 0421 07/02/16 0425  NA 135  < > 123* 128* 133*  K 3.5  < > 5.6* 4.1 4.6  CL 100*  < > 88* 99* 103  CO2 28  < > 23 21* 24  GLUCOSE 115*  < > 121* 155* 91  BUN 16  < > 129* 98* 48*  CREATININE 0.88  < > 1.71* 1.08* 0.69  CALCIUM 7.9*  < > 8.6* 7.7* 8.2*  MG 2.2   --   --   --   --   < > = values in this interval not displayed. Liver Function Tests:  Recent Labs  06/19/16 2201 06/29/16 1809  AST 16 26  ALT 12* 17  ALKPHOS 74 78  BILITOT 0.8 1.0  PROT 5.8* 6.5  ALBUMIN 3.7 3.5    Recent Labs  06/29/16 1809  LIPASE 20   No results for input(s): AMMONIA in the last 8760 hours. CBC:  Recent Labs  01/21/16 1829  06/19/16 2201  06/29/16 1809 06/30/16 0421 07/02/16 0425  WBC 13.9*  < > 5.1  < > 19.3* 15.5* 13.1*  NEUTROABS 12.3*  --  3.8  --  16.7*  --   --   HGB 12.1  < > 10.6*  < > 12.7 9.9* 11.3*  HCT 37.5  < > 32.6*  < > 37.6 29.3* 35.9*  MCV 93.1  < > 94.2  < > 92.4 92.7 97.8  PLT 288  < > 227  < > 385 336 421*  < > = values in this interval not displayed. TSH:  Recent Labs  01/22/16 0436 06/22/16 1818  TSH 0.140* 0.025*   A1C: No results found for: HGBA1C Lipid Panel: No results for input(s): CHOL, HDL, LDLCALC, TRIG, CHOLHDL, LDLDIRECT in the last 8760 hours.    Assessment/Plan 1. Spinal stenosis in cervical region Pt with  Chronic pain. She has not been receiving oxyContin ER in 6 days. Will DC at this time. Pt has PRN meloxicam 7.5 BID but not receiving this. Will schedule at this time to help with pain.  Will DC ibuprofen due to scheduled meloxicam Will get palliative care consult to help with pain management and goals of care  2. Anxiety state Will start cymbalta 30 mg daily to help with anxiety and pain regimen     Lindsie Simar K. Harle Battiest  [employer]  [facility] [phone] 8 am - 5 pm) [phone] (after hours)

## 2016-07-12 DIAGNOSIS — L89150 Pressure ulcer of sacral region, unstageable: Secondary | ICD-10-CM | POA: Diagnosis not present

## 2016-07-17 ENCOUNTER — Non-Acute Institutional Stay (SKILLED_NURSING_FACILITY): Payer: Medicare Other | Admitting: Internal Medicine

## 2016-07-17 ENCOUNTER — Encounter: Payer: Self-pay | Admitting: Internal Medicine

## 2016-07-17 DIAGNOSIS — Z9189 Other specified personal risk factors, not elsewhere classified: Secondary | ICD-10-CM

## 2016-07-17 DIAGNOSIS — R627 Adult failure to thrive: Secondary | ICD-10-CM | POA: Diagnosis not present

## 2016-07-17 DIAGNOSIS — G2581 Restless legs syndrome: Secondary | ICD-10-CM

## 2016-07-17 DIAGNOSIS — M4802 Spinal stenosis, cervical region: Secondary | ICD-10-CM | POA: Diagnosis not present

## 2016-07-17 NOTE — Assessment & Plan Note (Signed)
Palliative and Hospice will consult to determine level of care. She has no support at home. It is unclear whether she has a life expectancy less than 6 months. Greatest immediate risk to her health is adverse reaction to the opioids. This was explained to her

## 2016-07-17 NOTE — Progress Notes (Signed)
    Facility Location: [facility]  Room Number: 210  Code Status: DNR    This is a nursing facility follow up for specific acute issue of uncontrolled pain and restless leg symptoms.  Interim medical record and care since last [place] visit was updated with review of diagnostic studies and change in clinical status since last visit were documented.  HPI: When seen initially 07/03/16 at the time of admission to SNF she was profoundly lethargic in the setting of high doses of opioids for neuropathic pain related to her cervical stenosis. She was on OxyContin 10 mg extended release every 12 hours well as OxyCodone 5 mg release every 4 hours as needed. The OxyContin dose at that time was discontinued & oxycodone decreased to 5 mg 1-2 every 6 hours as needed. On November 6 ibuprofen was discontinued and Cymbalta initiated. The oxycodone immediate release was continued , but she states pain medication every 6 hours is not enough. She is now on the centrally acting agent duloxetine 30 mg daily as well as gabapentin for neuropathic pain. Additionally she takes meloxicam 7.5 mg twice a day as needed.  Review of systems: She describes severe constant pain over the entire head. She relates this to her previous fall and head injury. She also describes restlessness in her legs which prevents sleep. She states that gabapentin has been beneficial in the past. As noted she's on 100 mg every 8 hours. She wants to go home with Hospice. According to the nurses her husband has severe dementia. They live alone his dementia is exhibited by his inability to recognize the nurse even though he has seen her several times during that day.  Physical exam:  Pertinent or positive findings:She is dramatically much more alert than she was at the time of admission. She is oriented 3.  She is wearing a cervical collar. She has slight edema of the right upper extremity.   the right  upper extremity is weaker than the left. There is also weakness in the right lower extremity. She has well-healed operative scars over the knees. Pedal pulses are decreased.  General appearance:Adequately nourished; no acute distress , increased work of breathing is present.   Eyes: No conjunctival inflammation or lid edema is present. There is no scleral icterus. Ears:  External ear exam shows no significant lesions or deformities.   Nose:  External nasal examination shows no deformity or inflammation. Nasal mucosa are pink and moist without lesions ,exudates Oral exam: lips and gums are healthy appearing.There is no oropharyngeal erythema or exudate . Neck:  No thyromegaly, masses, tenderness noted.    Heart:  Normal rate and regular rhythm. S1 and S2 normal without gallop, murmur, click, rub .  Lungs:Chest clear to auscultation without wheezes, rhonchi,rales , rubs. Abdomen:Bowel sounds are normal. Abdomen is soft and nontender with no organomegaly, hernias,masses. GU: deferred  Extremities:  No cyanosis, clubbing,edema  Neurologic exam : Balance,Rhomberg,finger to nose testing could not be completed due to clinical state Deep tendon reflexes are slightly unequal Skin: Warm & dry w/o tenting. No significant lesions or rash.    See summary under each active problem in the Problem List with associated updated therapeutic plan

## 2016-07-17 NOTE — Patient Instructions (Addendum)
See Current Assessment & Plan in Problem List under specific Diagnosis Total time  36 minutes; greater than 50% of the visit spent counseling patient and coordinating care for problems addressed at this encounter. The pathophysiology of pain due to specific pain pathways and processes (Ex neuropathic vs musculoskeletal pain) with appropriate therapy for each was discussed. Euphoria due to stimulus of  Mu receptors and risk of altered mental status with falls and respiratory suppression related to Mu receptor stimulus was also discussed.

## 2016-07-17 NOTE — Assessment & Plan Note (Signed)
Titrate gabapentin

## 2016-07-17 NOTE — Assessment & Plan Note (Addendum)
Her neuropathic pain has been treated with opioids @ high doses with increased risk of adverse reactions including respiratory suppression (30% risk over ensuing 6 months) Gabapentin will be titrated Low-dose non extended narcotics will be employed prn

## 2016-07-18 DIAGNOSIS — R531 Weakness: Secondary | ICD-10-CM | POA: Diagnosis not present

## 2016-07-23 DIAGNOSIS — R531 Weakness: Secondary | ICD-10-CM | POA: Diagnosis not present

## 2016-07-24 ENCOUNTER — Non-Acute Institutional Stay (SKILLED_NURSING_FACILITY): Payer: Medicare Other | Admitting: Internal Medicine

## 2016-07-24 ENCOUNTER — Encounter: Payer: Self-pay | Admitting: Internal Medicine

## 2016-07-24 ENCOUNTER — Encounter: Payer: Self-pay | Admitting: *Deleted

## 2016-07-24 DIAGNOSIS — Z9189 Other specified personal risk factors, not elsewhere classified: Secondary | ICD-10-CM | POA: Diagnosis not present

## 2016-07-24 DIAGNOSIS — R627 Adult failure to thrive: Secondary | ICD-10-CM | POA: Diagnosis not present

## 2016-07-24 DIAGNOSIS — L89150 Pressure ulcer of sacral region, unstageable: Secondary | ICD-10-CM | POA: Diagnosis not present

## 2016-07-24 DIAGNOSIS — M4802 Spinal stenosis, cervical region: Secondary | ICD-10-CM | POA: Diagnosis not present

## 2016-07-24 NOTE — Assessment & Plan Note (Signed)
Narcotic medications have been minimized, replaced by gabapentin Duloxetine, and meloxicam on as-needed basis.

## 2016-07-24 NOTE — Assessment & Plan Note (Signed)
Hospice will help her husband provide supportive care in the home.

## 2016-07-24 NOTE — Patient Instructions (Signed)
Prescriptions were written for Duloxetine, gabapentin, and meloxicam. A limited number of hydrocodone 5 mg, #30, one every 6 hours as needed was written. Pathophysiology of neuropathic pain versus musculoskeletal pain was discussed as well as the risks of the narcotic

## 2016-07-24 NOTE — Progress Notes (Signed)
Nursing Home Location: [facility] Room Number: 210  She states her doctor is  Nada Libman, M.D.  Code Status: DNR  The patient is being discharged from [facility]/25/16 by Unice Cobble MD.  The medical history in this facility was reviewed and summarized and medical problem list was updated. Time spent and note content is documented as follows.  Summary of [place] medical records: The patient has severe cervical stenosis with severe radicular pain and right-sided weakness.This is a nonsurgical situation according to her Neurosurgeon. She was hospitalized 10/27-10/30/17 with adult failure to thrive complicated by decubitus ulcer.   She has chronic dens fracture type II with degenerative cervical disc disease. Palliative care and SNF placement were recommended. When initially seen 10/30 she was profoundly lethargic during the interview. At that time she did have minimal range of motion of the right leg & essentially none of the right upper extremity. The high-dose narcotic medications were subsequently weaned. The risk of at least 30%  for narcotic-related complication or respiratory suppression was discussed with her frankly.  Review of systems:Pertinent or active symptoms include: She has residual weakness on the right but there is been dramatic improvement during the hospitalization he/OT. She denies any active complications from the decubitus ulcer.  Negative [id number]: No fever,significant weight change, fatigue  Eyes: No redness, discharge, pain, vision change ENT/mouth: No nasal congestion,  purulent discharge, earache,change in hearing ,sore throat  Cardiovascular: No chest pain, palpitations,paroxysmal nocturnal dyspnea, claudication, edema  Respiratory: No cough, sputum production,hemoptysis, DOE , significant snoring,apnea  Gastrointestinal: No heartburn,dysphagia,abdominal pain, nausea /  vomiting,rectal bleeding, melena,change in bowels Genitourinary: No dysuria,hematuria, pyuria,  incontinence, nocturia Dermatologic: No rash, pruritus, change in appearance of skin other than decubit Neurologic: No dizziness,headache,syncope, seizures,  Psychiatric: No significant anxiety , depression, insomnia, anorexia Endocrine: No change in hair/skin/ nails, excessive thirst, excessive hunger, excessive urination  Hematologic/lymphatic: No significant bruising, lymphadenopathy,abnormal bleeding   Physical exam:  Pertinent or positive findings:She is bright , alert, communicative and oriented. She is wearing a cervical collar. Posterior tibial pulses are decreased. The right upper extremity and right lower extremity are weak,more so in the lower extremity. The range of motion of the upper lower extremity has improved dramatically compared to date of admission.right biceps reflex is 1.5+, left 1+   Foley catheter in place   General appearance:Adequately nourished; no acute distress , increased work of breathing is present.   Lymphatic: No lymphadenopathy about the head, neck, axilla . Eyes: No conjunctival inflammation or lid edema is present. There is no scleral icterus. Ears:  External ear exam shows no significant lesions or deformities.   Nose:  External nasal examination shows no deformity or inflammation. Nasal mucosa are pink and moist without lesions ,exudates Oral exam: lips and gums are healthy appearing.There is no oropharyngeal erythema or exudate . Neck:  No thyromegaly, masses, tenderness noted.    Heart:  Normal rate and regular rhythm. S1 and S2 normal without gallop, murmur, click, rub .  Lungs:Chest clear to auscultation without wheezes, rhonchi,rales , rubs. Abdomen:Bowel sounds are normal. Abdomen is soft and nontender with no organomegaly, hernias,masses. GU: deferred . Extremities:  No cyanosis, clubbing,edema  Neurologic exam : Balance,Rhomberg,finger to nose  testing could not be completed due to clinical state Skin: Warm & dry w/o tenting. No significant  rash.  See clinical summary of Discharge Diagnoses in the Problem List with associated updated therapeutic plan  Discharge instructions were written and discharge  instructions provided. Follow-up will be by the primary care physician in seven - 14 days.

## 2016-07-24 NOTE — Assessment & Plan Note (Signed)
Indication for gabapentin, duloxetine, and meloxicam as needed in lieu of narcotics was discussed 07/24/16 Also narcotics would increase her risk for recurrent falls with risk of recurrent fractures and subdural hematoma

## 2016-07-24 NOTE — Patient Outreach (Signed)
[name] [facility]) Care Management Post-Acute Care Coordination  07/24/2016  [name] 1925/04/23 [id number]   Met with Cameron Ali, LCSW for [place]. Reviewed patient case. She states that patient is being followed by palliative care and there is discussion around patient going home with Hospice in the next 1-2 weeks.   RNCM will sign off case, if patient discharge plans change, RNCM will review for [facility] program services. LCSW aware to let RNCM know of discharge plan changes.  Royetta Crochet. Laymond Purser, RN, BSN, [place] Post-Acute Care Coordinator [phone]

## 2016-07-28 DIAGNOSIS — R531 Weakness: Secondary | ICD-10-CM | POA: Diagnosis not present

## 2016-07-28 DIAGNOSIS — R404 Transient alteration of awareness: Secondary | ICD-10-CM | POA: Diagnosis not present

## 2016-07-29 DIAGNOSIS — K2970 Gastritis, unspecified, without bleeding: Secondary | ICD-10-CM | POA: Diagnosis not present

## 2016-07-29 DIAGNOSIS — R110 Nausea: Secondary | ICD-10-CM | POA: Diagnosis not present

## 2016-08-13 ENCOUNTER — Encounter: Payer: Self-pay | Admitting: Internal Medicine

## 2016-08-13 DIAGNOSIS — F418 Other specified anxiety disorders: Secondary | ICD-10-CM | POA: Insufficient documentation

## 2016-09-03 DIAGNOSIS — M4802 Spinal stenosis, cervical region: Secondary | ICD-10-CM | POA: Diagnosis not present

## 2016-09-03 DIAGNOSIS — S14109S Unspecified injury at unspecified level of cervical spinal cord, sequela: Secondary | ICD-10-CM | POA: Diagnosis not present

## 2016-09-03 DIAGNOSIS — N189 Chronic kidney disease, unspecified: Secondary | ICD-10-CM | POA: Diagnosis not present

## 2016-09-03 DIAGNOSIS — R1310 Dysphagia, unspecified: Secondary | ICD-10-CM | POA: Diagnosis not present

## 2016-09-03 DIAGNOSIS — I10 Essential (primary) hypertension: Secondary | ICD-10-CM | POA: Diagnosis not present

## 2016-09-03 DIAGNOSIS — L89159 Pressure ulcer of sacral region, unspecified stage: Secondary | ICD-10-CM | POA: Diagnosis not present

## 2016-09-04 DIAGNOSIS — R1310 Dysphagia, unspecified: Secondary | ICD-10-CM | POA: Diagnosis not present

## 2016-09-04 DIAGNOSIS — L89159 Pressure ulcer of sacral region, unspecified stage: Secondary | ICD-10-CM | POA: Diagnosis not present

## 2016-09-04 DIAGNOSIS — I10 Essential (primary) hypertension: Secondary | ICD-10-CM | POA: Diagnosis not present

## 2016-09-04 DIAGNOSIS — M4802 Spinal stenosis, cervical region: Secondary | ICD-10-CM | POA: Diagnosis not present

## 2016-09-04 DIAGNOSIS — S14109S Unspecified injury at unspecified level of cervical spinal cord, sequela: Secondary | ICD-10-CM | POA: Diagnosis not present

## 2016-09-04 DIAGNOSIS — N189 Chronic kidney disease, unspecified: Secondary | ICD-10-CM | POA: Diagnosis not present

## 2016-09-05 DIAGNOSIS — S14109S Unspecified injury at unspecified level of cervical spinal cord, sequela: Secondary | ICD-10-CM | POA: Diagnosis not present

## 2016-09-05 DIAGNOSIS — N189 Chronic kidney disease, unspecified: Secondary | ICD-10-CM | POA: Diagnosis not present

## 2016-09-05 DIAGNOSIS — I10 Essential (primary) hypertension: Secondary | ICD-10-CM | POA: Diagnosis not present

## 2016-09-05 DIAGNOSIS — M4802 Spinal stenosis, cervical region: Secondary | ICD-10-CM | POA: Diagnosis not present

## 2016-09-05 DIAGNOSIS — L89159 Pressure ulcer of sacral region, unspecified stage: Secondary | ICD-10-CM | POA: Diagnosis not present

## 2016-09-05 DIAGNOSIS — R1310 Dysphagia, unspecified: Secondary | ICD-10-CM | POA: Diagnosis not present

## 2016-09-06 DIAGNOSIS — S14109S Unspecified injury at unspecified level of cervical spinal cord, sequela: Secondary | ICD-10-CM | POA: Diagnosis not present

## 2016-09-06 DIAGNOSIS — M4802 Spinal stenosis, cervical region: Secondary | ICD-10-CM | POA: Diagnosis not present

## 2016-09-06 DIAGNOSIS — I10 Essential (primary) hypertension: Secondary | ICD-10-CM | POA: Diagnosis not present

## 2016-09-06 DIAGNOSIS — R1310 Dysphagia, unspecified: Secondary | ICD-10-CM | POA: Diagnosis not present

## 2016-09-06 DIAGNOSIS — L89159 Pressure ulcer of sacral region, unspecified stage: Secondary | ICD-10-CM | POA: Diagnosis not present

## 2016-09-06 DIAGNOSIS — N189 Chronic kidney disease, unspecified: Secondary | ICD-10-CM | POA: Diagnosis not present

## 2016-09-07 DIAGNOSIS — S14109S Unspecified injury at unspecified level of cervical spinal cord, sequela: Secondary | ICD-10-CM | POA: Diagnosis not present

## 2016-09-07 DIAGNOSIS — R1310 Dysphagia, unspecified: Secondary | ICD-10-CM | POA: Diagnosis not present

## 2016-09-07 DIAGNOSIS — I10 Essential (primary) hypertension: Secondary | ICD-10-CM | POA: Diagnosis not present

## 2016-09-07 DIAGNOSIS — L89159 Pressure ulcer of sacral region, unspecified stage: Secondary | ICD-10-CM | POA: Diagnosis not present

## 2016-09-07 DIAGNOSIS — N189 Chronic kidney disease, unspecified: Secondary | ICD-10-CM | POA: Diagnosis not present

## 2016-09-07 DIAGNOSIS — M4802 Spinal stenosis, cervical region: Secondary | ICD-10-CM | POA: Diagnosis not present

## 2016-09-08 DIAGNOSIS — N189 Chronic kidney disease, unspecified: Secondary | ICD-10-CM | POA: Diagnosis not present

## 2016-09-08 DIAGNOSIS — I10 Essential (primary) hypertension: Secondary | ICD-10-CM | POA: Diagnosis not present

## 2016-09-08 DIAGNOSIS — R1310 Dysphagia, unspecified: Secondary | ICD-10-CM | POA: Diagnosis not present

## 2016-09-08 DIAGNOSIS — M4802 Spinal stenosis, cervical region: Secondary | ICD-10-CM | POA: Diagnosis not present

## 2016-09-08 DIAGNOSIS — S14109S Unspecified injury at unspecified level of cervical spinal cord, sequela: Secondary | ICD-10-CM | POA: Diagnosis not present

## 2016-09-08 DIAGNOSIS — L89159 Pressure ulcer of sacral region, unspecified stage: Secondary | ICD-10-CM | POA: Diagnosis not present

## 2016-09-09 DIAGNOSIS — L89159 Pressure ulcer of sacral region, unspecified stage: Secondary | ICD-10-CM | POA: Diagnosis not present

## 2016-09-09 DIAGNOSIS — S14109S Unspecified injury at unspecified level of cervical spinal cord, sequela: Secondary | ICD-10-CM | POA: Diagnosis not present

## 2016-09-09 DIAGNOSIS — M4802 Spinal stenosis, cervical region: Secondary | ICD-10-CM | POA: Diagnosis not present

## 2016-09-09 DIAGNOSIS — R1310 Dysphagia, unspecified: Secondary | ICD-10-CM | POA: Diagnosis not present

## 2016-09-09 DIAGNOSIS — I10 Essential (primary) hypertension: Secondary | ICD-10-CM | POA: Diagnosis not present

## 2016-09-09 DIAGNOSIS — N189 Chronic kidney disease, unspecified: Secondary | ICD-10-CM | POA: Diagnosis not present

## 2016-09-10 DIAGNOSIS — R1310 Dysphagia, unspecified: Secondary | ICD-10-CM | POA: Diagnosis not present

## 2016-09-10 DIAGNOSIS — N189 Chronic kidney disease, unspecified: Secondary | ICD-10-CM | POA: Diagnosis not present

## 2016-09-10 DIAGNOSIS — M4802 Spinal stenosis, cervical region: Secondary | ICD-10-CM | POA: Diagnosis not present

## 2016-09-10 DIAGNOSIS — S14109S Unspecified injury at unspecified level of cervical spinal cord, sequela: Secondary | ICD-10-CM | POA: Diagnosis not present

## 2016-09-10 DIAGNOSIS — I10 Essential (primary) hypertension: Secondary | ICD-10-CM | POA: Diagnosis not present

## 2016-09-10 DIAGNOSIS — L89159 Pressure ulcer of sacral region, unspecified stage: Secondary | ICD-10-CM | POA: Diagnosis not present

## 2016-09-11 DIAGNOSIS — R1310 Dysphagia, unspecified: Secondary | ICD-10-CM | POA: Diagnosis not present

## 2016-09-11 DIAGNOSIS — I10 Essential (primary) hypertension: Secondary | ICD-10-CM | POA: Diagnosis not present

## 2016-09-11 DIAGNOSIS — L89159 Pressure ulcer of sacral region, unspecified stage: Secondary | ICD-10-CM | POA: Diagnosis not present

## 2016-09-11 DIAGNOSIS — M4802 Spinal stenosis, cervical region: Secondary | ICD-10-CM | POA: Diagnosis not present

## 2016-09-11 DIAGNOSIS — S14109S Unspecified injury at unspecified level of cervical spinal cord, sequela: Secondary | ICD-10-CM | POA: Diagnosis not present

## 2016-09-11 DIAGNOSIS — N189 Chronic kidney disease, unspecified: Secondary | ICD-10-CM | POA: Diagnosis not present

## 2016-09-12 DIAGNOSIS — S14109S Unspecified injury at unspecified level of cervical spinal cord, sequela: Secondary | ICD-10-CM | POA: Diagnosis not present

## 2016-09-12 DIAGNOSIS — M4802 Spinal stenosis, cervical region: Secondary | ICD-10-CM | POA: Diagnosis not present

## 2016-09-12 DIAGNOSIS — I10 Essential (primary) hypertension: Secondary | ICD-10-CM | POA: Diagnosis not present

## 2016-09-12 DIAGNOSIS — L89159 Pressure ulcer of sacral region, unspecified stage: Secondary | ICD-10-CM | POA: Diagnosis not present

## 2016-09-12 DIAGNOSIS — R1310 Dysphagia, unspecified: Secondary | ICD-10-CM | POA: Diagnosis not present

## 2016-09-12 DIAGNOSIS — N189 Chronic kidney disease, unspecified: Secondary | ICD-10-CM | POA: Diagnosis not present

## 2016-09-13 DIAGNOSIS — R1310 Dysphagia, unspecified: Secondary | ICD-10-CM | POA: Diagnosis not present

## 2016-09-13 DIAGNOSIS — L89159 Pressure ulcer of sacral region, unspecified stage: Secondary | ICD-10-CM | POA: Diagnosis not present

## 2016-09-13 DIAGNOSIS — S14109S Unspecified injury at unspecified level of cervical spinal cord, sequela: Secondary | ICD-10-CM | POA: Diagnosis not present

## 2016-09-13 DIAGNOSIS — I10 Essential (primary) hypertension: Secondary | ICD-10-CM | POA: Diagnosis not present

## 2016-09-13 DIAGNOSIS — M4802 Spinal stenosis, cervical region: Secondary | ICD-10-CM | POA: Diagnosis not present

## 2016-09-13 DIAGNOSIS — N189 Chronic kidney disease, unspecified: Secondary | ICD-10-CM | POA: Diagnosis not present

## 2016-09-14 DIAGNOSIS — N189 Chronic kidney disease, unspecified: Secondary | ICD-10-CM | POA: Diagnosis not present

## 2016-09-14 DIAGNOSIS — R1310 Dysphagia, unspecified: Secondary | ICD-10-CM | POA: Diagnosis not present

## 2016-09-14 DIAGNOSIS — I10 Essential (primary) hypertension: Secondary | ICD-10-CM | POA: Diagnosis not present

## 2016-09-14 DIAGNOSIS — S14109S Unspecified injury at unspecified level of cervical spinal cord, sequela: Secondary | ICD-10-CM | POA: Diagnosis not present

## 2016-09-14 DIAGNOSIS — L89159 Pressure ulcer of sacral region, unspecified stage: Secondary | ICD-10-CM | POA: Diagnosis not present

## 2016-09-14 DIAGNOSIS — M4802 Spinal stenosis, cervical region: Secondary | ICD-10-CM | POA: Diagnosis not present

## 2016-09-15 DIAGNOSIS — R1310 Dysphagia, unspecified: Secondary | ICD-10-CM | POA: Diagnosis not present

## 2016-09-15 DIAGNOSIS — M4802 Spinal stenosis, cervical region: Secondary | ICD-10-CM | POA: Diagnosis not present

## 2016-09-15 DIAGNOSIS — S14109S Unspecified injury at unspecified level of cervical spinal cord, sequela: Secondary | ICD-10-CM | POA: Diagnosis not present

## 2016-09-15 DIAGNOSIS — N189 Chronic kidney disease, unspecified: Secondary | ICD-10-CM | POA: Diagnosis not present

## 2016-09-15 DIAGNOSIS — I10 Essential (primary) hypertension: Secondary | ICD-10-CM | POA: Diagnosis not present

## 2016-09-15 DIAGNOSIS — L89159 Pressure ulcer of sacral region, unspecified stage: Secondary | ICD-10-CM | POA: Diagnosis not present

## 2016-09-16 ENCOUNTER — Inpatient Hospital Stay (HOSPITAL_COMMUNITY)
Admission: EM | Admit: 2016-09-16 | Discharge: 2016-09-17 | DRG: 698 | Disposition: A | Discharge status: Hospice | Attending: Internal Medicine | Admitting: Internal Medicine

## 2016-09-16 ENCOUNTER — Encounter (HOSPITAL_COMMUNITY): Payer: Self-pay | Admitting: Internal Medicine

## 2016-09-16 ENCOUNTER — Emergency Department (HOSPITAL_COMMUNITY)

## 2016-09-16 DIAGNOSIS — T83511A Infection and inflammatory reaction due to indwelling urethral catheter, initial encounter: Secondary | ICD-10-CM | POA: Diagnosis present

## 2016-09-16 DIAGNOSIS — I129 Hypertensive chronic kidney disease with stage 1 through stage 4 chronic kidney disease, or unspecified chronic kidney disease: Secondary | ICD-10-CM | POA: Diagnosis present

## 2016-09-16 DIAGNOSIS — Z515 Encounter for palliative care: Secondary | ICD-10-CM | POA: Diagnosis present

## 2016-09-16 DIAGNOSIS — L89150 Pressure ulcer of sacral region, unstageable: Secondary | ICD-10-CM | POA: Diagnosis present

## 2016-09-16 DIAGNOSIS — R1310 Dysphagia, unspecified: Secondary | ICD-10-CM | POA: Diagnosis not present

## 2016-09-16 DIAGNOSIS — E890 Postprocedural hypothyroidism: Secondary | ICD-10-CM | POA: Diagnosis present

## 2016-09-16 DIAGNOSIS — N189 Chronic kidney disease, unspecified: Secondary | ICD-10-CM | POA: Diagnosis not present

## 2016-09-16 DIAGNOSIS — Z90710 Acquired absence of both cervix and uterus: Secondary | ICD-10-CM | POA: Diagnosis not present

## 2016-09-16 DIAGNOSIS — Y92009 Unspecified place in unspecified non-institutional (private) residence as the place of occurrence of the external cause: Secondary | ICD-10-CM | POA: Diagnosis not present

## 2016-09-16 DIAGNOSIS — L89613 Pressure ulcer of right heel, stage 3: Secondary | ICD-10-CM | POA: Diagnosis present

## 2016-09-16 DIAGNOSIS — Z96641 Presence of right artificial hip joint: Secondary | ICD-10-CM | POA: Diagnosis present

## 2016-09-16 DIAGNOSIS — I9589 Other hypotension: Secondary | ICD-10-CM | POA: Diagnosis not present

## 2016-09-16 DIAGNOSIS — Z9842 Cataract extraction status, left eye: Secondary | ICD-10-CM | POA: Diagnosis not present

## 2016-09-16 DIAGNOSIS — Z9841 Cataract extraction status, right eye: Secondary | ICD-10-CM | POA: Diagnosis not present

## 2016-09-16 DIAGNOSIS — Z85850 Personal history of malignant neoplasm of thyroid: Secondary | ICD-10-CM | POA: Diagnosis not present

## 2016-09-16 DIAGNOSIS — I959 Hypotension, unspecified: Secondary | ICD-10-CM

## 2016-09-16 DIAGNOSIS — N289 Disorder of kidney and ureter, unspecified: Secondary | ICD-10-CM

## 2016-09-16 DIAGNOSIS — M549 Dorsalgia, unspecified: Secondary | ICD-10-CM | POA: Diagnosis present

## 2016-09-16 DIAGNOSIS — G8929 Other chronic pain: Secondary | ICD-10-CM | POA: Diagnosis present

## 2016-09-16 DIAGNOSIS — I10 Essential (primary) hypertension: Secondary | ICD-10-CM | POA: Diagnosis not present

## 2016-09-16 DIAGNOSIS — N184 Chronic kidney disease, stage 4 (severe): Secondary | ICD-10-CM | POA: Diagnosis present

## 2016-09-16 DIAGNOSIS — R401 Stupor: Secondary | ICD-10-CM | POA: Diagnosis not present

## 2016-09-16 DIAGNOSIS — N179 Acute kidney failure, unspecified: Secondary | ICD-10-CM | POA: Diagnosis present

## 2016-09-16 DIAGNOSIS — R400 Somnolence: Secondary | ICD-10-CM | POA: Diagnosis not present

## 2016-09-16 DIAGNOSIS — Z961 Presence of intraocular lens: Secondary | ICD-10-CM | POA: Diagnosis present

## 2016-09-16 DIAGNOSIS — D631 Anemia in chronic kidney disease: Secondary | ICD-10-CM | POA: Diagnosis present

## 2016-09-16 DIAGNOSIS — L89159 Pressure ulcer of sacral region, unspecified stage: Secondary | ICD-10-CM | POA: Diagnosis not present

## 2016-09-16 DIAGNOSIS — E860 Dehydration: Secondary | ICD-10-CM | POA: Diagnosis present

## 2016-09-16 DIAGNOSIS — N390 Urinary tract infection, site not specified: Secondary | ICD-10-CM | POA: Diagnosis present

## 2016-09-16 DIAGNOSIS — R918 Other nonspecific abnormal finding of lung field: Secondary | ICD-10-CM | POA: Diagnosis not present

## 2016-09-16 DIAGNOSIS — Z66 Do not resuscitate: Secondary | ICD-10-CM | POA: Diagnosis present

## 2016-09-16 DIAGNOSIS — R402421 Glasgow coma scale score 9-12, in the field [EMT or ambulance]: Secondary | ICD-10-CM | POA: Diagnosis not present

## 2016-09-16 DIAGNOSIS — S14109S Unspecified injury at unspecified level of cervical spinal cord, sequela: Secondary | ICD-10-CM | POA: Diagnosis not present

## 2016-09-16 DIAGNOSIS — M4802 Spinal stenosis, cervical region: Secondary | ICD-10-CM | POA: Diagnosis not present

## 2016-09-16 LAB — LACTIC ACID, PLASMA
LACTIC ACID, VENOUS: 2.1 mmol/L — AB (ref 0.5–1.9)
Lactic Acid, Venous: 2.4 mmol/L (ref 0.5–1.9)

## 2016-09-16 LAB — URINALYSIS, ROUTINE W REFLEX MICROSCOPIC
Glucose, UA: NEGATIVE mg/dL
HGB URINE DIPSTICK: NEGATIVE
KETONES UR: NEGATIVE mg/dL
Nitrite: NEGATIVE
Specific Gravity, Urine: 1.018 (ref 1.005–1.030)
pH: 8 (ref 5.0–8.0)

## 2016-09-16 LAB — CBC WITH DIFFERENTIAL/PLATELET
Basophils Absolute: 0 10*3/uL (ref 0.0–0.1)
Basophils Relative: 0 %
EOS PCT: 0 %
Eosinophils Absolute: 0 10*3/uL (ref 0.0–0.7)
HCT: 35.9 % — ABNORMAL LOW (ref 36.0–46.0)
Hemoglobin: 10.9 g/dL — ABNORMAL LOW (ref 12.0–15.0)
LYMPHS ABS: 1.2 10*3/uL (ref 0.7–4.0)
LYMPHS PCT: 7 %
MCH: 29.7 pg (ref 26.0–34.0)
MCHC: 30.4 g/dL (ref 30.0–36.0)
MCV: 97.8 fL (ref 78.0–100.0)
Monocytes Absolute: 0.9 10*3/uL (ref 0.1–1.0)
Monocytes Relative: 5 %
Neutro Abs: 15.5 10*3/uL — ABNORMAL HIGH (ref 1.7–7.7)
Neutrophils Relative %: 88 %
PLATELETS: 350 10*3/uL (ref 150–400)
RBC: 3.67 MIL/uL — AB (ref 3.87–5.11)
RDW: 14.3 % (ref 11.5–15.5)
WBC: 17.6 10*3/uL — AB (ref 4.0–10.5)

## 2016-09-16 LAB — COMPREHENSIVE METABOLIC PANEL
ALBUMIN: 2.2 g/dL — AB (ref 3.5–5.0)
ALT: 15 U/L (ref 14–54)
AST: 18 U/L (ref 15–41)
Alkaline Phosphatase: 215 U/L — ABNORMAL HIGH (ref 38–126)
Anion gap: 10 (ref 5–15)
BUN: 59 mg/dL — AB (ref 6–20)
CHLORIDE: 101 mmol/L (ref 101–111)
CO2: 23 mmol/L (ref 22–32)
Calcium: 8.4 mg/dL — ABNORMAL LOW (ref 8.9–10.3)
Creatinine, Ser: 1.6 mg/dL — ABNORMAL HIGH (ref 0.44–1.00)
GFR calc Af Amer: 32 mL/min — ABNORMAL LOW (ref >60)
GFR calc non Af Amer: 27 mL/min — ABNORMAL LOW (ref >60)
GLUCOSE: 156 mg/dL — AB (ref 65–99)
POTASSIUM: 5 mmol/L (ref 3.5–5.1)
SODIUM: 134 mmol/L — AB (ref 135–145)
Total Bilirubin: 0.7 mg/dL (ref 0.3–1.2)
Total Protein: 5 g/dL — ABNORMAL LOW (ref 6.5–8.1)

## 2016-09-16 LAB — TSH: TSH: 15.719 u[IU]/mL — AB (ref 0.350–4.500)

## 2016-09-16 MED ORDER — LEVOFLOXACIN IN D5W 500 MG/100ML IV SOLN: 500.0000 mg | INTRAVENOUS | Status: DC

## 2016-09-16 MED ORDER — ALBUTEROL SULFATE (2.5 MG/3ML) 0.083% IN NEBU: 3.0000 mL | INHALATION_SOLUTION | RESPIRATORY_TRACT | Status: DC | PRN

## 2016-09-16 MED ORDER — MORPHINE SULFATE (PF) 4 MG/ML IV SOLN
2.0000 mg | Freq: Once | INTRAVENOUS | Status: AC
  Administered 2016-09-16: 2 mg via INTRAVENOUS
  Filled 2016-09-16: qty 1

## 2016-09-16 MED ORDER — LEVOTHYROXINE SODIUM 100 MCG PO TABS: 100.0000 ug | ORAL_TABLET | Freq: Every day | ORAL | Status: DC

## 2016-09-16 MED ORDER — SODIUM CHLORIDE 0.9 % IV BOLUS (SEPSIS)
1000.0000 mL | Freq: Once | INTRAVENOUS | Status: AC
  Administered 2016-09-16: 1000 mL via INTRAVENOUS

## 2016-09-16 MED ORDER — DEXTROSE-NACL 5-0.45 % IV SOLN
INTRAVENOUS | Status: DC
  Administered 2016-09-16 – 2016-09-17 (×2): via INTRAVENOUS

## 2016-09-16 MED ORDER — ONDANSETRON HCL 4 MG PO TABS: 4.0000 mg | ORAL_TABLET | Freq: Four times a day (QID) | ORAL | Status: DC | PRN

## 2016-09-16 MED ORDER — LEVOFLOXACIN IN D5W 750 MG/150ML IV SOLN
750.0000 mg | Freq: Once | INTRAVENOUS | Status: AC
  Administered 2016-09-16: 750 mg via INTRAVENOUS
  Filled 2016-09-16: qty 150

## 2016-09-16 MED ORDER — LEVOTHYROXINE SODIUM 100 MCG PO TABS
100.0000 ug | ORAL_TABLET | Freq: Every day | ORAL | Status: DC
  Administered 2016-09-17: 100 ug via ORAL
  Filled 2016-09-16: qty 1

## 2016-09-16 MED ORDER — ONDANSETRON HCL 4 MG/2ML IJ SOLN: 4.0000 mg | Freq: Four times a day (QID) | INTRAMUSCULAR | Status: DC | PRN

## 2016-09-16 MED ORDER — BISACODYL 10 MG RE SUPP: 10.0000 mg | Freq: Every day | RECTAL | Status: DC | PRN

## 2016-09-16 NOTE — Progress Notes (Signed)
Pharmacy Antibiotic Note  [name] is a 83 y.o. female admitted on 09/16/2016 with UTI.  Pharmacy has been consulted for levofloxacin dosing. Patient with elevated SCr on admission 1.60 (Baseline ~0.8), estimated CrCl ~20 ml/min.  Plan: - Levofloxacin 750 mg IV x1, followed by levofloxacin 500 mg IV q48h - Monitor C&S, CBC, renal function and clinical progression - F/u ability to change to PO    Temp (24hrs), Avg:97.6 F (36.4 C), Min:97.3 F (36.3 C), Max:97.8 F (36.6 C)   Recent Labs Lab 09/16/16 1056  WBC 17.6*  CREATININE 1.60*    CrCl cannot be calculated (Unknown ideal weight.).    Allergies  Allergen Reactions  . Demerol Shortness Of Breath  . Penicillins Swelling    "in the face" Has patient had a PCN reaction causing immediate rash, facial/tongue/throat swelling, SOB or lightheadedness with hypotension: yes Has patient had a PCN reaction causing severe rash involving mucus membranes or skin necrosis: no Has patient had a PCN reaction that required hospitalization: no  Has patient had a PCN reaction occurring within the last 10 years: no If all of the above answers are "NO", then may proceed with Cephalosporin use.   . Corticosteroids Other (See Comments)    DIZZINESS, HEADACHE  . Lorazepam Anxiety  . Tetracyclines & Related Rash  . Vancomycin Rash    Antimicrobials this admission: Levofloxacin 1/14 >>  Dose adjustments this admission:  Microbiology results:  Thank you for allowing pharmacy to be a part of this patient's care.  Dimitri Ped, PharmD, BCPS PGY-2 Infectious Diseases Pharmacy Resident Pager: [phone] 09/16/2016 3:04 PM

## 2016-09-16 NOTE — ED Notes (Signed)
Family is requesting to speak to Dr. Vanita Panda rt patient breathing and grimacing. The family feels the patient may need some pain medication. Dr. Vanita Panda notified and is at bedside.

## 2016-09-16 NOTE — Progress Notes (Signed)
Messaged [facility] for lactic acid of 2.4 per lab.

## 2016-09-16 NOTE — ED Provider Notes (Signed)
[facility] DEPT Provider Note   CSN: [id number] Arrival date & time: 09/16/16  1033     History   Chief Complaint Chief Complaint  Patient presents with  . Altered Mental Status    HPI YOKO BRIGGS is a 83 y.o. female.  HPI Patient presents from home with concern of listlessness. Patient is initially alone, having arrived via EMS. EMS reports the patient has been minimally interactive since yesterday, with no report of vomiting, diarrhea. Patient has a chronic indwelling Foley catheter. She is noted to have multiple medical issues, be on hospice care. The patient herself is nonverbal, does not offer any details of the history of present illness, level V caveat secondary to acute his condition.  After initial interventions started, family arrives. The patient was generally well until about yesterday, talkative, but over the past 24 hours has been minimally interactive. They note that she has been evaluated by hospice, is currently receiving their services.   Past Medical History:  Diagnosis Date  . Anemia   . Angina   . Arthritis   . Blood transfusion   . Chronic back pain   . Exertional dyspnea 01/17/12  . Fracture 2008   back  . GERD (gastroesophageal reflux disease)   . Glaucoma   . H/O hiatal hernia   . Headache(784.0)   . Hypertension   . Hypoglycemia   . Hypothyroidism   . Macular degeneration of both eyes   . Metabolic acidemia 123456  . Osteomyelitis ([place]) 11/2011   right knee  . Pneumonia 01/2010  . Shortness of breath 11/08/11   "here lately, all the time"  . Thyroid cancer [facility]) 1986    Patient Active Problem List   Diagnosis Date Noted  . Anxiety associated with depression 08/13/2016  . At risk for adverse drug reaction 07/17/2016  . Restless legs 07/17/2016  . Adult failure to thrive 07/03/2016  . Pain in neck   . Palliative care encounter   . Goals of care, counseling/discussion   . AKI (acute kidney injury) ([place]) 06/29/2016  .  Dehydration 06/29/2016  . Constipation 06/29/2016  . Hyponatremia 06/29/2016  . Elevated troponin 06/29/2016  . Weakness of right side of body 06/29/2016  . Hyperkalemia 06/29/2016  . Quadriparesis ([place]) 06/29/2016  . Pressure injury of skin 06/21/2016  . Acute cystitis without hematuria   . Upper extremity weakness 06/20/2016  . UTI (urinary tract infection) 06/20/2016  . Spinal stenosis 06/20/2016  . Bilateral leg weakness 06/20/2016  . Delirium   . Essential hypertension   . Postoperative anemia due to acute blood loss 01/26/2016  . GERD (gastroesophageal reflux disease) 01/21/2016  . Fracture of femoral neck, right, closed ([place]) 01/21/2016  . Closed displaced fracture of right femoral neck ([place]) 01/21/2016  . Closed right hip fracture ([place]) 01/21/2016  . Odontoid fracture ([place])   . Protein-calorie malnutrition, moderate ([place]) 02/19/2014  . Severe diarrhea 02/18/2014  . Metabolic acidosis 99991111  . Weight loss, unintentional 02/18/2014  . Rash of neck 02/18/2014  . Hypothyroidism 02/18/2014  . Chronic anticoagulation 02/18/2014  . Lumbar radiculopathy 12/28/2013  . DDD (degenerative disc disease), lumbosacral 12/28/2013  . Glaucoma 12/28/2013  . Herpes zona 12/28/2013  . Chronic ulcer of skin ([place]) 12/28/2013  . Arthropathia 11/02/2013  . Failed back syndrome of lumbar spine 11/02/2013  . Anemia of unknown etiology 01/21/2012  . Hypokalemia 01/19/2012  . Anemia due to blood loss, acute 01/19/2012  . Infection of prosthetic knee joint ([place]) 01/18/2012  . Drug  rash 12/14/2011  . Prosthetic joint infection ([place]) 12/14/2011  . GBS (group B streptococcus) infection 12/14/2011  . Hx of thyroid cancer 12/14/2011  . Bilateral lower extremity edema 12/14/2011    Past Surgical History:  Procedure Laterality Date  . ABDOMINAL HYSTERECTOMY  1963  . APPENDECTOMY    . BACK SURGERY     "3 back OR's;"; S/P "fractured back 2008"  . CARPAL TUNNEL RELEASE     bilaterally  .  CATARACT EXTRACTION W/ INTRAOCULAR LENS  IMPLANT, BILATERAL    . DILATION AND CURETTAGE OF UTERUS    . FRACTURE SURGERY     "broke back"  . goiter removed  1980's   "when I was young"  . hand OR  09/2011   "left hand; bones were coming together"  . HIP ARTHROPLASTY Right 01/23/2016   Procedure: ARTHROPLASTY BIPOLAR HIP (HEMIARTHROPLASTY);  Surgeon: Gaynelle Arabian, MD;  Location: [facility];  Service: Orthopedics;  Laterality: Right;  Depuy  . I&D KNEE WITH POLY EXCHANGE  11/10/2011   Procedure: IRRIGATION AND DEBRIDEMENT KNEE WITH POLY EXCHANGE;  Surgeon: Yvette Rack., MD;  Location: [facility];  Service: Orthopedics;  Laterality: Right;  . JOINT REPLACEMENT  2005, 2006, 2012, 11/10/11   left; left; right; right; right  . PERIPHERALLY INSERTED CENTRAL CATHETER INSERTION  11/2011   "removed 12/2011; LUE"  . post polyethelene exchange  11/09/11   RLE  . SHOULDER ARTHROSCOPY W/ ROTATOR CUFF REPAIR    . THYROIDECTOMY  1986   Cancer  . TONSILLECTOMY AND ADENOIDECTOMY    . TOTAL KNEE REVISION  04/28/2012   Procedure: TOTAL KNEE REVISION;  Surgeon: Rudean Haskell, MD;  Location: [facility];  Service: Orthopedics;  Laterality: Right;  . TRIGGER FINGER RELEASE     right  thumb    OB History    No data available       Home Medications    Prior to Admission medications   Medication Sig Start Date End Date Taking? Authorizing Provider  albuterol (PROVENTIL HFA;VENTOLIN HFA) 108 (90 Base) MCG/ACT inhaler Inhale 2 puffs into the lungs every 4 (four) hours as needed for wheezing or shortness of breath.   Yes Historical Provider, MD  brimonidine (ALPHAGAN) 0.2 % ophthalmic solution Place 1 drop into both eyes 2 (two) times daily. 01/12/16  Yes Historical Provider, MD  gabapentin (NEURONTIN) 300 MG capsule Take 300 mg by mouth 3 (three) times daily.    Yes Historical Provider, MD  levothyroxine (SYNTHROID, LEVOTHROID) 100 MCG tablet Take 100 mcg by mouth daily before breakfast.   Yes Historical Provider, MD    oxyCODONE (OXY IR/ROXICODONE) 5 MG immediate release tablet Take 5 mg by mouth every 4 (four) hours as needed for moderate pain. Take 1-2 tablets by mouth every 6 hours as needed for moderate to severe pain.    Yes Historical Provider, MD  propranolol (INDERAL) 20 MG tablet Take 20 mg by mouth daily. Takes for migraines.   Yes Historical Provider, MD  spironolactone (ALDACTONE) 25 MG tablet Take 25 mg by mouth daily. And 1 tablet after lunch if needed   Yes Historical Provider, MD  traZODone (DESYREL) 50 MG tablet Take 50-100 mg by mouth at bedtime.    Yes Historical Provider, MD  triamcinolone cream (KENALOG) 0.1 % Apply 1 application topically 2 (two) times daily. reddness of legs   Yes Historical Provider, MD  acetaminophen (TYLENOL) 500 MG tablet Take 500 mg by mouth every 4 (four) hours as needed for moderate pain.  Historical Provider, MD  dorzolamide-timolol (COSOPT) 22.3-6.8 MG/ML ophthalmic solution Place 1 drop into both eyes 2 (two) times daily. 01/12/16   Historical Provider, MD  DULoxetine (CYMBALTA) 30 MG capsule Take 60 mg by mouth daily.     Historical Provider, MD  DULoxetine (CYMBALTA) 30 MG capsule Take 60 mg by mouth daily.    Historical Provider, MD  latanoprost (XALATAN) 0.005 % ophthalmic solution Place 1 drop into both eyes 2 (two) times daily.  01/05/13   Historical Provider, MD  levothyroxine (SYNTHROID, LEVOTHROID) 125 MCG tablet Take 1 tablet (125 mcg total) by mouth daily before breakfast. Patient not taking: Reported on 09/16/2016 06/24/16   Eber Jones, MD  meloxicam (MOBIC) 7.5 MG tablet Take 7.5 mg by mouth 2 (two) times daily.     Historical Provider, MD  ondansetron (ZOFRAN) 4 MG tablet Take 4 mg by mouth every 4 to 6 hours as needed for nausea.    Historical Provider, MD  polyethylene glycol (MIRALAX / GLYCOLAX) packet Take 17 g by mouth daily. 07/03/16   Donne Hazel, MD    Family History Family History  Problem Relation Age of Onset  .  Intracerebral hemorrhage Mother   . Stroke Father   . Parkinson's disease Brother     Social History Social History  Substance Use Topics  . Smoking status: Never Smoker  . Smokeless tobacco: Never Used  . Alcohol use No     Allergies   Demerol; Penicillins; Corticosteroids; Lorazepam; Tetracyclines & related; and Vancomycin   Review of Systems Review of Systems  Unable to perform ROS: Patient nonverbal     Physical Exam Updated Vital Signs BP (!) 88/43   Pulse 60   Temp 97.8 F (36.6 C) (Axillary)   Resp 12   SpO2 96%   Physical Exam  Constitutional:  Noninteractive, sickly appearing elderly female w malodorous urine  HENT:  Head: Normocephalic and atraumatic.  Eyes: Conjunctivae and EOM are normal.  Cardiovascular: Normal rate and regular rhythm.   Pulmonary/Chest: Effort normal and breath sounds normal. No stridor. No respiratory distress.  Abdominal: She exhibits no distension.  Musculoskeletal: She exhibits no edema.  Neurological: She is unresponsive. She displays atrophy. No cranial nerve deficit.  Skin: Skin is warm and dry.  Psychiatric: Cognition and memory are impaired. She is noncommunicative.  Nursing note and vitals reviewed.    ED Treatments / Results  Labs (all labs ordered are listed, but only abnormal results are displayed) Labs Reviewed  COMPREHENSIVE METABOLIC PANEL - Abnormal; Notable for the following:       Result Value   Sodium 134 (*)    Glucose, Bld 156 (*)    BUN 59 (*)    Creatinine, Ser 1.60 (*)    Calcium 8.4 (*)    Total Protein 5.0 (*)    Albumin 2.2 (*)    Alkaline Phosphatase 215 (*)    GFR calc non Af Amer 27 (*)    GFR calc Af Amer 32 (*)    All other components within normal limits  CBC WITH DIFFERENTIAL/PLATELET - Abnormal; Notable for the following:    WBC 17.6 (*)    RBC 3.67 (*)    Hemoglobin 10.9 (*)    HCT 35.9 (*)    Neutro Abs 15.5 (*)    All other components within normal limits  URINALYSIS,  ROUTINE W REFLEX MICROSCOPIC    Radiology Dg Chest Port 1 View  Result Date: 09/16/2016 CLINICAL DATA:  RIGHT arm swelling,  altered level of consciousness since last night, cancer patient EXAM: PORTABLE CHEST 1 VIEW COMPARISON:  Portable exam 1059 hours compared to 01/21/2016 FINDINGS: Normal heart size, mediastinal contours, and pulmonary vascularity. Decreased lung volumes with bibasilar atelectasis since previous exam. Upper lungs clear. No pleural effusion or pneumothorax. Bones demineralized degenerative disc disease changes thoracic spine and evidence of prior lumbar fusion. IMPRESSION: Decreased lung volumes and bibasilar atelectasis versus previous study. Electronically Signed   By: Lavonia Dana M.D.   On: 09/16/2016 11:38    Procedures Procedures (including critical care time)  Medications Ordered in ED Medications  sodium chloride 0.9 % bolus 1,000 mL (1,000 mLs Intravenous New Bag/Given 09/16/16 1110)     Initial Impression / Assessment and Plan / ED Course  I have reviewed the triage vital signs and the nursing notes.  Pertinent labs & imaging results that were available during my care of the patient were reviewed by me and considered in my medical decision making (see chart for details).  Clinical Course    After family members) discussed all findings with our with them, cold of care, including comfort. I discussed patient's case with our hospice colleagues.  There is no room in inpatient hospice facility, and the patient will be admitted here for with hospice following. Patient received antibiotics for urinary tract infection, morphine for symptom control, and all findings, agreeable with plan.   Final Clinical Impressions(s) / ED Diagnoses   Final diagnoses:  Somnolence  Hypotension, unspecified hypotension type  UTI    Carmin Muskrat, MD 09/16/16 1446

## 2016-09-16 NOTE — Progress Notes (Signed)
Writer did not receive call back from [facility] regarding lactic acid levels.

## 2016-09-16 NOTE — Progress Notes (Signed)
Notified MD about lactic acid of 2.1. No new orders.

## 2016-09-16 NOTE — ED Triage Notes (Signed)
Per EMS- pt from home, on hospice DNR. Husband noted pt to be altered since last night. Pt has swelling to right arm. Pt is a cancer patient. IO placed in field by EMS after 2 unsuccessful IV attempts to left AC. Pt did not respond to painful stimuli during IO placement. PT arrives with a foley with clumpy substance noted into foley catheter drain. Pt unable to open eyes or follow commands.   PIV 20G placed in ER to [place], labs obtained upon arrival.

## 2016-09-16 NOTE — ED Notes (Signed)
Attempted report to 6N 

## 2016-09-16 NOTE — H&P (Signed)
History and Physical    [name] [id number] DOB: [birth year] DOA: 09/16/2016  Referring MD/NP/PA: EDP PCP: Merrilee Seashore, MD  Outpatient Specialists: Hospice Team Patient coming from: Home  Chief Complaint: Decreased Responsiveness  HPI: [name] is a 83 y.o. female Who was brought in by family today [name] ED with complaint of 24 hours history of decreased responsiveness.  She's usually interactive but has been listless at this time period.  She had not been eating.  No prior cardiopulmonary, GI or unknown urinary complaints.  Patient is lethargic and unable to give history. She is DNR and currently under hospice care. ED Course:  The emergency room patient was noted to be hypotensive, lethargic and unresponsive.  Initial lab work indicated pyuria with some leukocytosis. After discussions of the ED physician with family and hospice team, the family having indicated a wish to have her treated with antibiotics and IV fluid, it was agreed for her to be admitted for treatmnt by the hospitalist team since there was no bed in the hospice unit.    She was given IV fluid bolus as well as IV antibiotic at the ED.  Review of Systems: Unable to volunteer  Past Medical History:  Diagnosis Date  . Anemia   . Angina   . Arthritis   . Blood transfusion   . Chronic back pain   . Exertional dyspnea 01/17/12  . Fracture 2008   back  . GERD (gastroesophageal reflux disease)   . Glaucoma   . H/O hiatal hernia   . Headache(784.0)   . Hypertension   . Hypoglycemia   . Hypothyroidism   . Macular degeneration of both eyes   . Metabolic acidemia 123456  . Osteomyelitis ([place]) 11/2011   right knee  . Pneumonia 01/2010  . Shortness of breath 11/08/11   "here lately, all the time"  . Thyroid cancer ([place]) 1986    Past Surgical History:  Procedure Laterality Date  . ABDOMINAL HYSTERECTOMY  1963  . APPENDECTOMY    . BACK SURGERY     "3 back OR's;"; S/P "fractured back  2008"  . CARPAL TUNNEL RELEASE     bilaterally  . CATARACT EXTRACTION W/ INTRAOCULAR LENS  IMPLANT, BILATERAL    . DILATION AND CURETTAGE OF UTERUS    . FRACTURE SURGERY     "broke back"  . goiter removed  1980's   "when I was young"  . hand OR  09/2011   "left hand; bones were coming together"  . HIP ARTHROPLASTY Right 01/23/2016   Procedure: ARTHROPLASTY BIPOLAR HIP (HEMIARTHROPLASTY);  Surgeon: Gaynelle Arabian, MD;  [facility];  Service: Orthopedics;  Laterality: Right;  Depuy  . I&D KNEE WITH POLY EXCHANGE  11/10/2011   Procedure: IRRIGATION AND DEBRIDEMENT KNEE WITH POLY EXCHANGE;  Surgeon: Yvette Rack., MD;  Location: [facility];  Service: Orthopedics;  Laterality: Right;  . JOINT REPLACEMENT  2005, 2006, 2012, 11/10/11   left; left; right; right; right  . PERIPHERALLY INSERTED CENTRAL CATHETER INSERTION  11/2011   "removed 12/2011; LUE"  . post polyethelene exchange  11/09/11   RLE  . SHOULDER ARTHROSCOPY W/ ROTATOR CUFF REPAIR    . THYROIDECTOMY  1986   Cancer  . TONSILLECTOMY AND ADENOIDECTOMY    . TOTAL KNEE REVISION  04/28/2012   Procedure: TOTAL KNEE REVISION;  Surgeon: Rudean Haskell, MD;  Location: [facility];  Service: Orthopedics;  Laterality: Right;  . TRIGGER FINGER RELEASE     right  thumb     reports that she has never smoked. She has never used smokeless tobacco. She reports that she does not drink alcohol or use drugs.  Allergies  Allergen Reactions  . Demerol Shortness Of Breath  . Penicillins Swelling    "in the face" Has patient had a PCN reaction causing immediate rash, facial/tongue/throat swelling, SOB or lightheadedness with hypotension: yes Has patient had a PCN reaction causing severe rash involving mucus membranes or skin necrosis: no Has patient had a PCN reaction that required hospitalization: no  Has patient had a PCN reaction occurring within the last 10 years: no If all of the above answers are "NO", then may proceed with Cephalosporin use.   .  Corticosteroids Other (See Comments)    DIZZINESS, HEADACHE  . Lorazepam Anxiety  . Tetracyclines & Related Rash  . Vancomycin Rash    Family History  Problem Relation Age of Onset  . Intracerebral hemorrhage Mother   . Stroke Father   . Parkinson's disease Brother      Prior to Admission medications   Medication Sig Start Date End Date Taking? Authorizing Provider  albuterol (PROVENTIL HFA;VENTOLIN HFA) 108 (90 Base) MCG/ACT inhaler Inhale 2 puffs into the lungs every 4 (four) hours as needed for wheezing or shortness of breath.   Yes Historical Provider, MD  brimonidine (ALPHAGAN) 0.2 % ophthalmic solution Place 1 drop into both eyes 2 (two) times daily. 01/12/16  Yes Historical Provider, MD  gabapentin (NEURONTIN) 300 MG capsule Take 300 mg by mouth 3 (three) times daily.    Yes Historical Provider, MD  levothyroxine (SYNTHROID, LEVOTHROID) 100 MCG tablet Take 100 mcg by mouth daily before breakfast.   Yes Historical Provider, MD  oxyCODONE (OXY IR/ROXICODONE) 5 MG immediate release tablet Take 5 mg by mouth every 4 (four) hours as needed for moderate pain. Take 1-2 tablets by mouth every 6 hours as needed for moderate to severe pain.    Yes Historical Provider, MD  propranolol (INDERAL) 20 MG tablet Take 20 mg by mouth daily. Takes for migraines.   Yes Historical Provider, MD  spironolactone (ALDACTONE) 25 MG tablet Take 25 mg by mouth daily. And 1 tablet after lunch if needed   Yes Historical Provider, MD  traZODone (DESYREL) 50 MG tablet Take 50-100 mg by mouth at bedtime.    Yes Historical Provider, MD  triamcinolone cream (KENALOG) 0.1 % Apply 1 application topically 2 (two) times daily. reddness of legs   Yes Historical Provider, MD  acetaminophen (TYLENOL) 500 MG tablet Take 500 mg by mouth every 4 (four) hours as needed for moderate pain.    Historical Provider, MD  dorzolamide-timolol (COSOPT) 22.3-6.8 MG/ML ophthalmic solution Place 1 drop into both eyes 2 (two) times daily.  01/12/16   Historical Provider, MD  DULoxetine (CYMBALTA) 30 MG capsule Take 60 mg by mouth daily.     Historical Provider, MD  DULoxetine (CYMBALTA) 30 MG capsule Take 60 mg by mouth daily.    Historical Provider, MD  latanoprost (XALATAN) 0.005 % ophthalmic solution Place 1 drop into both eyes 2 (two) times daily.  01/05/13   Historical Provider, MD  levothyroxine (SYNTHROID, LEVOTHROID) 125 MCG tablet Take 1 tablet (125 mcg total) by mouth daily before breakfast. Patient not taking: Reported on 09/16/2016 06/24/16   Eber Jones, MD  meloxicam (MOBIC) 7.5 MG tablet Take 7.5 mg by mouth 2 (two) times daily.     Historical Provider, MD  ondansetron (ZOFRAN) 4 MG tablet Take 4  mg by mouth every 4 to 6 hours as needed for nausea.    Historical Provider, MD  polyethylene glycol (MIRALAX / GLYCOLAX) packet Take 17 g by mouth daily. 07/03/16   Donne Hazel, MD    Physical Exam: Vitals:   09/16/16 1104 09/16/16 1115 09/16/16 1126 09/16/16 1130  BP:  (!) 78/40  (!) 88/43  Pulse: 63   60  Resp: 11 13  12   Temp:   97.8 F (36.6 C)   TempSrc:   Axillary   SpO2: 98%   96%      Constitutional: NAD, calm, comfortable Vitals:   09/16/16 1104 09/16/16 1115 09/16/16 1126 09/16/16 1130  BP:  (!) 78/40  (!) 88/43  Pulse: 63   60  Resp: 11 13  12   Temp:   97.8 F (36.6 C)   TempSrc:   Axillary   SpO2: 98%   96%   Eyes: Sunken Eyes, PERRL, Pallor(+)   ENMT: Mucous membranes are dry. Posterior pharynx clear of any exudate or lesions.Normal dentition.  Neck: normal, supple, no masses, no thyromegaly Respiratory: clear to auscultation bilaterally, no wheezing, no crackles. Normal respiratory effort. No accessory muscle use.  Cardiovascular: Regular rate and rhythm, no murmurs / rubs / gallops. Abdomen: no tenderness, no masses palpated. No hepatosplenomegaly. Bowel sounds positive.  Musculoskeletal: no clubbing / cyanosis Neurologic: Lethargic but arousable.      Labs on Admission: I  have personally reviewed following labs and imaging studies  CBC:  Recent Labs Lab 09/16/16 1056  WBC 17.6*  NEUTROABS 15.5*  HGB 10.9*  HCT 35.9*  MCV 97.8  PLT [id number]   Basic Metabolic Panel:  Recent Labs Lab 09/16/16 1056  NA 134*  K 5.0  CL 101  CO2 23  GLUCOSE 156*  BUN 59*  CREATININE 1.60*  CALCIUM 8.4*   GFR: CrCl cannot be calculated (Unknown ideal weight.). Liver Function Tests:  Recent Labs Lab 09/16/16 1056  AST 18  ALT 15  ALKPHOS 215*  BILITOT 0.7  PROT 5.0*  ALBUMIN 2.2*   No results for input(s): LIPASE, AMYLASE in the last 168 hours. No results for input(s): AMMONIA in the last 168 hours. Coagulation Profile: No results for input(s): INR, PROTIME in the last 168 hours. Cardiac Enzymes: No results for input(s): CKTOTAL, CKMB, CKMBINDEX, TROPONINI in the last 168 hours. BNP (last 3 results) No results for input(s): PROBNP in the last 8760 hours. HbA1C: No results for input(s): HGBA1C in the last 72 hours. CBG: No results for input(s): GLUCAP in the last 168 hours. Lipid Profile: No results for input(s): CHOL, HDL, LDLCALC, TRIG, CHOLHDL, LDLDIRECT in the last 72 hours. Thyroid Function Tests: No results for input(s): TSH, T4TOTAL, FREET4, T3FREE, THYROIDAB in the last 72 hours. Anemia Panel: No results for input(s): VITAMINB12, FOLATE, FERRITIN, TIBC, IRON, RETICCTPCT in the last 72 hours. Urine analysis:    Component Value Date/Time   COLORURINE AMBER (A) 09/16/2016 1401   APPEARANCEUR TURBID (A) 09/16/2016 1401   LABSPEC 1.018 09/16/2016 1401   PHURINE 8.0 09/16/2016 1401   GLUCOSEU NEGATIVE 09/16/2016 1401   HGBUR NEGATIVE 09/16/2016 1401   BILIRUBINUR MODERATE (A) 09/16/2016 1401   KETONESUR NEGATIVE 09/16/2016 1401   PROTEINUR >=300 (A) 09/16/2016 1401   UROBILINOGEN 1.0 02/18/2014 1617   NITRITE NEGATIVE 09/16/2016 1401   LEUKOCYTESUR MODERATE (A) 09/16/2016 1401   Sepsis  Labs: @LABRCNTIP (procalcitonin:4,lacticidven:4) )No results found for this or any previous visit (from the past 240 hour(s)).   Radiological Exams on  Admission: Dg Chest Port 1 View  Result Date: 09/16/2016 CLINICAL DATA:  RIGHT arm swelling, altered level of consciousness since last night, cancer patient EXAM: PORTABLE CHEST 1 VIEW COMPARISON:  Portable exam 1059 hours compared to 01/21/2016 FINDINGS: Normal heart size, mediastinal contours, and pulmonary vascularity. Decreased lung volumes with bibasilar atelectasis since previous exam. Upper lungs clear. No pleural effusion or pneumothorax. Bones demineralized degenerative disc disease changes thoracic spine and evidence of prior lumbar fusion. IMPRESSION: Decreased lung volumes and bibasilar atelectasis versus previous study. Electronically Signed   By: Lavonia Dana M.D.   On: 09/16/2016 11:38    EKG:   Assessment/Plan Active Problems:   Hypotension   1 Altered Mental Status:  Due to that was #2-5. Patient is awaiting hospice admission but family wanted her to be treated with IV fluids and antibiotics for now according to ED physician.  I attempted to talk to the husband but could not reach him.  I discussed with Dr. Odetta Pink for hospice team evaluation and review tomorrow to engage the family regarding the extent of care    2. Dehydration:  IV fluid rehydration   3. Hypotension:  Due to dehydration.  Doubt  Sepsis of urinary source. Rehydration with IVF for now. F/u on  Lactic acid    4. Acute Renal Insufficiency:  Should improve with treatment of dehydration-follow clinically    5.  UTI:  Antibiotic.    Follow-up on urine and the cultures   DVT prophylaxis: SCD) Code Status: (DNR / Hospice ) Family Communication:   Disposition Plan: Hospice Consults called: Hospice, Dr Alferd Patee Admission status: inpatient medical floor   OSEI-BONSU,Wafaa Deemer MD [facility] Pager [phone].  If  7PM-7AM, please contact night-coverage [email or web address] Password [email or web address]  09/16/2016, 3:09 PM

## 2016-09-17 DIAGNOSIS — E860 Dehydration: Secondary | ICD-10-CM

## 2016-09-17 DIAGNOSIS — I9589 Other hypotension: Secondary | ICD-10-CM

## 2016-09-17 LAB — BASIC METABOLIC PANEL
Anion gap: 10 (ref 5–15)
BUN: 67 mg/dL — AB (ref 6–20)
CO2: 22 mmol/L (ref 22–32)
Calcium: 8.2 mg/dL — ABNORMAL LOW (ref 8.9–10.3)
Chloride: 101 mmol/L (ref 101–111)
Creatinine, Ser: 2.35 mg/dL — ABNORMAL HIGH (ref 0.44–1.00)
GFR calc Af Amer: 20 mL/min — ABNORMAL LOW (ref >60)
GFR, EST NON AFRICAN AMERICAN: 17 mL/min — AB (ref >60)
GLUCOSE: 106 mg/dL — AB (ref 65–99)
Potassium: 4.9 mmol/L (ref 3.5–5.1)
SODIUM: 133 mmol/L — AB (ref 135–145)

## 2016-09-17 LAB — CBC
HCT: 31.9 % — ABNORMAL LOW (ref 36.0–46.0)
Hemoglobin: 9.6 g/dL — ABNORMAL LOW (ref 12.0–15.0)
MCH: 29.3 pg (ref 26.0–34.0)
MCHC: 30.1 g/dL (ref 30.0–36.0)
MCV: 97.3 fL (ref 78.0–100.0)
PLATELETS: 294 10*3/uL (ref 150–400)
RBC: 3.28 MIL/uL — ABNORMAL LOW (ref 3.87–5.11)
RDW: 14.5 % (ref 11.5–15.5)
WBC: 15.1 10*3/uL — AB (ref 4.0–10.5)

## 2016-09-17 MED ORDER — COLLAGENASE 250 UNIT/GM EX OINT
TOPICAL_OINTMENT | Freq: Every day | CUTANEOUS | Status: DC
  Administered 2016-09-17: 12:00:00 via TOPICAL
  Filled 2016-09-17 (×2): qty 30

## 2016-09-17 MED ORDER — LEVOFLOXACIN 500 MG PO TABS: 500.0000 mg | ORAL_TABLET | Freq: Every day | ORAL | 0 refills | Status: AC

## 2016-09-17 NOTE — Consult Note (Signed)
   [facility] CM Inpatient Consult   09/17/2016  SAMAI ALMADA 02-Jul-1925 [id number]   Patient listed in the Medicare ACO Registry for 3rd admission in 6 months. .  Patient is 83 y.o.female who was brought in by family for evaluation of decreased responsiveness and poor oral intake. Pt in under hospice care per MD note.  Patient admitted with SIRS.  Patient has full care management under the Hospice care program likely to discharge to [facility] per notes. . No [facility] needs for Care Management.  For questions, please contact:  Natividad Brood, RN BSN [facility] Liaison  [phone] business mobile phone Toll free office [phone]

## 2016-09-17 NOTE — Discharge Summary (Addendum)
Physician Discharge Summary  [name] [id number] DOB: [birth year] DOA: 09/16/2016  PCP: Merrilee Seashore, MD  Admit date: 09/16/2016 Discharge date: 09/17/2016  Recommendations for Outpatient Follow-up:  1. Pt will be discharged to residential hospice  2. Please note that antihypertensive meds discontinued as BP is low   Discharge Diagnoses:  Active Problems:   Dehydration   Hypotension  Discharge Condition: Stable  Diet recommendation: comfort feeding as pt able to tolerate   Brief Narrative:  83 y.o.female who was brought in by family for evaluation of decreased responsiveness and poor oral intake. Pt in under hospice care.   Assessment & Plan:   SIRS secondary to UTI from chronic indwelling catheter  - UA suggestive of UTI - pt met criteria for SIRS with hypotension, leukocytosis  - currently on Levaquin and will continue same regimen for now to complete therapy   Hypothyroidism - TSH > 15, continue synthroid   Acute on chronic kidney disease stage IV - per record review, Cr in October 2017 was as high as 1.7 with GFT in 20's - this new increase in Cr likely pre renal from dehydration and hypotension  Hospice care - continue current regimen and CM consult for assistance to resume hospice care   Anemia of chronic illness, CKD - slight drop in Hg since admission but no signs of active bleeding   Unstageable Pressure Injury; sacrum: 4cm x 6.5cm x 0cm Stage 3 Pressure injury; right heel; 2.5cm x 2.0cm x 0.2cm  - Dressing procedure/placement/frequency: 1. Add low air loss mattress for pressure redistribution 2. Add Prevalon boots bilaterally for offloading heels 3. Foam dressing only to the right heel for drainage and protection 4. Add enzymatic debridement agent to the sacrum, top with moist gauze, ABD pad.   Code Status: DNR Family Communication: no family at bedside  Disposition Plan: residential hospice   Consultants:   None  CM for  resumption of hospice care   Procedures:   None  Antimicrobials:   Levaquin 1/14 -->   Procedures/Studies: Dg Chest Port 1 View  Result Date: 09/16/2016 CLINICAL DATA:  RIGHT arm swelling, altered level of consciousness since last night, cancer patient EXAM: PORTABLE CHEST 1 VIEW COMPARISON:  Portable exam 1059 hours compared to 01/21/2016 FINDINGS: Normal heart size, mediastinal contours, and pulmonary vascularity. Decreased lung volumes with bibasilar atelectasis since previous exam. Upper lungs clear. No pleural effusion or pneumothorax. Bones demineralized degenerative disc disease changes thoracic spine and evidence of prior lumbar fusion. IMPRESSION: Decreased lung volumes and bibasilar atelectasis versus previous study. Electronically Signed   By: Lavonia Dana M.D.   On: 09/16/2016 11:38     Discharge Exam: Vitals:   09/16/16 2045 09/17/16 0533  BP: (!) 121/99 104/74  Pulse: 76 70  Resp: 15 16  Temp: 98.3 F (36.8 C) 98.2 F (36.8 C)   Vitals:   09/16/16 1555 09/16/16 1637 09/16/16 2045 09/17/16 0533  BP: (!) 73/51 (!) 95/52 (!) 121/99 104/74  Pulse: 69 68 76 70  Resp: _0 Temp:  97.5 F (36.4 C) 98.3 F (36.8 C) 98.2 F (36.8 C)  TempSrc:  Axillary Oral Oral  SpO2: 96% 97% 100% 99%    General: Pt is not in distress, somnolent  Cardiovascular: Regular rate and rhythm, S1/S2 +, no rubs, no gallops Respiratory: diminished breath sounds at bases   Discharge Instructions  Discharge Instructions    Diet - low sodium heart healthy    Complete by:  As directed  Increase activity slowly    Complete by:  As directed      Allergies as of 09/17/2016      Reactions   Demerol Shortness Of Breath   Penicillins Swelling   "in the face" Has patient had a PCN reaction causing immediate rash, facial/tongue/throat swelling, SOB or lightheadedness with hypotension: yes Has patient had a PCN reaction causing severe rash involving mucus membranes or skin  necrosis: no Has patient had a PCN reaction that required hospitalization: no  Has patient had a PCN reaction occurring within the last 10 years: no If all of the above answers are "NO", then may proceed with Cephalosporin use.   Corticosteroids Other (See Comments)   DIZZINESS, HEADACHE   Lorazepam Anxiety   Tetracyclines & Related Rash   Vancomycin Rash      Medication List    STOP taking these medications   DULoxetine 30 MG capsule Commonly known as:  CYMBALTA   gabapentin 300 MG capsule Commonly known as:  NEURONTIN   meloxicam 7.5 MG tablet Commonly known as:  MOBIC   oxyCODONE 5 MG immediate release tablet Commonly known as:  Oxy IR/ROXICODONE   polyethylene glycol packet Commonly known as:  MIRALAX / GLYCOLAX   propranolol 20 MG tablet Commonly known as:  INDERAL   spironolactone 25 MG tablet Commonly known as:  ALDACTONE     TAKE these medications   acetaminophen 500 MG tablet Commonly known as:  TYLENOL Take 500 mg by mouth every 4 (four) hours as needed for moderate pain.   albuterol 108 (90 Base) MCG/ACT inhaler Commonly known as:  PROVENTIL HFA;VENTOLIN HFA Inhale 2 puffs into the lungs every 4 (four) hours as needed for wheezing or shortness of breath.   brimonidine 0.2 % ophthalmic solution Commonly known as:  ALPHAGAN Place 1 drop into both eyes 2 (two) times daily.   dorzolamide-timolol 22.3-6.8 MG/ML ophthalmic solution Commonly known as:  COSOPT Place 1 drop into both eyes 2 (two) times daily.   latanoprost 0.005 % ophthalmic solution Commonly known as:  XALATAN Place 1 drop into both eyes 2 (two) times daily.   levofloxacin 500 MG tablet Commonly known as:  LEVAQUIN Take 1 tablet (500 mg total) by mouth daily.   levothyroxine 100 MCG tablet Commonly known as:  SYNTHROID, LEVOTHROID Take 100 mcg by mouth daily before breakfast.   levothyroxine 125 MCG tablet Commonly known as:  SYNTHROID, LEVOTHROID Take 1 tablet (125 mcg total) by  mouth daily before breakfast.   ondansetron 4 MG tablet Commonly known as:  ZOFRAN Take 4 mg by mouth every 4 to 6 hours as needed for nausea.   traZODone 50 MG tablet Commonly known as:  DESYREL Take 50-100 mg by mouth at bedtime.   triamcinolone cream 0.1 % Commonly known as:  KENALOG Apply 1 application topically 2 (two) times daily. reddness of legs         The results of significant diagnostics from this hospitalization (including imaging, microbiology, ancillary and laboratory) are listed below for reference.     Microbiology: Recent Results (from the past 240 hour(s))  Culture, blood (Routine X 2) w Reflex to ID Panel     Status: None (Preliminary result)   Collection Time: 09/16/16  4:45 PM  Result Value Ref Range Status   Specimen Description BLOOD RIGHT ANTECUBITAL  Final   Special Requests IN PEDIATRIC BOTTLE 1.0CC  Final   Culture NO GROWTH < 24 HOURS  Final   Report Status PENDING  Incomplete  Culture, blood (Routine X 2) w Reflex to ID Panel     Status: None (Preliminary result)   Collection Time: 09/16/16  4:54 PM  Result Value Ref Range Status   Specimen Description BLOOD RAC  Final   Special Requests IN PEDIATRIC BOTTLE 1CC  Final   Culture NO GROWTH < 24 HOURS  Final   Report Status PENDING  Incomplete     Labs: Basic Metabolic Panel:  Recent Labs Lab 09/16/16 1056 09/17/16 0505  NA 134* 133*  K 5.0 4.9  CL 101 101  CO2 23 22  GLUCOSE 156* 106*  BUN 59* 67*  CREATININE 1.60* 2.35*  CALCIUM 8.4* 8.2*   Liver Function Tests:  Recent Labs Lab 09/16/16 1056  AST 18  ALT 15  ALKPHOS 215*  BILITOT 0.7  PROT 5.0*  ALBUMIN 2.2*   CBC:  Recent Labs Lab 09/16/16 1056 09/17/16 0505  WBC 17.6* 15.1*  NEUTROABS 15.5*  --   HGB 10.9* 9.6*  HCT 35.9* 31.9*  MCV 97.8 97.3  PLT 350 294   SIGNED: Time coordinating discharge: 30 minutes  Faye Ramsay, MD  [facility] 09/17/2016, 12:28 PM Pager [phone]  If 7PM-7AM,  please contact night-coverage [email or web address] Password [email or web address]

## 2016-09-17 NOTE — Progress Notes (Signed)
Transport here to pick up pt. Report was called to [facility]. Pt's iv was taken out. Family at bedside. D/C  Papers sent with pt.

## 2016-09-17 NOTE — Discharge Instructions (Signed)
Hypotension As your heart beats, it forces blood through your body. This force is called blood pressure. If you have hypotension, you have low blood pressure. When your blood pressure is too low, you may not get enough blood to your brain. You may feel weak, feel light-headed, have a fast heartbeat, or even pass out (faint). Follow these instructions at home: Eating and drinking  Drink enough fluids to keep your pee (urine) clear or pale yellow.  Eat a healthy diet, and follow instructions from your doctor about eating or drinking restrictions. A healthy diet includes: ? Fresh fruits and vegetables. ? Whole grains. ? Low-fat (lean) meats. ? Low-fat dairy products.  Eat extra salt only as told. Do not add extra salt to your diet unless your doctor tells you to.  Eat small meals often.  Avoid standing up quickly after you eat. Medicines  Take over-the-counter and prescription medicines only as told by your doctor. ? Follow instructions from your doctor about changing how much you take (the dosage) of your medicines, if this applies. ? Do not stop or change your medicine on your own. General instructions  Wear compression stockings as told by your doctor.  Get up slowly from lying down or sitting.  Avoid hot showers and a lot of heat as told by your doctor.  Return to your normal activities as told by your doctor. Ask what activities are safe for you.  Do not use any products that contain nicotine or tobacco, such as cigarettes and e-cigarettes. If you need help quitting, ask your doctor.  Keep all follow-up visits as told by your doctor. This is important. Contact a doctor if:  You throw up (vomit).  You have watery poop (diarrhea).  You have a fever for more than 2-3 days.  You feel more thirsty than normal.  You feel weak and tired. Get help right away if:  You have chest pain.  You have a fast or irregular heartbeat.  You lose feeling (get numbness) in any part  of your body.  You cannot move your arms or your legs.  You have trouble talking.  You get sweaty or feel light-headed.  You faint.  You have trouble breathing.  You have trouble staying awake.  You feel confused. This information is not intended to replace advice given to you by your health care provider. Make sure you discuss any questions you have with your health care provider. Document Released: 11/14/2009 Document Revised: 05/08/2016 Document Reviewed: 05/08/2016 Elsevier Interactive Patient Education  2017 Elsevier Inc.   

## 2016-09-17 NOTE — Progress Notes (Signed)
Patient ID: [name], female   DOB: [birth year], 83 y.o.   MRN: [id number]    PROGRESS NOTE    [name]  [id number] DOB: July 13, 1925 DOA: 09/16/2016  PCP: Merrilee Seashore, MD   Brief Narrative:  83 y.o. female who was brought in by family for evaluation of decreased responsiveness and poor oral intake. Pt in under hospice care.   Assessment & Plan:   SIRS secondary to UTI - UA suggestive of UTI - pt met criteria for SIRS with hypotension, leukocytosis  - currently on Levaquin and will continue same regimen for now - follow up on urine and blood cultures  Hypothyroidism - TSH > 15, continue synthroid   Acute on chronic kidney disease stage IV - per record review, Cr in October 2017 was as high as 1.7 with GFT in 20's - this new increase in Cr likely pre renal from dehydration and hypotension - continue with IVF and repeat BMP in AM  Hospice care - continue current regimen and CM consult for assistance to resume hospice care   Anemia of chronic illness, CKD - slight drop in Hg since admission but no signs of active bleeding  - CBC in AM  Unstageable Pressure Injury; sacrum: 4cm x 6.5cm x 0cm Stage 3 Pressure injury; right heel; 2.5cm x 2.0cm x 0.2cm  - Dressing procedure/placement/frequency: 1. Add low air loss mattress for pressure redistribution 2. Add Prevalon boots bilaterally for offloading heels 3. Foam dressing only to the right heel for drainage and protection 4. Add enzymatic debridement agent to the sacrum, top with moist gauze, ABD pad.  DVT prophylaxis: SCD's Code Status: DNR Family Communication: Patient at bedside, no family at bedside, my card left at bedside with information to call me with questions and for update  Disposition Plan: to be determined, CM consulted for assistance to resume hospice care   Consultants:   None  CM for resumption of hospice care   Procedures:   None  Antimicrobials:   Levaquin 1/14 -->    Subjective: No events overnight, pt rather somnolent this AM.   Objective: Vitals:   09/16/16 1555 09/16/16 1637 09/16/16 2045 09/17/16 0533  BP: (!) 73/51 (!) 95/52 (!) 121/99 104/74  Pulse: 69 68 76 70  Resp: '13 13 15 16  ' Temp:  97.5 F (36.4 C) 98.3 F (36.8 C) 98.2 F (36.8 C)  TempSrc:  Axillary Oral Oral  SpO2: 96% 97% 100% 99%    Intake/Output Summary (Last 24 hours) at 09/17/16 1021 Last data filed at 09/17/16 0900  Gross per 24 hour  Intake          1365.67 ml  Output                0 ml  Net          1365.67 ml   There were no vitals filed for this visit.  Examination:  General exam: Appears somnolent but can open eyes with verbal stimuli Respiratory system: Respiratory effort reasonable but diminished air movement at bases  Cardiovascular system: S1 & S2 heard, RRR. No JVD, murmurs, rubs, gallops or clicks. No pedal edema. Gastrointestinal system: Abdomen is nondistended, soft and nontender. No organomegaly or masses felt. Central nervous system: somnolent, moving al 4 extremities spontaneously   Data Reviewed: I have personally reviewed following labs and imaging studies  CBC:  Recent Labs Lab 09/16/16 1056 09/17/16 0505  WBC 17.6* 15.1*  NEUTROABS 15.5*  --   HGB 10.9* 9.6*  HCT 35.9* 31.9*  MCV 97.8 97.3  PLT 350 329   Basic Metabolic Panel:  Recent Labs Lab 09/16/16 1056 09/17/16 0505  NA 134* 133*  K 5.0 4.9  CL 101 101  CO2 23 22  GLUCOSE 156* 106*  BUN 59* 67*  CREATININE 1.60* 2.35*  CALCIUM 8.4* 8.2*   Liver Function Tests:  Recent Labs Lab 09/16/16 1056  AST 18  ALT 15  ALKPHOS 215*  BILITOT 0.7  PROT 5.0*  ALBUMIN 2.2*   Thyroid Function Tests:  Recent Labs  09/16/16 1634  TSH 15.719*   Urine analysis:    Component Value Date/Time   COLORURINE AMBER (A) 09/16/2016 1401   APPEARANCEUR TURBID (A) 09/16/2016 1401   LABSPEC 1.018 09/16/2016 1401   PHURINE 8.0 09/16/2016 1401   GLUCOSEU NEGATIVE 09/16/2016  1401   HGBUR NEGATIVE 09/16/2016 1401   BILIRUBINUR MODERATE (A) 09/16/2016 1401   KETONESUR NEGATIVE 09/16/2016 1401   PROTEINUR >=300 (A) 09/16/2016 1401   UROBILINOGEN 1.0 02/18/2014 1617   NITRITE NEGATIVE 09/16/2016 1401   LEUKOCYTESUR MODERATE (A) 09/16/2016 1401   Radiology Studies: Dg Chest Port 1 View Result Date: 09/16/2016 Decreased lung volumes and bibasilar atelectasis versus previous study.   Scheduled Meds: . collagenase   Topical Daily  . [START ON 09/18/2016] levofloxacin (LEVAQUIN) IV  500 mg Intravenous Q48H  . levothyroxine  100 mcg Oral QAC breakfast   Continuous Infusions: . dextrose 5 % and 0.45% NaCl 100 mL/hr at 09/17/16 0551    LOS: 1 day   Time spent: 20 minutes   Faye Ramsay, MD [facility] Pager [phone]  If 7PM-7AM, please contact night-coverage [email or web address] Password [facility] 09/17/2016, 10:21 AM

## 2016-09-17 NOTE — Progress Notes (Signed)
[facility] RN Liaison met with [name].  She was more alert today but when she talked she did not make much sense.  She stated she felt like she was dying and knows she is pretty sick and cannot go home.  SW offered support during visit.  SW spoke to [name]'s Step-Son, Linna Hoff, over the phone and went over the financial assessment for [facility] and talked to him about bed availability.  SW did talk to [facility] this morning and it is unclear regarding bed availability at this time.  SW will continue to follow and provide assistance as needed.  Cecilio Asper, [place] [facility] [place]

## 2016-09-17 NOTE — Clinical Social Work Note (Signed)
Pt is ready for dishcarge today and will go to [facility]. Pt's family is aware and agreeable to discharge plan. [facility] has received discharge information and is ready to admit pt as the paperwork is complete. RN called report. [facility] will provide transportation. CSW is signing off as no further needs identified.   Darden Dates, MSW, LCSW Clinical Social Worker  [phone]

## 2016-09-17 NOTE — Progress Notes (Signed)
Home Care SW made visit back to the hospital to visit with Patient's Step-Son and Husband to discuss [facility] further and had them sign the financial assessment.  During visit SW spoke to [facility] RN Liaison and [facility] and a bed was offered for today.  SW will follow up with Patient once she is at [facility].  [name], [place] [facility] [place]

## 2016-09-17 NOTE — Consult Note (Signed)
[place] Nurse wound consult note Reason for Consult: pressure injury Wound type: Unstageable Pressure Injury; sacrum: 4cm x 6.5cm x 0cm Stage 3 Pressure injury; right heel; 2.5cm x 2.0cm x 0.2cm  Pressure Injury POA: Yes Measurement: see above  Wound bed: Sacrum: 100% soft, black Right heel: 100% pink, moist Drainage (amount, consistency, odor) moderate, yellow/brown from each site but not purulent Periwound: intact at each site Dressing procedure/placement/frequency: 1. Add low air loss mattress for pressure redistribution 2.  Add Prevalon boots bilaterally for offloading heels 3.  Foam dressing only to the right heel for drainage and protection 4.  Add enzymatic debridement agent to the sacrum, top with moist gauze, ABD pad.  Discussed POC with patient and bedside nurse.  Re consult if needed, will not follow at this time. Thanks  Margarett Viti [employer], RN,CWOCN, CNS [phone])

## 2016-09-18 DIAGNOSIS — L89159 Pressure ulcer of sacral region, unspecified stage: Secondary | ICD-10-CM | POA: Diagnosis not present

## 2016-09-18 DIAGNOSIS — N189 Chronic kidney disease, unspecified: Secondary | ICD-10-CM | POA: Diagnosis not present

## 2016-09-18 DIAGNOSIS — I10 Essential (primary) hypertension: Secondary | ICD-10-CM | POA: Diagnosis not present

## 2016-09-18 DIAGNOSIS — S14109S Unspecified injury at unspecified level of cervical spinal cord, sequela: Secondary | ICD-10-CM | POA: Diagnosis not present

## 2016-09-18 DIAGNOSIS — M4802 Spinal stenosis, cervical region: Secondary | ICD-10-CM | POA: Diagnosis not present

## 2016-09-18 DIAGNOSIS — R1310 Dysphagia, unspecified: Secondary | ICD-10-CM | POA: Diagnosis not present

## 2016-09-19 DIAGNOSIS — I10 Essential (primary) hypertension: Secondary | ICD-10-CM | POA: Diagnosis not present

## 2016-09-19 DIAGNOSIS — N189 Chronic kidney disease, unspecified: Secondary | ICD-10-CM | POA: Diagnosis not present

## 2016-09-19 DIAGNOSIS — M4802 Spinal stenosis, cervical region: Secondary | ICD-10-CM | POA: Diagnosis not present

## 2016-09-19 DIAGNOSIS — L89159 Pressure ulcer of sacral region, unspecified stage: Secondary | ICD-10-CM | POA: Diagnosis not present

## 2016-09-19 DIAGNOSIS — R1310 Dysphagia, unspecified: Secondary | ICD-10-CM | POA: Diagnosis not present

## 2016-09-19 DIAGNOSIS — S14109S Unspecified injury at unspecified level of cervical spinal cord, sequela: Secondary | ICD-10-CM | POA: Diagnosis not present

## 2016-09-20 DIAGNOSIS — N189 Chronic kidney disease, unspecified: Secondary | ICD-10-CM | POA: Diagnosis not present

## 2016-09-20 DIAGNOSIS — R1310 Dysphagia, unspecified: Secondary | ICD-10-CM | POA: Diagnosis not present

## 2016-09-20 DIAGNOSIS — S14109S Unspecified injury at unspecified level of cervical spinal cord, sequela: Secondary | ICD-10-CM | POA: Diagnosis not present

## 2016-09-20 DIAGNOSIS — M4802 Spinal stenosis, cervical region: Secondary | ICD-10-CM | POA: Diagnosis not present

## 2016-09-20 DIAGNOSIS — I10 Essential (primary) hypertension: Secondary | ICD-10-CM | POA: Diagnosis not present

## 2016-09-20 DIAGNOSIS — L89159 Pressure ulcer of sacral region, unspecified stage: Secondary | ICD-10-CM | POA: Diagnosis not present

## 2016-09-21 DIAGNOSIS — M4802 Spinal stenosis, cervical region: Secondary | ICD-10-CM | POA: Diagnosis not present

## 2016-09-21 DIAGNOSIS — R1310 Dysphagia, unspecified: Secondary | ICD-10-CM | POA: Diagnosis not present

## 2016-09-21 DIAGNOSIS — S14109S Unspecified injury at unspecified level of cervical spinal cord, sequela: Secondary | ICD-10-CM | POA: Diagnosis not present

## 2016-09-21 DIAGNOSIS — L89159 Pressure ulcer of sacral region, unspecified stage: Secondary | ICD-10-CM | POA: Diagnosis not present

## 2016-09-21 DIAGNOSIS — N189 Chronic kidney disease, unspecified: Secondary | ICD-10-CM | POA: Diagnosis not present

## 2016-09-21 DIAGNOSIS — I10 Essential (primary) hypertension: Secondary | ICD-10-CM | POA: Diagnosis not present

## 2016-09-21 LAB — CULTURE, BLOOD (ROUTINE X 2)
Culture: NO GROWTH
Culture: NO GROWTH

## 2016-09-22 DIAGNOSIS — M4802 Spinal stenosis, cervical region: Secondary | ICD-10-CM | POA: Diagnosis not present

## 2016-09-22 DIAGNOSIS — N189 Chronic kidney disease, unspecified: Secondary | ICD-10-CM | POA: Diagnosis not present

## 2016-09-22 DIAGNOSIS — R1310 Dysphagia, unspecified: Secondary | ICD-10-CM | POA: Diagnosis not present

## 2016-09-22 DIAGNOSIS — S14109S Unspecified injury at unspecified level of cervical spinal cord, sequela: Secondary | ICD-10-CM | POA: Diagnosis not present

## 2016-09-22 DIAGNOSIS — L89159 Pressure ulcer of sacral region, unspecified stage: Secondary | ICD-10-CM | POA: Diagnosis not present

## 2016-09-22 DIAGNOSIS — I10 Essential (primary) hypertension: Secondary | ICD-10-CM | POA: Diagnosis not present

## 2016-09-23 DIAGNOSIS — L89159 Pressure ulcer of sacral region, unspecified stage: Secondary | ICD-10-CM | POA: Diagnosis not present

## 2016-09-23 DIAGNOSIS — M4802 Spinal stenosis, cervical region: Secondary | ICD-10-CM | POA: Diagnosis not present

## 2016-09-23 DIAGNOSIS — R1310 Dysphagia, unspecified: Secondary | ICD-10-CM | POA: Diagnosis not present

## 2016-09-23 DIAGNOSIS — S14109S Unspecified injury at unspecified level of cervical spinal cord, sequela: Secondary | ICD-10-CM | POA: Diagnosis not present

## 2016-09-23 DIAGNOSIS — N189 Chronic kidney disease, unspecified: Secondary | ICD-10-CM | POA: Diagnosis not present

## 2016-09-23 DIAGNOSIS — I10 Essential (primary) hypertension: Secondary | ICD-10-CM | POA: Diagnosis not present

## 2016-10-04 DEATH — deceased

## 2018-05-30 IMAGING — CT CT ABD-PELV W/ CM
2 of 5 series · 15 of 46 positions shown, 17 images · IV contrast (ISOVUE)
Comparison: CT from 02/18/2014

CLINICAL DATA: Abdominal distention. Patient is more lethargic
normal.

EXAM:
CT ABDOMEN AND PELVIS WITH CONTRAST
TECHNIQUE: Multidetector CT imaging of the abdomen and pelvis was performed
using the standard protocol following bolus administration of
intravenous contrast.
CONTRAST:  75mL WGGXWV-LNN IOPAMIDOL (WGGXWV-LNN) INJECTION 61%

[Series 2: abd/pel with · axial · 0.74mm/px · z∈[-770,-405]mm · 12 of 85 slices shown, 14 images]
[im 6/85  soft-tissue]
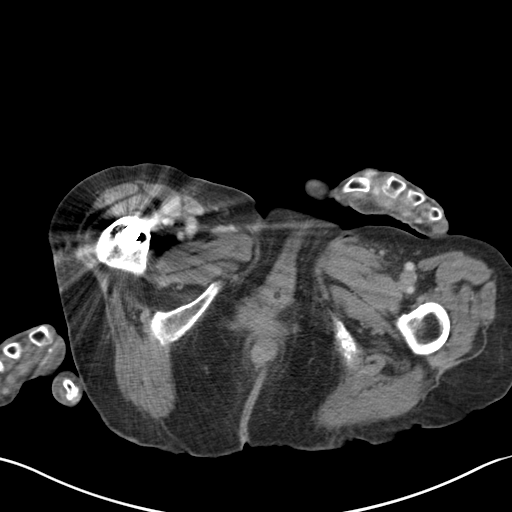
[im 6/85  bone]
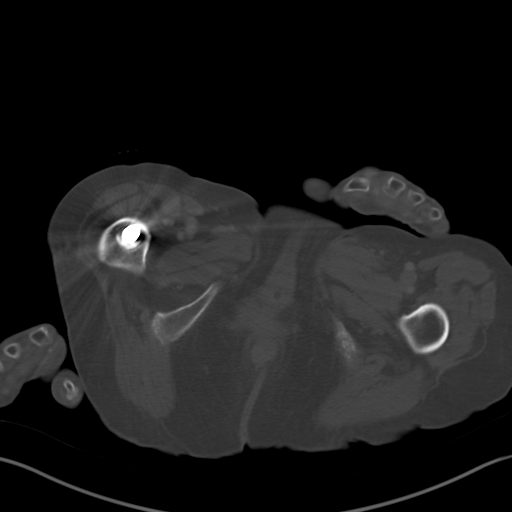
[im 12/85  soft-tissue]
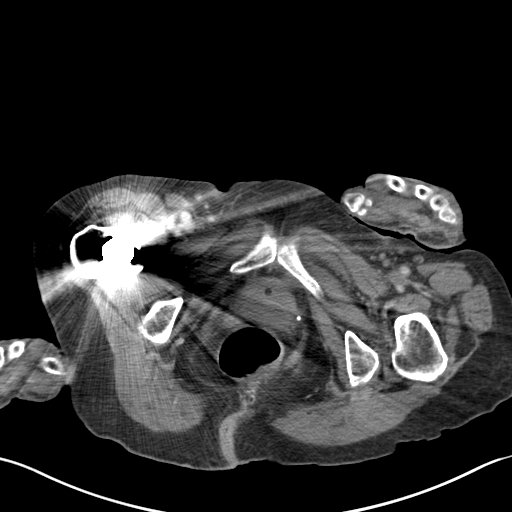
[im 17/85  soft-tissue]
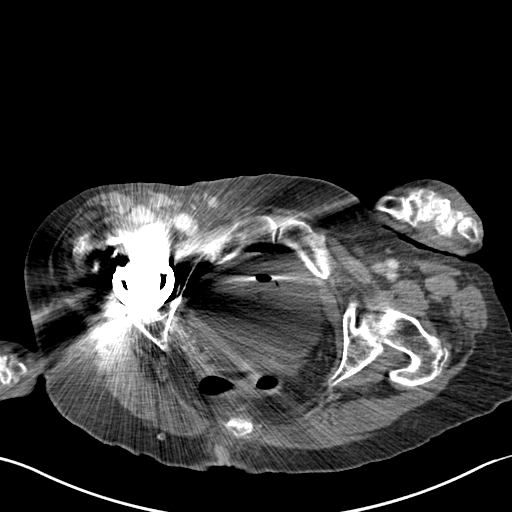
[im 29/85  soft-tissue]
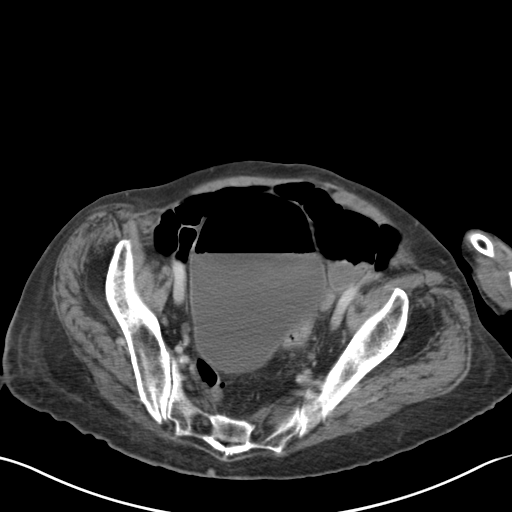
[im 34/85  soft-tissue]
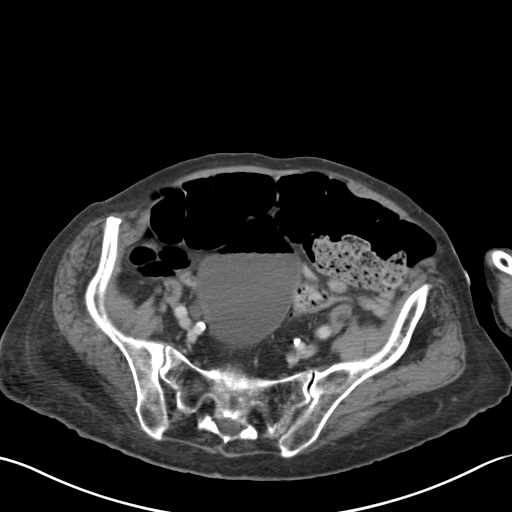
[im 40/85  soft-tissue]
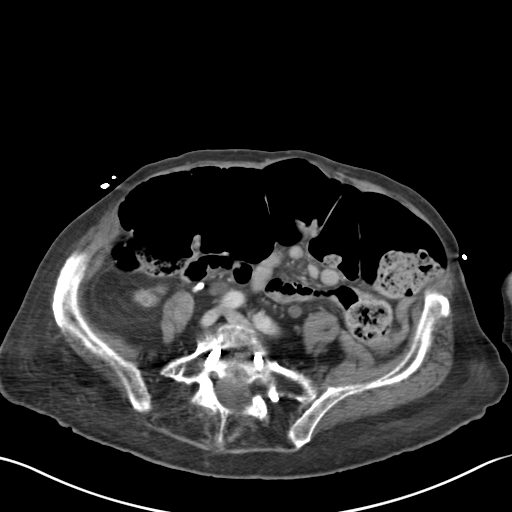
[im 45/85  soft-tissue]
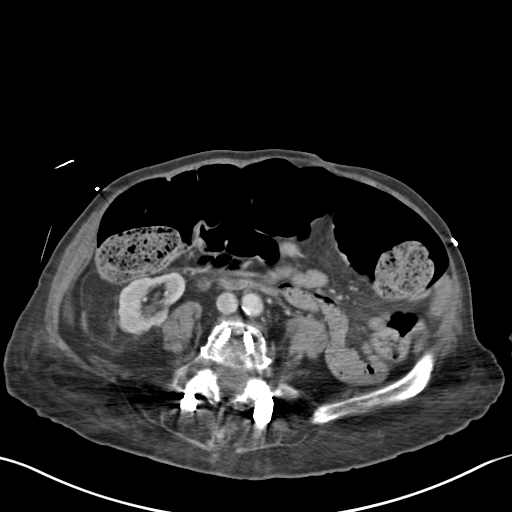
[im 51/85  soft-tissue]
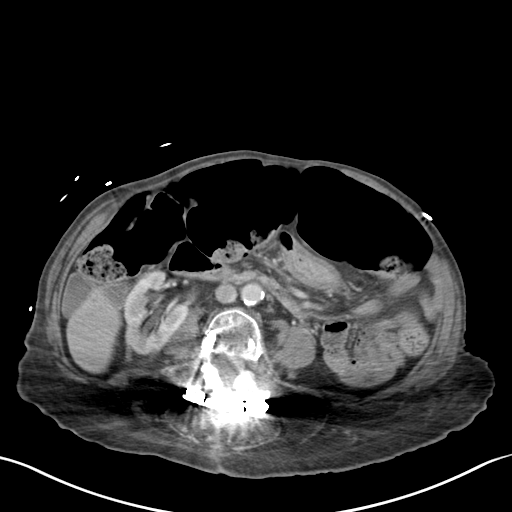
[im 57/85  soft-tissue]
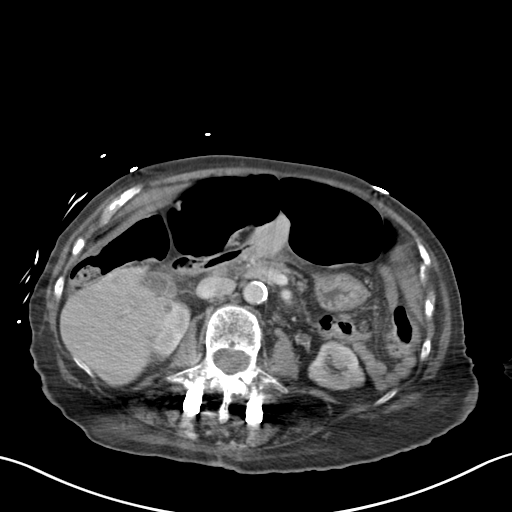
[im 57/85  bone]
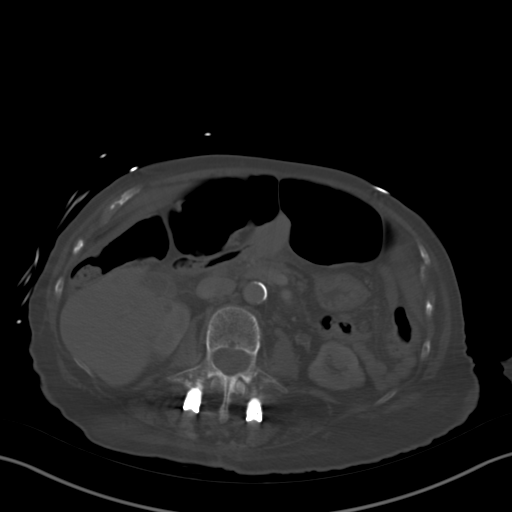
[im 68/85  soft-tissue]
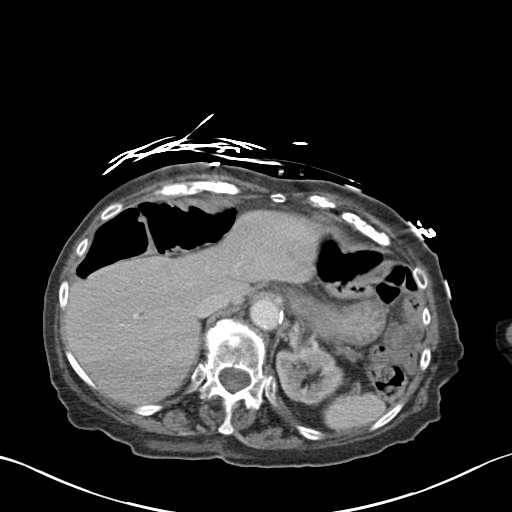
[im 73/85  soft-tissue]
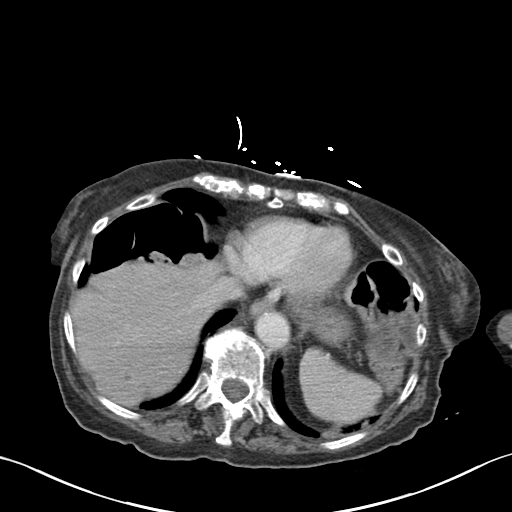
[im 79/85  soft-tissue]
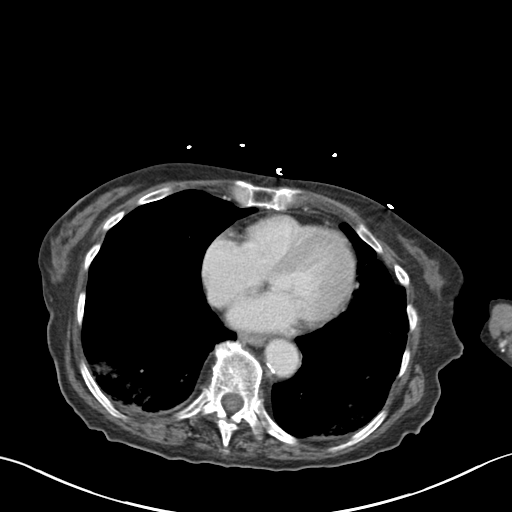

[Series 5: coronal a/|p · coronal · 0.70mm/px · 3 of 127 slices shown]
[im 43/127  soft-tissue]
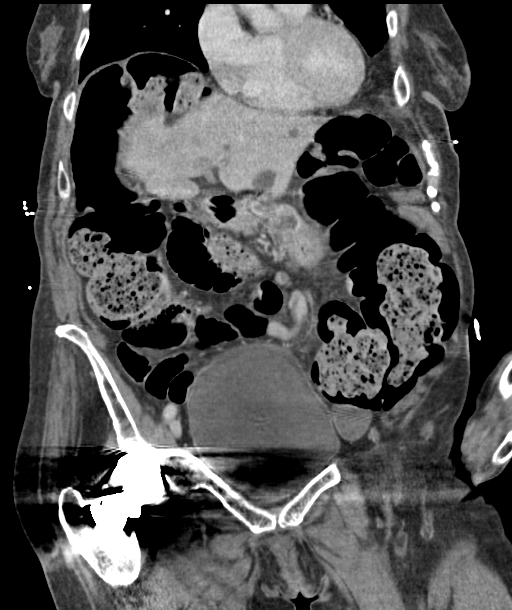
[im 57/127  soft-tissue]
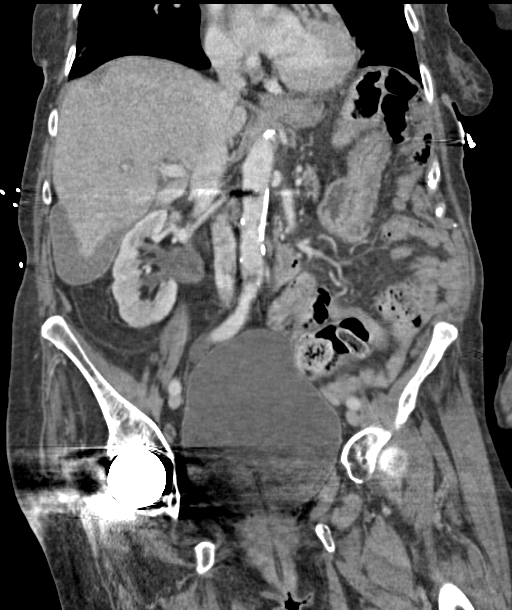
[im 71/127  soft-tissue]
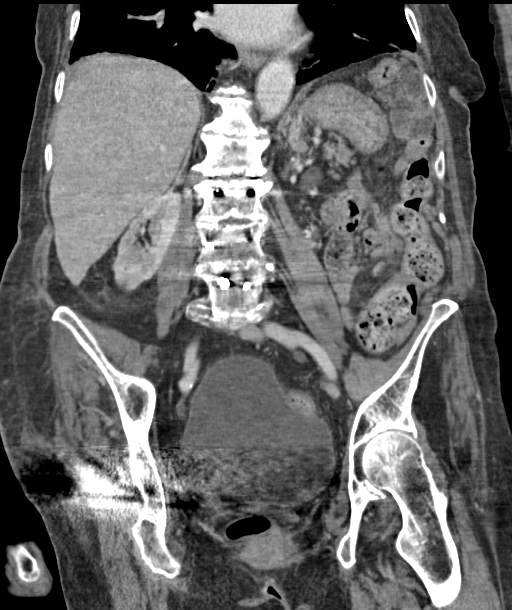

[15 of 46 positions shown; findings below may reference images not displayed]

FINDINGS: Lower chest: Bibasilar atelectasis. No effusion or pneumothorax. Top
normal size cardiac chambers. No pericardial effusion.

Hepatobiliary: Colonic interposition over the liver is noted. There
are least half a dozen or so well-circumscribed hypodensities
scattered throughout the liver, the largest in the left hepatic lobe
measuring 17 mm. These do not appear to enhance nor filled in on
delayed repeat images through the upper abdomen suggesting hepatic
cysts. No significant progression in size or number since prior. The
gallbladder is free of stones. No wall thickening noted. No biliary
dilatation.

Pancreas: Atrophic pancreas without ductal dilatation.

Spleen: No splenomegaly.

Adrenals/Urinary Tract: Bilateral extrarenal pelves with chronic
mild ectasia of the right renal collecting system. Tiny cortical
cysts of both kidneys. There is mild ectasia of the right ureter to
the level bladder. In the pelvis, the right distal ureter has a
hyperdense appearance of (series 2 images 65 through 68 which may be
due to streak artifacts from the patient's right bipolar hip
arthroplasty. Alternatively intraluminal clots could have this
appearance however no hyperdensities are seen within the distended
bladder to suggest hemorrhage. The bladder is distended and contains
a Foley catheter with balloon which may explain the air-fluid level
seen within probably from instrumentation.

Stomach/Bowel: Moderate colonic stool burden with gaseous distention
of large bowel low without wall thickening or evidence of
obstruction. Appendectomy.

Vascular/Lymphatic: Aortic atherosclerosis. No enlarged abdominal or
pelvic lymph nodes. Two calcified mesenteric densities just lateral
to the right ureter, series 2 image [DATE] represent calcified lymph
nodes, measuring up to 6 mm short axis.

Reproductive: Hysterectomy.

Other: No ascites.  No abdominal wall hernia.

Musculoskeletal: Lumbar fusion hardware noted from L3 through S1. No
acute osseous abnormality
IMPRESSION: Moderate fecal retention within large bowel without obstruction. No
acute bowel inflammation.

Distended bladder with Foley catheter seen within likely explain the
large air-fluid level noted within bladder.

Stable postop lumbar fusion and right hip arthroplasty.

Stable hepatic and bilateral renal cysts.

## 2018-05-30 IMAGING — CT CT HEAD CODE STROKE
3 of 4 series · 15 of 47 positions shown, 18 images · non-contrast
Comparison: None.

CLINICAL DATA: Code stroke.  Left-sided weakness.

EXAM:
CT HEAD WITHOUT CONTRAST
TECHNIQUE: Contiguous axial images were obtained from the base of the skull
through the vertex without intravenous contrast.

[Series 2: head w/o · axial · non-contrast · 0.43mm/px · z∈[-168,-48]mm · 9 of 29 slices shown, 12 images]
[im 3/29  brain]
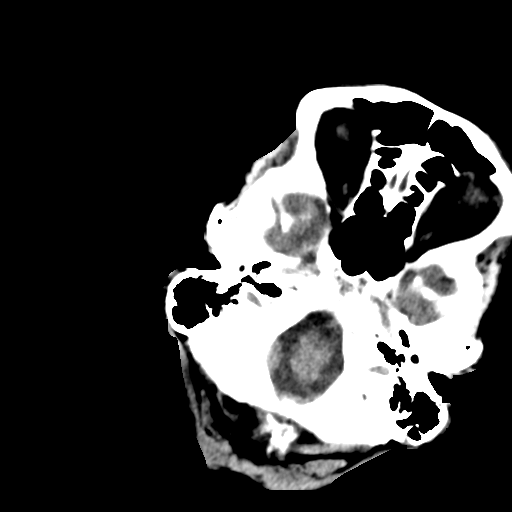
[im 3/29  bone]
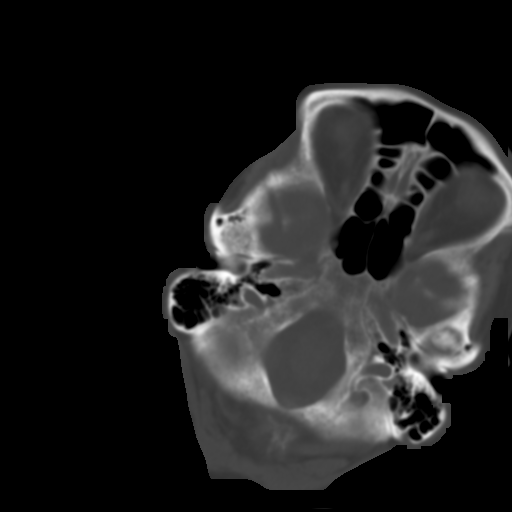
[im 7/29  brain]
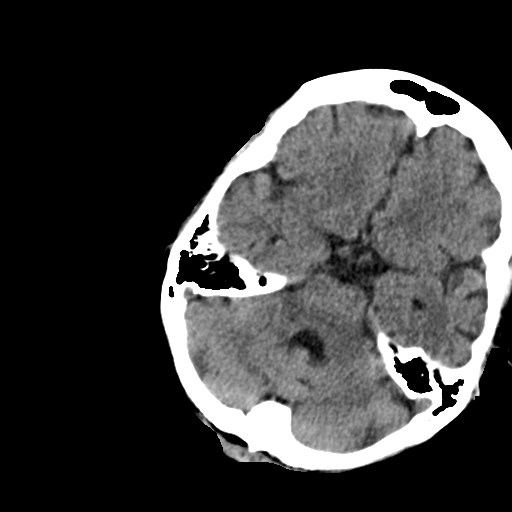
[im 9/29  brain]
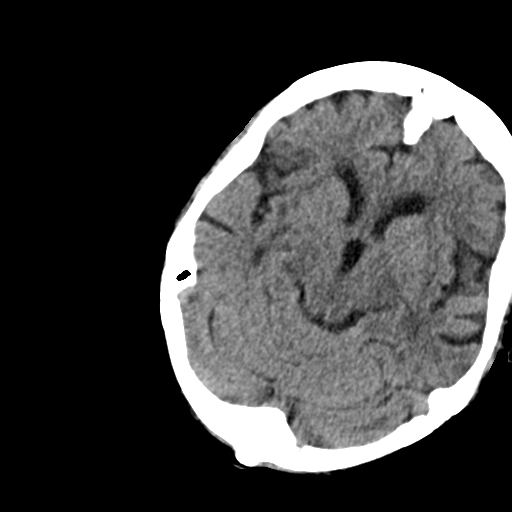
[im 13/29  brain]
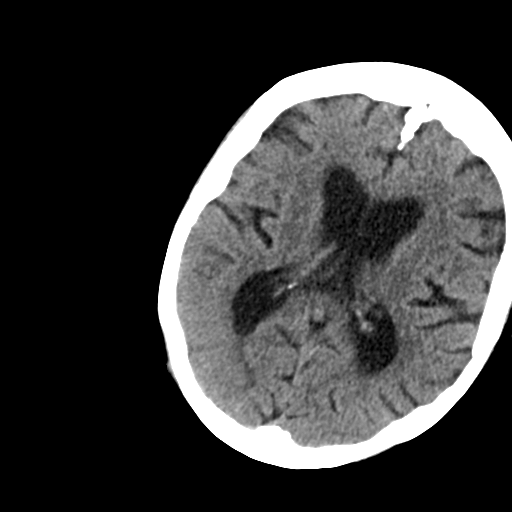
[im 15/29  brain]
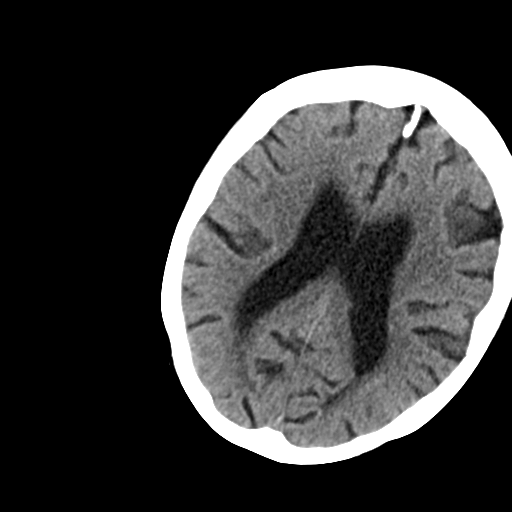
[im 15/29  bone]
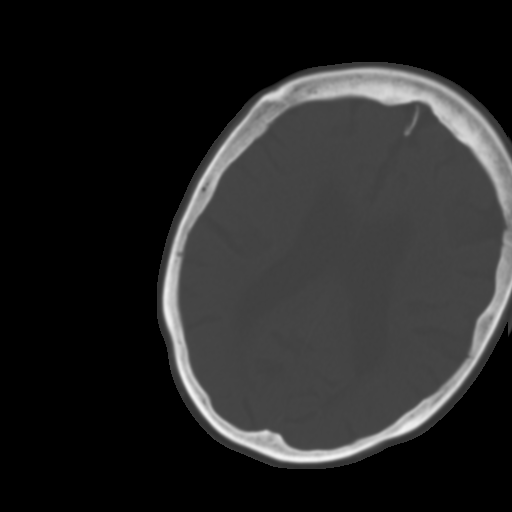
[im 17/29  brain]
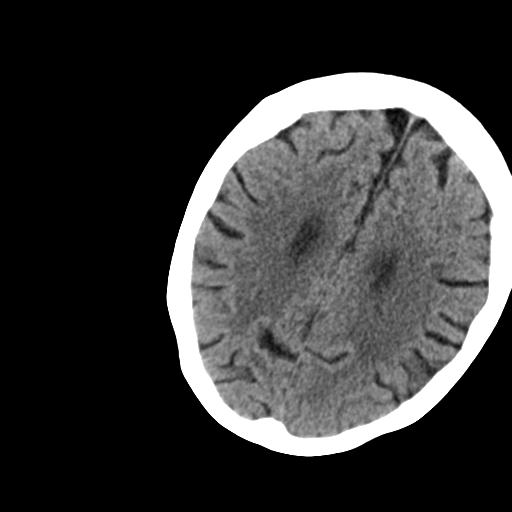
[im 21/29  brain]
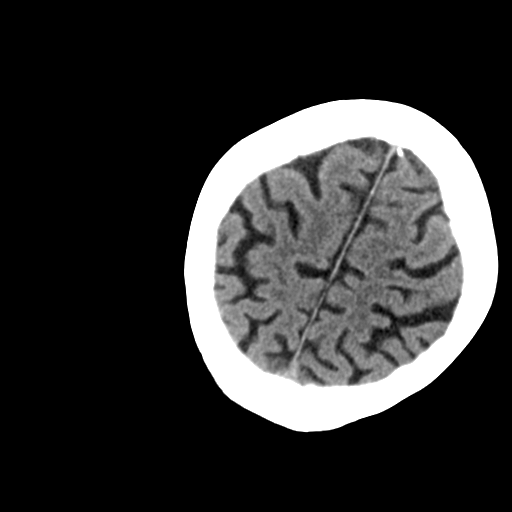
[im 23/29  brain]
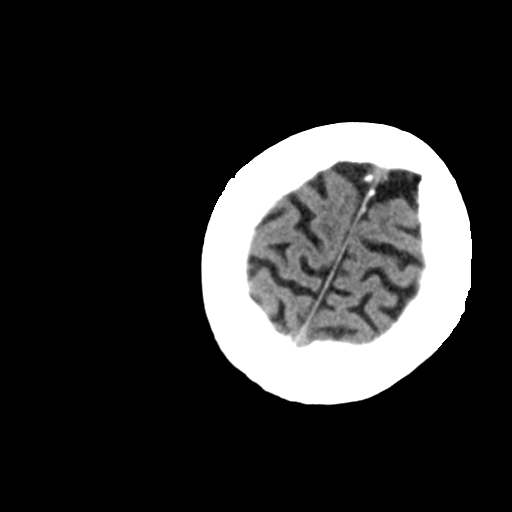
[im 27/29  brain]
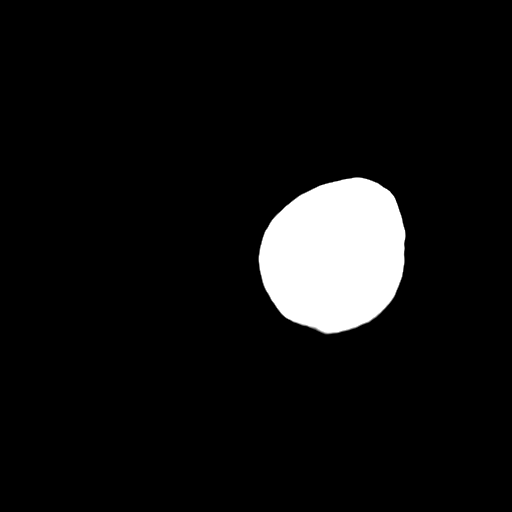
[im 27/29  bone]
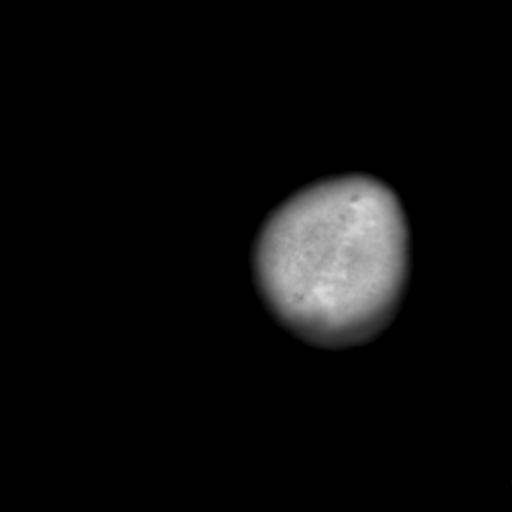

[Series 5: coronal · coronal · 0.26mm/px · 3 of 68 slices shown]
[im 23/68  brain]
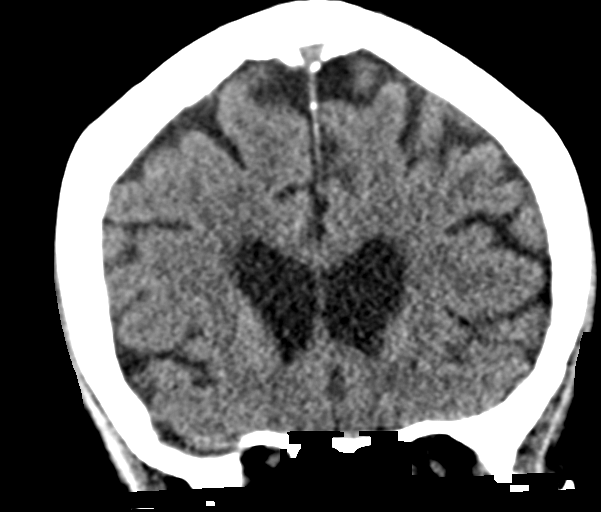
[im 30/68  brain]
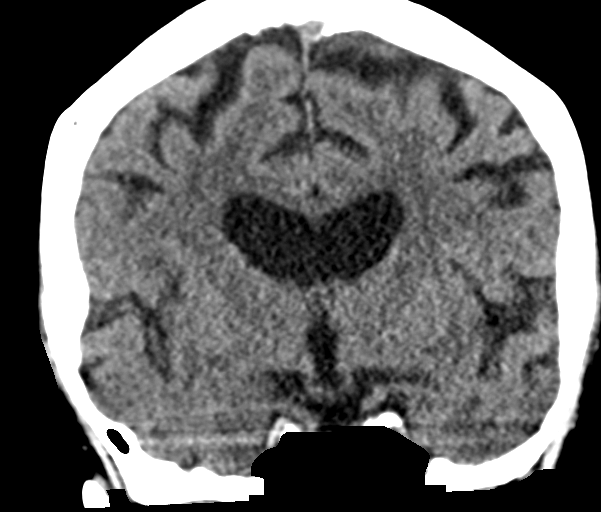
[im 38/68  brain]
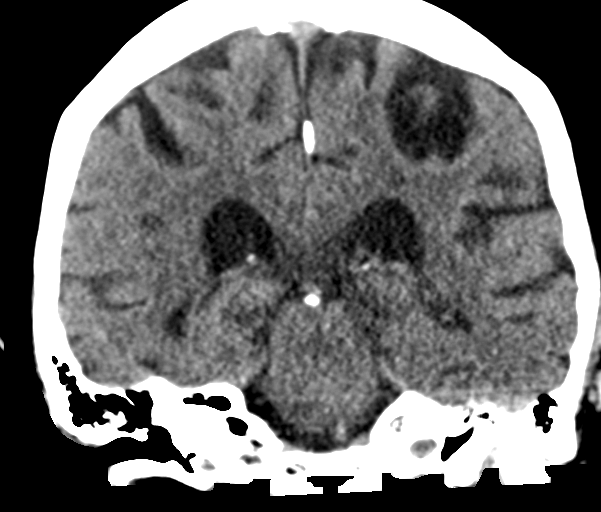

[Series 6: sagittal · sagittal · 0.27mm/px · 3 of 51 slices shown]
[im 17/51  brain]
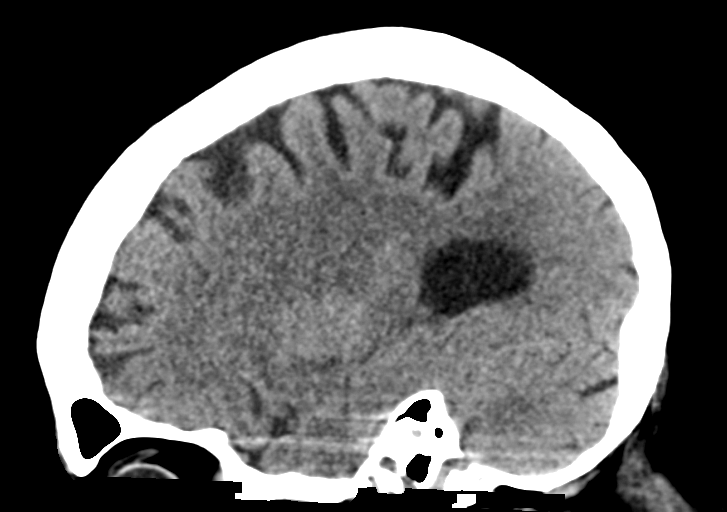
[im 26/51  brain]
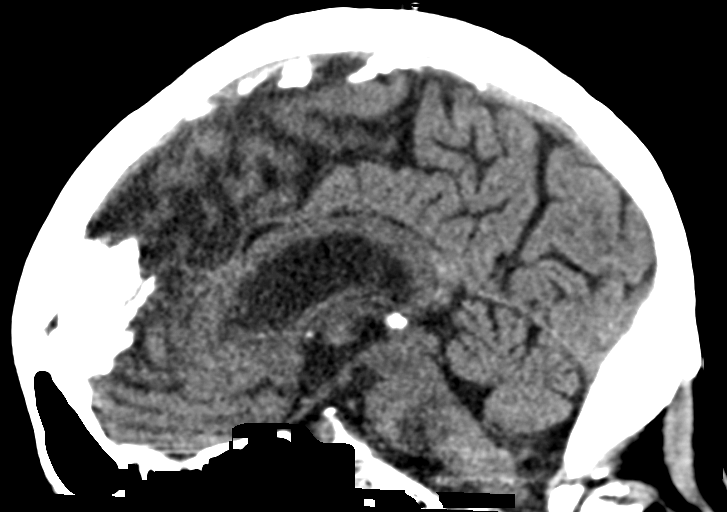
[im 34/51  brain]
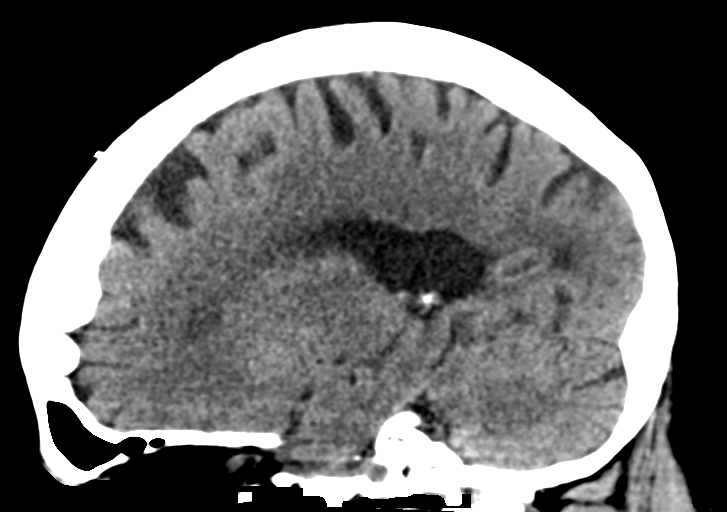

[15 of 47 positions shown; findings below may reference images not displayed]

FINDINGS: Brain: No evidence of acute infarction, hemorrhage, hydrocephalus,
extra-axial collection or mass lesion/mass effect. Mild chronic
microvascular ischemic changes and parenchymal volume loss.

Vascular: No hyperdense vessel. Calcific atherosclerosis of
cavernous internal carotid arteries per

Skull: Normal. Negative for fracture or focal lesion.

Sinuses/Orbits: No acute finding.

Other: None.

ASPECTS (Alberta Stroke Program Early CT Score)

- Ganglionic level infarction (caudate, lentiform nuclei, internal
capsule, insula, M1-M3 cortex): 7

- Supraganglionic infarction (M4-M6 cortex): 3

Total score (0-10 with 10 being normal): 10
IMPRESSION: 1. No acute intracranial abnormality is identified. If symptoms
persist or if clinically indicated MRI is more sensitive for acute
stroke.
2. ASPECTS is 10
These results were called by telephone at the time of interpretation
on 06/29/2016 at [DATE] to PA Kiroro , who verbally acknowledged
these results.

By: Auder Bracamonte M.D.
